# Patient Record
Sex: Female | Born: 1955
Health system: Southern US, Community
[De-identification: ages and names within clinical notes are randomized; demographics above are authoritative.]

## PROBLEM LIST (undated history)

## (undated) DIAGNOSIS — D693 Immune thrombocytopenic purpura: Secondary | ICD-10-CM

## (undated) DIAGNOSIS — E079 Disorder of thyroid, unspecified: Secondary | ICD-10-CM

## (undated) DIAGNOSIS — I2510 Atherosclerotic heart disease of native coronary artery without angina pectoris: Secondary | ICD-10-CM

## (undated) DIAGNOSIS — G4730 Sleep apnea, unspecified: Secondary | ICD-10-CM

## (undated) DIAGNOSIS — D124 Benign neoplasm of descending colon: Secondary | ICD-10-CM

## (undated) DIAGNOSIS — D123 Benign neoplasm of transverse colon: Secondary | ICD-10-CM

## (undated) DIAGNOSIS — D125 Benign neoplasm of sigmoid colon: Secondary | ICD-10-CM

## (undated) DIAGNOSIS — K219 Gastro-esophageal reflux disease without esophagitis: Secondary | ICD-10-CM

## (undated) DIAGNOSIS — R194 Change in bowel habit: Secondary | ICD-10-CM

## (undated) DIAGNOSIS — I10 Essential (primary) hypertension: Secondary | ICD-10-CM

## (undated) DIAGNOSIS — Z9289 Personal history of other medical treatment: Secondary | ICD-10-CM

## (undated) DIAGNOSIS — T884XXA Failed or difficult intubation, initial encounter: Secondary | ICD-10-CM

## (undated) DIAGNOSIS — M797 Fibromyalgia: Secondary | ICD-10-CM

## (undated) DIAGNOSIS — J189 Pneumonia, unspecified organism: Secondary | ICD-10-CM

## (undated) DIAGNOSIS — Z860100 Personal history of colon polyps, unspecified: Secondary | ICD-10-CM

## (undated) DIAGNOSIS — D120 Benign neoplasm of cecum: Secondary | ICD-10-CM

## (undated) DIAGNOSIS — I351 Nonrheumatic aortic (valve) insufficiency: Secondary | ICD-10-CM

## (undated) DIAGNOSIS — R519 Headache, unspecified: Secondary | ICD-10-CM

## (undated) DIAGNOSIS — Z8719 Personal history of other diseases of the digestive system: Secondary | ICD-10-CM

## (undated) DIAGNOSIS — E119 Type 2 diabetes mellitus without complications: Secondary | ICD-10-CM

## (undated) DIAGNOSIS — R011 Cardiac murmur, unspecified: Secondary | ICD-10-CM

## (undated) DIAGNOSIS — T8859XA Other complications of anesthesia, initial encounter: Secondary | ICD-10-CM

## (undated) DIAGNOSIS — M1990 Unspecified osteoarthritis, unspecified site: Secondary | ICD-10-CM

## (undated) DIAGNOSIS — S0990XA Unspecified injury of head, initial encounter: Secondary | ICD-10-CM

## (undated) DIAGNOSIS — E039 Hypothyroidism, unspecified: Secondary | ICD-10-CM

## (undated) DIAGNOSIS — D649 Anemia, unspecified: Secondary | ICD-10-CM

## (undated) DIAGNOSIS — Z87442 Personal history of urinary calculi: Secondary | ICD-10-CM

## (undated) DIAGNOSIS — D122 Benign neoplasm of ascending colon: Secondary | ICD-10-CM

## (undated) DIAGNOSIS — H269 Unspecified cataract: Secondary | ICD-10-CM

## (undated) DIAGNOSIS — I509 Heart failure, unspecified: Secondary | ICD-10-CM

## (undated) HISTORY — DX: Essential (primary) hypertension: I10

## (undated) HISTORY — DX: Gastro-esophageal reflux disease without esophagitis: K21.9

## (undated) HISTORY — DX: Benign neoplasm of sigmoid colon: D12.5

## (undated) HISTORY — PX: EYE SURGERY: SHX253

## (undated) HISTORY — PX: TONSILLECTOMY: SUR1361

## (undated) HISTORY — DX: Personal history of colon polyps, unspecified: Z86.0100

## (undated) HISTORY — DX: Benign neoplasm of descending colon: D12.4

## (undated) HISTORY — PX: FOOT SURGERY: SHX648

## (undated) HISTORY — DX: Disorder of thyroid, unspecified: E07.9

## (undated) HISTORY — DX: Benign neoplasm of ascending colon: D12.2

## (undated) HISTORY — DX: Unspecified cataract: H26.9

## (undated) HISTORY — DX: Benign neoplasm of transverse colon: D12.3

## (undated) HISTORY — DX: Unspecified osteoarthritis, unspecified site: M19.90

## (undated) HISTORY — DX: Change in bowel habit: R19.4

## (undated) HISTORY — PX: OTHER SURGICAL HISTORY: SHX169

## (undated) HISTORY — PX: ABDOMINAL HYSTERECTOMY: SHX81

## (undated) HISTORY — DX: Type 2 diabetes mellitus without complications: E11.9

## (undated) HISTORY — DX: Benign neoplasm of cecum: D12.0

## (undated) HISTORY — PX: PLANTAR FASCIA SURGERY: SHX746

## (undated) HISTORY — DX: Personal history of colonic polyps: Z86.010

## (undated) HISTORY — DX: Anemia, unspecified: D64.9

## (undated) HISTORY — PX: CHOLECYSTECTOMY: SHX55

## (undated) HISTORY — PX: APPENDECTOMY: SHX54

## (undated) HISTORY — PX: FRACTURE SURGERY: SHX138

---

## 1998-07-08 ENCOUNTER — Other Ambulatory Visit: Admission: RE | Admit: 1998-07-08 | Discharge: 1998-07-08 | Payer: Self-pay | Admitting: Obstetrics & Gynecology

## 1998-08-24 ENCOUNTER — Ambulatory Visit (HOSPITAL_BASED_OUTPATIENT_CLINIC_OR_DEPARTMENT_OTHER): Admission: RE | Admit: 1998-08-24 | Discharge: 1998-08-24 | Payer: Self-pay | Admitting: Orthopedic Surgery

## 2001-04-08 ENCOUNTER — Other Ambulatory Visit: Admission: RE | Admit: 2001-04-08 | Discharge: 2001-04-08 | Payer: Self-pay | Admitting: Obstetrics & Gynecology

## 2001-04-12 ENCOUNTER — Encounter: Payer: Self-pay | Admitting: Obstetrics & Gynecology

## 2001-04-12 ENCOUNTER — Encounter: Admission: RE | Admit: 2001-04-12 | Discharge: 2001-04-12 | Payer: Self-pay | Admitting: Obstetrics & Gynecology

## 2001-12-30 ENCOUNTER — Encounter: Payer: Self-pay | Admitting: Internal Medicine

## 2002-08-19 ENCOUNTER — Other Ambulatory Visit: Admission: RE | Admit: 2002-08-19 | Discharge: 2002-08-19 | Payer: Self-pay | Admitting: Obstetrics & Gynecology

## 2003-12-16 ENCOUNTER — Encounter: Payer: Self-pay | Admitting: Internal Medicine

## 2005-01-27 ENCOUNTER — Ambulatory Visit: Payer: Self-pay | Admitting: Internal Medicine

## 2005-02-03 ENCOUNTER — Ambulatory Visit: Payer: Self-pay | Admitting: Internal Medicine

## 2005-02-10 ENCOUNTER — Ambulatory Visit: Payer: Self-pay | Admitting: Internal Medicine

## 2005-02-10 ENCOUNTER — Encounter (INDEPENDENT_AMBULATORY_CARE_PROVIDER_SITE_OTHER): Payer: Self-pay | Admitting: Specialist

## 2005-07-21 ENCOUNTER — Other Ambulatory Visit: Admission: RE | Admit: 2005-07-21 | Discharge: 2005-07-21 | Payer: Self-pay | Admitting: Obstetrics and Gynecology

## 2010-01-10 ENCOUNTER — Encounter (INDEPENDENT_AMBULATORY_CARE_PROVIDER_SITE_OTHER): Payer: Self-pay | Admitting: *Deleted

## 2010-03-01 ENCOUNTER — Ambulatory Visit: Payer: Self-pay | Admitting: Internal Medicine

## 2010-03-16 ENCOUNTER — Telehealth: Payer: Self-pay | Admitting: Internal Medicine

## 2010-04-25 ENCOUNTER — Telehealth: Payer: Self-pay | Admitting: Internal Medicine

## 2010-04-26 ENCOUNTER — Encounter: Payer: Self-pay | Admitting: Internal Medicine

## 2010-05-06 ENCOUNTER — Encounter (INDEPENDENT_AMBULATORY_CARE_PROVIDER_SITE_OTHER): Payer: Self-pay | Admitting: *Deleted

## 2010-05-09 ENCOUNTER — Ambulatory Visit: Payer: Self-pay | Admitting: Internal Medicine

## 2010-05-13 ENCOUNTER — Ambulatory Visit: Payer: Self-pay | Admitting: Internal Medicine

## 2010-05-13 ENCOUNTER — Ambulatory Visit (HOSPITAL_COMMUNITY)
Admission: RE | Admit: 2010-05-13 | Discharge: 2010-05-13 | Payer: Self-pay | Source: Home / Self Care | Attending: Internal Medicine | Admitting: Internal Medicine

## 2010-05-16 ENCOUNTER — Encounter: Payer: Self-pay | Admitting: Internal Medicine

## 2010-07-05 NOTE — Procedures (Signed)
Summary: EGD   EGD  Procedure date:  12/16/2003  Findings:      Location: [facility]  Findings: Esophagitis  Findings: Stricture:  GERD   EGD  Procedure date:  12/16/2003  Findings:      Location: [facility]  Findings: Esophagitis  Findings: Stricture:  GERD  Patient Name: Joanna Alvarado, Joanna Alvarado MRN:  Procedure Procedures: Panendoscopy (EGD) CPT: 43235.    with esophageal dilation. CPT: G9296129.  Personnel: Endoscopist: Wilhemina Bonito. Marina Goodell, MD.  Exam Location: Exam performed in Outpatient Clinic. Outpatient  Patient Consent: Procedure, Alternatives, Risks and Benefits discussed, consent obtained, from patient. Consent was obtained by the RN.  Indications Symptoms: Dysphagia. Reflux symptoms  History  Current Medications: Patient is not currently taking Coumadin.  Pre-Exam Physical: Performed Dec 16, 2003  Entire physical exam was normal.  Exam Exam Info: Maximum depth of insertion Duodenum, intended Duodenum. Patient position: on left side. Vocal cords visualized. Gastric retroflexion performed. Images taken. ASA Classification: I. Tolerance: excellent.  Sedation Meds: Patient assessed and found to be appropriate for moderate (conscious) sedation. Fentanyl 100 mcg. given IV. Versed 10 mg. given IV. Cetacaine Spray given aerosolized.  Monitoring: BP and pulse monitoring done. Oximetry used. Supplemental O2 given  Fluoroscopy: Fluoroscopy was not used.  Findings STRICTURE / STENOSIS: Stricture in Distal Esophagus.  Constriction: partial. Etiology: benign due to reflux. 34 cm from mouth. Lumen diameter is 15 mm. ICD9: Esophageal Stricture: 530.3. Comment: Active esophagitis (erosive) .  - Dilation: Distal Esophagus. Maloney dilator used, Diameter: 54 F, No Resistance, No Heme present on extraction. 1  total dilators used. Patient tolerance excellent.  HIATAL HERNIA: Regular, 5 cms. in length.   Assessment Abnormal examination, see  findings above.  Diagnoses: 530.3: Esophageal Stricture.  530.11: Esophagitis, Reflux.  530.81: GERD.   Events  Unplanned Intervention: No unplanned interventions were required.  Unplanned Events: There were no complications. Plans Medication(s): PPI: Esomeprazole/Nexium 40 mg QD, for indefinitely.   Disposition: After procedure patient sent to recovery. After recovery patient sent home.  Scheduling: Office Visit, to [employer]. Marina Goodell, MD, in about 8 weeks    This report was created from the original endoscopy report, which was reviewed and signed by the above listed endoscopist.   cc:  The Patient

## 2010-07-05 NOTE — Miscellaneous (Signed)
Summary: recall colon--ch.  Clinical Lists Changes    Patient will have to be done at the hospital due to her weight.  Patty to schedule when Dr. Lamar Sprinkles new sschedule comes out.  Clide Cliff, RN

## 2010-07-05 NOTE — Progress Notes (Signed)
Summary: Needs colonoscopy at hospital  Phone Note Call from Patient Call back at [phone]   Caller: Patient Call For: Dr. Marina Goodell Reason for Call: Talk to Nurse Summary of Call: needs to sch'd hosp. COL Initial call taken by: Karna Christmas,  March 16, 2010 2:18 PM  Follow-up for Phone Call        Dr Marina Goodell this pt had a previsit but the colon will need to be done at [facility] due to her weight.   Can I put her on 03/31/10? Follow-up by: Chales Abrahams CMA Duncan Dull),  March 16, 2010 2:34 PM  Additional Follow-up for Phone Call Additional follow up Details #1::        no, my schedule this too full that week. Look toward by next hospital week, I think in December. Thanks Additional Follow-up by: Hilarie Fredrickson MD,  March 16, 2010 3:10 PM    Additional Follow-up for Phone Call Additional follow up Details #2::    pt aware I will call her when the Dec schedule is out. Follow-up by: Chales Abrahams CMA Duncan Dull),  March 16, 2010 3:16 PM

## 2010-07-05 NOTE — Progress Notes (Signed)
  Phone Note Outgoing Call   Call placed by: Milford Cage [place],  April 25, 2010 4:24 PM Call placed to: Patient Summary of Call: Called patient to see if December 9th is ok to schedule her Colon.  Left message for her to call me back. Milford Cage [facility]  April 25, 2010 4:24 PM   Follow-up for Phone Call        Pt. called and stated Dec. 9th is fine.  Scheduled at [facility] and pre-visit for Dec. 5th.  reminder letter sent.  Follow-up by: Milford Cage NCMA,  April 26, 2010 12:19 PM

## 2010-07-05 NOTE — Letter (Signed)
Summary: [facility] Instructions  [facility]  [address] [place], [place] [address]   Phone: [phone]  Fax: [phone]       Joanna Alvarado    Feb 05, 1956    MRN: [id number]        Procedure Day Dorna Bloom:  Farrell Ours  05/13/10     Arrival Time:  7:30AM     Procedure Time:  8:30AM     Location of Procedure:                    Juliann Pares  [facility] ( Outpatient Registration)                      PREPARATION FOR COLONOSCOPY WITH MOVIPREP   Starting 5 days prior to your procedure 05/08/10 do not eat nuts, seeds, popcorn, corn, beans, peas,  salads, or any raw vegetables.  Do not take any fiber supplements (e.g. Metamucil, Citrucel, and Benefiber).  THE DAY BEFORE YOUR PROCEDURE         DATE: 05/12/10   DAY: THURSDAY  1.  Drink clear liquids the entire day-NO SOLID FOOD  2.  Do not drink anything colored red or purple.  Avoid juices with pulp.  No orange juice.  3.  Drink at least 64 oz. (8 glasses) of fluid/clear liquids during the day to prevent dehydration and help the prep work efficiently.  CLEAR LIQUIDS INCLUDE: Water Jello Ice Popsicles Tea (sugar ok, no milk/cream) Powdered fruit flavored drinks Coffee (sugar ok, no milk/cream) Gatorade Juice: apple, white grape, white cranberry  Lemonade Clear bullion, consomm, broth Carbonated beverages (any kind) Strained chicken noodle soup Hard Candy                             4.  In the morning, mix first dose of MoviPrep solution:    Empty 1 Pouch A and 1 Pouch B into the disposable container    Add lukewarm drinking water to the top line of the container. Mix to dissolve    Refrigerate (mixed solution should be used within 24 hrs)  5.  Begin drinking the prep at 5:00 p.m. The MoviPrep container is divided by 4 marks.   Every 15 minutes drink the solution down to the next mark (approximately 8 oz) until the full liter is complete.   6.  Follow completed prep with 16 oz of clear liquid of your  choice (Nothing red or purple).  Continue to drink clear liquids until bedtime.  7.  Before going to bed, mix second dose of MoviPrep solution:    Empty 1 Pouch A and 1 Pouch B into the disposable container    Add lukewarm drinking water to the top line of the container. Mix to dissolve    Refrigerate  THE DAY OF YOUR PROCEDURE      DATE: 05/13/10   DAY: FRIDAY  Beginning at 3:30AM (5 hours before procedure):         1. Every 15 minutes, drink the solution down to the next mark (approx 8 oz) until the full liter is complete.  2. Follow completed prep with 16 oz. of clear liquid of your choice.    3. You may drink clear liquids until 4:30AM (4 HOURS BEFORE PROCEDURE).   MEDICATION INSTRUCTIONS  Unless otherwise instructed, you should take regular prescription medications with a small sip of water   as early as possible the  morning of your procedure.         OTHER INSTRUCTIONS  You will need a responsible adult at least 56 years of age to accompany you and drive you home.   This person must remain in the waiting room during your procedure.  Wear loose fitting clothing that is easily removed.  Leave jewelry and other valuables at home.  However, you may wish to bring a book to read or  an iPod/MP3 player to listen to music as you wait for your procedure to start.  Remove all body piercing jewelry and leave at home.  Total time from sign-in until discharge is approximately 2-3 hours.  You should go home directly after your procedure and rest.  You can resume normal activities the  day after your procedure.  The day of your procedure you should not:   Drive   Make legal decisions   Operate machinery   Drink alcohol   Return to work  You will receive specific instructions about eating, activities and medications before you leave.    The above instructions have been reviewed and explained to me by   Clide Cliff, RN______________________    I fully  understand and can verbalize these instructions _____________________________ Date _________

## 2010-07-05 NOTE — Procedures (Signed)
Summary: Colonoscopy   Colonoscopy  Procedure date:  12/30/2001  Findings:      Location:  [facility].  Results: Polyp.  Results: Hemorrhoids.     Results: Diverticulosis.         Procedures Next Due Date:    Colonoscopy: 01/2005 Patient Name: Joanna Alvarado, Joanna Alvarado MRN:  Procedure Procedures: Colonoscopy CPT: [address].    with polypectomy. CPT: A3573898.  Personnel: Endoscopist: Wilhemina Bonito. Marina Goodell, MD.  Referred By: Hall Busing, MD.  Exam Location: Exam performed in Outpatient Clinic. Outpatient  Patient Consent: Procedure, Alternatives, Risks and Benefits discussed, consent obtained, from patient. Consent was obtained by the RN.  Indications Symptoms: Hematochezia.  Increased Risk Screening: Family History of Polyps.  History  Pre-Exam Physical: Performed Dec 30, 2001. Entire physical exam was normal.  Exam Exam: Extent of exam reached: Ileum, extent intended: Ileum.  The cecum was identified by appendiceal orifice and IC valve. Patient position: on left side. Colon retroflexion performed. Images taken. ASA Classification: I. Tolerance: excellent.  Monitoring: Pulse and BP monitoring, Oximetry used. Supplemental O2 given.  Colon Prep Used Visicol for colon prep. Prep results: good.  Sedation Meds: Patient assessed and found to be appropriate for moderate (conscious) sedation. Fentanyl 125 mcg. given IV. Versed 12 given IV.  Findings POLYP: Transverse Colon, Maximum size: 3 mm. sessile polyp. Procedure:  snare without cautery, removed, not retrieved,  POLYP: Ascending Colon, Maximum size: 7 mm. sessile polyp. Procedure:  snare with cautery, The polyp was removed piece meal. removed, retrieved, Polyp sent to pathology.  - DIVERTICULOSIS: Sigmoid Colon. ICD9: Diverticulosis, Colon: 562.10.  POLYP: Sigmoid Colon, Maximum size: 4 mm. sessile polyp. Procedure:  snare with cautery, removed, not retrieved, Comments: polyp destroyed upon removal.    HEMORRHOIDS: Internal. Size: Grade II. ICD9: Hemorrhoids, Internal: 455.0.   Assessment Abnormal examination, see findings above.  Diagnoses: 562.10: Diverticulosis, Colon.  455.0: Hemorrhoids, Internal.  211.3: Colon Polyps.   Events  Unplanned Interventions: No intervention was required.  Unplanned Events: There were no complications. Plans Medication Plan: Fiber supplements: Psyllium 2 Tbsp QD, starting Dec 30, 2001  Hemorrhoidal Medications: Anusol Suppositories qhs prn   Disposition: After procedure patient sent to recovery. After recovery patient sent home.  Scheduling/Referral: Colonoscopy, to Wilhemina Bonito. Marina Goodell, MD, in 3 years,    This report was created from the original endoscopy report, which was reviewed and signed by the above listed endoscopist.   cc:  Hall Busing, MD      The Patient

## 2010-07-05 NOTE — Miscellaneous (Signed)
Summary: colonoscopy/[facility] 05/13/10/bs  Clinical Lists Changes  Medications: Added new medication of MOVIPREP 100 GM  SOLR (PEG-KCL-NACL-NASULF-NA ASC-C) As per prep instructions. - Signed Rx of MOVIPREP 100 GM  SOLR (PEG-KCL-NACL-NASULF-NA ASC-C) As per prep instructions.;  #1 x 0;  Signed;  Entered by: Clide Cliff RN;  Authorized by: Hilarie Fredrickson MD;  Method used: Electronically to [facility]  [id number], [place], [place]  [address], Ph: [phone] or [phone], Fax: [phone] Allergies: Added new allergy or adverse reaction of CODEINE Observations: Added new observation of NKA: F (05/09/2010 13:33)    Prescriptions: MOVIPREP 100 GM  SOLR (PEG-KCL-NACL-NASULF-NA ASC-C) As per prep instructions.  #1 x 0   Entered by:   Clide Cliff RN   Authorized by:   Hilarie Fredrickson MD   Signed by:   Clide Cliff RN on 05/09/2010   Method used:   [facility]ically to        [facility]  [id number] (retail)       [address]       [place], [place]  [address]       Ph: [phone] or [phone]       Fax: [phone]   RxID:   [id number]

## 2010-07-05 NOTE — Letter (Signed)
Summary: Previsit letter  [facility]  [address] [place], [place] [address]   Phone: [phone]  Fax: [phone]       01/10/2010 MRN: [id number]  [facility] [address] [place], [place]  [address]  Dear [name],  Welcome to the Gastroenterology Division at [facility].    You are scheduled to see a nurse for your pre-procedure visit on 03-01-10 at 10:00a.m. on the 3rd floor at [facility], [address].  We ask that you try to arrive at our office 15 minutes prior to your appointment time to allow for check-in.  Your nurse visit will consist of discussing your medical and surgical history, your immediate family medical history, and your medications.    Please bring a complete list of all your medications or, if you prefer, bring the medication bottles and we will list them.  We will need to be aware of both prescribed and over the counter drugs.  We will need to know exact dosage information as well.  If you are on blood thinners (Coumadin, Plavix, Aggrenox, Ticlid, etc.) please call our office today/prior to your appointment, as we need to consult with your physician about holding your medication.   Please be prepared to read and sign documents such as consent forms, a financial agreement, and acknowledgement forms.  If necessary, and with your consent, a friend or relative is welcome to sit-in on the nurse visit with you.  Please bring your insurance card so that we may make a copy of it.  If your insurance requires a referral to see a specialist, please bring your referral form from your primary care physician.  No co-pay is required for this nurse visit.     If you cannot keep your appointment, please call [phone] to cancel or reschedule prior to your appointment date.  This allows [place] the opportunity to schedule an appointment for another patient in need of care.    Thank you for choosing [facility] for your medical  needs.  We appreciate the opportunity to care for you.  Please visit [place] at our website  to learn more about our practice.                     Sincerely.                                                                                                                   The Gastroenterology Division

## 2010-07-05 NOTE — Procedures (Signed)
Summary: Colonoscopy   Colonoscopy  Procedure date:  02/10/2005  Findings:      Location:  [facility].  Results: Diverticulosis.       Results: Hemorrhoids.     Results: Polyp. Adenomatous and Hyperplastic  Patient Name: Joanna Alvarado, Joanna Alvarado MRN:  Procedure Procedures: Colonoscopy CPT: 5072027333.    with polypectomy. CPT: A3573898.  Personnel: Endoscopist: Wilhemina Bonito. Marina Goodell, MD.  Exam Location: Exam performed in Outpatient Clinic. Outpatient  Patient Consent: Procedure, Alternatives, Risks and Benefits discussed, consent obtained, from patient. Consent was obtained by the RN.  Indications Symptoms: Abdominal pain / bloating.  Surveillance of: Adenomatous Polyp(s). This is an initial surveillance exam. Initial polypectomy was performed in 2003. in Jul. 3 or more Polyps were found at Index Exam. Largest polyp removed was 6 to 9 mm. Prior polyp located in both proximal and distal colon. Pathology of worst  polyp: tubular adenoma.  History  Current Medications: Patient is not currently taking Coumadin.  Pre-Exam Physical: Performed Feb 10, 2005. Entire physical exam was normal.  Exam Exam: Extent of exam reached: Cecum, extent intended: Cecum.  The cecum was identified by appendiceal orifice and IC valve. Patient position: on left side. Colon retroflexion performed. Images taken. ASA Classification: I. Tolerance: excellent.  Monitoring: Pulse and BP monitoring, Oximetry used. Supplemental O2 given.  Colon Prep Used Miralax for colon prep. Prep results: excellent.  Sedation Meds: Patient assessed and found to be appropriate for moderate (conscious) sedation. Fentanyl 100 mcg. given IV. Versed 10 mg. given IV.  Findings - MULTIPLE POLYPS: Cecum to Ascending Colon. minimum size 3 mm, maximum size 15 mm. Procedure:  snare with cautery, removed, Polyp retrieved, 6 polyps Polyps sent to pathology. ICD9: Colon Polyps: 211.3. Comments: polyps 3,5,5,13mm cold snare.  10,59mm  w/cautery.  NORMAL EXAM: Cecum to Rectum.  - DIVERTICULOSIS: Sigmoid Colon. ICD9: Diverticulosis, Colon: 562.10.   Assessment Abnormal examination, see findings above.  Diagnoses: 562.10: Diverticulosis, Colon.  211.3: Colon Polyps.  455.0: Hemorrhoids, Internal. small.   Events  Unplanned Interventions: No intervention was required.  Unplanned Events: There were no complications. Plans  Post Exam Instructions: No aspirin or non-steroidal containing medications: 2 weeks.  Disposition: After procedure patient sent to recovery. After recovery patient sent home.  Scheduling/Referral: Colonoscopy, to Wilhemina Bonito. Marina Goodell, MD, in 2 years,    This report was created from the original endoscopy report, which was reviewed and signed by the above listed endoscopist.   cc:  Hall Busing, MD      The Patient

## 2010-07-05 NOTE — Procedures (Signed)
Summary: EGD   EGD  Procedure date:  02/10/2005  Findings:      Location: [facility]  Findings: Esophagitis  Findings: Stricture:  GERD  EGD  Procedure date:  02/10/2005  Findings:      Location: [facility]  Findings: Esophagitis  Findings: Stricture:  GERD Patient Name: Joanna Alvarado, Joanna Alvarado MRN:  Procedure Procedures: Panendoscopy (EGD) CPT: 43235.  Personnel: Endoscopist: Wilhemina Bonito. Marina Goodell, MD.  Exam Location: Exam performed in Outpatient Clinic. Outpatient  Patient Consent: Procedure, Alternatives, Risks and Benefits discussed, consent obtained, from patient. Consent was obtained by the RN.  Indications Symptoms: Abdominal pain, location: RUQ.  History  Current Medications: Patient is not currently taking Coumadin.  Pre-Exam Physical: Performed Feb 10, 2005  Entire physical exam was normal.  Exam Exam Info: Maximum depth of insertion Duodenum, intended Duodenum. Patient position: on left side. Vocal cords visualized. Gastric retroflexion performed. Images taken. ASA Classification: I. Tolerance: excellent.  Sedation Meds: Patient assessed and found to be appropriate for moderate (conscious) sedation. Residual sedation present from prior procedure today. Fentanyl 50 mcg. given IV. Versed 5 mg. given IV.  Monitoring: BP and pulse monitoring done. Oximetry used. Supplemental O2 given  Findings ESOPHAGEAL INFLAMMATION: as a result of reflux. Severity is moderate, erosions present.  Length of inflammation: 2 cm. [place] Classification: Grade A. ICD9: Esophagitis, Reflux: 530.11. Comments:  16mm stricture @ 37cm .  HIATAL HERNIA: 4 cms. in length.  - Normal: Fundus to Duodenal Apex.   Assessment Abnormal examination, see findings above.  Diagnoses: 530.11: Esophagitis, Reflux.  530.3: Esophageal Stricture.  530.81: GERD.   Events  Unplanned Intervention: No unplanned interventions were required.  Unplanned Events: There  were no complications. Plans Medication(s): PPI: Esomeprazole/Nexium 40 mg BID,   Disposition: After procedure patient sent to recovery. After recovery patient sent home.  Comments: Refer to General surgery "Symptomatic cholelithiasis, for lap chole"  This report was created from the original endoscopy report, which was reviewed and signed by the above listed endoscopist.   cc:  Hall Busing, MD      The Patient      CCS

## 2010-07-05 NOTE — Letter (Signed)
Summary: Pre Visit Letter Revised  [facility]  [address] [place], [place] [address]   Phone: [phone]  Fax: [phone]        04/26/2010 MRN: [id number] [facility] [address] [place], [place]  [address]             Procedure Date:  05/13/10 8:30 am                         Welcome to the Gastroenterology Division at [facility].    You are scheduled to see a nurse for your pre-procedure visit on 05/09/10 at 1:30 on the 3rd floor at [facility], [address].  We ask that you try to arrive at our office 15 minutes prior to your appointment time to allow for check-in.  Please take a minute to review the attached form.  If you answer "Yes" to one or more of the questions on the first page, we ask that you call the person listed at your earliest opportunity.  If you answer "No" to all of the questions, please complete the rest of the form and bring it to your appointment.    Your nurse visit will consist of discussing your medical and surgical history, your immediate family medical history, and your medications.   If you are unable to list all of your medications on the form, please bring the medication bottles to your appointment and we will list them.  We will need to be aware of both prescribed and over the counter drugs.  We will need to know exact dosage information as well.    Please be prepared to read and sign documents such as consent forms, a financial agreement, and acknowledgement forms.  If necessary, and with your consent, a friend or relative is welcome to sit-in on the nurse visit with you.  Please bring your insurance card so that we may make a copy of it.  If your insurance requires a referral to see a specialist, please bring your referral form from your primary care physician.  No co-pay is required for this nurse visit.     If you cannot keep your appointment, please call [phone] to cancel or reschedule prior to your  appointment date.  This allows [place] the opportunity to schedule an appointment for another patient in need of care.    Thank you for choosing [facility] for your medical needs.  We appreciate the opportunity to care for you.  Please visit [place] at our website  to learn more about our practice.  Sincerely, The Gastroenterology Division

## 2010-07-07 NOTE — Letter (Signed)
Summary: Patient Notice- Polyp Results  [facility]  [address] [place], [place] [address]   Phone: [phone]  Fax: [phone]        May 16, 2010 MRN: [id number]    [facility] [address] [place], [place]  [address]    Dear Ms. Bachmeier,  I am pleased to inform you that the colon polyp(s) removed during your recent colonoscopy was (were) found to be benign (no cancer detected) upon pathologic examination.  I recommend you have a repeat colonoscopy examination in 5 years to look for recurrent polyps, as having colon polyps increases your risk for having recurrent polyps or even colon cancer in the future.  Should you develop new or worsening symptoms of abdominal pain, bowel habit changes or bleeding from the rectum or bowels, please schedule an evaluation with either your primary care physician or with me.  Additional information/recommendations:  __ No further action with gastroenterology is needed at this time. Please      follow-up with your primary care physician for your other healthcare      needs.   Please call us if you are having persistent problems or have questions about your condition that have not been fully answered at this time.  Sincerely,  Hilarie Fredrickson MD  This letter has been electronically signed by your physician.  Appended Document: Patient Notice- Polyp Results letter mailed to patient's home

## 2010-07-07 NOTE — Procedures (Signed)
Summary: Colonoscopy  Patient: Joanna Alvarado Note: All result statuses are Final unless otherwise noted.  Tests: (1) Colonoscopy (COL)   COL Colonoscopy           DONE     [facility]     [address] [place], [place]  [address]           COLONOSCOPY PROCEDURE REPORT           PATIENT:  Joanna Alvarado, Joanna Alvarado  MR#:  [id number]     BIRTHDATE:  03-21-1956, 54 yrs. old  GENDER:  female     ENDOSCOPIST:  Wilhemina Bonito. Eda Keys, MD     REF. BY:  Surveillance Program Recall,     PROCEDURE DATE:  05/13/2010     PROCEDURE:  Colonoscopy with snare polypectomy x 3     ASA CLASS:  Class III     INDICATIONS:  history of pre-cancerous (adenomatous) colon polyps,     surveillance and high-risk screening ; index exam 2003 w/ mult.     TA's, f/u 2006 w/ mult TA's     MEDICATIONS:   Fentanyl 100 mcg IV, Versed 10 mg IV           DESCRIPTION OF PROCEDURE:   After the risks benefits and     alternatives of the procedure were thoroughly explained, informed     consent was obtained.  Digital rectal exam was performed and     revealed no abnormalities.   The Pentax Ped Colon [id number]     endoscope was introduced through the anus and advanced to the     cecum, which was identified by both the appendix and ileocecal     valve, without limitations.Time to cecum = 3 min. The quality of     the prep was excellent, using MoviPrep.  The instrument was then     slowly withdrawn (time = 15 min) as the colon was fully examined.     <<PROCEDUREIMAGES>>           FINDINGS:  Three polyps, < 6mm,  were found in the ascending     colon. Polyps were snared without cautery. Retrieval was     successful.   Mild diverticulosis was found in the left colon.     Retroflexed views in the rectum revealed no abnormalities.    The     scope was then withdrawn from the patient and the procedure     completed.           COMPLICATIONS:  None     ENDOSCOPIC IMPRESSION:     1) Three polyps in the ascending colon - removed     2) Mild diverticulosis in the left colon           RECOMMENDATIONS:     1) Follow up colonoscopy in 3 years if all adenomas; otherwise 5     years     ______________________________     Wilhemina Bonito. Eda Keys, MD           CC:  Ronney Asters, MD;   The Patient           n.     eSIGNED:   Wilhemina Bonito. Eda Keys at 05/13/2010 10:03 AM           Saunders Glance, [id number]  Note: An exclamation mark (!) indicates a result that was not dispersed into the flowsheet. Document Creation Date: 05/13/2010 10:03 AM _______________________________________________________________________  (1) Order  result status: Final Collection or observation date-time: 05/13/2010 09:50 Requested date-time:  Receipt date-time:  Reported date-time:  Referring Physician:   Ordering Physician: Fransico Setters [phone]) Specimen Source:  Source: Launa Grill Order Number: [id number] Lab site:

## 2012-11-21 ENCOUNTER — Encounter: Payer: Self-pay | Admitting: Physical Medicine & Rehabilitation

## 2012-12-02 ENCOUNTER — Ambulatory Visit (HOSPITAL_BASED_OUTPATIENT_CLINIC_OR_DEPARTMENT_OTHER): Payer: Managed Care, Other (non HMO) | Admitting: Physical Medicine & Rehabilitation

## 2012-12-02 ENCOUNTER — Encounter: Payer: Managed Care, Other (non HMO) | Attending: Physical Medicine & Rehabilitation

## 2012-12-02 ENCOUNTER — Encounter: Payer: Self-pay | Admitting: Physical Medicine & Rehabilitation

## 2012-12-02 VITALS — BP 141/76 | HR 67 | Resp 16 | Ht 68.0 in | Wt 376.0 lb

## 2012-12-02 DIAGNOSIS — G8928 Other chronic postprocedural pain: Secondary | ICD-10-CM

## 2012-12-02 DIAGNOSIS — IMO0001 Reserved for inherently not codable concepts without codable children: Secondary | ICD-10-CM

## 2012-12-02 DIAGNOSIS — M79609 Pain in unspecified limb: Secondary | ICD-10-CM | POA: Insufficient documentation

## 2012-12-02 DIAGNOSIS — M546 Pain in thoracic spine: Secondary | ICD-10-CM | POA: Insufficient documentation

## 2012-12-02 DIAGNOSIS — M545 Low back pain, unspecified: Secondary | ICD-10-CM | POA: Insufficient documentation

## 2012-12-02 DIAGNOSIS — R209 Unspecified disturbances of skin sensation: Secondary | ICD-10-CM | POA: Insufficient documentation

## 2012-12-02 DIAGNOSIS — I10 Essential (primary) hypertension: Secondary | ICD-10-CM | POA: Insufficient documentation

## 2012-12-02 DIAGNOSIS — M25579 Pain in unspecified ankle and joints of unspecified foot: Secondary | ICD-10-CM | POA: Insufficient documentation

## 2012-12-02 DIAGNOSIS — Z885 Allergy status to narcotic agent status: Secondary | ICD-10-CM | POA: Insufficient documentation

## 2012-12-02 DIAGNOSIS — G894 Chronic pain syndrome: Secondary | ICD-10-CM | POA: Insufficient documentation

## 2012-12-02 DIAGNOSIS — M25559 Pain in unspecified hip: Secondary | ICD-10-CM | POA: Insufficient documentation

## 2012-12-02 HISTORY — DX: Unspecified disturbances of skin sensation: R20.9

## 2012-12-02 HISTORY — DX: Reserved for inherently not codable concepts without codable children: IMO0001

## 2012-12-02 HISTORY — DX: Other chronic postprocedural pain: G89.28

## 2012-12-02 NOTE — Patient Instructions (Addendum)
Electromyography (EMG) Test This is a test in which very small electrodes are placed into your muscle tissue. It looks at the electrical impulses of your muscle tissue. This test is used to determine whether or not there are involuntary or spontaneous muscle movements. Involuntary or spontaneous means muscle movements that happen by themselves. This may indicate injury or disease of the nerves which supply that muscle. PREPARATION FOR TEST No preparation or fasting is necessary. Some stimulants such as caffeine and tobacco may need to be avoided for 2-3 hours before test or as instructed by your caregiver. NORMAL FINDINGS No evidence of neuromuscular abnormalities. Ranges for normal findings may vary among different laboratories and hospitals. You should always check with your doctor after having lab work or other tests done to discuss the meaning of your test results and whether your values are considered within normal limits. MEANING OF TEST  Your caregiver will go over the test results with you and discuss the importance and meaning of your results, as well as treatment options and the need for additional tests if necessary. OBTAINING THE TEST RESULTS It is your responsibility to obtain your test results. Ask the lab or department performing the test when and how you will get your results. Document Released: 09/22/2004 Document Revised: 08/14/2011 Document Reviewed: 05/01/2008 [employer] Patient Information 2014 [employer].  

## 2012-12-02 NOTE — Progress Notes (Signed)
Subjective:    Patient ID: Joanna Alvarado, female    DOB: 01/08/1956, 57 y.o.   MRN: [id number]  HPI Left foot tendon rupture in 2007, worker comp injury Had subtalar breakdown and hindfoot fusion Has Left ant ankle and distal leg pain Also complains of right hip pain area this also worsens with walking/standing more than 10 minutes   Tried lyrica in 2007-2008, also not walking and gained 100lb  Has been evaluated by multiple orthopedic surgeons. [facility]  10 minute walking tolerance then needs to sit.    Pain Inventory Average Pain 6 Pain Right Now 8 My pain is constant, sharp, burning, stabbing and tingling  In the last 24 hours, has pain interfered with the following? General activity 10 Relation with others 10 Enjoyment of life 10 What TIME of day is your pain at its worst? night Sleep (in general) Poor  Pain is worse with: walking and standing Pain improves with: medication Relief from Meds: 5  Mobility walk without assistance how many minutes can you walk? 10 ability to climb steps?  yes do you drive?  yes Do you have any goals in this area?  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  no  Neuro/Psych bladder control problems numbness trouble walking dizziness depression anxiety  Prior Studies bone scan x-rays CT/MRI  Physicians involved in your care Orthopedist Dr Doristine Section   Family History  Problem Relation Age of Onset  . Heart disease Mother   . Diabetes Mother   . Hypertension Mother   . Hypertension Father    History   Social History  . Marital Status: Married    Spouse Name: N/A    Number of Children: N/A  . Years of Education: N/A   Social History Main Topics  . Smoking status: Never Smoker   . Smokeless tobacco: Never Used  . Alcohol Use: No  .  Drug Use: No  . Sexually Active: None   Other Topics Concern  . None   Social History Narrative  . None   Past Surgical History  Procedure Laterality Date  . Tonsillectomy    . Appendectomy    . Abdominal hysterectomy    . Cholecystectomy    . Other surgical history      Triple orthosesis with tendon release   Past Medical History  Diagnosis Date  . Thyroid disease   . Hypertension    BP 141/76  Pulse 67  Resp 16  Ht 5\' 8"  (1.727 m)  Wt 376 lb (170.552 kg)  BMI 57.18 kg/m2  SpO2 96%     Review of Systems  Constitutional: Positive for diaphoresis and unexpected weight change.  Respiratory: Positive for apnea.   Genitourinary: Positive for difficulty urinating.  Musculoskeletal: Positive for gait problem.  Neurological: Positive for dizziness and numbness.  Psychiatric/Behavioral: Positive for dysphoric mood. The patient is nervous/anxious.   All other systems reviewed and are negative.       Objective:   Physical Exam  Musculoskeletal:  Left ankle dorsiflexion at 15 Right ankle dorsiflexion 10 Left ankle plantar flexion 15 Right ankle plantar flexion 30  Knee extension 180 bilateral  Normal quad strength bilateral 5/5 ankle dorsiflexion bilateral 2- left ankle Evertor strength 5/5 right ankle evertor 3+/5 left ankle plantarflexed and 5/5 right ankle plantarflexion 4/5 right hip  flexor 5/5 left hip flexor  Tenderness palpation thoracic paraspinals multiple level Tenderness lumbosacral paraspinals very light palpation Tenderness right hip very light palpation  Tingling parasthesia pinprick 5th toe left foot  Left foot MTP 77.9 Right foot MTP 79.7    No hypersensitivity in the feet or legs  Lumbar range of motion is normal Hip range of motion is normal Knee range of motion is normal Ambulation is wide based and waddling related to body habitus       Assessment & Plan:  1.  Fibromyalgia causing diffuse tenderness thoracolumbar and  hip. Does well with Cymbalta and Lyrica  2.  Chronic post op pain after L peroneal tendon rupture, and hindfoot fusion, may have neurogenic component.  Sural nerve may be involved, recommend check EMG/NCV, continue Lidoderm patch  3. Chronic pain syndrome she has allergic reactions to hydrocodone and  Codeine, will avoid narcotic analgesics. Doing reasonably well with nonsteroidal anti-inflammatory. No contraindications. No evidence of reflex sympathetic dystrophy.  Over half of the 45 min visit was spent counseling and coordinating care.

## 2012-12-03 NOTE — Addendum Note (Signed)
Addended by: Doreene Eland on: 12/03/2012 08:16 AM   Modules accepted: Orders

## 2012-12-30 ENCOUNTER — Encounter: Payer: Self-pay | Admitting: Physical Medicine & Rehabilitation

## 2013-01-09 ENCOUNTER — Other Ambulatory Visit: Payer: Self-pay | Admitting: Orthopedic Surgery

## 2013-01-09 DIAGNOSIS — M545 Low back pain: Secondary | ICD-10-CM

## 2013-01-10 ENCOUNTER — Ambulatory Visit: Payer: Managed Care, Other (non HMO) | Admitting: Physical Medicine & Rehabilitation

## 2013-01-14 ENCOUNTER — Ambulatory Visit
Admission: RE | Admit: 2013-01-14 | Discharge: 2013-01-14 | Disposition: A | Discharge status: Home / Self Care | Payer: Managed Care, Other (non HMO) | Source: Ambulatory Visit | Attending: Orthopedic Surgery | Admitting: Orthopedic Surgery

## 2013-01-14 DIAGNOSIS — M545 Low back pain: Secondary | ICD-10-CM

## 2013-04-10 ENCOUNTER — Other Ambulatory Visit: Payer: Self-pay

## 2013-06-03 ENCOUNTER — Encounter (HOSPITAL_COMMUNITY): Payer: Self-pay | Admitting: Emergency Medicine

## 2013-06-03 ENCOUNTER — Emergency Department (HOSPITAL_COMMUNITY)
Admission: EM | Admit: 2013-06-03 | Discharge: 2013-06-03 | Disposition: A | Discharge status: Home / Self Care | Payer: Managed Care, Other (non HMO) | Attending: Emergency Medicine | Admitting: Emergency Medicine

## 2013-06-03 DIAGNOSIS — E079 Disorder of thyroid, unspecified: Secondary | ICD-10-CM | POA: Insufficient documentation

## 2013-06-03 DIAGNOSIS — Z885 Allergy status to narcotic agent status: Secondary | ICD-10-CM | POA: Insufficient documentation

## 2013-06-03 DIAGNOSIS — M25552 Pain in left hip: Secondary | ICD-10-CM

## 2013-06-03 DIAGNOSIS — I10 Essential (primary) hypertension: Secondary | ICD-10-CM | POA: Insufficient documentation

## 2013-06-03 DIAGNOSIS — M79609 Pain in unspecified limb: Secondary | ICD-10-CM

## 2013-06-03 DIAGNOSIS — M545 Low back pain, unspecified: Secondary | ICD-10-CM | POA: Insufficient documentation

## 2013-06-03 DIAGNOSIS — G8929 Other chronic pain: Secondary | ICD-10-CM | POA: Insufficient documentation

## 2013-06-03 DIAGNOSIS — Z79899 Other long term (current) drug therapy: Secondary | ICD-10-CM | POA: Insufficient documentation

## 2013-06-03 DIAGNOSIS — M25559 Pain in unspecified hip: Secondary | ICD-10-CM | POA: Insufficient documentation

## 2013-06-03 MED ORDER — HYDROCODONE-ACETAMINOPHEN 5-325 MG PO TABS: 1.0000 | ORAL_TABLET | ORAL | Status: DC | PRN

## 2013-06-03 NOTE — ED Notes (Signed)
Per vascular patient negative for blood clot.  Pt has hip pain

## 2013-06-03 NOTE — ED Provider Notes (Addendum)
CSN: [id number]     Arrival date & time 06/03/13  1446 History   First MD Initiated Contact with Patient 06/03/13 1818     Chief Complaint  Patient presents with  . Hip Pain   (Consider location/radiation/quality/duration/timing/severity/associated sxs/prior Treatment) HPI Comments: Joanna Alvarado is a 57 y.o. Female who presents for evaluation of left hip pain. She saw her orthopedist several days ago for the same problem and was put on meloxicam. It has not helped yet. Her orthopedist also told her to see a vascular surgeon because of discoloration in her left lower leg. She is going to see her PCP, to get that referral, but has not done that yet. She has chronic low back pain and has had injections in the past. Her left hip pain is persistent, but waxes and wanes depending on her body movement, and body position. She denies fever, chills, nausea or vomiting, weakness, dizziness, paresthesias, or new problems with her gait. She has chronic disability or gait, secondary to left foot pain. There are no other known modifying factors.   Patient is a 57 y.o. female presenting with hip pain. The history is provided by the patient.  Hip Pain    Past Medical History  Diagnosis Date  . Thyroid disease   . Hypertension    Past Surgical History  Procedure Laterality Date  . Tonsillectomy    . Appendectomy    . Abdominal hysterectomy    . Cholecystectomy    . Other surgical history      Triple orthosesis with tendon release   Family History  Problem Relation Age of Onset  . Heart disease Mother   . Diabetes Mother   . Hypertension Mother   . Hypertension Father    History  Substance Use Topics  . Smoking status: Never Smoker   . Smokeless tobacco: Never Used  . Alcohol Use: No   OB History   Grav Para Term Preterm Abortions TAB SAB Ect Mult Living                 Review of Systems  All other systems reviewed and are negative.    Allergies  Codeine and  Hydrocodone-acetaminophen  Home Medications   Current Outpatient Rx  Name  Route  Sig  Dispense  Refill  . DULoxetine (CYMBALTA) 60 MG capsule   Oral   Take 60 mg by mouth daily.          Marland Kitchen levothyroxine (SYNTHROID, LEVOTHROID) 88 MCG tablet   Oral   Take 88 mcg by mouth daily before breakfast.          . lidocaine (LIDODERM) 5 %   Transdermal   Place 1 patch onto the skin every 12 (twelve) hours. Remove & Discard patch within 12 hours or as directed by MD         . losartan-hydrochlorothiazide (HYZAAR) 50-12.5 MG per tablet   Oral   Take 1 tablet by mouth daily.          . meloxicam (MOBIC) 15 MG tablet   Oral   Take 15 mg by mouth daily.         Marland Kitchen omeprazole (PRILOSEC) 20 MG capsule   Oral   Take 20 mg by mouth daily.         Marland Kitchen HYDROcodone-acetaminophen (NORCO) 5-325 MG per tablet   Oral   Take 1 tablet by mouth every 4 (four) hours as needed.   20 tablet   0  BP 133/41  Pulse 89  Temp(Src) 98.9 F (37.2 C) (Oral)  Resp 20  Wt 370 lb (167.831 kg)  SpO2 98% Physical Exam  Nursing note and vitals reviewed. Constitutional: She is oriented to person, place, and time. She appears well-developed.  Morbidly obese  HENT:  Head: Normocephalic and atraumatic.  Eyes: Conjunctivae and EOM are normal. Pupils are equal, round, and reactive to light.  Neck: Normal range of motion and phonation normal. Neck supple.  Cardiovascular: Normal rate, regular rhythm and intact distal pulses.   Strong left dorsalis pedis pulse  Pulmonary/Chest: Effort normal and breath sounds normal. She exhibits no tenderness.  Abdominal: Soft. She exhibits no distension. There is no tenderness. There is no guarding.  Musculoskeletal: Normal range of motion.  Mild left buttock, tenderness, and left hip pain with passive range of motion. Mild left lumbar tenderness to palpation. She is able to sit up in stretcher and actually reports decreased pain in this position.  Neurological:  She is alert and oriented to person, place, and time. She exhibits normal muscle tone.  Skin: Skin is warm and dry.  Indistinct petechial rash, left medial ankle. No other evident rash.   Psychiatric: She has a normal mood and affect. Her behavior is normal. Judgment and thought content normal.    ED Course  Procedures (including critical care time)  Duplex imaging, left leg is negative for DVT  Findings discussed with patient and husband, and all questions were answered   Labs Review Labs Reviewed - No data to display Imaging Review No results found.  EKG Interpretation   None       MDM   1. Left hip pain      Nonspecific left hip pain. With reassuring evaluation in the emergency department. Indistinct left ankle rash does not have any associated symptoms of concern, such as fever, chills, nausea, vomiting, weakness, hypertension, or generalized rash. There is no sign of arterial insufficiency or deep venous thrombosis.  The patient appears reasonably screened and/or stabilized for discharge and I doubt any other medical condition or other [facility] requiring further screening, evaluation, or treatment in the ED at this time prior to discharge.  Nursing Notes Reviewed/ Care Coordinated, and agree without changes. Applicable Imaging Reviewed.  Interpretation of Laboratory Data incorporated into ED treatment   Plan: Home Medications- hydrocodone, Benadryl prn itching; Home Treatments and Observation- rest, heat; return here if the recommended treatment, does not improve the symptoms; Recommended follow up- PCP, one week      Flint Melter, MD 06/03/13 1911  Flint Melter, MD 06/03/13 [id number]

## 2013-06-03 NOTE — Progress Notes (Signed)
VASCULAR LAB PRELIMINARY  PRELIMINARY  PRELIMINARY  PRELIMINARY  Left lower extremity venous duplex completed.    Preliminary report:  Left:  No evidence of DVT, superficial thrombosis, or Baker's cyst.  Antwione Picotte, RVT 06/03/2013, 4:15 PM

## 2013-06-03 NOTE — ED Notes (Signed)
Pt in c/o left hip pain, states the pain has been going on since christmas eve, went to her PMD and they referred her to a vascular specialist due to redness in her lower leg, states they told her to come here for a doppler, states over christmas she was also sick with vomiting and a fever, fever has since resolved, redness noted to left lower leg

## 2014-03-20 ENCOUNTER — Other Ambulatory Visit: Payer: Self-pay

## 2014-04-24 DIAGNOSIS — H2510 Age-related nuclear cataract, unspecified eye: Secondary | ICD-10-CM

## 2014-04-24 HISTORY — DX: Age-related nuclear cataract, unspecified eye: H25.10

## 2015-06-10 ENCOUNTER — Encounter: Payer: Self-pay | Admitting: Internal Medicine

## 2015-07-02 ENCOUNTER — Telehealth: Payer: Self-pay | Admitting: Internal Medicine

## 2015-07-07 ENCOUNTER — Other Ambulatory Visit: Payer: Self-pay

## 2015-07-07 DIAGNOSIS — Z1211 Encounter for screening for malignant neoplasm of colon: Secondary | ICD-10-CM

## 2015-07-07 NOTE — Telephone Encounter (Signed)
Recall colon scheduled at [facility] 07/27/15@8 :30am. Pt aware of appt. Previsit scheduled 07/20/15 @10 :30am. Pt aware of appts.

## 2015-07-07 NOTE — Telephone Encounter (Signed)
Patient calling back regarding this. [phone].

## 2015-07-12 ENCOUNTER — Telehealth: Payer: Self-pay

## 2015-07-12 NOTE — Telephone Encounter (Signed)
Scheduled @ [facility]

## 2015-07-14 ENCOUNTER — Encounter (HOSPITAL_COMMUNITY): Payer: Self-pay | Admitting: *Deleted

## 2015-07-20 ENCOUNTER — Ambulatory Visit (AMBULATORY_SURGERY_CENTER): Payer: Self-pay

## 2015-07-20 VITALS — Ht 67.5 in | Wt 363.8 lb

## 2015-07-20 DIAGNOSIS — K6389 Other specified diseases of intestine: Secondary | ICD-10-CM

## 2015-07-20 DIAGNOSIS — Z86010 Personal history of colonic polyps: Secondary | ICD-10-CM

## 2015-07-20 MED ORDER — NA SULFATE-K SULFATE-MG SULF 17.5-3.13-1.6 GM/177ML PO SOLN: ORAL | Status: DC

## 2015-07-20 NOTE — Progress Notes (Signed)
Per pt, no allergies to soy or egg products.Pt not taking any weight loss meds or using  O2 at home. 

## 2015-07-27 ENCOUNTER — Ambulatory Visit (HOSPITAL_COMMUNITY): Payer: PPO | Admitting: Anesthesiology

## 2015-07-27 ENCOUNTER — Encounter (HOSPITAL_COMMUNITY)
Admission: RE | Disposition: A | Discharge status: Home / Self Care | Payer: Self-pay | Source: Ambulatory Visit | Attending: Internal Medicine

## 2015-07-27 ENCOUNTER — Ambulatory Visit (HOSPITAL_COMMUNITY)
Admission: RE | Admit: 2015-07-27 | Discharge: 2015-07-27 | Disposition: A | Discharge status: Home / Self Care | Payer: PPO | Source: Ambulatory Visit | Attending: Internal Medicine | Admitting: Internal Medicine

## 2015-07-27 ENCOUNTER — Encounter (HOSPITAL_COMMUNITY): Payer: Self-pay

## 2015-07-27 DIAGNOSIS — I10 Essential (primary) hypertension: Secondary | ICD-10-CM | POA: Diagnosis not present

## 2015-07-27 DIAGNOSIS — G4730 Sleep apnea, unspecified: Secondary | ICD-10-CM | POA: Insufficient documentation

## 2015-07-27 DIAGNOSIS — Z1211 Encounter for screening for malignant neoplasm of colon: Secondary | ICD-10-CM | POA: Insufficient documentation

## 2015-07-27 DIAGNOSIS — Z8371 Family history of colonic polyps: Secondary | ICD-10-CM | POA: Insufficient documentation

## 2015-07-27 DIAGNOSIS — D122 Benign neoplasm of ascending colon: Secondary | ICD-10-CM | POA: Diagnosis not present

## 2015-07-27 DIAGNOSIS — Z860100 Personal history of colon polyps, unspecified: Secondary | ICD-10-CM | POA: Insufficient documentation

## 2015-07-27 DIAGNOSIS — D124 Benign neoplasm of descending colon: Secondary | ICD-10-CM

## 2015-07-27 DIAGNOSIS — E039 Hypothyroidism, unspecified: Secondary | ICD-10-CM | POA: Insufficient documentation

## 2015-07-27 DIAGNOSIS — D123 Benign neoplasm of transverse colon: Secondary | ICD-10-CM | POA: Diagnosis not present

## 2015-07-27 DIAGNOSIS — K648 Other hemorrhoids: Secondary | ICD-10-CM | POA: Insufficient documentation

## 2015-07-27 DIAGNOSIS — E079 Disorder of thyroid, unspecified: Secondary | ICD-10-CM | POA: Diagnosis not present

## 2015-07-27 DIAGNOSIS — Z85038 Personal history of other malignant neoplasm of large intestine: Secondary | ICD-10-CM | POA: Diagnosis not present

## 2015-07-27 DIAGNOSIS — Z9989 Dependence on other enabling machines and devices: Secondary | ICD-10-CM | POA: Diagnosis not present

## 2015-07-27 DIAGNOSIS — G709 Myoneural disorder, unspecified: Secondary | ICD-10-CM | POA: Insufficient documentation

## 2015-07-27 DIAGNOSIS — K635 Polyp of colon: Secondary | ICD-10-CM | POA: Insufficient documentation

## 2015-07-27 DIAGNOSIS — M797 Fibromyalgia: Secondary | ICD-10-CM | POA: Insufficient documentation

## 2015-07-27 HISTORY — PX: COLONOSCOPY: SHX5424

## 2015-07-27 HISTORY — DX: Hypothyroidism, unspecified: E03.9

## 2015-07-27 HISTORY — DX: Sleep apnea, unspecified: G47.30

## 2015-07-27 HISTORY — DX: Fibromyalgia: M79.7

## 2015-07-27 SURGERY — COLONOSCOPY
Anesthesia: Monitor Anesthesia Care

## 2015-07-27 MED ORDER — LACTATED RINGERS IV SOLN
INTRAVENOUS | Status: DC | PRN
  Administered 2015-07-27: 08:00:00 via INTRAVENOUS

## 2015-07-27 MED ORDER — FENTANYL CITRATE (PF) 100 MCG/2ML IJ SOLN: 25.0000 ug | INTRAMUSCULAR | Status: DC | PRN

## 2015-07-27 MED ORDER — PROPOFOL 500 MG/50ML IV EMUL
INTRAVENOUS | Status: DC | PRN
  Administered 2015-07-27: 50 mg via INTRAVENOUS

## 2015-07-27 MED ORDER — ONDANSETRON HCL 4 MG/2ML IJ SOLN: 4.0000 mg | Freq: Four times a day (QID) | INTRAMUSCULAR | Status: DC | PRN

## 2015-07-27 MED ORDER — PROPOFOL 500 MG/50ML IV EMUL
INTRAVENOUS | Status: DC | PRN
  Administered 2015-07-27: 125 ug/kg/min via INTRAVENOUS

## 2015-07-27 MED ORDER — PROPOFOL 10 MG/ML IV BOLUS
INTRAVENOUS | Status: AC
  Filled 2015-07-27: qty 40

## 2015-07-27 MED ORDER — LACTATED RINGERS IV SOLN: INTRAVENOUS | Status: DC

## 2015-07-27 MED ORDER — SODIUM CHLORIDE 0.9 % IV SOLN: INTRAVENOUS | Status: DC

## 2015-07-27 NOTE — Transfer of Care (Signed)
Immediate Anesthesia Transfer of Care Note  Patient: Joanna Alvarado  Procedure(s) Performed: Procedure(s): COLONOSCOPY (N/A)  Patient Location: PACU  Anesthesia Type:MAC  Level of Consciousness:  sedated, patient cooperative and responds to stimulation  Airway & Oxygen Therapy:Patient Spontanous Breathing and Patient connected to face mask oxgen  Post-op Assessment:  Report given to PACU RN and Post -op Vital signs reviewed and stable  Post vital signs:  Reviewed and stable  Last Vitals:  Filed Vitals:   07/27/15 0758  BP: 140/52  Pulse: 73  Temp: 37.1 C  Resp: 21    Complications: No apparent anesthesia complications

## 2015-07-27 NOTE — Op Note (Signed)
[facility] [place], [address]   COLONOSCOPY PROCEDURE REPORT  PATIENT: Joanna Alvarado, Joanna Alvarado  MR#: [id number] BIRTHDATE: August 20, 1955 , 59  yrs. old GENDER: female ENDOSCOPIST: Eustace Quail, MD REFERRED [id number] Program Recall PROCEDURE DATE:  07/27/2015 PROCEDURE:   Colonoscopy, surveillance and Colonoscopy with snare polypectomy x 6  ASA CLASS:   Class II INDICATIONS:Surveillance due to prior colonic neoplasia and PH Colon Adenoma.. Previous examinations 2003, 2006 ,2011 with multiple tubular adenomas MEDICATIONS: Monitored anesthesia care and Per Anesthesia DESCRIPTION OF PROCEDURE:   After the risks benefits and alternatives of the procedure were thoroughly explained, informed consent was obtained.  The digital rectal exam revealed no abnormalities of the rectum.   The Pentax Adult Colonscope [id number] endoscope was introduced through the anus and advanced to the cecum, which was identified by both the appendix and ileocecal valve. No adverse events experienced.   The quality of the prep was excellent.  (Suprep was used)  The instrument was then slowly withdrawn as the colon was fully examined. Estimated blood loss is zero unless otherwise noted in this procedure report.  COLON FINDINGS: Six polyps were found in the ascending (2 mm,9 mm sessile), transverse (3 mm), and descending colon (3 mm, 3 mm, 5 mm).  A polypectomy was performed with a cold snare.  The resection was complete, the polyp tissue was completely retrieved and sent to histology.   The examination was otherwise normal.  Retroflexed views revealed internal hemorrhoids. The time to cecum = 2.8 Withdrawal time = 21.9   The scope was withdrawn and the procedure completed. COMPLICATIONS: There were no immediate complications.  ENDOSCOPIC IMPRESSION: 1.   Six polyps were found in the colon; polypectomy was performed with a cold snare 2.   The examination was otherwise  normal  RECOMMENDATIONS: 1. Repeat Colonoscopy in 3 years.  eSigned:  Eustace Quail, MD 07/27/2015 9:24 AMrevisecc: The Patient   PATIENT NAME:  Joanna Alvarado, Joanna Alvarado MR#: [id number]

## 2015-07-27 NOTE — H&P (Signed)
HISTORY OF PRESENT ILLNESS:  Joanna Alvarado is a 60 y.o. female for surveillance colonoscopy.  REVIEW OF SYSTEMS:  All non-GI ROS negative except for  Past Medical History  Diagnosis Date  . Thyroid disease   . Hypertension   . Hypothyroidism   . Fibromyalgia   . Bowel habit changes   . Sleep apnea     uses c-pap    Past Surgical History  Procedure Laterality Date  . Tonsillectomy    . Appendectomy    . Abdominal hysterectomy    . Cholecystectomy    . Other surgical history      Triple orthosesis with tendon release  . Left foot surgery      removed tendon and placed pins in foot  . Eye surgery      bilateral cataracts with lens implants  . Floater bone surgery      bone removed from right hand  . Plantar fascia surgery      both feet    Social History Joanna Alvarado  reports that she has never smoked. She has never used smokeless tobacco. She reports that she does not drink alcohol or use illicit drugs.  family history includes Colon polyps in her brother; Diabetes in her mother; Heart disease in her mother; Hypertension in her father and mother.  Allergies  Allergen Reactions  . Codeine     REACTION: rash and itching  . Hydrocodone-Acetaminophen     Lips blister/swole  . Latex     Band-aids causes whelts and rash  . Other Palpitations    "makes me feel like I'm having a heart attack. -- Steroids        PHYSICAL EXAMINATION: Vital signs: BP 140/52 mmHg  Pulse 73  Temp(Src) 98.8 F (37.1 C) (Oral)  Resp 21  SpO2 97%  Constitutional: generally well-appearing, no acute distress Psychiatric: alert and oriented x3, cooperative Eyes: extraocular movements intact, anicteric, conjunctiva pink Mouth: oral pharynx moist, no lesions Neck: supple no lymphadenopathy Cardiovascular: heart regular rate and rhythm, no murmur Lungs: clear to auscultation bilaterally Abdomen: soft,obese nontender, nondistended, no obvious ascites, no peritoneal signs, normal  bowel sounds, no organomegaly Rectal:see colon report Extremities: no lower extremity edema bilaterally Skin: no lesions on visible extremities Neuro: No focal deficits.   ASSESSMENT:  1. Hx adenomatous polyps   PLAN:  1. Surveillance colonoscopy.The nature of the procedure, as well as the risks, benefits, and alternatives were carefully and thoroughly reviewed with the patient. Ample time for discussion and questions allowed. The patient understood, was satisfied, and agreed to proceed.

## 2015-07-27 NOTE — Anesthesia Preprocedure Evaluation (Signed)
Anesthesia Evaluation  Patient identified by MRN, date of birth, ID band Patient awake    Reviewed: Allergy & Precautions, NPO status , Patient's Chart, lab work & pertinent test results  Airway Mallampati: II   Neck ROM: full    Dental   Pulmonary neg pulmonary ROS,    breath sounds clear to auscultation       Cardiovascular hypertension,  Rhythm:regular Rate:Normal     Neuro/Psych  Neuromuscular disease    GI/Hepatic   Endo/Other  Hypothyroidism Morbid obesity  Renal/GU      Musculoskeletal  (+) Fibromyalgia -  Abdominal   Peds  Hematology   Anesthesia Other Findings   Reproductive/Obstetrics                             Anesthesia Physical Anesthesia Plan  ASA: II  Anesthesia Plan: MAC   Post-op Pain Management:    Induction: Intravenous  Airway Management Planned: Simple Face Mask  Additional Equipment:   Intra-op Plan:   Post-operative Plan:   Informed Consent: I have reviewed the patients History and Physical, chart, labs and discussed the procedure including the risks, benefits and alternatives for the proposed anesthesia with the patient or authorized representative who has indicated his/her understanding and acceptance.     Plan Discussed with: CRNA, Anesthesiologist and Surgeon  Anesthesia Plan Comments:         Anesthesia Quick Evaluation

## 2015-07-27 NOTE — Anesthesia Postprocedure Evaluation (Signed)
Anesthesia Post Note  Patient: PARIZODA DURIVAGE  Procedure(s) Performed: Procedure(s) (LRB): COLONOSCOPY (N/A)  Patient location during evaluation: PACU Anesthesia Type: MAC Level of consciousness: awake and alert Pain management: pain level controlled Vital Signs Assessment: post-procedure vital signs reviewed and stable Respiratory status: spontaneous breathing, nonlabored ventilation, respiratory function stable and patient connected to nasal cannula oxygen Cardiovascular status: stable and blood pressure returned to baseline Anesthetic complications: no    Last Vitals:  Filed Vitals:   07/27/15 0758 07/27/15 0921  BP: 140/52 128/105  Pulse: 73 85  Temp: 37.1 C 37.1 C  Resp: 21 21    Last Pain: There were no vitals filed for this visit.               [place]

## 2015-07-27 NOTE — Discharge Instructions (Signed)
Colonoscopy, Care After °Refer to this sheet in the next few weeks. These instructions provide you with information on caring for yourself after your procedure. Your health care provider may also give you more specific instructions. Your treatment has been planned according to current medical practices, but problems sometimes occur. Call your health care provider if you have any problems or questions after your procedure. °WHAT TO EXPECT AFTER THE PROCEDURE  °After your procedure, it is typical to have the following: °· A small amount of blood in your stool. °· Moderate amounts of gas and mild abdominal cramping or bloating. °HOME CARE INSTRUCTIONS °· Do not drive, operate machinery, or sign important documents for 24 hours. °· You may shower and resume your regular physical activities, but move at a slower pace for the first 24 hours. °· Take frequent rest periods for the first 24 hours. °· Walk around or put a warm pack on your abdomen to help reduce abdominal cramping and bloating. °· Drink enough fluids to keep your urine clear or pale yellow. °· You may resume your normal diet as instructed by your health care provider. Avoid heavy or fried foods that are hard to digest. °· Avoid drinking alcohol for 24 hours or as instructed by your health care provider. °· Only take over-the-counter or prescription medicines as directed by your health care provider. °· If a tissue sample (biopsy) was taken during your procedure: °¨ Do not take aspirin or blood thinners for 7 days, or as instructed by your health care provider. °¨ Do not drink alcohol for 7 days, or as instructed by your health care provider. °¨ Eat soft foods for the first 24 hours. °SEEK MEDICAL CARE IF: °You have persistent spotting of blood in your stool 2-3 days after the procedure. °SEEK IMMEDIATE MEDICAL CARE IF: °· You have more than a small spotting of blood in your stool. °· You pass large blood clots in your stool. °· Your abdomen is swollen  (distended). °· You have nausea or vomiting. °· You have a fever. °· You have increasing abdominal pain that is not relieved with medicine. °  °This information is not intended to replace advice given to you by your health care provider. Make sure you discuss any questions you have with your health care provider. °  °Document Released: 01/04/2004 Document Revised: 03/12/2013 Document Reviewed: 01/27/2013 °Elsevier Interactive Patient Education ©2016 [employer] ° °

## 2015-07-28 ENCOUNTER — Encounter (HOSPITAL_COMMUNITY): Payer: Self-pay | Admitting: Internal Medicine

## 2015-07-28 ENCOUNTER — Encounter: Payer: Self-pay | Admitting: Internal Medicine

## 2015-08-09 DIAGNOSIS — E039 Hypothyroidism, unspecified: Secondary | ICD-10-CM | POA: Diagnosis not present

## 2015-08-09 DIAGNOSIS — R7309 Other abnormal glucose: Secondary | ICD-10-CM | POA: Diagnosis not present

## 2015-08-09 DIAGNOSIS — Z1389 Encounter for screening for other disorder: Secondary | ICD-10-CM | POA: Diagnosis not present

## 2015-08-09 DIAGNOSIS — M797 Fibromyalgia: Secondary | ICD-10-CM | POA: Diagnosis not present

## 2015-08-09 DIAGNOSIS — Z79899 Other long term (current) drug therapy: Secondary | ICD-10-CM | POA: Diagnosis not present

## 2015-08-09 DIAGNOSIS — I10 Essential (primary) hypertension: Secondary | ICD-10-CM | POA: Diagnosis not present

## 2015-08-27 DIAGNOSIS — J019 Acute sinusitis, unspecified: Secondary | ICD-10-CM | POA: Diagnosis not present

## 2015-08-27 DIAGNOSIS — B9689 Other specified bacterial agents as the cause of diseases classified elsewhere: Secondary | ICD-10-CM | POA: Diagnosis not present

## 2015-08-27 DIAGNOSIS — R091 Pleurisy: Secondary | ICD-10-CM | POA: Diagnosis not present

## 2015-10-23 DIAGNOSIS — N3091 Cystitis, unspecified with hematuria: Secondary | ICD-10-CM | POA: Diagnosis not present

## 2015-10-24 DIAGNOSIS — R300 Dysuria: Secondary | ICD-10-CM | POA: Diagnosis not present

## 2015-10-24 DIAGNOSIS — R319 Hematuria, unspecified: Secondary | ICD-10-CM | POA: Diagnosis not present

## 2015-11-30 DIAGNOSIS — E039 Hypothyroidism, unspecified: Secondary | ICD-10-CM

## 2015-11-30 DIAGNOSIS — E559 Vitamin D deficiency, unspecified: Secondary | ICD-10-CM | POA: Diagnosis not present

## 2015-11-30 DIAGNOSIS — E041 Nontoxic single thyroid nodule: Secondary | ICD-10-CM | POA: Insufficient documentation

## 2015-11-30 HISTORY — DX: Vitamin D deficiency, unspecified: E55.9

## 2015-11-30 HISTORY — DX: Nontoxic single thyroid nodule: E04.1

## 2015-11-30 HISTORY — DX: Hypothyroidism, unspecified: E03.9

## 2016-02-10 DIAGNOSIS — Z1231 Encounter for screening mammogram for malignant neoplasm of breast: Secondary | ICD-10-CM | POA: Diagnosis not present

## 2016-06-02 DIAGNOSIS — E039 Hypothyroidism, unspecified: Secondary | ICD-10-CM | POA: Diagnosis not present

## 2016-06-02 DIAGNOSIS — E559 Vitamin D deficiency, unspecified: Secondary | ICD-10-CM | POA: Diagnosis not present

## 2016-06-02 DIAGNOSIS — E041 Nontoxic single thyroid nodule: Secondary | ICD-10-CM | POA: Diagnosis not present

## 2016-06-16 DIAGNOSIS — J329 Chronic sinusitis, unspecified: Secondary | ICD-10-CM | POA: Diagnosis not present

## 2016-08-26 DIAGNOSIS — M545 Low back pain: Secondary | ICD-10-CM | POA: Diagnosis not present

## 2016-09-14 ENCOUNTER — Other Ambulatory Visit: Payer: Self-pay | Admitting: Orthopedic Surgery

## 2016-09-14 DIAGNOSIS — M5416 Radiculopathy, lumbar region: Secondary | ICD-10-CM

## 2016-09-27 ENCOUNTER — Ambulatory Visit
Admission: RE | Admit: 2016-09-27 | Discharge: 2016-09-27 | Disposition: A | Discharge status: Home / Self Care | Payer: PPO | Source: Ambulatory Visit | Attending: Orthopedic Surgery | Admitting: Orthopedic Surgery

## 2016-09-27 DIAGNOSIS — M47816 Spondylosis without myelopathy or radiculopathy, lumbar region: Secondary | ICD-10-CM | POA: Diagnosis not present

## 2016-09-27 DIAGNOSIS — M5416 Radiculopathy, lumbar region: Secondary | ICD-10-CM

## 2016-09-29 DIAGNOSIS — M545 Low back pain: Secondary | ICD-10-CM | POA: Diagnosis not present

## 2016-09-29 DIAGNOSIS — M4696 Unspecified inflammatory spondylopathy, lumbar region: Secondary | ICD-10-CM | POA: Diagnosis not present

## 2016-10-05 DIAGNOSIS — I10 Essential (primary) hypertension: Secondary | ICD-10-CM | POA: Diagnosis not present

## 2016-10-05 DIAGNOSIS — K219 Gastro-esophageal reflux disease without esophagitis: Secondary | ICD-10-CM | POA: Diagnosis not present

## 2016-10-05 DIAGNOSIS — M797 Fibromyalgia: Secondary | ICD-10-CM | POA: Diagnosis not present

## 2016-10-05 DIAGNOSIS — R7309 Other abnormal glucose: Secondary | ICD-10-CM | POA: Diagnosis not present

## 2016-10-05 DIAGNOSIS — J019 Acute sinusitis, unspecified: Secondary | ICD-10-CM | POA: Diagnosis not present

## 2016-10-05 DIAGNOSIS — Z0000 Encounter for general adult medical examination without abnormal findings: Secondary | ICD-10-CM | POA: Diagnosis not present

## 2016-12-01 DIAGNOSIS — E041 Nontoxic single thyroid nodule: Secondary | ICD-10-CM | POA: Diagnosis not present

## 2016-12-01 DIAGNOSIS — E559 Vitamin D deficiency, unspecified: Secondary | ICD-10-CM | POA: Diagnosis not present

## 2016-12-01 DIAGNOSIS — E039 Hypothyroidism, unspecified: Secondary | ICD-10-CM | POA: Diagnosis not present

## 2016-12-12 DIAGNOSIS — B029 Zoster without complications: Secondary | ICD-10-CM | POA: Diagnosis not present

## 2016-12-12 DIAGNOSIS — Z6841 Body Mass Index (BMI) 40.0 and over, adult: Secondary | ICD-10-CM | POA: Diagnosis not present

## 2016-12-20 DIAGNOSIS — E039 Hypothyroidism, unspecified: Secondary | ICD-10-CM | POA: Diagnosis not present

## 2016-12-20 DIAGNOSIS — E559 Vitamin D deficiency, unspecified: Secondary | ICD-10-CM | POA: Diagnosis not present

## 2017-01-01 DIAGNOSIS — M1711 Unilateral primary osteoarthritis, right knee: Secondary | ICD-10-CM | POA: Diagnosis not present

## 2017-01-01 DIAGNOSIS — M79661 Pain in right lower leg: Secondary | ICD-10-CM | POA: Diagnosis not present

## 2017-01-10 DIAGNOSIS — Z1231 Encounter for screening mammogram for malignant neoplasm of breast: Secondary | ICD-10-CM | POA: Diagnosis not present

## 2017-01-10 DIAGNOSIS — N760 Acute vaginitis: Secondary | ICD-10-CM | POA: Diagnosis not present

## 2017-01-10 DIAGNOSIS — Z01419 Encounter for gynecological examination (general) (routine) without abnormal findings: Secondary | ICD-10-CM | POA: Diagnosis not present

## 2017-01-10 DIAGNOSIS — Z124 Encounter for screening for malignant neoplasm of cervix: Secondary | ICD-10-CM | POA: Diagnosis not present

## 2017-01-10 DIAGNOSIS — Z6841 Body Mass Index (BMI) 40.0 and over, adult: Secondary | ICD-10-CM | POA: Diagnosis not present

## 2017-05-23 DIAGNOSIS — E041 Nontoxic single thyroid nodule: Secondary | ICD-10-CM | POA: Diagnosis not present

## 2017-05-23 DIAGNOSIS — E039 Hypothyroidism, unspecified: Secondary | ICD-10-CM | POA: Diagnosis not present

## 2017-05-23 DIAGNOSIS — E559 Vitamin D deficiency, unspecified: Secondary | ICD-10-CM | POA: Diagnosis not present

## 2017-07-03 DIAGNOSIS — E039 Hypothyroidism, unspecified: Secondary | ICD-10-CM | POA: Diagnosis not present

## 2017-07-10 DIAGNOSIS — Z6841 Body Mass Index (BMI) 40.0 and over, adult: Secondary | ICD-10-CM | POA: Diagnosis not present

## 2017-07-10 DIAGNOSIS — J40 Bronchitis, not specified as acute or chronic: Secondary | ICD-10-CM | POA: Diagnosis not present

## 2017-07-10 DIAGNOSIS — J329 Chronic sinusitis, unspecified: Secondary | ICD-10-CM | POA: Diagnosis not present

## 2017-07-23 DIAGNOSIS — L578 Other skin changes due to chronic exposure to nonionizing radiation: Secondary | ICD-10-CM | POA: Diagnosis not present

## 2017-07-23 DIAGNOSIS — L720 Epidermal cyst: Secondary | ICD-10-CM | POA: Diagnosis not present

## 2017-07-23 DIAGNOSIS — D485 Neoplasm of uncertain behavior of skin: Secondary | ICD-10-CM | POA: Diagnosis not present

## 2017-08-06 DIAGNOSIS — D225 Melanocytic nevi of trunk: Secondary | ICD-10-CM | POA: Diagnosis not present

## 2017-08-06 DIAGNOSIS — L578 Other skin changes due to chronic exposure to nonionizing radiation: Secondary | ICD-10-CM | POA: Diagnosis not present

## 2017-08-28 DIAGNOSIS — R300 Dysuria: Secondary | ICD-10-CM | POA: Diagnosis not present

## 2017-08-28 DIAGNOSIS — Z6841 Body Mass Index (BMI) 40.0 and over, adult: Secondary | ICD-10-CM | POA: Diagnosis not present

## 2017-08-28 DIAGNOSIS — R509 Fever, unspecified: Secondary | ICD-10-CM | POA: Diagnosis not present

## 2017-11-05 DIAGNOSIS — J40 Bronchitis, not specified as acute or chronic: Secondary | ICD-10-CM | POA: Diagnosis not present

## 2017-11-05 DIAGNOSIS — J329 Chronic sinusitis, unspecified: Secondary | ICD-10-CM | POA: Diagnosis not present

## 2017-11-05 DIAGNOSIS — Z6841 Body Mass Index (BMI) 40.0 and over, adult: Secondary | ICD-10-CM | POA: Diagnosis not present

## 2017-11-21 DIAGNOSIS — E039 Hypothyroidism, unspecified: Secondary | ICD-10-CM | POA: Diagnosis not present

## 2017-11-21 DIAGNOSIS — E041 Nontoxic single thyroid nodule: Secondary | ICD-10-CM | POA: Diagnosis not present

## 2017-11-21 DIAGNOSIS — E559 Vitamin D deficiency, unspecified: Secondary | ICD-10-CM | POA: Diagnosis not present

## 2017-11-22 DIAGNOSIS — M5136 Other intervertebral disc degeneration, lumbar region: Secondary | ICD-10-CM | POA: Diagnosis not present

## 2017-11-22 DIAGNOSIS — M5137 Other intervertebral disc degeneration, lumbosacral region: Secondary | ICD-10-CM | POA: Diagnosis not present

## 2017-11-22 DIAGNOSIS — M9904 Segmental and somatic dysfunction of sacral region: Secondary | ICD-10-CM | POA: Diagnosis not present

## 2017-11-22 DIAGNOSIS — M9903 Segmental and somatic dysfunction of lumbar region: Secondary | ICD-10-CM | POA: Diagnosis not present

## 2017-11-22 DIAGNOSIS — M9902 Segmental and somatic dysfunction of thoracic region: Secondary | ICD-10-CM | POA: Diagnosis not present

## 2017-11-22 DIAGNOSIS — M546 Pain in thoracic spine: Secondary | ICD-10-CM | POA: Diagnosis not present

## 2017-11-22 DIAGNOSIS — M5441 Lumbago with sciatica, right side: Secondary | ICD-10-CM | POA: Diagnosis not present

## 2017-11-23 DIAGNOSIS — M5136 Other intervertebral disc degeneration, lumbar region: Secondary | ICD-10-CM | POA: Diagnosis not present

## 2017-11-23 DIAGNOSIS — M9904 Segmental and somatic dysfunction of sacral region: Secondary | ICD-10-CM | POA: Diagnosis not present

## 2017-11-23 DIAGNOSIS — M546 Pain in thoracic spine: Secondary | ICD-10-CM | POA: Diagnosis not present

## 2017-11-23 DIAGNOSIS — M9903 Segmental and somatic dysfunction of lumbar region: Secondary | ICD-10-CM | POA: Diagnosis not present

## 2017-11-23 DIAGNOSIS — M9902 Segmental and somatic dysfunction of thoracic region: Secondary | ICD-10-CM | POA: Diagnosis not present

## 2017-11-23 DIAGNOSIS — M5137 Other intervertebral disc degeneration, lumbosacral region: Secondary | ICD-10-CM | POA: Diagnosis not present

## 2017-11-23 DIAGNOSIS — M5441 Lumbago with sciatica, right side: Secondary | ICD-10-CM | POA: Diagnosis not present

## 2017-11-26 DIAGNOSIS — M9902 Segmental and somatic dysfunction of thoracic region: Secondary | ICD-10-CM | POA: Diagnosis not present

## 2017-11-26 DIAGNOSIS — M9904 Segmental and somatic dysfunction of sacral region: Secondary | ICD-10-CM | POA: Diagnosis not present

## 2017-11-26 DIAGNOSIS — M5136 Other intervertebral disc degeneration, lumbar region: Secondary | ICD-10-CM | POA: Diagnosis not present

## 2017-11-26 DIAGNOSIS — M5441 Lumbago with sciatica, right side: Secondary | ICD-10-CM | POA: Diagnosis not present

## 2017-11-26 DIAGNOSIS — M9903 Segmental and somatic dysfunction of lumbar region: Secondary | ICD-10-CM | POA: Diagnosis not present

## 2017-11-26 DIAGNOSIS — M546 Pain in thoracic spine: Secondary | ICD-10-CM | POA: Diagnosis not present

## 2017-11-26 DIAGNOSIS — M5137 Other intervertebral disc degeneration, lumbosacral region: Secondary | ICD-10-CM | POA: Diagnosis not present

## 2017-11-27 DIAGNOSIS — M9903 Segmental and somatic dysfunction of lumbar region: Secondary | ICD-10-CM | POA: Diagnosis not present

## 2017-11-27 DIAGNOSIS — M5137 Other intervertebral disc degeneration, lumbosacral region: Secondary | ICD-10-CM | POA: Diagnosis not present

## 2017-11-27 DIAGNOSIS — M5441 Lumbago with sciatica, right side: Secondary | ICD-10-CM | POA: Diagnosis not present

## 2017-11-27 DIAGNOSIS — M9904 Segmental and somatic dysfunction of sacral region: Secondary | ICD-10-CM | POA: Diagnosis not present

## 2017-11-27 DIAGNOSIS — M5136 Other intervertebral disc degeneration, lumbar region: Secondary | ICD-10-CM | POA: Diagnosis not present

## 2017-11-27 DIAGNOSIS — M9902 Segmental and somatic dysfunction of thoracic region: Secondary | ICD-10-CM | POA: Diagnosis not present

## 2017-11-27 DIAGNOSIS — M546 Pain in thoracic spine: Secondary | ICD-10-CM | POA: Diagnosis not present

## 2017-11-30 DIAGNOSIS — M9902 Segmental and somatic dysfunction of thoracic region: Secondary | ICD-10-CM | POA: Diagnosis not present

## 2017-11-30 DIAGNOSIS — M5136 Other intervertebral disc degeneration, lumbar region: Secondary | ICD-10-CM | POA: Diagnosis not present

## 2017-11-30 DIAGNOSIS — M546 Pain in thoracic spine: Secondary | ICD-10-CM | POA: Diagnosis not present

## 2017-11-30 DIAGNOSIS — M9903 Segmental and somatic dysfunction of lumbar region: Secondary | ICD-10-CM | POA: Diagnosis not present

## 2017-11-30 DIAGNOSIS — M5137 Other intervertebral disc degeneration, lumbosacral region: Secondary | ICD-10-CM | POA: Diagnosis not present

## 2017-11-30 DIAGNOSIS — M9904 Segmental and somatic dysfunction of sacral region: Secondary | ICD-10-CM | POA: Diagnosis not present

## 2017-11-30 DIAGNOSIS — M5441 Lumbago with sciatica, right side: Secondary | ICD-10-CM | POA: Diagnosis not present

## 2017-12-03 DIAGNOSIS — M546 Pain in thoracic spine: Secondary | ICD-10-CM | POA: Diagnosis not present

## 2017-12-03 DIAGNOSIS — M5441 Lumbago with sciatica, right side: Secondary | ICD-10-CM | POA: Diagnosis not present

## 2017-12-03 DIAGNOSIS — M5136 Other intervertebral disc degeneration, lumbar region: Secondary | ICD-10-CM | POA: Diagnosis not present

## 2017-12-03 DIAGNOSIS — M5137 Other intervertebral disc degeneration, lumbosacral region: Secondary | ICD-10-CM | POA: Diagnosis not present

## 2017-12-03 DIAGNOSIS — M9904 Segmental and somatic dysfunction of sacral region: Secondary | ICD-10-CM | POA: Diagnosis not present

## 2017-12-03 DIAGNOSIS — M9902 Segmental and somatic dysfunction of thoracic region: Secondary | ICD-10-CM | POA: Diagnosis not present

## 2017-12-03 DIAGNOSIS — M9903 Segmental and somatic dysfunction of lumbar region: Secondary | ICD-10-CM | POA: Diagnosis not present

## 2017-12-04 DIAGNOSIS — M546 Pain in thoracic spine: Secondary | ICD-10-CM | POA: Diagnosis not present

## 2017-12-04 DIAGNOSIS — M5137 Other intervertebral disc degeneration, lumbosacral region: Secondary | ICD-10-CM | POA: Diagnosis not present

## 2017-12-04 DIAGNOSIS — M9903 Segmental and somatic dysfunction of lumbar region: Secondary | ICD-10-CM | POA: Diagnosis not present

## 2017-12-04 DIAGNOSIS — M5441 Lumbago with sciatica, right side: Secondary | ICD-10-CM | POA: Diagnosis not present

## 2017-12-04 DIAGNOSIS — M9902 Segmental and somatic dysfunction of thoracic region: Secondary | ICD-10-CM | POA: Diagnosis not present

## 2017-12-04 DIAGNOSIS — M5136 Other intervertebral disc degeneration, lumbar region: Secondary | ICD-10-CM | POA: Diagnosis not present

## 2017-12-04 DIAGNOSIS — M9904 Segmental and somatic dysfunction of sacral region: Secondary | ICD-10-CM | POA: Diagnosis not present

## 2017-12-11 DIAGNOSIS — M9904 Segmental and somatic dysfunction of sacral region: Secondary | ICD-10-CM | POA: Diagnosis not present

## 2017-12-11 DIAGNOSIS — M9902 Segmental and somatic dysfunction of thoracic region: Secondary | ICD-10-CM | POA: Diagnosis not present

## 2017-12-11 DIAGNOSIS — M5441 Lumbago with sciatica, right side: Secondary | ICD-10-CM | POA: Diagnosis not present

## 2017-12-11 DIAGNOSIS — M5136 Other intervertebral disc degeneration, lumbar region: Secondary | ICD-10-CM | POA: Diagnosis not present

## 2017-12-11 DIAGNOSIS — M546 Pain in thoracic spine: Secondary | ICD-10-CM | POA: Diagnosis not present

## 2017-12-11 DIAGNOSIS — M9903 Segmental and somatic dysfunction of lumbar region: Secondary | ICD-10-CM | POA: Diagnosis not present

## 2017-12-11 DIAGNOSIS — M5137 Other intervertebral disc degeneration, lumbosacral region: Secondary | ICD-10-CM | POA: Diagnosis not present

## 2017-12-12 DIAGNOSIS — M5441 Lumbago with sciatica, right side: Secondary | ICD-10-CM | POA: Diagnosis not present

## 2017-12-12 DIAGNOSIS — M9904 Segmental and somatic dysfunction of sacral region: Secondary | ICD-10-CM | POA: Diagnosis not present

## 2017-12-12 DIAGNOSIS — M9902 Segmental and somatic dysfunction of thoracic region: Secondary | ICD-10-CM | POA: Diagnosis not present

## 2017-12-12 DIAGNOSIS — M5136 Other intervertebral disc degeneration, lumbar region: Secondary | ICD-10-CM | POA: Diagnosis not present

## 2017-12-12 DIAGNOSIS — M5137 Other intervertebral disc degeneration, lumbosacral region: Secondary | ICD-10-CM | POA: Diagnosis not present

## 2017-12-12 DIAGNOSIS — M9903 Segmental and somatic dysfunction of lumbar region: Secondary | ICD-10-CM | POA: Diagnosis not present

## 2017-12-12 DIAGNOSIS — M546 Pain in thoracic spine: Secondary | ICD-10-CM | POA: Diagnosis not present

## 2017-12-14 DIAGNOSIS — M9902 Segmental and somatic dysfunction of thoracic region: Secondary | ICD-10-CM | POA: Diagnosis not present

## 2017-12-14 DIAGNOSIS — M5136 Other intervertebral disc degeneration, lumbar region: Secondary | ICD-10-CM | POA: Diagnosis not present

## 2017-12-14 DIAGNOSIS — M9904 Segmental and somatic dysfunction of sacral region: Secondary | ICD-10-CM | POA: Diagnosis not present

## 2017-12-14 DIAGNOSIS — M5137 Other intervertebral disc degeneration, lumbosacral region: Secondary | ICD-10-CM | POA: Diagnosis not present

## 2017-12-14 DIAGNOSIS — M546 Pain in thoracic spine: Secondary | ICD-10-CM | POA: Diagnosis not present

## 2017-12-14 DIAGNOSIS — M5441 Lumbago with sciatica, right side: Secondary | ICD-10-CM | POA: Diagnosis not present

## 2017-12-14 DIAGNOSIS — M9903 Segmental and somatic dysfunction of lumbar region: Secondary | ICD-10-CM | POA: Diagnosis not present

## 2017-12-19 DIAGNOSIS — M5441 Lumbago with sciatica, right side: Secondary | ICD-10-CM | POA: Diagnosis not present

## 2017-12-19 DIAGNOSIS — M9904 Segmental and somatic dysfunction of sacral region: Secondary | ICD-10-CM | POA: Diagnosis not present

## 2017-12-19 DIAGNOSIS — M5137 Other intervertebral disc degeneration, lumbosacral region: Secondary | ICD-10-CM | POA: Diagnosis not present

## 2017-12-19 DIAGNOSIS — M5136 Other intervertebral disc degeneration, lumbar region: Secondary | ICD-10-CM | POA: Diagnosis not present

## 2017-12-19 DIAGNOSIS — M546 Pain in thoracic spine: Secondary | ICD-10-CM | POA: Diagnosis not present

## 2017-12-19 DIAGNOSIS — M9902 Segmental and somatic dysfunction of thoracic region: Secondary | ICD-10-CM | POA: Diagnosis not present

## 2017-12-19 DIAGNOSIS — M9903 Segmental and somatic dysfunction of lumbar region: Secondary | ICD-10-CM | POA: Diagnosis not present

## 2017-12-21 DIAGNOSIS — M9903 Segmental and somatic dysfunction of lumbar region: Secondary | ICD-10-CM | POA: Diagnosis not present

## 2017-12-21 DIAGNOSIS — M5136 Other intervertebral disc degeneration, lumbar region: Secondary | ICD-10-CM | POA: Diagnosis not present

## 2017-12-21 DIAGNOSIS — M9902 Segmental and somatic dysfunction of thoracic region: Secondary | ICD-10-CM | POA: Diagnosis not present

## 2017-12-21 DIAGNOSIS — M5441 Lumbago with sciatica, right side: Secondary | ICD-10-CM | POA: Diagnosis not present

## 2017-12-21 DIAGNOSIS — M5137 Other intervertebral disc degeneration, lumbosacral region: Secondary | ICD-10-CM | POA: Diagnosis not present

## 2017-12-21 DIAGNOSIS — M9904 Segmental and somatic dysfunction of sacral region: Secondary | ICD-10-CM | POA: Diagnosis not present

## 2017-12-21 DIAGNOSIS — M546 Pain in thoracic spine: Secondary | ICD-10-CM | POA: Diagnosis not present

## 2017-12-24 DIAGNOSIS — M546 Pain in thoracic spine: Secondary | ICD-10-CM | POA: Diagnosis not present

## 2017-12-24 DIAGNOSIS — M5136 Other intervertebral disc degeneration, lumbar region: Secondary | ICD-10-CM | POA: Diagnosis not present

## 2017-12-24 DIAGNOSIS — M5137 Other intervertebral disc degeneration, lumbosacral region: Secondary | ICD-10-CM | POA: Diagnosis not present

## 2017-12-24 DIAGNOSIS — M5441 Lumbago with sciatica, right side: Secondary | ICD-10-CM | POA: Diagnosis not present

## 2017-12-24 DIAGNOSIS — M9903 Segmental and somatic dysfunction of lumbar region: Secondary | ICD-10-CM | POA: Diagnosis not present

## 2017-12-24 DIAGNOSIS — M9904 Segmental and somatic dysfunction of sacral region: Secondary | ICD-10-CM | POA: Diagnosis not present

## 2017-12-24 DIAGNOSIS — M9902 Segmental and somatic dysfunction of thoracic region: Secondary | ICD-10-CM | POA: Diagnosis not present

## 2017-12-25 DIAGNOSIS — M5137 Other intervertebral disc degeneration, lumbosacral region: Secondary | ICD-10-CM | POA: Diagnosis not present

## 2017-12-25 DIAGNOSIS — M5136 Other intervertebral disc degeneration, lumbar region: Secondary | ICD-10-CM | POA: Diagnosis not present

## 2017-12-25 DIAGNOSIS — M9902 Segmental and somatic dysfunction of thoracic region: Secondary | ICD-10-CM | POA: Diagnosis not present

## 2017-12-25 DIAGNOSIS — M5441 Lumbago with sciatica, right side: Secondary | ICD-10-CM | POA: Diagnosis not present

## 2017-12-25 DIAGNOSIS — M546 Pain in thoracic spine: Secondary | ICD-10-CM | POA: Diagnosis not present

## 2017-12-25 DIAGNOSIS — M9903 Segmental and somatic dysfunction of lumbar region: Secondary | ICD-10-CM | POA: Diagnosis not present

## 2017-12-25 DIAGNOSIS — M9904 Segmental and somatic dysfunction of sacral region: Secondary | ICD-10-CM | POA: Diagnosis not present

## 2017-12-28 DIAGNOSIS — M5137 Other intervertebral disc degeneration, lumbosacral region: Secondary | ICD-10-CM | POA: Diagnosis not present

## 2017-12-28 DIAGNOSIS — M5441 Lumbago with sciatica, right side: Secondary | ICD-10-CM | POA: Diagnosis not present

## 2017-12-28 DIAGNOSIS — M9903 Segmental and somatic dysfunction of lumbar region: Secondary | ICD-10-CM | POA: Diagnosis not present

## 2017-12-28 DIAGNOSIS — M5136 Other intervertebral disc degeneration, lumbar region: Secondary | ICD-10-CM | POA: Diagnosis not present

## 2017-12-28 DIAGNOSIS — M9904 Segmental and somatic dysfunction of sacral region: Secondary | ICD-10-CM | POA: Diagnosis not present

## 2017-12-28 DIAGNOSIS — M9902 Segmental and somatic dysfunction of thoracic region: Secondary | ICD-10-CM | POA: Diagnosis not present

## 2017-12-28 DIAGNOSIS — M546 Pain in thoracic spine: Secondary | ICD-10-CM | POA: Diagnosis not present

## 2018-03-13 DIAGNOSIS — H8110 Benign paroxysmal vertigo, unspecified ear: Secondary | ICD-10-CM | POA: Diagnosis not present

## 2018-03-13 DIAGNOSIS — E039 Hypothyroidism, unspecified: Secondary | ICD-10-CM | POA: Diagnosis not present

## 2018-03-13 DIAGNOSIS — Z79899 Other long term (current) drug therapy: Secondary | ICD-10-CM | POA: Diagnosis not present

## 2018-03-13 DIAGNOSIS — M797 Fibromyalgia: Secondary | ICD-10-CM | POA: Diagnosis not present

## 2018-03-13 DIAGNOSIS — Z6841 Body Mass Index (BMI) 40.0 and over, adult: Secondary | ICD-10-CM | POA: Diagnosis not present

## 2018-03-13 DIAGNOSIS — Z0000 Encounter for general adult medical examination without abnormal findings: Secondary | ICD-10-CM | POA: Diagnosis not present

## 2018-03-13 DIAGNOSIS — I10 Essential (primary) hypertension: Secondary | ICD-10-CM | POA: Diagnosis not present

## 2018-03-13 DIAGNOSIS — B379 Candidiasis, unspecified: Secondary | ICD-10-CM | POA: Diagnosis not present

## 2018-03-13 DIAGNOSIS — Z23 Encounter for immunization: Secondary | ICD-10-CM | POA: Diagnosis not present

## 2018-03-19 DIAGNOSIS — R7309 Other abnormal glucose: Secondary | ICD-10-CM | POA: Diagnosis not present

## 2018-05-27 DIAGNOSIS — E559 Vitamin D deficiency, unspecified: Secondary | ICD-10-CM | POA: Diagnosis not present

## 2018-05-27 DIAGNOSIS — E119 Type 2 diabetes mellitus without complications: Secondary | ICD-10-CM

## 2018-05-27 DIAGNOSIS — E039 Hypothyroidism, unspecified: Secondary | ICD-10-CM | POA: Diagnosis not present

## 2018-05-27 DIAGNOSIS — Z794 Long term (current) use of insulin: Secondary | ICD-10-CM | POA: Diagnosis not present

## 2018-05-27 DIAGNOSIS — E041 Nontoxic single thyroid nodule: Secondary | ICD-10-CM | POA: Diagnosis not present

## 2018-05-27 HISTORY — DX: Type 2 diabetes mellitus without complications: E11.9

## 2018-06-04 DIAGNOSIS — I10 Essential (primary) hypertension: Secondary | ICD-10-CM | POA: Diagnosis not present

## 2018-06-04 DIAGNOSIS — E039 Hypothyroidism, unspecified: Secondary | ICD-10-CM | POA: Diagnosis not present

## 2018-06-13 DIAGNOSIS — M797 Fibromyalgia: Secondary | ICD-10-CM | POA: Diagnosis not present

## 2018-06-13 DIAGNOSIS — Z6841 Body Mass Index (BMI) 40.0 and over, adult: Secondary | ICD-10-CM | POA: Diagnosis not present

## 2018-06-13 DIAGNOSIS — R7309 Other abnormal glucose: Secondary | ICD-10-CM | POA: Diagnosis not present

## 2018-06-13 DIAGNOSIS — Z1331 Encounter for screening for depression: Secondary | ICD-10-CM | POA: Diagnosis not present

## 2018-06-13 DIAGNOSIS — I10 Essential (primary) hypertension: Secondary | ICD-10-CM | POA: Diagnosis not present

## 2018-06-28 DIAGNOSIS — J40 Bronchitis, not specified as acute or chronic: Secondary | ICD-10-CM | POA: Diagnosis not present

## 2018-06-28 DIAGNOSIS — J329 Chronic sinusitis, unspecified: Secondary | ICD-10-CM | POA: Diagnosis not present

## 2018-07-04 DIAGNOSIS — Z6841 Body Mass Index (BMI) 40.0 and over, adult: Secondary | ICD-10-CM | POA: Diagnosis not present

## 2018-07-04 DIAGNOSIS — Z01419 Encounter for gynecological examination (general) (routine) without abnormal findings: Secondary | ICD-10-CM | POA: Diagnosis not present

## 2018-07-04 DIAGNOSIS — R102 Pelvic and perineal pain: Secondary | ICD-10-CM | POA: Diagnosis not present

## 2018-07-04 DIAGNOSIS — Z1231 Encounter for screening mammogram for malignant neoplasm of breast: Secondary | ICD-10-CM | POA: Diagnosis not present

## 2018-07-23 ENCOUNTER — Encounter: Payer: Self-pay | Admitting: Internal Medicine

## 2018-07-23 ENCOUNTER — Other Ambulatory Visit (INDEPENDENT_AMBULATORY_CARE_PROVIDER_SITE_OTHER): Payer: PPO

## 2018-07-23 ENCOUNTER — Ambulatory Visit: Payer: PPO | Admitting: Internal Medicine

## 2018-07-23 VITALS — BP 106/52 | HR 68 | Ht 67.5 in | Wt 326.6 lb

## 2018-07-23 DIAGNOSIS — K5900 Constipation, unspecified: Secondary | ICD-10-CM

## 2018-07-23 DIAGNOSIS — R1032 Left lower quadrant pain: Secondary | ICD-10-CM | POA: Diagnosis not present

## 2018-07-23 DIAGNOSIS — Z86010 Personal history of colonic polyps: Secondary | ICD-10-CM

## 2018-07-23 DIAGNOSIS — R102 Pelvic and perineal pain: Secondary | ICD-10-CM

## 2018-07-23 LAB — CREATININE, SERUM: Creatinine, Ser: 1.13 mg/dL (ref 0.40–1.20)

## 2018-07-23 LAB — BUN: BUN: 16 mg/dL (ref 6–23)

## 2018-07-23 NOTE — Progress Notes (Signed)
HISTORY OF PRESENT ILLNESS:  Joanna Alvarado is a 63 y.o. female, daughter of Joanna Alvarado, with a history of morbid obesity, fibromyalgia, hypertension, diabetes mellitus, and sleep apnea who presents herself today for evaluation of progressive left lower quadrant abdominal pain and significant constipation.  The patient has not been seen in this office since 2011.  She does have a history of multiple adenomatous colon polyps with previous colonoscopies 2003, 2016, 2011, and most recently February 2017.  Her most recent exam revealed multiple adenomas as well as a sessile serrated polyp.  Follow-up in 3 years recommended.  Patient tells me that in January she was placed on a keto diet with resultant 60 pound weight loss.  In early February she reports abdominal discomfort after not having had a bowel movement for 13 days.  Took multiple laxatives and was subsequently able to move her bowels.  She is gone over a week without a bowel movement.  Typically she would have approximately 3 bowel movements per week.  She denies new medications.  She does take 3 stool softeners daily.  Next, she reports problems with left lower quadrant and left pelvic pain of at least 3 months duration.  This seems to be persistent and somewhat progressive.  Not coincident with constipation but prior.  She did see her gynecologist Dr. Milta Deiters who, after his evaluation, suggested GI origin for her symptoms and recommended this visit.  Patient denies rectal bleeding.  She does not notice any particular exacerbating or relieving factors such as meals or bowel movements.  No change with body position.  No relevant laboratories or x-rays upon review of her files  REVIEW OF SYSTEMS:  All non-GI ROS negative unless otherwise stated in the HPI except for urinary frequency, ankle swelling, exertional shortness of breath  Past Medical History:  Diagnosis Date  . Bowel habit changes   . Fibromyalgia   . Hypertension   . Hypothyroidism   . Sleep  apnea    uses c-pap  . Thyroid disease     Past Surgical History:  Procedure Laterality Date  . ABDOMINAL HYSTERECTOMY    . APPENDECTOMY    . CHOLECYSTECTOMY    . COLONOSCOPY N/A 07/27/2015   Procedure: COLONOSCOPY;  Surgeon: Irene Shipper, MD;  Location: [facility];  Service: Endoscopy;  Laterality: N/A;  . EYE SURGERY     bilateral cataracts with lens implants  . floater bone surgery     bone removed from right hand  . left foot surgery     removed tendon and placed pins in foot  . OTHER SURGICAL HISTORY     Triple orthosesis with tendon release  . PLANTAR FASCIA SURGERY     both feet  . TONSILLECTOMY      Social History Joanna Alvarado  reports that she has never smoked. She has never used smokeless tobacco. She reports that she does not drink alcohol or use drugs.  family history includes Colon polyps in her brother and brother; Diabetes in her mother; Heart disease in her mother; Hypertension in her father and mother.  Allergies  Allergen Reactions  . Codeine     REACTION: rash and itching  . Hydrocodone-Acetaminophen     Lips blister/swole  . Latex     Band-aids causes whelts and rash  . Other Palpitations    "makes me feel like I'm having a heart attack. -- Steroids        PHYSICAL EXAMINATION: Vital signs: BP (!) 106/52   Pulse  68   Ht 5' 7.5" (1.715 m)   Wt (!) 326 lb 9.6 oz (148.1 kg)   BMI 50.40 kg/m   Constitutional: Pleasant, significantly obese but otherwise generally well-appearing, no acute distress Psychiatric: alert and oriented x3, cooperative Eyes: extraocular movements intact, anicteric, conjunctiva pink Mouth: oral pharynx moist, no lesions Neck: supple no lymphadenopathy Cardiovascular: heart regular rate and rhythm, no murmur Lungs: clear to auscultation bilaterally Abdomen: soft, obese, slightly nontender in the left lower quadrant, nondistended, no obvious ascites, no peritoneal signs, normal bowel sounds, no organomegaly Rectal:  Omitted Extremities: no clubbing or cyanosis.  Trace lower extremity edema bilaterally Skin: no lesions on visible extremities Neuro: No focal deficits.  Cranial nerves intact  ASSESSMENT:  1.  Progressive left lower quadrant and pelvic pain with negative GYN evaluation.  Rule out mass or other abnormality. 2.  Worsening constipation.  May be secondary to abrupt dietary change. 3.  Morbid obesity 4.  History of multiple adenomatous colon polyps due for surveillance.  The patient is HIGH RISK given her body habitus   PLAN:  1.  Schedule contrast-enhanced CT scan of the abdomen and pelvis ASAP to evaluate progressive explained left lower quadrant and left pelvic pain 2.  For constipation recommended taking MiraLAX daily.  She may require several doses.  Adjust as needed. 3.  Contact the office in 2 weeks if no response to escalating doses of MiraLAX 4.  Routine office follow-up in 4 to 6 weeks.  At that time we can schedule her surveillance colonoscopy if appropriate.  The patient is HIGH RISK given her body habitus.

## 2018-07-23 NOTE — Patient Instructions (Signed)
Your provider has requested that you go to the basement level for lab work before leaving today. Press "B" on the elevator. The lab is located at the first door on the left as you exit the elevator. _____________________________________________________  Joanna Alvarado have been scheduled for a CT scan of the abdomen and pelvis at [facility] ([address]---this is in the same building as Press [facility]).    You are scheduled on 08/06/2018 at 10:45am. You should arrive 15 minutes prior to your appointment time for registration. Please follow the written instructions below on the day of your exam:  WARNING: IF YOU ARE ALLERGIC TO IODINE/X-RAY DYE, PLEASE NOTIFY RADIOLOGY IMMEDIATELY AT [phone]! YOU WILL BE GIVEN A 13 HOUR PREMEDICATION PREP.  1) Do not eat anything after 6:45am (4 hours prior to your test) 2) You have been given 2 bottles of oral contrast to drink. The solution may taste better if refrigerated, but do NOT add ice or any other liquid to this solution. Shake well before drinking.    Drink 1 bottle of contrast @ 8:45am (2 hours prior to your exam)  Drink 1 bottle of contrast @ 9:45am (1 hour prior to your exam)  You may take any medications as prescribed with a small amount of water, if necessary. If you take any of the following medications: METFORMIN, GLUCOPHAGE, GLUCOVANCE, AVANDAMET, RIOMET, FORTAMET, [place] MET, JANUMET, GLUMETZA or METAGLIP, you MAY be asked to HOLD this medication 48 hours AFTER the exam.  The purpose of you drinking the oral contrast is to aid in the visualization of your intestinal tract. The contrast solution may cause some diarrhea. Depending on your individual set of symptoms, you may also receive an intravenous injection of x-ray contrast/dye. Plan on being at [facility] for 30 minutes or longer, depending on the type of exam you are having performed.  This test typically takes 30-45 minutes to complete.  If you have any questions  regarding your exam or if you need to reschedule, you may call the CT department at [phone] between the hours of 8:00 am and 5:00 pm, Monday-Friday.  __________________________________________________________  Please purchase the following medications over the counter and take as directed: Miralax-17 grams twice daily dissolved in at least 8 ounces water/juice. May titrate this up as needed. ________________________________________________________  Please follow up with Dr Henrene Pastor 08/21/2018 @ 10:00 am.

## 2018-08-03 ENCOUNTER — Encounter: Payer: Self-pay | Admitting: Internal Medicine

## 2018-08-05 DIAGNOSIS — J111 Influenza due to unidentified influenza virus with other respiratory manifestations: Secondary | ICD-10-CM | POA: Diagnosis not present

## 2018-08-05 DIAGNOSIS — Z6841 Body Mass Index (BMI) 40.0 and over, adult: Secondary | ICD-10-CM | POA: Diagnosis not present

## 2018-08-05 DIAGNOSIS — R1084 Generalized abdominal pain: Secondary | ICD-10-CM | POA: Diagnosis not present

## 2018-08-06 ENCOUNTER — Inpatient Hospital Stay: Admission: RE | Admit: 2018-08-06 | Payer: PPO | Source: Ambulatory Visit

## 2018-08-08 DIAGNOSIS — H26492 Other secondary cataract, left eye: Secondary | ICD-10-CM | POA: Diagnosis not present

## 2018-08-08 DIAGNOSIS — Z961 Presence of intraocular lens: Secondary | ICD-10-CM | POA: Diagnosis not present

## 2018-08-08 DIAGNOSIS — H02831 Dermatochalasis of right upper eyelid: Secondary | ICD-10-CM | POA: Diagnosis not present

## 2018-08-08 DIAGNOSIS — H26493 Other secondary cataract, bilateral: Secondary | ICD-10-CM | POA: Diagnosis not present

## 2018-08-08 DIAGNOSIS — H18413 Arcus senilis, bilateral: Secondary | ICD-10-CM | POA: Diagnosis not present

## 2018-08-11 DIAGNOSIS — J111 Influenza due to unidentified influenza virus with other respiratory manifestations: Secondary | ICD-10-CM | POA: Diagnosis not present

## 2018-08-13 DIAGNOSIS — E119 Type 2 diabetes mellitus without complications: Secondary | ICD-10-CM | POA: Diagnosis not present

## 2018-08-13 DIAGNOSIS — R0602 Shortness of breath: Secondary | ICD-10-CM | POA: Diagnosis not present

## 2018-08-13 DIAGNOSIS — J988 Other specified respiratory disorders: Secondary | ICD-10-CM | POA: Diagnosis not present

## 2018-08-13 DIAGNOSIS — J181 Lobar pneumonia, unspecified organism: Secondary | ICD-10-CM | POA: Diagnosis not present

## 2018-08-13 DIAGNOSIS — R05 Cough: Secondary | ICD-10-CM | POA: Diagnosis not present

## 2018-08-13 DIAGNOSIS — R109 Unspecified abdominal pain: Secondary | ICD-10-CM | POA: Diagnosis not present

## 2018-08-13 DIAGNOSIS — J189 Pneumonia, unspecified organism: Secondary | ICD-10-CM | POA: Diagnosis not present

## 2018-08-14 ENCOUNTER — Telehealth: Payer: Self-pay | Admitting: Internal Medicine

## 2018-08-14 DIAGNOSIS — R05 Cough: Secondary | ICD-10-CM | POA: Diagnosis not present

## 2018-08-14 NOTE — Telephone Encounter (Signed)
Pt called in and advised that her appt for ct of abdomen was can due to the fact she was running a fever because she had the flu and was dx with a Kidney infection. She is wanting to speak with the nurse for some advice of what to do and if the scan can be set up at [facility]

## 2018-08-14 NOTE — Telephone Encounter (Signed)
Pt had temp of 102 and is on antibiotics for a kidney infection. Her ct was cancelled and she was told to call back when fever free for 48 hours to reschedule the scan. Pt wanted to know if she could get scan done at the hospital. Discussed with pt that she needs to finish the antibiotics and be fever free and call back to reschedule the scan as she was told. Pt verbalized understanding.

## 2018-08-15 ENCOUNTER — Other Ambulatory Visit: Payer: Self-pay

## 2018-08-15 ENCOUNTER — Emergency Department (HOSPITAL_COMMUNITY)
Admission: EM | Admit: 2018-08-15 | Discharge: 2018-08-15 | Disposition: A | Discharge status: Home / Self Care | Payer: PPO | Attending: Emergency Medicine | Admitting: Emergency Medicine

## 2018-08-15 ENCOUNTER — Encounter (HOSPITAL_COMMUNITY): Payer: Self-pay | Admitting: Emergency Medicine

## 2018-08-15 ENCOUNTER — Emergency Department (HOSPITAL_COMMUNITY): Payer: PPO

## 2018-08-15 ENCOUNTER — Inpatient Hospital Stay: Admission: RE | Admit: 2018-08-15 | Payer: PPO | Source: Ambulatory Visit

## 2018-08-15 DIAGNOSIS — R1032 Left lower quadrant pain: Secondary | ICD-10-CM | POA: Diagnosis not present

## 2018-08-15 DIAGNOSIS — N281 Cyst of kidney, acquired: Secondary | ICD-10-CM | POA: Diagnosis not present

## 2018-08-15 DIAGNOSIS — E876 Hypokalemia: Secondary | ICD-10-CM | POA: Insufficient documentation

## 2018-08-15 DIAGNOSIS — I10 Essential (primary) hypertension: Secondary | ICD-10-CM | POA: Insufficient documentation

## 2018-08-15 DIAGNOSIS — R069 Unspecified abnormalities of breathing: Secondary | ICD-10-CM | POA: Diagnosis not present

## 2018-08-15 DIAGNOSIS — E039 Hypothyroidism, unspecified: Secondary | ICD-10-CM | POA: Diagnosis not present

## 2018-08-15 DIAGNOSIS — R509 Fever, unspecified: Secondary | ICD-10-CM | POA: Diagnosis not present

## 2018-08-15 DIAGNOSIS — R52 Pain, unspecified: Secondary | ICD-10-CM | POA: Diagnosis not present

## 2018-08-15 DIAGNOSIS — R1084 Generalized abdominal pain: Secondary | ICD-10-CM | POA: Diagnosis not present

## 2018-08-15 DIAGNOSIS — R05 Cough: Secondary | ICD-10-CM | POA: Insufficient documentation

## 2018-08-15 DIAGNOSIS — Z79899 Other long term (current) drug therapy: Secondary | ICD-10-CM | POA: Insufficient documentation

## 2018-08-15 DIAGNOSIS — J189 Pneumonia, unspecified organism: Secondary | ICD-10-CM | POA: Diagnosis not present

## 2018-08-15 DIAGNOSIS — I959 Hypotension, unspecified: Secondary | ICD-10-CM | POA: Diagnosis not present

## 2018-08-15 DIAGNOSIS — Z91040 Latex allergy status: Secondary | ICD-10-CM | POA: Insufficient documentation

## 2018-08-15 LAB — CBC WITH DIFFERENTIAL/PLATELET
ABS IMMATURE GRANULOCYTES: 0.14 10*3/uL — AB (ref 0.00–0.07)
Basophils Absolute: 0.1 10*3/uL (ref 0.0–0.1)
Basophils Relative: 1 %
Eosinophils Absolute: 0.2 10*3/uL (ref 0.0–0.5)
Eosinophils Relative: 3 %
HCT: 41.2 % (ref 36.0–46.0)
Hemoglobin: 13.5 g/dL (ref 12.0–15.0)
Immature Granulocytes: 2 %
Lymphocytes Relative: 24 %
Lymphs Abs: 2.2 10*3/uL (ref 0.7–4.0)
MCH: 30.3 pg (ref 26.0–34.0)
MCHC: 32.8 g/dL (ref 30.0–36.0)
MCV: 92.6 fL (ref 80.0–100.0)
Monocytes Absolute: 0.8 10*3/uL (ref 0.1–1.0)
Monocytes Relative: 9 %
NEUTROS PCT: 61 %
Neutro Abs: 5.8 10*3/uL (ref 1.7–7.7)
Platelets: 216 10*3/uL (ref 150–400)
RBC: 4.45 MIL/uL (ref 3.87–5.11)
RDW: 12.3 % (ref 11.5–15.5)
WBC: 9.3 10*3/uL (ref 4.0–10.5)
nRBC: 0 % (ref 0.0–0.2)

## 2018-08-15 LAB — URINALYSIS, ROUTINE W REFLEX MICROSCOPIC
Bilirubin Urine: NEGATIVE
Glucose, UA: NEGATIVE mg/dL
Hgb urine dipstick: NEGATIVE
Ketones, ur: NEGATIVE mg/dL
LEUKOCYTE UA: NEGATIVE
NITRITE: NEGATIVE
PH: 6 (ref 5.0–8.0)
Protein, ur: NEGATIVE mg/dL
Specific Gravity, Urine: 1.021 (ref 1.005–1.030)

## 2018-08-15 LAB — POCT I-STAT EG7
ACID-BASE EXCESS: 3 mmol/L — AB (ref 0.0–2.0)
Bicarbonate: 27.2 mmol/L (ref 20.0–28.0)
CALCIUM ION: 1.06 mmol/L — AB (ref 1.15–1.40)
HEMATOCRIT: 40 % (ref 36.0–46.0)
Hemoglobin: 13.6 g/dL (ref 12.0–15.0)
O2 Saturation: 53 %
Potassium: 3 mmol/L — ABNORMAL LOW (ref 3.5–5.1)
Sodium: 143 mmol/L (ref 135–145)
TCO2: 28 mmol/L (ref 22–32)
pCO2, Ven: 40.9 mmHg — ABNORMAL LOW (ref 44.0–60.0)
pH, Ven: 7.431 — ABNORMAL HIGH (ref 7.250–7.430)
pO2, Ven: 27 mmHg — CL (ref 32.0–45.0)

## 2018-08-15 LAB — COMPREHENSIVE METABOLIC PANEL
ALT: 47 U/L — ABNORMAL HIGH (ref 0–44)
ANION GAP: 9 (ref 5–15)
AST: 65 U/L — ABNORMAL HIGH (ref 15–41)
Albumin: 2.7 g/dL — ABNORMAL LOW (ref 3.5–5.0)
Alkaline Phosphatase: 109 U/L (ref 38–126)
BUN: 15 mg/dL (ref 8–23)
CO2: 26 mmol/L (ref 22–32)
Calcium: 8.5 mg/dL — ABNORMAL LOW (ref 8.9–10.3)
Chloride: 106 mmol/L (ref 98–111)
Creatinine, Ser: 1.32 mg/dL — ABNORMAL HIGH (ref 0.44–1.00)
GFR calc Af Amer: 50 mL/min — ABNORMAL LOW (ref >60)
GFR calc non Af Amer: 43 mL/min — ABNORMAL LOW (ref >60)
Glucose, Bld: 125 mg/dL — ABNORMAL HIGH (ref 70–99)
Potassium: 3 mmol/L — ABNORMAL LOW (ref 3.5–5.1)
Sodium: 141 mmol/L (ref 135–145)
TOTAL PROTEIN: 6.4 g/dL — AB (ref 6.5–8.1)
Total Bilirubin: 1 mg/dL (ref 0.3–1.2)

## 2018-08-15 LAB — LIPASE, BLOOD: Lipase: 39 U/L (ref 11–51)

## 2018-08-15 LAB — LACTIC ACID, PLASMA: Lactic Acid, Venous: 1.8 mmol/L (ref 0.5–1.9)

## 2018-08-15 MED ORDER — IOHEXOL 300 MG/ML  SOLN
100.0000 mL | Freq: Once | INTRAMUSCULAR | Status: AC | PRN
  Administered 2018-08-15: 100 mL via INTRAVENOUS

## 2018-08-15 MED ORDER — SODIUM CHLORIDE 0.9 % IV BOLUS
1000.0000 mL | Freq: Once | INTRAVENOUS | Status: AC
  Administered 2018-08-15: 1000 mL via INTRAVENOUS

## 2018-08-15 NOTE — ED Triage Notes (Signed)
Pt has not had a normal BM since January. Pt diagnosed with flu last week. re diagnosed Sunday with flu. Pt having pain with urination and odor, urine checked Sunday neg for UTI. Pt abdominal pain is "sharp pain that shoots into uterus". 140/77, 91 HR, CBG 86.

## 2018-08-15 NOTE — ED Notes (Signed)
Patient verbalizes understanding of discharge instructions. Opportunity for questioning and answers were provided. Armband removed by staff, pt discharged from ED.  

## 2018-08-15 NOTE — Discharge Instructions (Addendum)
Try to drink extra fluid, to keep yourself well-hydrated.  Your potassium level was low and it will help to take potassium, 20 mEq, every day for 1 week.  Also try to eat some foods which have plenty of potassium in them.  Return here, if needed, for problems.

## 2018-08-15 NOTE — ED Notes (Signed)
Patient transported to CT 

## 2018-08-15 NOTE — ED Provider Notes (Signed)
[facility] Provider Note   CSN: [id number] Arrival date & time: 08/15/18  1632    History   Chief Complaint Chief Complaint  Patient presents with  . Abdominal Pain    Joanna Alvarado is a 63 y.o. female.     Joanna   She has been sick for 2 weeks with fever, cough, abdominal pain, and been seen several times.  Initially she was treated with Tamiflu for suspected influenza.  Last week, and told that she probably had the flu and was recommended to use high-dose Motrin every 8 hours as needed pain and fever.  Her PCP started her on Biaxin which she started a day and a half ago.  She continues to do that and sometimes takes Tylenol with Motrin.  She states she has not had a normal bowel movement since January 2020.  She states she is using MiraLAX 3 times a day, with only a small amount of stool produced and not much liquid when she has a bowel movement.  The stool is brown in color.  She denies nausea, vomiting, shortness of breath, dizziness, headache, neck pain or back pain.  There are no other known modifying factors.  Past Medical History:  Diagnosis Date  . Bowel habit changes   . Fibromyalgia   . Hypertension   . Hypothyroidism   . Sleep apnea    uses c-pap  . Thyroid disease     Patient Active Problem List   Diagnosis Date Noted  . Personal history of colonic polyps   . Benign neoplasm of ascending colon   . Benign neoplasm of transverse colon   . Benign neoplasm of descending colon   . Chronic postoperative pain 12/02/2012  . Disturbance of skin sensation 12/02/2012  . Myalgia and myositis, unspecified 12/02/2012    Past Surgical History:  Procedure Laterality Date  . ABDOMINAL HYSTERECTOMY    . APPENDECTOMY    . CHOLECYSTECTOMY    . COLONOSCOPY N/A 07/27/2015   Procedure: COLONOSCOPY;  Surgeon: Irene Shipper, MD;  Location: [facility];  Service: Endoscopy;  Laterality: N/A;  . EYE SURGERY     bilateral cataracts  with lens implants  . floater bone surgery     bone removed from right hand  . left foot surgery     removed tendon and placed pins in foot  . OTHER SURGICAL HISTORY     Triple orthosesis with tendon release  . PLANTAR FASCIA SURGERY     both feet  . TONSILLECTOMY       OB History   No obstetric history on file.      Home Medications    Prior to Admission medications   Medication Sig Start Date End Date Taking? Authorizing Provider  acetaminophen (TYLENOL) 500 MG tablet Take 1,000 mg by mouth every 6 (six) hours as needed for moderate pain.   Yes [provider]  albuterol (PROVENTIL HFA;VENTOLIN HFA) 108 (90 Base) MCG/ACT inhaler Inhale 2 puffs into the lungs every 6 (six) hours as needed for shortness of breath. 08/14/18  Yes [provider]  cholecalciferol (VITAMIN D3) 25 MCG (1000 UT) tablet Take 1,000 Units by mouth daily.   Yes [provider]  clarithromycin (BIAXIN) 500 MG tablet Take 500 mg by mouth 2 (two) times daily. 08/13/18  Yes [provider]  diclofenac (VOLTAREN) 75 MG EC tablet Take 75 mg by mouth daily.    Yes [provider]  levothyroxine (SYNTHROID, [place])  175 MCG tablet Take 175 mcg by mouth. Take 1.5 tablets on Monday, 1 tablet on Tuesday, Wednesday, Thursday, Friday, Saturday and Sunday   Yes [provider]  losartan-hydrochlorothiazide (HYZAAR) 100-12.5 MG tablet Take 1 tablet by mouth daily.  10/21/12  Yes [provider]  metFORMIN (GLUCOPHAGE) 500 MG tablet Take 500 mg by mouth 2 (two) times daily with a meal.   Yes [provider]  omeprazole (PRILOSEC) 40 MG capsule Take 40 mg by mouth daily.   Yes [provider]  promethazine-dextromethorphan (PROMETHAZINE-DM) 6.25-15 MG/5ML syrup Take 5 mLs by mouth every 6 (six) hours as needed for cough.   Yes [provider]    Family History Family History  Problem Relation Age of Onset  . Heart disease Mother   .  Diabetes Mother   . Hypertension Mother   . Hypertension Father   . Colon polyps Brother   . Colon polyps Brother     Social History Social History   Tobacco Use  . Smoking status: Never Smoker  . Smokeless tobacco: Never Used  Substance Use Topics  . Alcohol use: No  . Drug use: No     Allergies   Codeine; Hydrocodone-acetaminophen; Latex; and Other   Review of Systems Review of Systems  All other systems reviewed and are negative.    Physical Exam Updated Vital Signs BP (!) 115/51   Pulse 85   Temp 99.4 F (37.4 C) (Oral)   Resp 15   SpO2 97%   Physical Exam Vitals signs and nursing note reviewed.  Constitutional:      General: She is not in acute distress.    Appearance: She is well-developed. She is not ill-appearing or diaphoretic.  HENT:     Head: Normocephalic and atraumatic.     Right Ear: External ear normal.     Left Ear: External ear normal.     Mouth/Throat:     Mouth: Mucous membranes are moist.     Pharynx: No pharyngeal swelling.  Eyes:     Conjunctiva/sclera: Conjunctivae normal.     Pupils: Pupils are equal, round, and reactive to light.  Neck:     Musculoskeletal: Normal range of motion and neck supple.     Trachea: Phonation normal.  Cardiovascular:     Rate and Rhythm: Normal rate and regular rhythm.     Heart sounds: Normal heart sounds.  Pulmonary:     Effort: Pulmonary effort is normal.     Breath sounds: Normal breath sounds.  Abdominal:     Palpations: Abdomen is soft.     Tenderness: There is no abdominal tenderness.  Genitourinary:    Rectum: Normal.  Musculoskeletal: Normal range of motion.  Skin:    General: Skin is warm and dry.  Neurological:     Mental Status: She is alert and oriented to person, place, and time.     Cranial Nerves: No cranial nerve deficit.     Sensory: No sensory deficit.     Motor: No abnormal muscle tone.     Coordination: Coordination normal.  Psychiatric:        Behavior: Behavior  normal.        Thought Content: Thought content normal.        Judgment: Judgment normal.      ED Treatments / Results  Labs (all labs ordered are listed, but only abnormal results are displayed) Labs Reviewed  COMPREHENSIVE METABOLIC PANEL - Abnormal; Notable for the following components:  Result Value   Potassium 3.0 (*)    Glucose, Bld 125 (*)    Creatinine, Ser 1.32 (*)    Calcium 8.5 (*)    Total Protein 6.4 (*)    Albumin 2.7 (*)    AST 65 (*)    ALT 47 (*)    GFR calc non Af Amer 43 (*)    GFR calc Af Amer 50 (*)    All other components within normal limits  CBC WITH DIFFERENTIAL/PLATELET - Abnormal; Notable for the following components:   Abs Immature Granulocytes 0.14 (*)    All other components within normal limits  POCT I-STAT EG7 - Abnormal; Notable for the following components:   pH, Ven 7.431 (*)    pCO2, Ven 40.9 (*)    pO2, Ven 27.0 (*)    Acid-Base Excess 3.0 (*)    Potassium 3.0 (*)    Calcium, Ion 1.06 (*)    All other components within normal limits  LACTIC ACID, PLASMA  LIPASE, BLOOD  URINALYSIS, ROUTINE W REFLEX MICROSCOPIC  LACTIC ACID, PLASMA  BLOOD GAS, VENOUS    EKG None  Radiology Ct Abdomen Pelvis W Contrast  Result Date: 08/15/2018 CLINICAL DATA:  Pt has not had a normal BM since January. Pt diagnosed with flu last week. re diagnosed Sunday with flu. Pt having pain with urination and odor, urine checked Sunday neg for UTI. Pt abdominal pain is "sharp pain that shoots into uterus". EXAM: CT ABDOMEN AND PELVIS WITH CONTRAST TECHNIQUE: Multidetector CT imaging of the abdomen and pelvis was performed using the standard protocol following bolus administration of intravenous contrast. CONTRAST:  154mL OMNIPAQUE IOHEXOL 300 MG/ML  SOLN COMPARISON:  03/13/2013 FINDINGS: Lower chest: There is bronchial wall thickening with associated peribronchovascular opacity in the lower lobes, new from the prior CT. Findings are consistent with pneumonia in  the proper clinical setting. Hepatobiliary: Liver is normal in size. There are 2 small calcified lesions along the peripheral aspect of the right liver lobe, the larger measuring 17 mm. These lesions were subtly present on the prior CT. No other liver masses or lesions. Status post cholecystectomy. No bile duct dilation. Pancreas: Unremarkable. No pancreatic ductal dilatation or surrounding inflammatory changes. Spleen: Normal in size without focal abnormality. Adrenals/Urinary Tract: No adrenal masses. Kidneys normal in overall size, orientation and position with symmetric enhancement and excretion. 4.7 cm midpole left renal sinus cyst. No other masses, no stones and no hydronephrosis. Normal ureters. Bladder is decompressed but otherwise unremarkable. Stomach/Bowel: Small hiatal hernia. Stomach otherwise unremarkable. Small bowel and colon are normal in caliber. No wall thickening. No inflammatory changes. No increase in colonic stool. Vascular/Lymphatic: Aortic atherosclerosis. No enlarged abdominal or pelvic lymph nodes. Reproductive: Status post hysterectomy. No adnexal masses. Other: No abdominal wall hernia or abnormality. No abdominopelvic ascites. Musculoskeletal: No fracture or acute finding. No osteoblastic or osteolytic lesions. IMPRESSION: 1. Bilateral lower lobe bronchial wall thickening with peribronchovascular airspace opacities. Findings consistent with pneumonia in the proper clinical setting. 2. No acute findings within the abdomen or pelvis. No increased colonic stool. No bowel obstruction or inflammation. 3. Chronic changes include 2 small peripherally calcified, benign-appearing, liver lesions, aortic atherosclerosis and a left renal sinus cyst. Status post hysterectomy and cholecystectomy. Electronically Signed   By: Lajean Manes M.D.   On: 08/15/2018 19:27    Procedures .Critical Care Performed by: Daleen Bo, MD Authorized by: Daleen Bo, MD   Critical care provider  statement:    Critical care time (minutes):  35   Critical care start time:  08/15/2018 4:45 PM   Critical care end time:  08/15/2018 9:00 PM   Critical care time was exclusive of:  Separately billable procedures and treating other patients   Critical care was necessary to treat or prevent imminent or life-threatening deterioration of the following conditions:  Respiratory failure   Critical care was time spent personally by me on the following activities:  Blood draw for specimens, development of treatment plan with patient or surrogate, discussions with consultants, evaluation of patient's response to treatment, examination of patient, obtaining history from patient or surrogate, ordering and performing treatments and interventions, ordering and review of laboratory studies, pulse oximetry, re-evaluation of patient's condition, review of old charts and ordering and review of radiographic studies   (including critical care time)  Medications Ordered in ED Medications  sodium chloride 0.9 % bolus 1,000 mL (1,000 mLs Intravenous New Bag/Given 08/15/18 2044)  iohexol (OMNIPAQUE) 300 MG/ML solution 100 mL (100 mLs Intravenous Contrast Given 08/15/18 1857)     Initial Impression / Assessment and Plan / ED Course  I have reviewed the triage vital signs and the nursing notes.  Pertinent labs & imaging results that were available during my care of the patient were reviewed by me and considered in my medical decision making (see chart for details).  Clinical Course as of Aug 15 2115  Thu Aug 15, 2018  2048 Essentially normal venous gas, low potassium, low calcium  POCT I-Stat EG7(!!) [EW]  2048 Normal  Lactic acid, plasma [EW]  2048 Normal  Lipase, blood [EW]  2048 Normal  Urinalysis, Routine w reflex microscopic [EW]  2049 Lipase, blood [EW]  2049 Normal  CBC with Differential(!) [EW]  2049 Normal  Comprehensive metabolic panel(!) [EW]  2353 No acute intra-abdominal process, bilateral lower  lobe findings consistent with pneumonia.  Images reviewed by me  CT Abdomen Pelvis W Contrast [EW]  2050 Normal except potassium low, glucose high, creatinine high, calcium low, total protein low, albumin low, AST high, ALT high, GFR low  Comprehensive metabolic panel(!) [EW]    Clinical Course User Index [EW] Daleen Bo, MD        Patient Vitals for the past 24 hrs:  BP Temp Temp src Pulse Resp SpO2  08/15/18 1715 (!) 115/51 - - 85 15 97 %  08/15/18 1645 (!) 116/48 99.4 F (37.4 C) Oral 87 (!) 21 100 %    8:53 PM Reevaluation with update and discussion. After initial assessment and treatment, an updated evaluation reveals she is comfortable now, drinking fluid and has no further complaints.Daleen Bo   Medical Decision Making: Patient presenting with cough and malaise, with mild chest pain.  Screening for cardiac ischemia negative.  Chest pain very atypical for coronary syndrome.  CT angiogram done to rule out PE.  Patient currently on antibiotics, for respiratory symptoms with findings of pneumonia versus unspecified infiltrate bilateral lower lobes.  This likely represents residual pneumonia.  Doubt sepsis, metabolic instability or impending vascular collapse.  Plans made with patient for outpatient follow-up in 1 month to repeat CT imaging and assess for improvement in infiltrates.  CRITICAL CARE-yes Performed by: Daleen Bo  Nursing Notes Reviewed/ Care Coordinated Applicable Imaging Reviewed Interpretation of Laboratory Data incorporated into ED treatment  The patient appears reasonably screened and/or stabilized for discharge and I doubt any other medical condition or other [facility] requiring further screening, evaluation, or treatment in the ED at this time prior to discharge.  Plan:  Home Medications-continue current prescribed medications; Home Treatments-rest, fluids; return here if the recommended treatment, does not improve the symptoms; Recommended follow up-PCP  follow-up as needed and in 1 month for repeat CT imaging.   Final Clinical Impressions(s) / ED Diagnoses   Final diagnoses:  Left lower quadrant abdominal pain  Community acquired pneumonia, unspecified laterality  Hypokalemia    ED Discharge Orders    None       Daleen Bo, MD 08/18/18 1140

## 2018-08-15 NOTE — ED Notes (Signed)
Ice chips given

## 2018-08-21 DIAGNOSIS — E876 Hypokalemia: Secondary | ICD-10-CM | POA: Diagnosis not present

## 2018-08-21 DIAGNOSIS — Z6841 Body Mass Index (BMI) 40.0 and over, adult: Secondary | ICD-10-CM | POA: Diagnosis not present

## 2018-08-30 DIAGNOSIS — E039 Hypothyroidism, unspecified: Secondary | ICD-10-CM | POA: Diagnosis not present

## 2018-09-03 ENCOUNTER — Ambulatory Visit: Payer: PPO | Admitting: Internal Medicine

## 2018-11-27 DIAGNOSIS — E559 Vitamin D deficiency, unspecified: Secondary | ICD-10-CM | POA: Diagnosis not present

## 2018-11-27 DIAGNOSIS — E041 Nontoxic single thyroid nodule: Secondary | ICD-10-CM | POA: Diagnosis not present

## 2018-11-27 DIAGNOSIS — E039 Hypothyroidism, unspecified: Secondary | ICD-10-CM | POA: Diagnosis not present

## 2018-11-27 DIAGNOSIS — E119 Type 2 diabetes mellitus without complications: Secondary | ICD-10-CM | POA: Diagnosis not present

## 2019-02-24 DIAGNOSIS — J0110 Acute frontal sinusitis, unspecified: Secondary | ICD-10-CM | POA: Diagnosis not present

## 2019-02-24 DIAGNOSIS — Z6841 Body Mass Index (BMI) 40.0 and over, adult: Secondary | ICD-10-CM | POA: Diagnosis not present

## 2019-02-24 DIAGNOSIS — N3091 Cystitis, unspecified with hematuria: Secondary | ICD-10-CM | POA: Diagnosis not present

## 2019-02-25 DIAGNOSIS — N3000 Acute cystitis without hematuria: Secondary | ICD-10-CM | POA: Diagnosis not present

## 2019-03-03 ENCOUNTER — Encounter: Payer: Self-pay | Admitting: Internal Medicine

## 2019-03-10 ENCOUNTER — Other Ambulatory Visit: Payer: Self-pay

## 2019-03-10 ENCOUNTER — Telehealth: Payer: Self-pay

## 2019-03-10 DIAGNOSIS — Z1211 Encounter for screening for malignant neoplasm of colon: Secondary | ICD-10-CM

## 2019-03-10 NOTE — Telephone Encounter (Signed)
Joanna Alvarado,    I was preparing Joanna Alvarado's chart for [facility] and noticed that her BMI was 50.37 in February when she saw Dr. Henrene Pastor in the office. I called the patient to confirm her current weight and she said that it is 348. Lbs. In February she was 326. I was hoping that she would continue to lose weight and therefore she could keep her scheduled colonoscopy at the [facility] on 04/03/19. Obviously her BMI went up past the allowed BMI for [facility]. I spoke to the patient and told her that she would need to be scheduled for [facility] and she was fine with having it done there. They patient states that she is in pain and would like to have it done as soon as she was able to get in. I told Joanna Alvarado that you would be calling to reschedule her procedure at [facility]. Thanks!   Riki Sheer, LPN ( [facility] )

## 2019-03-14 ENCOUNTER — Other Ambulatory Visit (HOSPITAL_COMMUNITY): Payer: PPO

## 2019-03-14 ENCOUNTER — Other Ambulatory Visit: Payer: Self-pay

## 2019-03-14 DIAGNOSIS — Z1211 Encounter for screening for malignant neoplasm of colon: Secondary | ICD-10-CM

## 2019-03-14 NOTE — Telephone Encounter (Signed)
Pt scheduled for covid testing 04/24/19@10am . Colon scheduled at [facility] 04/28/19@8 :30am. Case [id number].

## 2019-03-20 ENCOUNTER — Telehealth: Payer: Self-pay | Admitting: *Deleted

## 2019-03-20 NOTE — Telephone Encounter (Signed)
Pt. Stated "they called me and cancelled my appointment for the nurse and for the colonoscopy she said she was waiting for someone to call her from the hospital to reschedule and no one ever did". Pt. Informed that she was scheduled today for nurse visit to go over instructions for colonoscopy to be done at the hospital on 04/28/19, "stated "I would have been there if I had known that". Patient rescheduled for pre-visit on 04/23/19 @ 11 am,also gave her date for covid test on 04/24/19 and procedure date for 04/28/19 to write down,before hanging up she verbalize understanding .

## 2019-03-20 NOTE — Telephone Encounter (Signed)
Dr. Henrene Pastor please review  pt. Is scheduled  for hospital colonoscopy d/t bmi over 50 is this ok or do you want ov

## 2019-03-24 NOTE — Telephone Encounter (Signed)
This is fine. Does not need OV. Thanks

## 2019-04-02 DIAGNOSIS — E039 Hypothyroidism, unspecified: Secondary | ICD-10-CM | POA: Diagnosis not present

## 2019-04-02 DIAGNOSIS — E119 Type 2 diabetes mellitus without complications: Secondary | ICD-10-CM | POA: Diagnosis not present

## 2019-04-02 DIAGNOSIS — E559 Vitamin D deficiency, unspecified: Secondary | ICD-10-CM | POA: Diagnosis not present

## 2019-04-02 DIAGNOSIS — E041 Nontoxic single thyroid nodule: Secondary | ICD-10-CM | POA: Diagnosis not present

## 2019-04-03 ENCOUNTER — Encounter: Payer: PPO | Admitting: Internal Medicine

## 2019-04-04 DIAGNOSIS — Z6841 Body Mass Index (BMI) 40.0 and over, adult: Secondary | ICD-10-CM | POA: Diagnosis not present

## 2019-04-04 DIAGNOSIS — R109 Unspecified abdominal pain: Secondary | ICD-10-CM | POA: Diagnosis not present

## 2019-04-04 DIAGNOSIS — K5900 Constipation, unspecified: Secondary | ICD-10-CM | POA: Diagnosis not present

## 2019-04-04 DIAGNOSIS — Z23 Encounter for immunization: Secondary | ICD-10-CM | POA: Diagnosis not present

## 2019-04-08 DIAGNOSIS — R05 Cough: Secondary | ICD-10-CM | POA: Diagnosis not present

## 2019-04-23 ENCOUNTER — Other Ambulatory Visit: Payer: Self-pay

## 2019-04-23 ENCOUNTER — Ambulatory Visit (AMBULATORY_SURGERY_CENTER): Payer: Self-pay | Admitting: *Deleted

## 2019-04-23 VITALS — Temp 96.8°F | Ht 67.5 in | Wt 360.6 lb

## 2019-04-23 DIAGNOSIS — Z86010 Personal history of colonic polyps: Secondary | ICD-10-CM

## 2019-04-23 NOTE — Progress Notes (Signed)
No egg or soy allergy known to patient  No issues with past sedation with any surgeries  or procedures, no intubation problems  No diet pills per patient No home 02 use per patient  No blood thinners per patient  Pt denies issues with constipation  No A fib or A flutter  EMMI video sent to pt's e mail   Due to the COVID-19 pandemic we are asking patients to follow these guidelines. Please only bring one care partner. Please be aware that your care partner may wait in the car in the parking lot or if they feel like they will be too hot to wait in the car, they may wait in the lobby on the 4th floor. All care partners are required to wear a mask the entire time (we do not have any that we can provide them), they need to practice social distancing, and we will do a Covid check for all patient's and care partners when you arrive. Also we will check their temperature and your temperature. If the care partner waits in their car they need to stay in the parking lot the entire time and we will call them on their cell phone when the patient is ready for discharge so they can bring the car to the front of the building. Also all patient's will need to wear a mask into building.  PLENVU SAMPLE PROVIDED FOR PT.  COVID TEST 04/24/19

## 2019-04-24 ENCOUNTER — Other Ambulatory Visit: Payer: Self-pay

## 2019-04-24 ENCOUNTER — Other Ambulatory Visit (HOSPITAL_COMMUNITY)
Admission: RE | Admit: 2019-04-24 | Discharge: 2019-04-24 | Disposition: A | Discharge status: Home / Self Care | Payer: PPO | Source: Ambulatory Visit | Attending: Internal Medicine | Admitting: Internal Medicine

## 2019-04-24 ENCOUNTER — Encounter (HOSPITAL_COMMUNITY): Payer: Self-pay | Admitting: Emergency Medicine

## 2019-04-24 DIAGNOSIS — Z20828 Contact with and (suspected) exposure to other viral communicable diseases: Secondary | ICD-10-CM | POA: Insufficient documentation

## 2019-04-24 DIAGNOSIS — Z01812 Encounter for preprocedural laboratory examination: Secondary | ICD-10-CM | POA: Insufficient documentation

## 2019-04-24 NOTE — Progress Notes (Signed)
Pre-op endo call completed  

## 2019-04-25 LAB — NOVEL CORONAVIRUS, NAA (HOSP ORDER, SEND-OUT TO REF LAB; TAT 18-24 HRS): SARS-CoV-2, NAA: NOT DETECTED

## 2019-04-25 NOTE — Progress Notes (Signed)
Pre-op call for Monday 11/23 complete. Patient states she will remain in quarantine thru the weekend. All questions addressed.

## 2019-04-27 NOTE — Anesthesia Preprocedure Evaluation (Addendum)
Anesthesia Evaluation  Patient identified by MRN, date of birth, ID band Patient awake    Reviewed: Allergy & Precautions, NPO status , Patient's Chart, lab work & pertinent test results  Airway Mallampati: III   Neck ROM: full    Dental  (+) Teeth Intact, Caps, Dental Advisory Given   Pulmonary sleep apnea and Continuous Positive Airway Pressure Ventilation ,    breath sounds clear to auscultation       Cardiovascular hypertension, Pt. on medications  Rhythm:regular Rate:Normal     Neuro/Psych  Neuromuscular disease    GI/Hepatic Neg liver ROS, GERD  Medicated,  Endo/Other  diabetesHypothyroidism Morbid obesity  Renal/GU negative Renal ROS     Musculoskeletal  (+) Arthritis , Osteoarthritis,  Fibromyalgia -  Abdominal (+) + obese,   Peds  Hematology  (+) Blood dyscrasia, anemia ,   Anesthesia Other Findings   Reproductive/Obstetrics                            Anesthesia Physical  Anesthesia Plan  ASA: III  Anesthesia Plan: MAC   Post-op Pain Management:    Induction: Intravenous  PONV Risk Score and Plan:   Airway Management Planned: Simple Face Mask  Additional Equipment:   Intra-op Plan:   Post-operative Plan:   Informed Consent: I have reviewed the patients History and Physical, chart, labs and discussed the procedure including the risks, benefits and alternatives for the proposed anesthesia with the patient or authorized representative who has indicated his/her understanding and acceptance.       Plan Discussed with: CRNA, Anesthesiologist and Surgeon  Anesthesia Plan Comments:         Anesthesia Quick Evaluation

## 2019-04-28 ENCOUNTER — Ambulatory Visit (HOSPITAL_COMMUNITY): Payer: PPO | Admitting: Anesthesiology

## 2019-04-28 ENCOUNTER — Encounter (HOSPITAL_COMMUNITY)
Admission: RE | Disposition: A | Discharge status: Home / Self Care | Payer: Self-pay | Source: Home / Self Care | Attending: Internal Medicine

## 2019-04-28 ENCOUNTER — Ambulatory Visit (HOSPITAL_COMMUNITY)
Admission: RE | Admit: 2019-04-28 | Discharge: 2019-04-28 | Disposition: A | Discharge status: Home / Self Care | Payer: PPO | Attending: Internal Medicine | Admitting: Internal Medicine

## 2019-04-28 ENCOUNTER — Encounter (HOSPITAL_COMMUNITY): Payer: Self-pay | Admitting: *Deleted

## 2019-04-28 ENCOUNTER — Other Ambulatory Visit: Payer: Self-pay

## 2019-04-28 DIAGNOSIS — D123 Benign neoplasm of transverse colon: Secondary | ICD-10-CM | POA: Insufficient documentation

## 2019-04-28 DIAGNOSIS — Z91040 Latex allergy status: Secondary | ICD-10-CM | POA: Insufficient documentation

## 2019-04-28 DIAGNOSIS — M1990 Unspecified osteoarthritis, unspecified site: Secondary | ICD-10-CM | POA: Diagnosis not present

## 2019-04-28 DIAGNOSIS — E119 Type 2 diabetes mellitus without complications: Secondary | ICD-10-CM | POA: Insufficient documentation

## 2019-04-28 DIAGNOSIS — Z885 Allergy status to narcotic agent status: Secondary | ICD-10-CM | POA: Diagnosis not present

## 2019-04-28 DIAGNOSIS — K635 Polyp of colon: Secondary | ICD-10-CM | POA: Diagnosis not present

## 2019-04-28 DIAGNOSIS — Z1211 Encounter for screening for malignant neoplasm of colon: Secondary | ICD-10-CM | POA: Diagnosis not present

## 2019-04-28 DIAGNOSIS — K5900 Constipation, unspecified: Secondary | ICD-10-CM | POA: Diagnosis not present

## 2019-04-28 DIAGNOSIS — R102 Pelvic and perineal pain: Secondary | ICD-10-CM | POA: Diagnosis not present

## 2019-04-28 DIAGNOSIS — K5730 Diverticulosis of large intestine without perforation or abscess without bleeding: Secondary | ICD-10-CM | POA: Insufficient documentation

## 2019-04-28 DIAGNOSIS — Z860100 Personal history of colon polyps, unspecified: Secondary | ICD-10-CM

## 2019-04-28 DIAGNOSIS — D125 Benign neoplasm of sigmoid colon: Secondary | ICD-10-CM | POA: Diagnosis not present

## 2019-04-28 DIAGNOSIS — M797 Fibromyalgia: Secondary | ICD-10-CM | POA: Diagnosis not present

## 2019-04-28 DIAGNOSIS — D126 Benign neoplasm of colon, unspecified: Secondary | ICD-10-CM | POA: Diagnosis not present

## 2019-04-28 DIAGNOSIS — Z6841 Body Mass Index (BMI) 40.0 and over, adult: Secondary | ICD-10-CM | POA: Insufficient documentation

## 2019-04-28 DIAGNOSIS — G709 Myoneural disorder, unspecified: Secondary | ICD-10-CM | POA: Insufficient documentation

## 2019-04-28 DIAGNOSIS — G4730 Sleep apnea, unspecified: Secondary | ICD-10-CM | POA: Insufficient documentation

## 2019-04-28 DIAGNOSIS — Z8249 Family history of ischemic heart disease and other diseases of the circulatory system: Secondary | ICD-10-CM | POA: Diagnosis not present

## 2019-04-28 DIAGNOSIS — I10 Essential (primary) hypertension: Secondary | ICD-10-CM | POA: Insufficient documentation

## 2019-04-28 DIAGNOSIS — R1032 Left lower quadrant pain: Secondary | ICD-10-CM | POA: Diagnosis not present

## 2019-04-28 DIAGNOSIS — D120 Benign neoplasm of cecum: Secondary | ICD-10-CM

## 2019-04-28 DIAGNOSIS — D122 Benign neoplasm of ascending colon: Secondary | ICD-10-CM | POA: Insufficient documentation

## 2019-04-28 HISTORY — PX: POLYPECTOMY: SHX5525

## 2019-04-28 HISTORY — PX: COLONOSCOPY WITH PROPOFOL: SHX5780

## 2019-04-28 LAB — GLUCOSE, CAPILLARY: Glucose-Capillary: 190 mg/dL — ABNORMAL HIGH (ref 70–99)

## 2019-04-28 SURGERY — COLONOSCOPY WITH PROPOFOL
Anesthesia: Monitor Anesthesia Care

## 2019-04-28 MED ORDER — LACTATED RINGERS IV SOLN
INTRAVENOUS | Status: DC
  Administered 2019-04-28: 1000 mL via INTRAVENOUS

## 2019-04-28 MED ORDER — PROPOFOL 10 MG/ML IV BOLUS
INTRAVENOUS | Status: DC | PRN
  Administered 2019-04-28: 20 mg via INTRAVENOUS

## 2019-04-28 MED ORDER — SODIUM CHLORIDE 0.9 % IV SOLN: INTRAVENOUS | Status: DC

## 2019-04-28 MED ORDER — PROPOFOL 10 MG/ML IV BOLUS
INTRAVENOUS | Status: AC
  Filled 2019-04-28: qty 20

## 2019-04-28 MED ORDER — PROPOFOL 500 MG/50ML IV EMUL
INTRAVENOUS | Status: AC
  Filled 2019-04-28: qty 50

## 2019-04-28 MED ORDER — PROPOFOL 500 MG/50ML IV EMUL
INTRAVENOUS | Status: DC | PRN
  Administered 2019-04-28: 125 ug/kg/min via INTRAVENOUS

## 2019-04-28 SURGICAL SUPPLY — 22 items

## 2019-04-28 NOTE — Transfer of Care (Signed)
Immediate Anesthesia Transfer of Care Note  Patient: Joanna Alvarado  Procedure(s) Performed: COLONOSCOPY WITH PROPOFOL (N/A ) BIOPSY POLYPECTOMY  Patient Location: PACU  Anesthesia Type:MAC  Level of Consciousness: awake, alert  and oriented  Airway & Oxygen Therapy: Patient Spontanous Breathing and Patient connected to face mask oxygen  Post-op Assessment: Report given to RN, Post -op Vital signs reviewed and stable and Patient moving all extremities X 4  Post vital signs: Reviewed and stable  Last Vitals:  Vitals Value Taken Time  BP    Temp    Pulse    Resp    SpO2      Last Pain:  Vitals:   04/28/19 0805  TempSrc: Oral  PainSc: 0-No pain         Complications: No apparent anesthesia complications

## 2019-04-28 NOTE — Discharge Instructions (Signed)

## 2019-04-28 NOTE — H&P (Signed)
HISTORY OF PRESENT ILLNESS:  Joanna Alvarado is a 63 y.o. female, daughter of Martin Majestic, with a history of morbid obesity, fibromyalgia, hypertension, diabetes mellitus, and sleep apnea who presents herself today for evaluation of progressive left lower quadrant abdominal pain and significant constipation.  The patient has not been seen in this office since 2011.  She does have a history of multiple adenomatous colon polyps with previous colonoscopies 2003, 2016, 2011, and most recently February 2017.  Her most recent exam revealed multiple adenomas as well as a sessile serrated polyp.  Follow-up in 3 years recommended.  Patient tells me that in January she was placed on a keto diet with resultant 60 pound weight loss.  In early February she reports abdominal discomfort after not having had a bowel movement for 13 days.  Took multiple laxatives and was subsequently able to move her bowels.  She is gone over a week without a bowel movement.  Typically she would have approximately 3 bowel movements per week.  She denies new medications.  She does take 3 stool softeners daily.  Next, she reports problems with left lower quadrant and left pelvic pain of at least 3 months duration.  This seems to be persistent and somewhat progressive.  Not coincident with constipation but prior.  She did see her gynecologist Dr. Milta Deiters who, after his evaluation, suggested GI origin for her symptoms and recommended this visit.  Patient denies rectal bleeding.  She does not notice any particular exacerbating or relieving factors such as meals or bowel movements.  No change with body position.  No relevant laboratories or x-rays upon review of her files  REVIEW OF SYSTEMS:  All non-GI ROS negative unless otherwise stated in the HPI except for urinary frequency, ankle swelling, exertional shortness of breath      Past Medical History:  Diagnosis Date  . Bowel habit changes   . Fibromyalgia   . Hypertension   . Hypothyroidism    . Sleep apnea    uses c-pap  . Thyroid disease          Past Surgical History:  Procedure Laterality Date  . ABDOMINAL HYSTERECTOMY    . APPENDECTOMY    . CHOLECYSTECTOMY    . COLONOSCOPY N/A 07/27/2015   Procedure: COLONOSCOPY;  Surgeon: Irene Shipper, MD;  Location: [facility];  Service: Endoscopy;  Laterality: N/A;  . EYE SURGERY     bilateral cataracts with lens implants  . floater bone surgery     bone removed from right hand  . left foot surgery     removed tendon and placed pins in foot  . OTHER SURGICAL HISTORY     Triple orthosesis with tendon release  . PLANTAR FASCIA SURGERY     both feet  . TONSILLECTOMY      Social History Joanna Alvarado  reports that she has never smoked. She has never used smokeless tobacco. She reports that she does not drink alcohol or use drugs.  family history includes Colon polyps in her brother and brother; Diabetes in her mother; Heart disease in her mother; Hypertension in her father and mother.       Allergies  Allergen Reactions  . Codeine     REACTION: rash and itching  . Hydrocodone-Acetaminophen     Lips blister/swole  . Latex     Band-aids causes whelts and rash  . Other Palpitations    "makes me feel like I'm having a heart attack. -- Steroids  PHYSICAL EXAMINATION: Vital signs: BP (!) 106/52   Pulse 68   Ht 5' 7.5" (1.715 m)   Wt (!) 326 lb 9.6 oz (148.1 kg)   BMI 50.40 kg/m   Constitutional: Pleasant, significantly obese but otherwise generally well-appearing, no acute distress Psychiatric: alert and oriented x3, cooperative Eyes: extraocular movements intact, anicteric, conjunctiva pink Mouth: oral pharynx moist, no lesions Neck: supple no lymphadenopathy Cardiovascular: heart regular rate and rhythm, no murmur Lungs: clear to auscultation bilaterally Abdomen: soft, obese, slightly nontender in the left lower quadrant, nondistended, no obvious  ascites, no peritoneal signs, normal bowel sounds, no organomegaly Rectal: Omitted Extremities: no clubbing or cyanosis.  Trace lower extremity edema bilaterally Skin: no lesions on visible extremities Neuro: No focal deficits.  Cranial nerves intact  ASSESSMENT:  1.  Progressive left lower quadrant and pelvic pain with negative GYN evaluation.  Rule out mass or other abnormality. 2.  Worsening constipation.  May be secondary to abrupt dietary change. 3.  Morbid obesity 4.  History of multiple adenomatous colon polyps due for surveillance.  The patient is HIGH RISK given her body habitus   PLAN:  1.  Schedule contrast-enhanced CT scan of the abdomen and pelvis ASAP to evaluate progressive explained left lower quadrant and left pelvic pain 2.  For constipation recommended taking MiraLAX daily.  She may require several doses.  Adjust as needed. 3.  Contact the office in 2 weeks if no response to escalating doses of MiraLAX 4.  Routine office follow-up in 4 to 6 weeks.  At that time we can schedule her surveillance colonoscopy if appropriate.  The patient is HIGH RISK given her body habitus.   No interval change. Completed prep. On Linzess 290 mg daily for constipation. Now for colonoscopy.The nature of the procedure, as well as the risks, benefits, and alternatives were carefully and thoroughly reviewed with the patient. Ample time for discussion and questions allowed. The patient understood, was satisfied, and agreed to proceed.  Docia Chuck. Geri Seminole., M.D. [facility] Division of Gastroenterology

## 2019-04-28 NOTE — Anesthesia Procedure Notes (Signed)
Procedure Name: MAC Date/Time: 04/28/2019 8:35 AM Performed by: Niel Hummer, CRNA Pre-anesthesia Checklist: Patient identified, Emergency Drugs available, Suction available and Patient being monitored Patient Re-evaluated:Patient Re-evaluated prior to induction Oxygen Delivery Method: Simple face mask

## 2019-04-28 NOTE — Op Note (Signed)
[facility] Patient Name: Joanna Alvarado Procedure Date: 04/28/2019 MRN: [id number] Attending MD: Docia Chuck. Henrene Pastor , MD Date of Birth: 1956-03-26 CSN: [id number] Age: 63 Admit Type: Outpatient Procedure:                Colonoscopy with cold snare polypectomy x 7 Indications:              High risk colon cancer surveillance: Personal                            history of multiple (3 or more) adenomas. Previous                            examinations 2003, 2006, 2011, 2017 Providers:                Docia Chuck. Henrene Pastor, MD, Grace Isaac, RN, Benay Pillow,                            RN, William Dalton, Technician Referring MD:             Aris Everts, MD Medicines:                Monitored Anesthesia Care Complications:            No immediate complications. Estimated blood loss:                            None. Estimated Blood Loss:     Estimated blood loss: none. Procedure:                Pre-Anesthesia Assessment:                           - Prior to the procedure, a History and Physical                            was performed, and patient medications and                            allergies were reviewed. The patient's tolerance of                            previous anesthesia was also reviewed. The risks                            and benefits of the procedure and the sedation                            options and risks were discussed with the patient.                            All questions were answered, and informed consent                            was obtained. Prior Anticoagulants: The patient has  taken no previous anticoagulant or antiplatelet                            agents. ASA Grade Assessment: III - A patient with                            severe systemic disease. After reviewing the risks                            and benefits, the patient was deemed in                            satisfactory condition to undergo the procedure.                         After obtaining informed consent, the colonoscope                            was passed under direct vision. Throughout the                            procedure, the patient's blood pressure, pulse, and                            oxygen saturations were monitored continuously. The                            [id number]) Olympus colonoscope was                            introduced through the anus and advanced to the the                            cecum, identified by appendiceal orifice and                            ileocecal valve. The ileocecal valve, appendiceal                            orifice, and rectum were photographed. The quality                            of the bowel preparation was excellent. The                            colonoscopy was performed without difficulty. The                            patient tolerated the procedure well. The bowel                            preparation used was SUPREP via split dose  instruction. Scope In: 8:42:38 AM Scope Out: 9:01:30 AM Scope Withdrawal Time: 0 hours 14 minutes 56 seconds  Total Procedure Duration: 0 hours 18 minutes 52 seconds  Findings:      Seven polyps were found in the sigmoid colon, transverse colon,       ascending colon and cecum. The polyps were 3 to 5 mm in size. These       polyps were removed with a cold snare. Resection and retrieval were       complete.      A few diverticula were found in the left colon.      The exam was otherwise without abnormality on direct and retroflexion       views. Impression:               - Seven 3 to 5 mm polyps in the sigmoid colon, in                            the transverse colon, in the ascending colon and in                            the cecum, removed with a cold snare. Resected and                            retrieved.                           - Diverticulosis in the left colon.                           - The  examination was otherwise normal on direct                            and retroflexion views. Moderate Sedation:      none Recommendation:           - Repeat colonoscopy in 3 years for surveillance.                           - Patient has a contact number available for                            emergencies. The signs and symptoms of potential                            delayed complications were discussed with the                            patient. Return to normal activities tomorrow.                            Written discharge instructions were provided to the                            patient.                           - Resume  previous diet.                           - Continue present medications.                           - Await pathology results. Procedure Code(s):        --- Professional ---                           [phone], Colonoscopy, flexible; with removal of                            tumor(s), polyp(s), or other lesion(s) by snare                            technique Diagnosis Code(s):        --- Professional ---                           Z86.010, Personal history of colonic polyps                           K63.5, Polyp of colon                           K57.30, Diverticulosis of large intestine without                            perforation or abscess without bleeding CPT copyright 2019 [employer]. All rights reserved. The codes documented in this report are preliminary and upon coder review may  be revised to meet current compliance requirements. Docia Chuck. Henrene Pastor, MD 04/28/2019 9:22:31 AM This report has been signed electronically. Number of Addenda: 0

## 2019-04-28 NOTE — Anesthesia Postprocedure Evaluation (Signed)
Anesthesia Post Note  Patient: Joanna Alvarado  Procedure(s) Performed: COLONOSCOPY WITH PROPOFOL (N/A ) POLYPECTOMY     Patient location during evaluation: PACU Anesthesia Type: MAC Level of consciousness: awake and alert Pain management: pain level controlled Vital Signs Assessment: post-procedure vital signs reviewed and stable Respiratory status: spontaneous breathing, nonlabored ventilation, respiratory function stable and patient connected to nasal cannula oxygen Cardiovascular status: stable and blood pressure returned to baseline Postop Assessment: no apparent nausea or vomiting Anesthetic complications: no    Last Vitals:  Vitals:   04/28/19 0930 04/28/19 0936  BP: (!) 154/61 (!) 159/64  Pulse: 68 72  Resp: (!) 22 18  Temp:    SpO2: 100% 100%    Last Pain:  Vitals:   04/28/19 0930  TempSrc:   PainSc: 4                  Velmer Broadfoot

## 2019-04-29 LAB — SURGICAL PATHOLOGY

## 2019-04-30 ENCOUNTER — Encounter (HOSPITAL_COMMUNITY): Payer: Self-pay | Admitting: Internal Medicine

## 2019-07-04 ENCOUNTER — Other Ambulatory Visit: Payer: Self-pay

## 2019-07-04 ENCOUNTER — Encounter (HOSPITAL_COMMUNITY): Payer: Self-pay | Admitting: Emergency Medicine

## 2019-07-04 ENCOUNTER — Emergency Department (HOSPITAL_COMMUNITY): Payer: PPO

## 2019-07-04 ENCOUNTER — Emergency Department (HOSPITAL_COMMUNITY)
Admission: EM | Admit: 2019-07-04 | Discharge: 2019-07-04 | Disposition: A | Discharge status: Home / Self Care | Payer: PPO | Attending: Emergency Medicine | Admitting: Emergency Medicine

## 2019-07-04 DIAGNOSIS — R0789 Other chest pain: Secondary | ICD-10-CM | POA: Diagnosis not present

## 2019-07-04 DIAGNOSIS — E119 Type 2 diabetes mellitus without complications: Secondary | ICD-10-CM | POA: Insufficient documentation

## 2019-07-04 DIAGNOSIS — Z79899 Other long term (current) drug therapy: Secondary | ICD-10-CM | POA: Insufficient documentation

## 2019-07-04 DIAGNOSIS — D696 Thrombocytopenia, unspecified: Secondary | ICD-10-CM

## 2019-07-04 DIAGNOSIS — Z791 Long term (current) use of non-steroidal anti-inflammatories (NSAID): Secondary | ICD-10-CM | POA: Diagnosis not present

## 2019-07-04 DIAGNOSIS — Z7984 Long term (current) use of oral hypoglycemic drugs: Secondary | ICD-10-CM | POA: Diagnosis not present

## 2019-07-04 DIAGNOSIS — E039 Hypothyroidism, unspecified: Secondary | ICD-10-CM | POA: Diagnosis not present

## 2019-07-04 DIAGNOSIS — R9431 Abnormal electrocardiogram [ECG] [EKG]: Secondary | ICD-10-CM | POA: Diagnosis not present

## 2019-07-04 DIAGNOSIS — R079 Chest pain, unspecified: Secondary | ICD-10-CM | POA: Diagnosis not present

## 2019-07-04 DIAGNOSIS — Z91040 Latex allergy status: Secondary | ICD-10-CM | POA: Diagnosis not present

## 2019-07-04 DIAGNOSIS — I10 Essential (primary) hypertension: Secondary | ICD-10-CM | POA: Insufficient documentation

## 2019-07-04 DIAGNOSIS — Z6841 Body Mass Index (BMI) 40.0 and over, adult: Secondary | ICD-10-CM | POA: Diagnosis not present

## 2019-07-04 LAB — CBC
HCT: 42.3 % (ref 36.0–46.0)
Hemoglobin: 14.2 g/dL (ref 12.0–15.0)
MCH: 31.4 pg (ref 26.0–34.0)
MCHC: 33.6 g/dL (ref 30.0–36.0)
MCV: 93.6 fL (ref 80.0–100.0)
Platelets: 117 10*3/uL — ABNORMAL LOW (ref 150–400)
RBC: 4.52 MIL/uL (ref 3.87–5.11)
RDW: 12.5 % (ref 11.5–15.5)
WBC: 5.7 10*3/uL (ref 4.0–10.5)
nRBC: 0 % (ref 0.0–0.2)

## 2019-07-04 LAB — BASIC METABOLIC PANEL
Anion gap: 9 (ref 5–15)
BUN: 15 mg/dL (ref 8–23)
CO2: 23 mmol/L (ref 22–32)
Calcium: 10.1 mg/dL (ref 8.9–10.3)
Chloride: 103 mmol/L (ref 98–111)
Creatinine, Ser: 1.23 mg/dL — ABNORMAL HIGH (ref 0.44–1.00)
GFR calc Af Amer: 54 mL/min — ABNORMAL LOW (ref >60)
GFR calc non Af Amer: 46 mL/min — ABNORMAL LOW (ref >60)
Glucose, Bld: 408 mg/dL — ABNORMAL HIGH (ref 70–99)
Potassium: 4.4 mmol/L (ref 3.5–5.1)
Sodium: 135 mmol/L (ref 135–145)

## 2019-07-04 LAB — TROPONIN I (HIGH SENSITIVITY)
Troponin I (High Sensitivity): 8 ng/L (ref <18)
Troponin I (High Sensitivity): 10 ng/L (ref <18)

## 2019-07-04 MED ORDER — FENTANYL CITRATE (PF) 100 MCG/2ML IJ SOLN
50.0000 ug | Freq: Once | INTRAMUSCULAR | Status: AC
  Administered 2019-07-04: 18:00:00 50 ug via INTRAVENOUS
  Filled 2019-07-04: qty 2

## 2019-07-04 MED ORDER — ONDANSETRON HCL 4 MG/2ML IJ SOLN
4.0000 mg | Freq: Once | INTRAMUSCULAR | Status: AC
  Administered 2019-07-04: 4 mg via INTRAVENOUS
  Filled 2019-07-04: qty 2

## 2019-07-04 MED ORDER — SODIUM CHLORIDE 0.9% FLUSH: 3.0000 mL | Freq: Once | INTRAVENOUS | Status: DC

## 2019-07-04 MED ORDER — METHOCARBAMOL 500 MG PO TABS: 500.0000 mg | ORAL_TABLET | Freq: Two times a day (BID) | ORAL | 0 refills | Status: DC

## 2019-07-04 NOTE — Discharge Instructions (Signed)
As we discussed, your platelet levels were slightly low here today.  This needs to be repeated in about 1 to 2 weeks by your primary care doctor to ensure it is getting better.  Take Robaxin as prescribed. This medication will make you drowsy so do not drive or drink alcohol when taking it.  As we discussed also, your work-up today was reassuring.  You will need to follow-up with a cardiologist determine the exact cause of your pain.  Return to the Emergency Department immediately if you experiencing worsening chest pain, difficulty breathing, nausea/vomiting, get very sweaty, headache or any other worsening or concerning symptoms.

## 2019-07-04 NOTE — ED Provider Notes (Signed)
[facility] EMERGENCY DEPARTMENT Provider Note   CSN: [id number] Arrival date & time: 07/04/19  1517     History Chief Complaint  Patient presents with  . Chest Pain    Joanna Alvarado is a 64 y.o. female has been history of anemia, diabetes, hypertension, fibromyalgia, GERD, hypothyroidism who presents for evaluation of intermittent chest pain.  She states that she first noticed the pain about 5 days ago.  She reports that about 1 AM, she woke up in the middle the night and felt like she "had been hit in the chest by a softball."  She states she had the pain to the left side of her chest.  She not have any associated diaphoresis, nausea/vomiting, shortness of breath.  States it lasted for about 20-30 seconds and then resolved.  She states she was able to go back to sleep.  She reports that over the last few days, she has had the intermittent pain again.  She states mostly when she is sleeping at night.  She states it normally goes by away by herself.  She states that today, she had pain that lasted a little bit longer.  She called her cardiologist who recommended that she be seen by her primary care doctor.  She went to her primary care doctor who stated that they saw some abnormalities in her EKG and prompted her to come to the emergency department for further evaluation.  She states that the pain eventually went away and since about 12, she has not had the pain.  States she had one little twinge in the afternoon but then quickly resolved.  At this time, she denies any pain.  She has not taken any medications for the pain.  She states that it does not radiate anywhere.  He does not smoke and denies any illicit drug use.  Denies any personal cardiac history or family history of MI before the age of 6.  She states she did have a stress test done 6 years ago that she got after her brother-in-law died of a heart attack.  She states that it was normal and has not had follow-up with  cardiology since.  Patient denies any recent fevers, cough, congestion, SOB.  She denies any abdominal pain.  She does have swelling in her legs and states that she always has some worsening swelling in her right leg. This has been an ongoing issue for many years and is not new.  She has been seen by this and wears compression socks to help this.  She is not on any exogenous hormone therapy, denies any history of blood clots in her legs or lungs, denies any recent travel, surgeries or hospitalizations.   The history is provided by the patient.    HPI: A 64 year old patient with a history of treated diabetes, hypertension and obesity presents for evaluation of chest pain. Initial onset of pain was approximately 3-6 hours ago. The patient's chest pain is described as heaviness/pressure/tightness and is worse with exertion. The patient's chest pain is middle- or left-sided, is not well-localized, is not sharp and does not radiate to the arms/jaw/neck. The patient does not complain of nausea and denies diaphoresis. The patient has no history of stroke, has no history of peripheral artery disease, has not smoked in the past 90 days, has no relevant family history of coronary artery disease (first degree relative at less than age 11) and has no history of hypercholesterolemia.   Past Medical History:  Diagnosis  Date  . Anemia   . Arthritis    KNEES  . Bowel habit changes   . Cataract    BILATERAL-REMOVED  . Diabetes mellitus without complication ([place])   . Fibromyalgia   . GERD (gastroesophageal reflux disease)   . Hypertension   . Hypothyroidism   . Sleep apnea    uses c-pap  . Thyroid disease     Patient Active Problem List   Diagnosis Date Noted  . History of colonic polyps   . Benign neoplasm of cecum   . Benign neoplasm of sigmoid colon   . Personal history of colonic polyps   . Benign neoplasm of ascending colon   . Benign neoplasm of transverse colon   . Benign neoplasm of  descending colon   . Chronic postoperative pain 12/02/2012  . Disturbance of skin sensation 12/02/2012  . Myalgia and myositis, unspecified 12/02/2012    Past Surgical History:  Procedure Laterality Date  . ABDOMINAL HYSTERECTOMY    . APPENDECTOMY    . CHOLECYSTECTOMY    . COLONOSCOPY N/A 07/27/2015   Procedure: COLONOSCOPY;  Surgeon: Irene Shipper, MD;  Location: [facility];  Service: Endoscopy;  Laterality: N/A;  . COLONOSCOPY WITH PROPOFOL N/A 04/28/2019   Procedure: COLONOSCOPY WITH PROPOFOL;  Surgeon: Irene Shipper, MD;  Location: [facility];  Service: Endoscopy;  Laterality: N/A;  . EYE SURGERY     bilateral cataracts with lens implants  . floater bone surgery     bone removed from right hand  . FOOT SURGERY     RIGHT  . left foot surgery     removed tendon and placed pins in foot  . OTHER SURGICAL HISTORY     Triple orthosesis with tendon release  . PLANTAR FASCIA SURGERY     both feet  . POLYPECTOMY  04/28/2019   Procedure: POLYPECTOMY;  Surgeon: Irene Shipper, MD;  Location: [facility];  Service: Endoscopy;;  . TONSILLECTOMY       OB History   No obstetric history on file.     Family History  Problem Relation Age of Onset  . Heart disease Mother   . Diabetes Mother   . Hypertension Mother   . Hypertension Father   . Colon polyps Brother   . Colon polyps Brother   . Colon cancer Neg Hx   . Esophageal cancer Neg Hx   . Rectal cancer Neg Hx   . Stomach cancer Neg Hx     Social History   Tobacco Use  . Smoking status: Never Smoker  . Smokeless tobacco: Never Used  Substance Use Topics  . Alcohol use: No  . Drug use: No    Home Medications Prior to Admission medications   Medication Sig Start Date End Date Taking? Authorizing Provider  acetaminophen (TYLENOL) 500 MG tablet Take 1,000 mg by mouth every 6 (six) hours as needed for moderate pain.    [provider]  albuterol (PROVENTIL HFA;VENTOLIN HFA) 108 (90 Base) MCG/ACT inhaler  Inhale 2 puffs into the lungs every 6 (six) hours as needed for shortness of breath. 08/14/18   [provider]  cholecalciferol (VITAMIN D3) 25 MCG (1000 UT) tablet Take 1,000 Units by mouth daily.    [provider]  diclofenac (VOLTAREN) 75 MG EC tablet Take 75 mg by mouth daily.     [provider]  ergocalciferol (VITAMIN D2) 1.25 MG (50000 UT) capsule Take 50,000 Units by mouth once a week.    [provider]  levothyroxine (SYNTHROID, LEVOTHROID) 175 MCG tablet Take 175-262.5 mcg by mouth See admin instructions. Take 262.5 mg  on Monday and Friday Tale 175 mg t on Tuesday, Wednesday, Thursday, Friday, Saturday and Sunday    [provider]  LINZESS 290 MCG CAPS capsule Take 290 mcg by mouth daily. 04/04/19   [provider]  losartan-hydrochlorothiazide (HYZAAR) 100-12.5 MG tablet Take 1 tablet by mouth daily.  10/21/12   [provider]  metFORMIN (GLUCOPHAGE-XR) 500 MG 24 hr tablet Take 1,000 mg by mouth 2 (two) times daily. 03/28/19   [provider]  methocarbamol (ROBAXIN) 500 MG tablet Take 1 tablet (500 mg total) by mouth 2 (two) times daily. 07/04/19   Volanda Napoleon, PA-C  omeprazole (PRILOSEC) 40 MG capsule Take 40 mg by mouth daily.    [provider]    Allergies    Codeine, Hydrocodone-acetaminophen, Latex, Oxycontin [oxycodone hcl], Trulicity [dulaglutide], and Other  Review of Systems   Review of Systems  Constitutional: Negative for fever.  Respiratory: Negative for cough and shortness of breath.   Cardiovascular: Positive for chest pain and leg swelling (Chronic).  Gastrointestinal: Negative for abdominal pain, nausea and vomiting.  Genitourinary: Negative for dysuria and hematuria.  Neurological: Negative for headaches.  All other systems reviewed and are negative.   Physical Exam Updated Vital Signs BP (!) 139/55 (BP Location: Right Arm)   Pulse 69   Temp 98.9 F (37.2 C) (Oral)    Resp 15   SpO2 97%   Physical Exam Vitals and nursing note reviewed.  Constitutional:      Appearance: Normal appearance. She is well-developed.     Comments: Sitting comfortably on examination table  HENT:     Head: Normocephalic and atraumatic.  Eyes:     General: Lids are normal.     Conjunctiva/sclera: Conjunctivae normal.     Pupils: Pupils are equal, round, and reactive to light.  Cardiovascular:     Rate and Rhythm: Normal rate and regular rhythm.     Pulses: Normal pulses.          Radial pulses are 2+ on the right side and 2+ on the left side.       Dorsalis pedis pulses are 2+ on the right side and 2+ on the left side.     Heart sounds: Normal heart sounds. No murmur. No friction rub. No gallop.   Pulmonary:     Effort: Pulmonary effort is normal.     Breath sounds: Normal breath sounds.     Comments: Lungs clear to auscultation bilaterally.  Symmetric chest rise.  No wheezing, rales, rhonchi. Abdominal:     Palpations: Abdomen is soft. Abdomen is not rigid.     Tenderness: There is no abdominal tenderness. There is no guarding.     Comments: Abdomen is soft, non-distended, non-tender. No rigidity, No guarding. No peritoneal signs.  Musculoskeletal:        General: Normal range of motion.     Cervical back: Full passive range of motion without pain.     Comments: Compression stocking noted on right lower extremity.  No overlying warmth, erythema.  Right lower extremity looks similar to left lower extremity.  No symmetry noted.  Skin:    General: Skin is warm and dry.     Capillary Refill: Capillary refill takes less than 2 seconds.  Neurological:     Mental Status: She is alert and oriented to person, place, and time.  Psychiatric:  Speech: Speech normal.     ED Results / Procedures / Treatments   Labs (all labs ordered are listed, but only abnormal results are displayed) Labs Reviewed  BASIC METABOLIC PANEL - Abnormal; Notable for the following  components:      Result Value   Glucose, Bld 408 (*)    Creatinine, Ser 1.23 (*)    GFR calc non Af Amer 46 (*)    GFR calc Af Amer 54 (*)    All other components within normal limits  CBC - Abnormal; Notable for the following components:   Platelets 117 (*)    All other components within normal limits  TROPONIN I (HIGH SENSITIVITY)  TROPONIN I (HIGH SENSITIVITY)    EKG EKG Interpretation  Date/Time:  Friday July 04 2019 15:26:05 EST Ventricular Rate:  76 PR Interval:  130 QRS Duration: 108 QT Interval:  402 QTC Calculation: 452 R Axis:   -24 Text Interpretation: Normal sinus rhythm Incomplete right bundle branch block Moderate voltage criteria for LVH, may be normal variant ( R in aVL , Cornell product ) Septal infarct , age undetermined Abnormal ECG Confirmed by Davonna Belling [phone]) on 07/04/2019 4:27:33 PM   Radiology DG Chest 2 View  Result Date: 07/04/2019 CLINICAL DATA:  Chest pain EXAM: CHEST - 2 VIEW COMPARISON:  None. FINDINGS: The heart size and mediastinal contours are within normal limits. Faint opacity within the peripheral aspect of the left lower lobe not well seen on lateral view. Right lung appears clear. No pleural effusion or pneumothorax. The visualized skeletal structures are unremarkable. IMPRESSION: Faint opacity within the peripheral aspect of the left lower lobe could reflect a small developing infiltrate. Lungs are otherwise clear. Electronically Signed   By: Davina Poke D.O.   On: 07/04/2019 16:12    Procedures Procedures (including critical care time)  Medications Ordered in ED Medications  sodium chloride flush (NS) 0.9 % injection 3 mL (has no administration in time range)  ondansetron (ZOFRAN) injection 4 mg (4 mg Intravenous Given 07/04/19 1736)  fentaNYL (SUBLIMAZE) injection 50 mcg (50 mcg Intravenous Given 07/04/19 1736)    ED Course  I have reviewed the triage vital signs and the nursing notes.  Pertinent labs & imaging  results that were available during my care of the patient were reviewed by me and considered in my medical decision making (see chart for details).    MDM Rules/Calculators/A&P HEAR Score: 60                    64 year old female who presents for evaluation of intermittent chest pain that is been occurring for the last 5 days.  Seen by PCP who advised her to come to the emergency department for further evaluation.  Currently denies any pain.  On initial ED arrival, she is afebrile, nontoxic-appearing.  Vital signs are stable.  On exam, pain is worse with palpation of her chest as well as movement of her left upper extremity.  Lungs clear to auscultation.  Low suspicion for ACS etiology as it sounds atypical but she does have some risk factors.  Also consider musculoskeletal versus infectious disease though seems less likely.  History/physical exam is not concerning for PE.  She does report that she has had leg swelling but states that this is been an ongoing issue that she has been seen for.  She wears a compression stocking to help with it.  No other PE risk factors.  She has not hypoxic or tachycardic.  Check labs, EKG, chest x-ray.  Troponin is negative.  CBC shows no leukocytosis or anemia.  BMP shows BUN of 15, creatinine of 1.23.  Chest x-ray shows faint opacity in the left lower lung that could represent atelectasis versus developing infiltrate.  She has not had any cough, fevers, chills.  She has no leukocytosis.   Given patient's history/risk factors, she has a heart score 4.  We will plan to delta troponin.  Delta troponin negative.  At this time, her symptoms are worsened by palpation and movement of her extremity.  Additionally, this has been more intermittent.  This definitely has some atypical features.  At this time, given 2 - delta troponins and the fact that her pain is been occurring for the last 5 days, feels that this is an acute ACS etiology that needs acute emergent intervention.   Additionally, she has no shortness of breath, tachycardia, hypoxia, she does report she has leg swelling but this is been a longstanding ongoing issue for which she has been evaluated before.  She wears compression socks to help with that.  Do not believe that this is result of PE.  At this time, believe patient can be followed with outpatient cardiology for further evaluation. Discussed patient with Dr. Alvino Chapel who is agreeable to plan. At this time, patient exhibits no emergent life-threatening condition that require further evaluation in ED or admission. Patient had ample opportunity for questions and discussion. All patient's questions were answered with full understanding. Strict return precautions discussed. Patient expresses understanding and agreement to plan.   Portions of this note were generated with [employer]. Dictation errors may occur despite best attempts at proofreading.   Final Clinical Impression(s) / ED Diagnoses Final diagnoses:  Atypical chest pain  Thrombocytopenia ([place])    Rx / DC Orders ED Discharge Orders         Ordered    methocarbamol (ROBAXIN) 500 MG tablet  2 times daily     07/04/19 1929           Desma Mcgregor 07/04/19 2057    Davonna Belling, MD 07/04/19 [id number]

## 2019-07-04 NOTE — ED Triage Notes (Signed)
Pt here from home with c/o chest pain center of her chest worse on palpation , no n/v or sob , pt does have a slightly abnormal ekg from MD office

## 2019-07-04 NOTE — ED Notes (Signed)
Pt reports some itchiness that started and lasted for a brief time and now has mostly resolved. PA made aware. Pt resting comfortably in bed. VSS. No acute distress noted. Skin appears dry and appropriate color.

## 2019-07-07 DIAGNOSIS — J189 Pneumonia, unspecified organism: Secondary | ICD-10-CM | POA: Diagnosis not present

## 2019-07-31 ENCOUNTER — Ambulatory Visit: Payer: PPO | Admitting: Cardiology

## 2019-08-19 DIAGNOSIS — Z6841 Body Mass Index (BMI) 40.0 and over, adult: Secondary | ICD-10-CM | POA: Diagnosis not present

## 2019-08-19 DIAGNOSIS — N3001 Acute cystitis with hematuria: Secondary | ICD-10-CM | POA: Diagnosis not present

## 2019-08-19 DIAGNOSIS — B9789 Other viral agents as the cause of diseases classified elsewhere: Secondary | ICD-10-CM | POA: Diagnosis not present

## 2019-08-19 DIAGNOSIS — B373 Candidiasis of vulva and vagina: Secondary | ICD-10-CM | POA: Diagnosis not present

## 2019-08-19 DIAGNOSIS — J019 Acute sinusitis, unspecified: Secondary | ICD-10-CM | POA: Diagnosis not present

## 2019-08-19 DIAGNOSIS — R7309 Other abnormal glucose: Secondary | ICD-10-CM | POA: Diagnosis not present

## 2019-08-26 NOTE — Progress Notes (Signed)
Cardiology Office Note:    Date:  08/27/2019   ID:  Joanna Alvarado, DOB 1956/04/24, MRN [id number]  PCP:  Joanna Hipps, MD  Cardiologist:  Joanna More, MD   Referring MD: [facility] [place]:    1. Chest pain of uncertain etiology   2. Type 2 diabetes mellitus without complication, without long-term current use of insulin ([place])   3. Essential hypertension   4. Mixed hyperlipidemia   5. Obstructive sleep apnea syndrome   6. Stage 3a chronic kidney disease   7. Chest pain, unspecified type    PLAN:    In order of problems listed above:  1. Her symptoms are atypical angina but she is a high risk category with diabetes renal insufficiency hypertension hyperlipidemia we discussed options for ischemia evaluation with her CKD we decided to perform a myocardial perfusion study if abnormal and strongly encourage coronary angiography.  Should her symptoms appear to be palpitation a 7-day ZIO monitor be performed looking for arrhythmia especially atrial fibrillation. 2. Poorly controlled 3. BP at target continue current treatment thiazide diuretic 4. Not present on lipid-lowering therapy, if perfusion study is abnormal she would require high intensity statin 5. Continue CPAP 6. Associated with diabetes  Next appointment 6 weeks   Medication Adjustments/Labs and Tests Ordered: Current medicines are reviewed at length with the patient today.  Concerns regarding medicines are outlined above.  Orders Placed This Encounter  Procedures  . LONG TERM MONITOR (3-14 DAYS)  . MYOCARDIAL PERFUSION IMAGING   No orders of the defined types were placed in this encounter.    Chief Complaint  Patient presents with  . Chest Pain    Recent ED visit 07/04/2019    History of Present Illness:    Joanna Alvarado is a 64 y.o. female with a history of hypertension, hyperlipidemia and type 2 diabetes who is being seen today for the evaluation of chest pain at the request of  [facility], [place]*.  She was seen at [facility] ED 07/04/2019 with chest pain.  Her EKG showed sinus rhythm right bundle branch block high-sensitivity troponins were low and the patient  felt to be low risk for acute coronary syndrome and discharge from the ED   1 mo ago  (07/04/19) 1 mo ago  (07/04/19)   Troponin I (High Sensitivity) <18 ng/L 10  8 CM    Records from her primary care physician showed  07/04/2019 she was seen with chest pain that was felt to be concerning for acute coronary syndrome her EKG was read as anteroseptal myocardial infarction left axis deviation left anterior hemiblock and she was directed to the emergency room for evaluation..   She is seen in the office at her request today regarding chest pain.  Her symptoms are unusual when you speak.  Sounds Alvarado like palpitation happens at nighttime she is wearing shoulder she said performed.  When she talks about having pressure in the chest and aching and at times with physical activity relieved with rest.  The episodes are not frequent not severe not typical predictable exertional but she is at high risk with her diabetes CKD hyperlipidemia hypertension and we discussed further evaluation.  With her CKD I think she is best served with a myocardial perfusion study be done in the office in a 7-day ZIO monitor to assess for arrhythmia.  She has no edema orthopnea or syncope no history of congenital rheumatic heart disease.  The chest pain does  not radiate she describes it as severe when it occurs typically lasts from 1 to 20 seconds and on 1 or 2 instances has been exertional.  There is no ration to the neck or extremities is not pleuritic and no associated nausea or vomiting Past Medical History:  Diagnosis Date  . Anemia   . Arthritis    KNEES  . Benign neoplasm of ascending colon   . Benign neoplasm of cecum   . Benign neoplasm of descending colon   . Benign neoplasm of sigmoid colon   . Benign neoplasm of  transverse colon   . Bowel habit changes   . Cataract    BILATERAL-REMOVED  . Chronic postoperative pain 12/02/2012  . Diabetes mellitus without complication ([place])   . Disturbance of skin sensation 12/02/2012  . Fibromyalgia   . GERD (gastroesophageal reflux disease)   . History of colonic polyps   . Hypertension   . Hypothyroidism   . Myalgia and myositis, unspecified 12/02/2012  . Personal history of colonic polyps   . Primary hypothyroidism 11/30/2015  . Senile nuclear sclerosis 04/24/2014  . Sleep apnea    uses c-pap  . Thyroid disease   . Thyroid nodule 11/30/2015  . Type 2 diabetes mellitus without complications ([place]) 123456  . Vitamin D deficiency 11/30/2015    Past Surgical History:  Procedure Laterality Date  . ABDOMINAL HYSTERECTOMY    . APPENDECTOMY    . CHOLECYSTECTOMY    . COLONOSCOPY N/A 07/27/2015   Procedure: COLONOSCOPY;  Surgeon: Irene Shipper, MD;  Location: [facility];  Service: Endoscopy;  Laterality: N/A;  . COLONOSCOPY WITH PROPOFOL N/A 04/28/2019   Procedure: COLONOSCOPY WITH PROPOFOL;  Surgeon: Irene Shipper, MD;  Location: [facility];  Service: Endoscopy;  Laterality: N/A;  . EYE SURGERY     bilateral cataracts with lens implants  . floater bone surgery     bone removed from right hand  . FOOT SURGERY     RIGHT  . left foot surgery     removed tendon and placed pins in foot  . OTHER SURGICAL HISTORY     Triple orthosesis with tendon release  . PLANTAR FASCIA SURGERY     both feet  . POLYPECTOMY  04/28/2019   Procedure: POLYPECTOMY;  Surgeon: Irene Shipper, MD;  Location: [facility];  Service: Endoscopy;;  . TONSILLECTOMY      Current Medications: Current Meds  Medication Sig  . acetaminophen (TYLENOL) 500 MG tablet Take 1,000 mg by mouth every 6 (six) hours as needed for moderate pain.  Marland Kitchen albuterol (PROVENTIL HFA;VENTOLIN HFA) 108 (90 Base) MCG/ACT inhaler Inhale 2 puffs into the lungs every 6 (six) hours as needed for shortness of  breath.  Marland Kitchen aspirin EC 81 MG tablet Take 81 mg by mouth daily.  . ciprofloxacin (CIPRO) 250 MG tablet Take 1 tablet by mouth 2 (two) times daily.  . diclofenac (CATAFLAM) 50 MG tablet Take 1 tablet by mouth daily.  . diclofenac (VOLTAREN) 75 MG EC tablet Take 75 mg by mouth daily.   . fluconazole (DIFLUCAN) 150 MG tablet Take 1 tablet by mouth every 3 (three) days.  Marland Kitchen levothyroxine (SYNTHROID, LEVOTHROID) 175 MCG tablet Take 175-262.5 mcg by mouth See admin instructions. Take 262.5 mg  on Monday and Friday Tale 175 mg t on Tuesday, Wednesday, Thursday, Friday, Saturday and Sunday  . LINZESS 290 MCG CAPS capsule Take 290 mcg by mouth daily.  Marland Kitchen losartan-hydrochlorothiazide (HYZAAR) 100-12.5 MG tablet Take 1 tablet by mouth  daily.   . metFORMIN (GLUCOPHAGE-XR) 500 MG 24 hr tablet Take 1,000 mg by mouth 2 (two) times daily.  . methocarbamol (ROBAXIN) 500 MG tablet Take 1 tablet (500 mg total) by mouth 2 (two) times daily.  Marland Kitchen nystatin cream (MYCOSTATIN) Apply 1 application topically 2 (two) times daily.  Marland Kitchen omeprazole (PRILOSEC) 40 MG capsule Take 40 mg by mouth daily.     Allergies:   Codeine, Hydrocodone-acetaminophen, Latex, Oxycontin [oxycodone hcl], Trulicity [dulaglutide], and Other   Social History   Socioeconomic History  . Marital status: Married    Spouse name: Not on file  . Number of children: Not on file  . Years of education: Not on file  . Highest education level: Not on file  Occupational History  . Not on file  Tobacco Use  . Smoking status: Never Smoker  . Smokeless tobacco: Never Used  Substance and Sexual Activity  . Alcohol use: No  . Drug use: No  . Sexual activity: Not on file  Other Topics Concern  . Not on file  Social History Narrative  . Not on file   Social Determinants of Health   Financial Resource Strain:   . Difficulty of Paying Living Expenses:   Food Insecurity:   . Worried About Charity fundraiser in the Last Year:   . Arboriculturist in the  Last Year:   Transportation Needs:   . Film/video editor (Medical):   Marland Kitchen Lack of Transportation (Non-Medical):   Physical Activity:   . Days of Exercise per Week:   . Minutes of Exercise per Session:   Stress:   . Feeling of Stress :   Social Connections:   . Frequency of Communication with Friends and Family:   . Frequency of Social Gatherings with Friends and Family:   . Attends Religious Services:   . Active Member of Clubs or Organizations:   . Attends Archivist Meetings:   Marland Kitchen Marital Status:      Family History: The patient's family history includes Colon polyps in her brother and brother; Diabetes in her mother; Heart disease in her mother; Hypertension in her father and mother. There is no history of Colon cancer, Esophageal cancer, Rectal cancer, or Stomach cancer.  ROS:   Review of Systems  Constitution: Positive for malaise/fatigue and weight loss.  HENT: Negative.   Eyes: Negative.   Cardiovascular: Positive for chest pain and palpitations.  Respiratory: Positive for shortness of breath.   Endocrine: Negative.   Hematologic/Lymphatic: Negative.   Skin: Negative.   Musculoskeletal: Positive for joint pain.  Gastrointestinal: Positive for constipation.  Genitourinary: Negative.   Neurological: Negative.   Psychiatric/Behavioral: Negative.   Allergic/Immunologic: Negative.    Please see the history of present illness.     All other systems reviewed and are negative.  EKGs/Labs/Other Studies Reviewed:    The following studies were reviewed today:   EKG:  EKG is  ordered today.  The ekg ordered today is personally reviewed and demonstrates sinus rhythm unchanged left axis deviation incomplete right bundle branch block  Recent Labs: 03/13/2018: Cholesterol 202 HDL 34 LDL 140  04/02/2019:  A1c 8.0  07/04/2019: Creatinine 1.23.  Physical Exam:    VS:  BP (!) 132/58   Pulse 98   Temp (!) 97.1 F (36.2 C)   Ht 5' 7.5" (1.715 m)   Wt (!) 348  lb 9.6 oz (158.1 kg)   SpO2 95%   BMI 53.79 kg/m  Wt Readings from Last 3 Encounters:  08/27/19 (!) 348 lb 9.6 oz (158.1 kg)  04/28/19 (!) 360 lb 10.8 oz (163.6 kg)  04/23/19 (!) 360 lb 9.6 oz (163.6 kg)     GEN:  Well nourished, well developed in no acute distress HEENT: Normal NECK: No JVD; No carotid bruits LYMPHATICS: No lymphadenopathy CARDIAC: RRR, no murmurs, rubs, gallops RESPIRATORY:  Clear to auscultation without rales, wheezing or rhonchi  ABDOMEN: Soft, non-tender, non-distended MUSCULOSKELETAL:  No edema; No deformity  SKIN: Warm and dry NEUROLOGIC:  Alert and oriented x 3 PSYCHIATRIC:  Normal affect     Signed, Joanna More, MD  08/27/2019 2:45 PM    [facility]

## 2019-08-27 ENCOUNTER — Ambulatory Visit: Payer: PPO | Admitting: Cardiology

## 2019-08-27 ENCOUNTER — Ambulatory Visit (INDEPENDENT_AMBULATORY_CARE_PROVIDER_SITE_OTHER): Payer: PPO

## 2019-08-27 ENCOUNTER — Other Ambulatory Visit: Payer: Self-pay

## 2019-08-27 VITALS — BP 132/58 | HR 98 | Temp 97.1°F | Ht 67.5 in | Wt 348.6 lb

## 2019-08-27 DIAGNOSIS — G4733 Obstructive sleep apnea (adult) (pediatric): Secondary | ICD-10-CM

## 2019-08-27 DIAGNOSIS — R079 Chest pain, unspecified: Secondary | ICD-10-CM

## 2019-08-27 DIAGNOSIS — E782 Mixed hyperlipidemia: Secondary | ICD-10-CM

## 2019-08-27 DIAGNOSIS — N183 Chronic kidney disease, stage 3 unspecified: Secondary | ICD-10-CM | POA: Insufficient documentation

## 2019-08-27 DIAGNOSIS — I10 Essential (primary) hypertension: Secondary | ICD-10-CM | POA: Diagnosis not present

## 2019-08-27 DIAGNOSIS — N1831 Chronic kidney disease, stage 3a: Secondary | ICD-10-CM | POA: Diagnosis not present

## 2019-08-27 DIAGNOSIS — E119 Type 2 diabetes mellitus without complications: Secondary | ICD-10-CM

## 2019-08-27 NOTE — Patient Instructions (Signed)
Medication Instructions:  Start: Aspirin 81 mg daily   *If you need a refill on your cardiac medications before your next appointment, please call your pharmacy*   Lab Work: None ordered   If you have labs (blood work) drawn today and your tests are completely normal, you will receive your results only by: Marland Kitchen MyChart Message (if you have MyChart) OR . A paper copy in the mail If you have any lab test that is abnormal or we need to change your treatment, we will call you to review the results.   Testing/Procedures: Your physician has requested that you have a lexiscan myoview. For further information please visit [email or web address]. Please follow instruction sheet, as given.  A zio monitor was ordered today. It will remain on for 7 days. You will then return monitor and event diary in provided box. It takes 1-2 weeks for report to be downloaded and returned to [place]. We will call you with the results. If monitor falls off or has orange flashing light, please call Zio for further instructions.    Follow-Up: At [facility], you and your health needs are our priority.  As part of our continuing mission to provide you with exceptional heart care, we have created designated Provider Care Teams.  These Care Teams include your primary Cardiologist (physician) and Advanced Practice Providers (APPs -  Physician Assistants and Nurse Practitioners) who all work together to provide you with the care you need, when you need it.  We recommend signing up for the patient portal called "MyChart".  Sign up information is provided on this After Visit Summary.  MyChart is used to connect with patients for Virtual Visits (Telemedicine).  Patients are able to view lab/test results, encounter notes, upcoming appointments, etc.  Non-urgent messages can be sent to your provider as well.   To learn more about what you can do with MyChart, go to [email or web address].    Your next appointment:   6 week(s)  The  format for your next appointment:   In Person  Provider:   Shirlee More, MD   Other Instructions None

## 2019-09-10 ENCOUNTER — Telehealth (HOSPITAL_COMMUNITY): Payer: Self-pay | Admitting: *Deleted

## 2019-09-10 ENCOUNTER — Encounter (HOSPITAL_COMMUNITY): Payer: Self-pay | Admitting: *Deleted

## 2019-09-10 NOTE — Telephone Encounter (Signed)
Left message on voicemail in reference to upcoming appointment scheduled for 09/17/19. Phone number given for a call back so details instructions can be given. Joanna Alvarado

## 2019-09-11 ENCOUNTER — Telehealth (HOSPITAL_COMMUNITY): Payer: Self-pay | Admitting: *Deleted

## 2019-09-11 NOTE — Telephone Encounter (Signed)
Patient given detailed instructions per Myocardial Perfusion Study Information Sheet for the test on 09/17/19 Patient notified to arrive 15 minutes early and that it is imperative to arrive on time for appointment to keep from having the test rescheduled.  If you need to cancel or reschedule your appointment, please call the office within 24 hours of your appointment. . Patient verbalized understanding. Kirstie Peri

## 2019-09-12 DIAGNOSIS — E039 Hypothyroidism, unspecified: Secondary | ICD-10-CM | POA: Diagnosis not present

## 2019-09-12 DIAGNOSIS — E559 Vitamin D deficiency, unspecified: Secondary | ICD-10-CM | POA: Diagnosis not present

## 2019-09-12 DIAGNOSIS — R079 Chest pain, unspecified: Secondary | ICD-10-CM | POA: Diagnosis not present

## 2019-09-12 DIAGNOSIS — E119 Type 2 diabetes mellitus without complications: Secondary | ICD-10-CM | POA: Diagnosis not present

## 2019-09-12 DIAGNOSIS — E041 Nontoxic single thyroid nodule: Secondary | ICD-10-CM | POA: Diagnosis not present

## 2019-09-17 ENCOUNTER — Other Ambulatory Visit: Payer: Self-pay

## 2019-09-17 ENCOUNTER — Ambulatory Visit (INDEPENDENT_AMBULATORY_CARE_PROVIDER_SITE_OTHER): Payer: PPO

## 2019-09-17 DIAGNOSIS — R079 Chest pain, unspecified: Secondary | ICD-10-CM | POA: Diagnosis not present

## 2019-09-18 ENCOUNTER — Ambulatory Visit: Payer: PPO

## 2019-09-18 LAB — MYOCARDIAL PERFUSION IMAGING
LV dias vol: 147 mL (ref 46–106)
LV sys vol: 58 mL
Peak HR: 104 {beats}/min
Rest HR: 85 {beats}/min
SDS: 0
SRS: 13
SSS: 13
TID: 0.78

## 2019-09-18 MED ORDER — TECHNETIUM TC 99M TETROFOSMIN IV KIT
30.3000 | PACK | Freq: Once | INTRAVENOUS | Status: AC | PRN
  Administered 2019-09-17: 30.3 via INTRAVENOUS

## 2019-09-18 MED ORDER — TECHNETIUM TC 99M TETROFOSMIN IV KIT
30.2000 | PACK | Freq: Once | INTRAVENOUS | Status: AC | PRN
  Administered 2019-09-18: 30.2 via INTRAVENOUS

## 2019-09-18 MED ORDER — REGADENOSON 0.4 MG/5ML IV SOLN
0.4000 mg | Freq: Once | INTRAVENOUS | Status: AC
  Administered 2019-09-17: 0.4 mg via INTRAVENOUS

## 2019-09-19 ENCOUNTER — Telehealth: Payer: Self-pay

## 2019-09-19 ENCOUNTER — Other Ambulatory Visit: Payer: Self-pay

## 2019-09-19 NOTE — Telephone Encounter (Signed)
The patient has been notified of the result and verbalized understanding.  All questions (if any) were answered.  Appointment made for 4/19 at 1:55pm Wilma Flavin, RN 09/19/2019 8:59 AM

## 2019-09-19 NOTE — Telephone Encounter (Signed)
Left message on patients voicemail to please return our call.   

## 2019-09-19 NOTE — Telephone Encounter (Signed)
-----   Message from Richardo Priest, MD sent at 09/19/2019  8:18 AM EDT ----- Her stress test is abnormal I think she needs to undergo a heart catheterization and I would like to see her in the office next week double book force into my schedule

## 2019-09-21 NOTE — Progress Notes (Signed)
Cardiology Office Note:    Date:  09/22/2019   ID:  Joanna Alvarado, DOB 08-05-1955, MRN [id number]  PCP:  Joanna Hipps, MD  Cardiologist:  Joanna More, MD    Referring MD: Joanna Hipps, MD    ASSESSMENT:    1. Abnormal myocardial perfusion study   2. Chest pain of uncertain etiology   3. Type 2 diabetes mellitus without complication, without long-term current use of insulin ([place])   4. Essential hypertension   5. Mixed hyperlipidemia    PLAN:    In order of problems listed above:  1. Clinically she has high risk recent ED visit with a heart score for abnormal myocardial perfusion study and ongoing chest pain.  Refer to coronary angiography initiate beta-blocker nitroglycerin as needed and statin and she understands that if she has flow-limiting stenosis will need revascularization.  She has had both doses of vaccine. 2. Stable diabetes on oral medications 3. BP at target continue current treatment 4. Initiate lipid-lowering therapy with statin.   Next appointment: 6 weeks for follow-up   Medication Adjustments/Labs and Tests Ordered: Current medicines are reviewed at length with the patient today.  Concerns regarding medicines are outlined above.  No orders of the defined types were placed in this encounter.  No orders of the defined types were placed in this encounter.   No chief complaint on file.   History of Present Illness:    Joanna Alvarado is a 64 y.o. female with a hx of hypertension hyperlipidemia type 2 diabetes mellitus last seen 08/27/2019 with chest pain after [facility] ED visits 07/04/2019.Marland Kitchen  She had a myocardial perfusion study performed 09/17/2019 basal and mid inferior moderate severity perfusion defect with hypokinesia representing either severe ischemia or prior infarction with evidence of peri-infarct ischemia.  Event monitor which showed no significant arrhythmia.  The heart score at the ED visit was 4 intermediate risk.  Last BMP was  mildly elevated 2 months ago 1.23 with a GFR of 46 cc/min stage III CKD this was stable compared to 1 year previously.  Compliance with diet, lifestyle and medications: Yes  She has good healthcare literacy she looked at her record and reviewed with patient and her husband I think she has impressive right coronary artery ischemia and advised to undergo coronary angiography.  The option is intense medical treatment options benefits and risks detailed and she agrees to proceed she has CKD with diabetes but her renal function is stable she is not allergic to contrast dye no contraindication to dual antiplatelet therapy and she is compliant with medications and follow-up.  She has had several episodes of chest pressure without activity relieved at rest will start on a beta-blocker as well as a statin and a prescription for nitroglycerin. Her husband was present and participated in the discussion and decision making.  Study Highlights Personally reviewed the images on the day of the visit and agree with the interpretation  Nuclear stress EF: 61%.  The left ventricular ejection fraction is normal (55-65%).  No T wave inversion was noted during stress.  There was no ST segment deviation noted during stress.  Defect 1: There is a medium defect of moderate severity present in the basal inferior and mid inferior location. There is mild hypokinesis of the basal-mid inferior wall.  Findings consistent with prior myocardial infarction with no evidence of peri-infarct ischemia.  This is a low risk study.   Stress Findings ECG Baseline ECG exhibits normal sinus rhythm.Marland Kitchen  At baseline, ECG shows pathologic Q wave in 2 or Alvarado leads.  Stress Findings A pharmacological stress test was performed using IV Lexiscan 0.4mg  over 10 seconds performed without concurrent submaximal exercise.    Test was stopped per protocol.    Recovery time:  5 minutes.  Response to Stress There was no ST segment deviation  noted during stress.  No T wave inversion was noted during stress.  Arrhythmias during stress:  sinus tachycardia.   Arrhythmias during recovery:  none.     There were no significant arrhythmias noted during the test.   ECG was interpretable and there was no significant change from baseline.    Past Medical History:  Diagnosis Date  . Anemia   . Arthritis    KNEES  . Benign neoplasm of ascending colon   . Benign neoplasm of cecum   . Benign neoplasm of descending colon   . Benign neoplasm of sigmoid colon   . Benign neoplasm of transverse colon   . Bowel habit changes   . Cataract    BILATERAL-REMOVED  . Chronic postoperative pain 12/02/2012  . Diabetes mellitus without complication ([place])   . Disturbance of skin sensation 12/02/2012  . Fibromyalgia   . GERD (gastroesophageal reflux disease)   . History of colonic polyps   . Hypertension   . Hypothyroidism   . Myalgia and myositis, unspecified 12/02/2012  . Personal history of colonic polyps   . Primary hypothyroidism 11/30/2015  . Senile nuclear sclerosis 04/24/2014  . Sleep apnea    uses c-pap  . Thyroid disease   . Thyroid nodule 11/30/2015  . Type 2 diabetes mellitus without complications ([place]) 123456  . Vitamin D deficiency 11/30/2015    Past Surgical History:  Procedure Laterality Date  . ABDOMINAL HYSTERECTOMY    . APPENDECTOMY    . CHOLECYSTECTOMY    . COLONOSCOPY N/A 07/27/2015   Procedure: COLONOSCOPY;  Surgeon: Irene Shipper, MD;  Location: [facility];  Service: Endoscopy;  Laterality: N/A;  . COLONOSCOPY WITH PROPOFOL N/A 04/28/2019   Procedure: COLONOSCOPY WITH PROPOFOL;  Surgeon: Irene Shipper, MD;  Location: [facility];  Service: Endoscopy;  Laterality: N/A;  . EYE SURGERY     bilateral cataracts with lens implants  . floater bone surgery     bone removed from right hand  . FOOT SURGERY     RIGHT  . left foot surgery     removed tendon and placed pins in foot  . OTHER SURGICAL HISTORY     Triple  orthosesis with tendon release  . PLANTAR FASCIA SURGERY     both feet  . POLYPECTOMY  04/28/2019   Procedure: POLYPECTOMY;  Surgeon: Irene Shipper, MD;  Location: [facility];  Service: Endoscopy;;  . TONSILLECTOMY      Current Medications: Current Meds  Medication Sig  . acetaminophen (TYLENOL) 500 MG tablet Take 1,000 mg by mouth every 6 (six) hours as needed for moderate pain.  Marland Kitchen aspirin EC 81 MG tablet Take 81 mg by mouth daily.  . budesonide-formoterol (SYMBICORT) 80-4.5 MCG/ACT inhaler Inhale 2 puffs into the lungs 2 (two) times daily as needed.  . diclofenac (CATAFLAM) 50 MG tablet Take 1 tablet by mouth daily.  . ergocalciferol (VITAMIN D2) 1.25 MG (50000 UT) capsule Take 1 capsule by mouth. For a total of 2 weeks  . levothyroxine (SYNTHROID, LEVOTHROID) 175 MCG tablet Take 175-262.5 mcg by mouth See admin instructions. Take 262.5 mg  on Monday and Friday Tale 175  mg t on Tuesday, Wednesday, Thursday, Friday, Saturday and Sunday  . linaclotide (LINZESS) 290 MCG CAPS capsule Take 290 mcg by mouth daily as needed.  Marland Kitchen losartan-hydrochlorothiazide (HYZAAR) 100-12.5 MG tablet Take 1 tablet by mouth daily.   . metFORMIN (GLUCOPHAGE-XR) 500 MG 24 hr tablet Take 1,000 mg by mouth 2 (two) times daily.  Marland Kitchen nystatin cream (MYCOSTATIN) Apply 1 application topically 2 (two) times daily.  Marland Kitchen omeprazole (PRILOSEC) 40 MG capsule Take 40 mg by mouth daily.  . Semaglutide (OZEMPIC, 0.25 OR 0.5 MG/DOSE, [place]) Inject 0.25 mg into the skin once a week.     Allergies:   Codeine, Hydrocodone-acetaminophen, Latex, Oxycontin [oxycodone hcl], Trulicity [dulaglutide], and Other   Social History   Socioeconomic History  . Marital status: Married    Spouse name: Not on file  . Number of children: Not on file  . Years of education: Not on file  . Highest education level: Not on file  Occupational History  . Not on file  Tobacco Use  . Smoking status: Never Smoker  . Smokeless tobacco: Never Used    Substance and Sexual Activity  . Alcohol use: No  . Drug use: No  . Sexual activity: Not on file  Other Topics Concern  . Not on file  Social History Narrative  . Not on file   Social Determinants of Health   Financial Resource Strain:   . Difficulty of Paying Living Expenses:   Food Insecurity:   . Worried About Charity fundraiser in the Last Year:   . Arboriculturist in the Last Year:   Transportation Needs:   . Film/video editor (Medical):   Marland Kitchen Lack of Transportation (Non-Medical):   Physical Activity:   . Days of Exercise per Week:   . Minutes of Exercise per Session:   Stress:   . Feeling of Stress :   Social Connections:   . Frequency of Communication with Friends and Family:   . Frequency of Social Gatherings with Friends and Family:   . Attends Religious Services:   . Active Member of Clubs or Organizations:   . Attends Archivist Meetings:   Marland Kitchen Marital Status:      Family History: The patient's family history includes Colon polyps in her brother and brother; Diabetes in her mother; Heart disease in her mother; Hypertension in her father and mother. There is no history of Colon cancer, Esophageal cancer, Rectal cancer, or Stomach cancer. ROS:   Please see the history of present illness.    All other systems reviewed and are negative.  EKGs/Labs/Other Studies Reviewed:    The following studies were reviewed today:  EKG today sinus rhythm left axis deviation left anterior hemiblock.  QT interval is minimally prolonged. Recent Labs: 07/04/2019: BUN 15; Creatinine, Ser 1.23; Hemoglobin 14.2; Platelets 117; Potassium 4.4; Sodium 135  Recent Lipid Panel No results found for: CHOL, TRIG, HDL, CHOLHDL, VLDL, LDLCALC, LDLDIRECT  Physical Exam:    VS:  BP 140/62 (BP Location: Right Arm, Patient Position: Sitting, Cuff Size: Normal)   Pulse 96   Temp 98.5 F (36.9 C)   Ht 5\' 7"  (1.702 m)   Wt (!) 344 lb 3.2 oz (156.1 kg)   SpO2 96%   BMI 53.91  kg/m     Wt Readings from Last 3 Encounters:  09/22/19 (!) 344 lb 3.2 oz (156.1 kg)  09/17/19 (!) 348 lb (157.9 kg)  08/27/19 (!) 348 lb 9.6 oz (158.1 kg)  GEN:  Well nourished, well developed in no acute distress HEENT: Normal NECK: No JVD; No carotid bruits LYMPHATICS: No lymphadenopathy CARDIAC: RRR, no murmurs, rubs, gallops RESPIRATORY:  Clear to auscultation without rales, wheezing or rhonchi  ABDOMEN: Soft, non-tender, non-distended MUSCULOSKELETAL:  No edema; No deformity  SKIN: Warm and dry NEUROLOGIC:  Alert and oriented x 3 PSYCHIATRIC:  Normal affect    Signed, Joanna More, MD  09/22/2019 2:14 PM    [facility]

## 2019-09-21 NOTE — H&P (View-Only) (Signed)
Cardiology Office Note:    Date:  09/22/2019   ID:  Joanna Alvarado, DOB December 15, 1955, MRN [id number]  PCP:  Ronita Hipps, MD  Cardiologist:  Shirlee More, MD    Referring MD: Ronita Hipps, MD    ASSESSMENT:    1. Abnormal myocardial perfusion study   2. Chest pain of uncertain etiology   3. Type 2 diabetes mellitus without complication, without long-term current use of insulin ([place])   4. Essential hypertension   5. Mixed hyperlipidemia    PLAN:    In order of problems listed above:  1. Clinically she has high risk recent ED visit with a heart score for abnormal myocardial perfusion study and ongoing chest pain.  Refer to coronary angiography initiate beta-blocker nitroglycerin as needed and statin and she understands that if she has flow-limiting stenosis will need revascularization.  She has had both doses of vaccine. 2. Stable diabetes on oral medications 3. BP at target continue current treatment 4. Initiate lipid-lowering therapy with statin.   Next appointment: 6 weeks for follow-up   Medication Adjustments/Labs and Tests Ordered: Current medicines are reviewed at length with the patient today.  Concerns regarding medicines are outlined above.  No orders of the defined types were placed in this encounter.  No orders of the defined types were placed in this encounter.   No chief complaint on file.   History of Present Illness:    Joanna Alvarado is a 64 y.o. female with a hx of hypertension hyperlipidemia type 2 diabetes mellitus last seen 08/27/2019 with chest pain after [facility] ED visits 07/04/2019.Marland Kitchen  She had a myocardial perfusion study performed 09/17/2019 basal and mid inferior moderate severity perfusion defect with hypokinesia representing either severe ischemia or prior infarction with evidence of peri-infarct ischemia.  Event monitor which showed no significant arrhythmia.  The heart score at the ED visit was 4 intermediate risk.  Last BMP was  mildly elevated 2 months ago 1.23 with a GFR of 46 cc/min stage III CKD this was stable compared to 1 year previously.  Compliance with diet, lifestyle and medications: Yes  She has good healthcare literacy she looked at her record and reviewed with patient and her husband I think she has impressive right coronary artery ischemia and advised to undergo coronary angiography.  The option is intense medical treatment options benefits and risks detailed and she agrees to proceed she has CKD with diabetes but her renal function is stable she is not allergic to contrast dye no contraindication to dual antiplatelet therapy and she is compliant with medications and follow-up.  She has had several episodes of chest pressure without activity relieved at rest will start on a beta-blocker as well as a statin and a prescription for nitroglycerin. Her husband was present and participated in the discussion and decision making.  Study Highlights Personally reviewed the images on the day of the visit and agree with the interpretation  Nuclear stress EF: 61%.  The left ventricular ejection fraction is normal (55-65%).  No T wave inversion was noted during stress.  There was no ST segment deviation noted during stress.  Defect 1: There is a medium defect of moderate severity present in the basal inferior and mid inferior location. There is mild hypokinesis of the basal-mid inferior wall.  Findings consistent with prior myocardial infarction with no evidence of peri-infarct ischemia.  This is a low risk study.   Stress Findings ECG Baseline ECG exhibits normal sinus rhythm.Marland Kitchen  At baseline, ECG shows pathologic Q wave in 2 or more leads.  Stress Findings A pharmacological stress test was performed using IV Lexiscan 0.4mg  over 10 seconds performed without concurrent submaximal exercise.    Test was stopped per protocol.    Recovery time:  5 minutes.  Response to Stress There was no ST segment deviation  noted during stress.  No T wave inversion was noted during stress.  Arrhythmias during stress:  sinus tachycardia.   Arrhythmias during recovery:  none.     There were no significant arrhythmias noted during the test.   ECG was interpretable and there was no significant change from baseline.    Past Medical History:  Diagnosis Date  . Anemia   . Arthritis    KNEES  . Benign neoplasm of ascending colon   . Benign neoplasm of cecum   . Benign neoplasm of descending colon   . Benign neoplasm of sigmoid colon   . Benign neoplasm of transverse colon   . Bowel habit changes   . Cataract    BILATERAL-REMOVED  . Chronic postoperative pain 12/02/2012  . Diabetes mellitus without complication ([place])   . Disturbance of skin sensation 12/02/2012  . Fibromyalgia   . GERD (gastroesophageal reflux disease)   . History of colonic polyps   . Hypertension   . Hypothyroidism   . Myalgia and myositis, unspecified 12/02/2012  . Personal history of colonic polyps   . Primary hypothyroidism 11/30/2015  . Senile nuclear sclerosis 04/24/2014  . Sleep apnea    uses c-pap  . Thyroid disease   . Thyroid nodule 11/30/2015  . Type 2 diabetes mellitus without complications ([place]) 123456  . Vitamin D deficiency 11/30/2015    Past Surgical History:  Procedure Laterality Date  . ABDOMINAL HYSTERECTOMY    . APPENDECTOMY    . CHOLECYSTECTOMY    . COLONOSCOPY N/A 07/27/2015   Procedure: COLONOSCOPY;  Surgeon: Irene Shipper, MD;  Location: [facility];  Service: Endoscopy;  Laterality: N/A;  . COLONOSCOPY WITH PROPOFOL N/A 04/28/2019   Procedure: COLONOSCOPY WITH PROPOFOL;  Surgeon: Irene Shipper, MD;  Location: [facility];  Service: Endoscopy;  Laterality: N/A;  . EYE SURGERY     bilateral cataracts with lens implants  . floater bone surgery     bone removed from right hand  . FOOT SURGERY     RIGHT  . left foot surgery     removed tendon and placed pins in foot  . OTHER SURGICAL HISTORY     Triple  orthosesis with tendon release  . PLANTAR FASCIA SURGERY     both feet  . POLYPECTOMY  04/28/2019   Procedure: POLYPECTOMY;  Surgeon: Irene Shipper, MD;  Location: [facility];  Service: Endoscopy;;  . TONSILLECTOMY      Current Medications: Current Meds  Medication Sig  . acetaminophen (TYLENOL) 500 MG tablet Take 1,000 mg by mouth every 6 (six) hours as needed for moderate pain.  Marland Kitchen aspirin EC 81 MG tablet Take 81 mg by mouth daily.  . budesonide-formoterol (SYMBICORT) 80-4.5 MCG/ACT inhaler Inhale 2 puffs into the lungs 2 (two) times daily as needed.  . diclofenac (CATAFLAM) 50 MG tablet Take 1 tablet by mouth daily.  . ergocalciferol (VITAMIN D2) 1.25 MG (50000 UT) capsule Take 1 capsule by mouth. For a total of 2 weeks  . levothyroxine (SYNTHROID, LEVOTHROID) 175 MCG tablet Take 175-262.5 mcg by mouth See admin instructions. Take 262.5 mg  on Monday and Friday Tale 175  mg t on Tuesday, Wednesday, Thursday, Friday, Saturday and Sunday  . linaclotide (LINZESS) 290 MCG CAPS capsule Take 290 mcg by mouth daily as needed.  Marland Kitchen losartan-hydrochlorothiazide (HYZAAR) 100-12.5 MG tablet Take 1 tablet by mouth daily.   . metFORMIN (GLUCOPHAGE-XR) 500 MG 24 hr tablet Take 1,000 mg by mouth 2 (two) times daily.  Marland Kitchen nystatin cream (MYCOSTATIN) Apply 1 application topically 2 (two) times daily.  Marland Kitchen omeprazole (PRILOSEC) 40 MG capsule Take 40 mg by mouth daily.  . Semaglutide (OZEMPIC, 0.25 OR 0.5 MG/DOSE, [place]) Inject 0.25 mg into the skin once a week.     Allergies:   Codeine, Hydrocodone-acetaminophen, Latex, Oxycontin [oxycodone hcl], Trulicity [dulaglutide], and Other   Social History   Socioeconomic History  . Marital status: Married    Spouse name: Not on file  . Number of children: Not on file  . Years of education: Not on file  . Highest education level: Not on file  Occupational History  . Not on file  Tobacco Use  . Smoking status: Never Smoker  . Smokeless tobacco: Never Used    Substance and Sexual Activity  . Alcohol use: No  . Drug use: No  . Sexual activity: Not on file  Other Topics Concern  . Not on file  Social History Narrative  . Not on file   Social Determinants of Health   Financial Resource Strain:   . Difficulty of Paying Living Expenses:   Food Insecurity:   . Worried About Charity fundraiser in the Last Year:   . Arboriculturist in the Last Year:   Transportation Needs:   . Film/video editor (Medical):   Marland Kitchen Lack of Transportation (Non-Medical):   Physical Activity:   . Days of Exercise per Week:   . Minutes of Exercise per Session:   Stress:   . Feeling of Stress :   Social Connections:   . Frequency of Communication with Friends and Family:   . Frequency of Social Gatherings with Friends and Family:   . Attends Religious Services:   . Active Member of Clubs or Organizations:   . Attends Archivist Meetings:   Marland Kitchen Marital Status:      Family History: The patient's family history includes Colon polyps in her brother and brother; Diabetes in her mother; Heart disease in her mother; Hypertension in her father and mother. There is no history of Colon cancer, Esophageal cancer, Rectal cancer, or Stomach cancer. ROS:   Please see the history of present illness.    All other systems reviewed and are negative.  EKGs/Labs/Other Studies Reviewed:    The following studies were reviewed today:  EKG today sinus rhythm left axis deviation left anterior hemiblock.  QT interval is minimally prolonged. Recent Labs: 07/04/2019: BUN 15; Creatinine, Ser 1.23; Hemoglobin 14.2; Platelets 117; Potassium 4.4; Sodium 135  Recent Lipid Panel No results found for: CHOL, TRIG, HDL, CHOLHDL, VLDL, LDLCALC, LDLDIRECT  Physical Exam:    VS:  BP 140/62 (BP Location: Right Arm, Patient Position: Sitting, Cuff Size: Normal)   Pulse 96   Temp 98.5 F (36.9 C)   Ht 5\' 7"  (1.702 m)   Wt (!) 344 lb 3.2 oz (156.1 kg)   SpO2 96%   BMI 53.91  kg/m     Wt Readings from Last 3 Encounters:  09/22/19 (!) 344 lb 3.2 oz (156.1 kg)  09/17/19 (!) 348 lb (157.9 kg)  08/27/19 (!) 348 lb 9.6 oz (158.1 kg)  GEN:  Well nourished, well developed in no acute distress HEENT: Normal NECK: No JVD; No carotid bruits LYMPHATICS: No lymphadenopathy CARDIAC: RRR, no murmurs, rubs, gallops RESPIRATORY:  Clear to auscultation without rales, wheezing or rhonchi  ABDOMEN: Soft, non-tender, non-distended MUSCULOSKELETAL:  No edema; No deformity  SKIN: Warm and dry NEUROLOGIC:  Alert and oriented x 3 PSYCHIATRIC:  Normal affect    Signed, Shirlee More, MD  09/22/2019 2:14 PM    [facility]

## 2019-09-22 ENCOUNTER — Ambulatory Visit: Payer: PPO | Admitting: Cardiology

## 2019-09-22 ENCOUNTER — Encounter: Payer: Self-pay | Admitting: Cardiology

## 2019-09-22 ENCOUNTER — Other Ambulatory Visit: Payer: Self-pay

## 2019-09-22 VITALS — BP 140/62 | HR 96 | Temp 98.5°F | Ht 67.0 in | Wt 344.2 lb

## 2019-09-22 DIAGNOSIS — E782 Mixed hyperlipidemia: Secondary | ICD-10-CM | POA: Diagnosis not present

## 2019-09-22 DIAGNOSIS — R9439 Abnormal result of other cardiovascular function study: Secondary | ICD-10-CM | POA: Diagnosis not present

## 2019-09-22 DIAGNOSIS — R079 Chest pain, unspecified: Secondary | ICD-10-CM | POA: Diagnosis not present

## 2019-09-22 DIAGNOSIS — E119 Type 2 diabetes mellitus without complications: Secondary | ICD-10-CM

## 2019-09-22 DIAGNOSIS — I10 Essential (primary) hypertension: Secondary | ICD-10-CM

## 2019-09-22 MED ORDER — ROSUVASTATIN CALCIUM 5 MG PO TABS: 5.0000 mg | ORAL_TABLET | Freq: Every day | ORAL | 3 refills | Status: DC

## 2019-09-22 MED ORDER — METOPROLOL SUCCINATE ER 25 MG PO TB24: 25.0000 mg | ORAL_TABLET | Freq: Two times a day (BID) | ORAL | 3 refills | Status: DC

## 2019-09-22 MED ORDER — NITROGLYCERIN 0.4 MG SL SUBL: 0.4000 mg | SUBLINGUAL_TABLET | SUBLINGUAL | 3 refills | Status: DC | PRN

## 2019-09-22 NOTE — Telephone Encounter (Signed)
Patient arrived at appointment today with Dr. Bettina Gavia to discuss these results and recommendations.

## 2019-09-22 NOTE — Patient Instructions (Signed)
Medication Instructions:  Your physician has recommended you make the following change in your medication:  START: Toprol XL 25 mg take one tablet by mouth twice daily.  START: Rosuvastatin 5 mg take one tablet by mouth daily. START:  Nitroglycerin 0.4 mg take one tablet by mouth every five minutes as needed for chest pain. *If you need a refill on your cardiac medications before your next appointment, please call your pharmacy*   Lab Work: Your physician recommends that you return for lab work in: TODAY CBC, BMP If you have labs (blood work) drawn today and your tests are completely normal, you will receive your results only by: Marland Kitchen MyChart Message (if you have MyChart) OR . A paper copy in the mail If you have any lab test that is abnormal or we need to change your treatment, we will call you to review the results.   Testing/Procedures:    [facility] [place] [id number] Dept: [phone] Loc: [place]  09/22/2019  You are scheduled for a Cardiac Catheterization on Friday, April 23 with Dr. Glenetta Hew.  1. Please arrive at the [facility] (Main Entrance A) at [facility]: [address] [place], [place] [address] at 10:00 AM (This time is two hours before your procedure to ensure your preparation). Free valet parking service is available.   Special note: Every effort is made to have your procedure done on time. Please understand that emergencies sometimes delay scheduled procedures.  2. Diet: Do not eat solid foods after midnight.  The patient may have clear liquids until 5am upon the day of the procedure.  3. Labs: You will need to have blood drawn on TODAY  4. Medication instructions in preparation for your procedure:   Contrast Allergy: No   Do not take Diabetes Med Glucophage (Metformin) on the day of the procedure and HOLD 48 HOURS AFTER THE  PROCEDURE.  On the morning of your procedure, take your Aspirin and any morning medicines NOT listed above.  You may use sips of water.  5. Plan for one night stay--bring personal belongings. 6. Bring a current list of your medications and current insurance cards. 7. You MUST have a responsible person to drive you home. 8. Someone MUST be with you the first 24 hours after you arrive home or your discharge will be delayed. 9. Please wear clothes that are easy to get on and off and wear slip-on shoes.  Thank you for allowing [place] to care for you!   -- [address] [facility] services    Follow-Up: At [facility], you and your health needs are our priority.  As part of our continuing mission to provide you with exceptional heart care, we have created designated Provider Care Teams.  These Care Teams include your primary Cardiologist (physician) and Advanced Practice Providers (APPs -  Physician Assistants and Nurse Practitioners) who all work together to provide you with the care you need, when you need it.  We recommend signing up for the patient portal called "MyChart".  Sign up information is provided on this After Visit Summary.  MyChart is used to connect with patients for Virtual Visits (Telemedicine).  Patients are able to view lab/test results, encounter notes, upcoming appointments, etc.  Non-urgent messages can be sent to your provider as well.   To learn more about what you can do with MyChart, go to [email or web address].  Your next appointment:   6 week(s)  The format for your next appointment:   In Person  Provider:   Shirlee More, MD   Other Instructions

## 2019-09-22 NOTE — Telephone Encounter (Signed)
Left message on patients voicemail to please return our call.   

## 2019-09-23 ENCOUNTER — Telehealth: Payer: Self-pay

## 2019-09-23 LAB — CBC
Hematocrit: 43.1 % (ref 34.0–46.6)
Hemoglobin: 14.5 g/dL (ref 11.1–15.9)
MCH: 31.3 pg (ref 26.6–33.0)
MCHC: 33.6 g/dL (ref 31.5–35.7)
MCV: 93 fL (ref 79–97)
Platelets: 134 10*3/uL — ABNORMAL LOW (ref 150–450)
RBC: 4.64 x10E6/uL (ref 3.77–5.28)
RDW: 13.4 % (ref 11.7–15.4)
WBC: 6.3 10*3/uL (ref 3.4–10.8)

## 2019-09-23 LAB — BASIC METABOLIC PANEL
BUN/Creatinine Ratio: 14 (ref 12–28)
BUN: 14 mg/dL (ref 8–27)
CO2: 19 mmol/L — ABNORMAL LOW (ref 20–29)
Calcium: 10.3 mg/dL (ref 8.7–10.3)
Chloride: 105 mmol/L (ref 96–106)
Creatinine, Ser: 1.03 mg/dL — ABNORMAL HIGH (ref 0.57–1.00)
GFR calc Af Amer: 66 mL/min/{1.73_m2} (ref >59)
GFR calc non Af Amer: 58 mL/min/{1.73_m2} — ABNORMAL LOW (ref >59)
Glucose: 184 mg/dL — ABNORMAL HIGH (ref 65–99)
Potassium: 3.8 mmol/L (ref 3.5–5.2)
Sodium: 141 mmol/L (ref 134–144)

## 2019-09-23 NOTE — Telephone Encounter (Signed)
Spoke with patient regarding results.  Patient verbalizes understanding and is agreeable to plan of care. Advised patient to call back with any issues or concerns.  

## 2019-09-23 NOTE — Telephone Encounter (Signed)
-----   Message from Richardo Priest, MD sent at 09/23/2019 12:09 PM EDT ----- Normal or stable result  For cardiac cath

## 2019-09-24 ENCOUNTER — Other Ambulatory Visit (HOSPITAL_COMMUNITY)
Admission: RE | Admit: 2019-09-24 | Discharge: 2019-09-24 | Disposition: A | Discharge status: Home / Self Care | Payer: PPO | Source: Ambulatory Visit | Attending: Cardiology | Admitting: Cardiology

## 2019-09-24 DIAGNOSIS — Z20822 Contact with and (suspected) exposure to covid-19: Secondary | ICD-10-CM | POA: Diagnosis not present

## 2019-09-24 DIAGNOSIS — Z01812 Encounter for preprocedural laboratory examination: Secondary | ICD-10-CM | POA: Diagnosis not present

## 2019-09-24 LAB — SARS CORONAVIRUS 2 (TAT 6-24 HRS): SARS Coronavirus 2: NEGATIVE

## 2019-09-26 ENCOUNTER — Encounter: Payer: Self-pay | Admitting: *Deleted

## 2019-09-26 ENCOUNTER — Ambulatory Visit (HOSPITAL_COMMUNITY)
Admission: RE | Admit: 2019-09-26 | Discharge: 2019-09-26 | Disposition: A | Discharge status: Home / Self Care | Payer: PPO | Attending: Cardiology | Admitting: Cardiology

## 2019-09-26 ENCOUNTER — Encounter (HOSPITAL_COMMUNITY)
Admission: RE | Disposition: A | Discharge status: Home / Self Care | Payer: Self-pay | Source: Home / Self Care | Attending: Cardiology

## 2019-09-26 DIAGNOSIS — R9439 Abnormal result of other cardiovascular function study: Secondary | ICD-10-CM

## 2019-09-26 DIAGNOSIS — N183 Chronic kidney disease, stage 3 unspecified: Secondary | ICD-10-CM | POA: Insufficient documentation

## 2019-09-26 DIAGNOSIS — Z79890 Hormone replacement therapy (postmenopausal): Secondary | ICD-10-CM | POA: Diagnosis not present

## 2019-09-26 DIAGNOSIS — E079 Disorder of thyroid, unspecified: Secondary | ICD-10-CM | POA: Insufficient documentation

## 2019-09-26 DIAGNOSIS — Z885 Allergy status to narcotic agent status: Secondary | ICD-10-CM | POA: Insufficient documentation

## 2019-09-26 DIAGNOSIS — Z006 Encounter for examination for normal comparison and control in clinical research program: Secondary | ICD-10-CM

## 2019-09-26 DIAGNOSIS — G4730 Sleep apnea, unspecified: Secondary | ICD-10-CM | POA: Insufficient documentation

## 2019-09-26 DIAGNOSIS — Z79899 Other long term (current) drug therapy: Secondary | ICD-10-CM | POA: Diagnosis not present

## 2019-09-26 DIAGNOSIS — K219 Gastro-esophageal reflux disease without esophagitis: Secondary | ICD-10-CM | POA: Diagnosis not present

## 2019-09-26 DIAGNOSIS — I25119 Atherosclerotic heart disease of native coronary artery with unspecified angina pectoris: Secondary | ICD-10-CM | POA: Diagnosis not present

## 2019-09-26 DIAGNOSIS — E039 Hypothyroidism, unspecified: Secondary | ICD-10-CM | POA: Diagnosis not present

## 2019-09-26 DIAGNOSIS — Z7984 Long term (current) use of oral hypoglycemic drugs: Secondary | ICD-10-CM | POA: Diagnosis not present

## 2019-09-26 DIAGNOSIS — Z7951 Long term (current) use of inhaled steroids: Secondary | ICD-10-CM | POA: Insufficient documentation

## 2019-09-26 DIAGNOSIS — E1122 Type 2 diabetes mellitus with diabetic chronic kidney disease: Secondary | ICD-10-CM | POA: Diagnosis not present

## 2019-09-26 DIAGNOSIS — E782 Mixed hyperlipidemia: Secondary | ICD-10-CM | POA: Insufficient documentation

## 2019-09-26 DIAGNOSIS — E559 Vitamin D deficiency, unspecified: Secondary | ICD-10-CM | POA: Diagnosis not present

## 2019-09-26 DIAGNOSIS — E1136 Type 2 diabetes mellitus with diabetic cataract: Secondary | ICD-10-CM | POA: Insufficient documentation

## 2019-09-26 DIAGNOSIS — I209 Angina pectoris, unspecified: Secondary | ICD-10-CM | POA: Clinically undetermined

## 2019-09-26 DIAGNOSIS — I129 Hypertensive chronic kidney disease with stage 1 through stage 4 chronic kidney disease, or unspecified chronic kidney disease: Secondary | ICD-10-CM | POA: Insufficient documentation

## 2019-09-26 DIAGNOSIS — Z7982 Long term (current) use of aspirin: Secondary | ICD-10-CM | POA: Insufficient documentation

## 2019-09-26 DIAGNOSIS — M797 Fibromyalgia: Secondary | ICD-10-CM | POA: Diagnosis not present

## 2019-09-26 HISTORY — DX: Angina pectoris, unspecified: I20.9

## 2019-09-26 HISTORY — DX: Abnormal result of other cardiovascular function study: R94.39

## 2019-09-26 HISTORY — PX: LEFT HEART CATH AND CORONARY ANGIOGRAPHY: CATH118249

## 2019-09-26 LAB — GLUCOSE, CAPILLARY: Glucose-Capillary: 133 mg/dL — ABNORMAL HIGH (ref 70–99)

## 2019-09-26 SURGERY — LEFT HEART CATH AND CORONARY ANGIOGRAPHY
Anesthesia: LOCAL

## 2019-09-26 MED ORDER — FENTANYL CITRATE (PF) 100 MCG/2ML IJ SOLN
INTRAMUSCULAR | Status: DC | PRN
  Administered 2019-09-26: 25 ug via INTRAVENOUS

## 2019-09-26 MED ORDER — SODIUM CHLORIDE 0.9% FLUSH: 3.0000 mL | Freq: Two times a day (BID) | INTRAVENOUS | Status: DC

## 2019-09-26 MED ORDER — ONDANSETRON HCL 4 MG/2ML IJ SOLN: 4.0000 mg | Freq: Four times a day (QID) | INTRAMUSCULAR | Status: DC | PRN

## 2019-09-26 MED ORDER — HEPARIN (PORCINE) IN NACL 1000-0.9 UT/500ML-% IV SOLN
INTRAVENOUS | Status: DC | PRN
  Administered 2019-09-26 (×2): 500 mL

## 2019-09-26 MED ORDER — HYDRALAZINE HCL 20 MG/ML IJ SOLN: 10.0000 mg | INTRAMUSCULAR | Status: DC | PRN

## 2019-09-26 MED ORDER — SODIUM CHLORIDE 0.9% FLUSH: 3.0000 mL | INTRAVENOUS | Status: DC | PRN

## 2019-09-26 MED ORDER — MIDAZOLAM HCL 2 MG/2ML IJ SOLN
INTRAMUSCULAR | Status: DC | PRN
  Administered 2019-09-26: 1 mg via INTRAVENOUS

## 2019-09-26 MED ORDER — LIDOCAINE HCL (PF) 1 % IJ SOLN
INTRAMUSCULAR | Status: AC
  Filled 2019-09-26: qty 30

## 2019-09-26 MED ORDER — IOHEXOL 350 MG/ML SOLN
INTRAVENOUS | Status: DC | PRN
  Administered 2019-09-26: 50 mL

## 2019-09-26 MED ORDER — FENTANYL CITRATE (PF) 100 MCG/2ML IJ SOLN
INTRAMUSCULAR | Status: AC
  Filled 2019-09-26: qty 2

## 2019-09-26 MED ORDER — HEPARIN SODIUM (PORCINE) 1000 UNIT/ML IJ SOLN
INTRAMUSCULAR | Status: AC
  Filled 2019-09-26: qty 1

## 2019-09-26 MED ORDER — VERAPAMIL HCL 2.5 MG/ML IV SOLN
INTRAVENOUS | Status: AC
  Filled 2019-09-26: qty 2

## 2019-09-26 MED ORDER — SODIUM CHLORIDE 0.9 % IV SOLN: 250.0000 mL | INTRAVENOUS | Status: DC | PRN

## 2019-09-26 MED ORDER — SODIUM CHLORIDE 0.9 % WEIGHT BASED INFUSION
3.0000 mL/kg/h | INTRAVENOUS | Status: AC
  Administered 2019-09-26: 11:00:00 3 mL/kg/h via INTRAVENOUS

## 2019-09-26 MED ORDER — LIDOCAINE HCL (PF) 1 % IJ SOLN
INTRAMUSCULAR | Status: DC | PRN
  Administered 2019-09-26: 2 mL

## 2019-09-26 MED ORDER — SODIUM CHLORIDE 0.9 % IV SOLN: INTRAVENOUS | Status: DC

## 2019-09-26 MED ORDER — VERAPAMIL HCL 2.5 MG/ML IV SOLN
INTRAVENOUS | Status: DC | PRN
  Administered 2019-09-26: 10 mL via INTRA_ARTERIAL

## 2019-09-26 MED ORDER — HEPARIN SODIUM (PORCINE) 1000 UNIT/ML IJ SOLN
INTRAMUSCULAR | Status: DC | PRN
  Administered 2019-09-26: 6000 [IU] via INTRAVENOUS

## 2019-09-26 MED ORDER — HEPARIN (PORCINE) IN NACL 1000-0.9 UT/500ML-% IV SOLN
INTRAVENOUS | Status: AC
  Filled 2019-09-26: qty 1000

## 2019-09-26 MED ORDER — LABETALOL HCL 5 MG/ML IV SOLN: 10.0000 mg | INTRAVENOUS | Status: DC | PRN

## 2019-09-26 MED ORDER — SODIUM CHLORIDE 0.9 % WEIGHT BASED INFUSION: 1.0000 mL/kg/h | INTRAVENOUS | Status: DC

## 2019-09-26 MED ORDER — MIDAZOLAM HCL 2 MG/2ML IJ SOLN
INTRAMUSCULAR | Status: AC
  Filled 2019-09-26: qty 2

## 2019-09-26 MED ORDER — ASPIRIN 81 MG PO CHEW: 81.0000 mg | CHEWABLE_TABLET | ORAL | Status: DC

## 2019-09-26 SURGICAL SUPPLY — 10 items
CATH 5FR JL3.5 JR4 ANG PIG MP (CATHETERS) ×1 IMPLANT
DEVICE RAD COMP TR BAND LRG (VASCULAR PRODUCTS) ×1 IMPLANT
GLIDESHEATH SLEND SS 6F .021 (SHEATH) ×1 IMPLANT
GUIDEWIRE INQWIRE 1.5J.035X260 (WIRE) IMPLANT
INQWIRE 1.5J .035X260CM (WIRE) ×2
KIT HEART LEFT (KITS) ×2 IMPLANT
PACK CARDIAC CATHETERIZATION (CUSTOM PROCEDURE TRAY) ×2 IMPLANT
SHEATH PROBE COVER 6X72 (BAG) ×1 IMPLANT
TRANSDUCER W/STOPCOCK (MISCELLANEOUS) ×2 IMPLANT
TUBING CIL FLEX 10 FLL-RA (TUBING) ×2 IMPLANT

## 2019-09-26 NOTE — Progress Notes (Signed)
Patient and her husband was given discharge instructions. Both verbalized understanding.

## 2019-09-26 NOTE — Discharge Instructions (Signed)
Drink plenty of fluid for 48 hours and keep wrist elevated at heart level for 24 hours  Radial Site Care   This sheet gives you information about how to care for yourself after your procedure. Your health care provider may also give you more specific instructions. If you have problems or questions, contact your health care provider. What can I expect after the procedure? After the procedure, it is common to have:  Bruising and tenderness at the catheter insertion area. Follow these instructions at home: Medicines  Take over-the-counter and prescription medicines only as told by your health care provider. Insertion site care 1. Follow instructions from your health care provider about how to take care of your insertion site. Make sure you: ? Wash your hands with soap and water before you change your bandage (dressing). If soap and water are not available, use hand sanitizer. ? remove your dressing as told by your health care provider. In 24 hours 2. Check your insertion site every day for signs of infection. Check for: ? Redness, swelling, or pain. ? Fluid or blood. ? Pus or a bad smell. ? Warmth. 3. Do not take baths, swim, or use a hot tub until your health care provider approves. 4. You may shower 24-48 hours after the procedure, or as directed by your health care provider. ? Remove the dressing and gently wash the site with plain soap and water. ? Pat the area dry with a clean towel. ? Do not rub the site. That could cause bleeding. 5. Do not apply powder or lotion to the site. Activity   1. For 24 hours after the procedure, or as directed by your health care provider: ? Do not flex or bend the affected arm. ? Do not push or pull heavy objects with the affected arm. ? Do not drive yourself home from the hospital or clinic. You may drive 24 hours after the procedure unless your health care provider tells you not to. ? Do not operate machinery or power tools. 2. Do not lift  anything that is heavier than 10 lb (4.5 kg), or the limit that you are told, until your health care provider says that it is safe. For 4 days 3. Ask your health care provider when it is okay to: ? Return to work or school. ? Resume usual physical activities or sports. ? Resume sexual activity. General instructions  If the catheter site starts to bleed, raise your arm and put firm pressure on the site. If the bleeding does not stop, get help right away. This is a medical emergency.  If you went home on the same day as your procedure, a responsible adult should be with you for the first 24 hours after you arrive home.  Keep all follow-up visits as told by your health care provider. This is important. Contact a health care provider if:  You have a fever.  You have redness, swelling, or yellow drainage around your insertion site. Get help right away if:  You have unusual pain at the radial site.  The catheter insertion area swells very fast.  The insertion area is bleeding, and the bleeding does not stop when you hold steady pressure on the area.  Your arm or hand becomes pale, cool, tingly, or numb. These symptoms may represent a serious problem that is an emergency. Do not wait to see if the symptoms will go away. Get medical help right away. Call your local emergency services (911 in the [place] Do not   drive yourself to the hospital. Summary  After the procedure, it is common to have bruising and tenderness at the site.  Follow instructions from your health care provider about how to take care of your radial site wound. Check the wound every day for signs of infection.  Do not lift anything that is heavier than 10 lb (4.5 kg), or the limit that you are told, until your health care provider says that it is safe. This information is not intended to replace advice given to you by your health care provider. Make sure you discuss any questions you have with your health care  provider. Document Revised: 06/27/2017 Document Reviewed: 06/27/2017 Elsevier Patient Education  2020 Elsevier Inc.  

## 2019-09-26 NOTE — Interval H&P Note (Signed)
History and Physical Interval Note:  09/26/2019 3:53 PM  Joanna Alvarado  has presented today for surgery, with the diagnosis of abnormal mioview - chest pain.  The various methods of treatment have been discussed with the patient and family. After consideration of risks, benefits and other options for treatment, the patient has consented to  Procedure(s): LEFT HEART CATH AND CORONARY ANGIOGRAPHY (N/A)  PERCUTANEOUS CORONARY INTERVENTION  as a surgical intervention.  The patient's history has been reviewed, patient examined, no change in status, stable for surgery.  I have reviewed the patient's chart and labs.  Questions were answered to the patient's satisfaction.    Cath Lab Visit (complete for each Cath Lab visit)  Clinical Evaluation Leading to the Procedure:   ACS: No.  Non-ACS:    Anginal Classification: CCS III  Anti-ischemic medical therapy: Minimal Therapy (1 class of medications)  Non-Invasive Test Results: Low-risk stress test findings: cardiac mortality <1%/year; equivocal results.  Abnormal Myoview with continued symptoms.  Prior CABG: No previous CABG        Glenetta Hew

## 2019-09-29 NOTE — Progress Notes (Signed)
Cardiology Office Note:    Date:  09/30/2019   ID:  Joanna Alvarado, DOB 07-Sep-1955, MRN [id number]  PCP:  Ronita Hipps, MD  Cardiologist:  Shirlee More, MD    Referring MD: Ronita Hipps, MD    ASSESSMENT:    1. Mild CAD   2. Type 2 diabetes mellitus without complication, without long-term current use of insulin ([place])   3. Essential hypertension   4. Mixed hyperlipidemia    PLAN:    In order of problems listed above:  1. Stable medical therapy including antihypertensives lipid-lowering with high intensity statin and aspirin she has nitroglycerin to use as needed infrequent episodes we could add oral mononitrates or ranolazine.  She seems to be reassured and will follow up in 6 months recheck renal function with CKD after angiography 2. Stable CAD diabetes managed by her PCP 3. BP at target continue current treatment including ARB with diabetes 4. Recent start on high intensity statin check liver function lipid profile goal LDL less than 100   Next appointment: 6 months   Medication Adjustments/Labs and Tests Ordered: Current medicines are reviewed at length with the patient today.  Concerns regarding medicines are outlined above.  No orders of the defined types were placed in this encounter.  No orders of the defined types were placed in this encounter.   Chief Complaint  Patient presents with  . Follow-up    Cath    History of Present Illness:    Joanna Alvarado is a 64 y.o. female with a hx of hypertension hyperlipidemia type 2 diabetes mellitus seen 08/27/2019 with chest pain after [facility] ED visit 07/04/2019.Marland Kitchen  She had a myocardial perfusion study performed 09/17/2019 basal and mid inferior moderate severity perfusion defect with hypokinesia representing either severe ischemia or prior infarction with evidence of peri-infarct ischemia  She was last seen 09/22/2019 advise coronary angiography.. Compliance with diet, lifestyle and medications: Yes  I  reviewed the results of her coronary angiogram patient and her husband.  She was a little confused she thought that it was normal and she needed additional testing.  I reviewed with her that her presentation is very characteristic in women that ischemia with nonobstructive coronary arteries.  Her prognosis is good she needs for further testing and should do well with aspirin beta-blocker and cholesterol lowering with a high intensity statin.  She has not had recurrent angina has not needed nitroglycerin and she had frequent episodes of placed on oral nitrates.  She is reassured.  We will recheck her look kidney function with CKD coronary angiography liver function lipid profile now that she takes high intensity statin.  I will plan to see in 6 months or sooner her husband was present participated in discussion and decision making She underwent left heart catheterization 09/26/2018 showing mild nonobstructive CAD:  Coronary Diagrams  Diagnostic Dominance: Right   Past Medical History:  Diagnosis Date  . Abnormal myocardial perfusion study 09/26/2019  . Anemia   . Angina pectoris ([place]) - Class II-III 09/26/2019  . Arthritis    KNEES  . Benign neoplasm of ascending colon   . Benign neoplasm of cecum   . Benign neoplasm of descending colon   . Benign neoplasm of sigmoid colon   . Benign neoplasm of transverse colon   . Bowel habit changes   . Cataract    BILATERAL-REMOVED  . Chronic postoperative pain 12/02/2012  . Diabetes mellitus without complication ([place])   . Disturbance of skin  sensation 12/02/2012  . Fibromyalgia   . GERD (gastroesophageal reflux disease)   . History of colonic polyps   . Hypertension   . Hypothyroidism   . Myalgia and myositis, unspecified 12/02/2012  . Personal history of colonic polyps   . Primary hypothyroidism 11/30/2015  . Senile nuclear sclerosis 04/24/2014  . Sleep apnea    uses c-pap  . Thyroid disease   . Thyroid nodule 11/30/2015  . Type 2 diabetes  mellitus without complications ([place]) 123456  . Vitamin D deficiency 11/30/2015    Past Surgical History:  Procedure Laterality Date  . ABDOMINAL HYSTERECTOMY    . APPENDECTOMY    . CHOLECYSTECTOMY    . COLONOSCOPY N/A 07/27/2015   Procedure: COLONOSCOPY;  Surgeon: Irene Shipper, MD;  Location: [facility];  Service: Endoscopy;  Laterality: N/A;  . COLONOSCOPY WITH PROPOFOL N/A 04/28/2019   Procedure: COLONOSCOPY WITH PROPOFOL;  Surgeon: Irene Shipper, MD;  Location: [facility]COPY;  Service: Endoscopy;  Laterality: N/A;  . EYE SURGERY     bilateral cataracts with lens implants  . floater bone surgery     bone removed from right hand  . FOOT SURGERY     RIGHT  . left foot surgery     removed tendon and placed pins in foot  . LEFT HEART CATH AND CORONARY ANGIOGRAPHY N/A 09/26/2019   Procedure: LEFT HEART CATH AND CORONARY ANGIOGRAPHY;  Surgeon: Leonie Man, MD;  Location: [facility];  Service: Cardiovascular;  Laterality: N/A;  . OTHER SURGICAL HISTORY     Triple orthosesis with tendon release  . PLANTAR FASCIA SURGERY     both feet  . POLYPECTOMY  04/28/2019   Procedure: POLYPECTOMY;  Surgeon: Irene Shipper, MD;  Location: [facility];  Service: Endoscopy;;  . TONSILLECTOMY      Current Medications: Current Meds  Medication Sig  . acetaminophen (TYLENOL) 500 MG tablet Take 1,000 mg by mouth every 6 (six) hours as needed for moderate pain.  Marland Kitchen aspirin EC 81 MG tablet Take 81 mg by mouth daily.  . budesonide-formoterol (SYMBICORT) 80-4.5 MCG/ACT inhaler Inhale 2 puffs into the lungs 2 (two) times daily as needed (shortness of breath).   . diclofenac (CATAFLAM) 50 MG tablet Take 50 mg by mouth daily.   . ergocalciferol (VITAMIN D2) 1.25 MG (50000 UT) capsule Take 50,000 Units by mouth every Saturday.   . hydrochlorothiazide (MICROZIDE) 12.5 MG capsule Take 12.5 mg by mouth daily.  Marland Kitchen levothyroxine (SYNTHROID, LEVOTHROID) 175 MCG tablet Take 175-262.5 mcg by mouth See  admin instructions. Take 262.5 mcg  on Monday and Friday Tale 175 mcg on Tuesday, Wednesday, Thursday, Saturday, and Sunday  . linaclotide (LINZESS) 290 MCG CAPS capsule Take 290 mcg by mouth daily as needed (constipation).   Marland Kitchen losartan (COZAAR) 50 MG tablet Take 50 mg by mouth daily.  . metFORMIN (GLUCOPHAGE-XR) 500 MG 24 hr tablet Take 1,000 mg by mouth 2 (two) times daily.  . metoprolol succinate (TOPROL XL) 25 MG 24 hr tablet Take 1 tablet (25 mg total) by mouth 2 (two) times daily.  . nitroGLYCERIN (NITROSTAT) 0.4 MG SL tablet Place 1 tablet (0.4 mg total) under the tongue every 5 (five) minutes as needed for chest pain.  Marland Kitchen nystatin cream (MYCOSTATIN) Apply 1 application topically See admin instructions. Apply once daily may apply a second time as needed for rash  . omeprazole (PRILOSEC) 40 MG capsule Take 40 mg by mouth daily.  . polyethylene glycol (MIRALAX / GLYCOLAX)  17 g packet Take 51 g by mouth daily as needed for severe constipation.  . rosuvastatin (CRESTOR) 5 MG tablet Take 1 tablet (5 mg total) by mouth daily.  . Semaglutide (OZEMPIC, 0.25 OR 0.5 MG/DOSE, [place]) Inject 0.25 mg into the skin every Sunday.      Allergies:   Adhesive [tape], Hydrocodone-acetaminophen, Oxycontin [oxycodone hcl], Trulicity [dulaglutide], Ciprofloxacin, Codeine, and Other   Social History   Socioeconomic History  . Marital status: Married    Spouse name: Not on file  . Number of children: Not on file  . Years of education: Not on file  . Highest education level: Not on file  Occupational History  . Not on file  Tobacco Use  . Smoking status: Never Smoker  . Smokeless tobacco: Never Used  Substance and Sexual Activity  . Alcohol use: No  . Drug use: No  . Sexual activity: Not on file  Other Topics Concern  . Not on file  Social History Narrative  . Not on file   Social Determinants of Health   Financial Resource Strain:   . Difficulty of Paying Living Expenses:   Food Insecurity:   .  Worried About Charity fundraiser in the Last Year:   . Arboriculturist in the Last Year:   Transportation Needs:   . Film/video editor (Medical):   Marland Kitchen Lack of Transportation (Non-Medical):   Physical Activity:   . Days of Exercise per Week:   . Minutes of Exercise per Session:   Stress:   . Feeling of Stress :   Social Connections:   . Frequency of Communication with Friends and Family:   . Frequency of Social Gatherings with Friends and Family:   . Attends Religious Services:   . Active Member of Clubs or Organizations:   . Attends Archivist Meetings:   Marland Kitchen Marital Status:      Family History: The patient's family history includes Colon polyps in her brother and brother; Diabetes in her mother; Heart disease in her mother; Hypertension in her father and mother. There is no history of Colon cancer, Esophageal cancer, Rectal cancer, or Stomach cancer. ROS:   Please see the history of present illness.    All other systems reviewed and are negative.  EKGs/Labs/Other Studies Reviewed:    The following studies were reviewed today:    Recent Labs: 09/22/2019: BUN 14; Creatinine, Ser 1.03; Hemoglobin 14.5; Platelets 134; Potassium 3.8; Sodium 141  Recent Lipid Panel No results found for: CHOL, TRIG, HDL, CHOLHDL, VLDL, LDLCALC, LDLDIRECT  Physical Exam:    VS:  BP 130/60 (BP Location: Left Arm, Patient Position: Sitting, Cuff Size: Large)   Pulse 74   Ht 5\' 7"  (1.702 m)   Wt (!) 342 lb (155.1 kg)   SpO2 95%   BMI 53.56 kg/m     Wt Readings from Last 3 Encounters:  09/30/19 (!) 342 lb (155.1 kg)  09/26/19 (!) 333 lb (151 kg)  09/22/19 (!) 344 lb 3.2 oz (156.1 kg)     GEN:  Well nourished, well developed in no acute distress HEENT: Normal NECK: No JVD; No carotid bruits LYMPHATICS: No lymphadenopathy CARDIAC: RRR, no murmurs, rubs, gallops RESPIRATORY:  Clear to auscultation without rales, wheezing or rhonchi  ABDOMEN: Soft, non-tender,  non-distended MUSCULOSKELETAL:  No edema; No deformity  SKIN: Warm and dry NEUROLOGIC:  Alert and oriented x 3 PSYCHIATRIC:  Normal affect    Signed, Shirlee More, MD  09/30/2019 10:33 AM  [facility]

## 2019-09-29 NOTE — Research (Signed)
CADFEM Informed Consent                  Subject Name:   Joanna Alvarado   Subject met inclusion and exclusion criteria.  The informed consent form, study requirements and expectations were reviewed with the subject and questions and concerns were addressed prior to the signing of the consent form.  The subject verbalized understanding of the trial requirements.  The subject agreed to participate in the CADFEM trial and signed the informed consent.  The informed consent was obtained prior to performance of any protocol-specific procedures for the subject.  A copy of the signed informed consent was given to the subject and a copy was placed in the subject's medical record. This patient was consented by Kay McChesney    [place] Jantz Main, Research Assistant  09/26/2019  11:20 a.m.

## 2019-09-30 ENCOUNTER — Other Ambulatory Visit: Payer: Self-pay

## 2019-09-30 ENCOUNTER — Ambulatory Visit: Payer: PPO | Admitting: Cardiology

## 2019-09-30 VITALS — BP 130/60 | HR 74 | Ht 67.0 in | Wt 342.0 lb

## 2019-09-30 DIAGNOSIS — I10 Essential (primary) hypertension: Secondary | ICD-10-CM | POA: Diagnosis not present

## 2019-09-30 DIAGNOSIS — E119 Type 2 diabetes mellitus without complications: Secondary | ICD-10-CM | POA: Diagnosis not present

## 2019-09-30 DIAGNOSIS — E782 Mixed hyperlipidemia: Secondary | ICD-10-CM

## 2019-09-30 DIAGNOSIS — I2510 Atherosclerotic heart disease of native coronary artery without angina pectoris: Secondary | ICD-10-CM

## 2019-09-30 LAB — COMPREHENSIVE METABOLIC PANEL
ALT: 28 IU/L (ref 0–32)
AST: 42 IU/L — ABNORMAL HIGH (ref 0–40)
Albumin/Globulin Ratio: 1.4 (ref 1.2–2.2)
Albumin: 3.8 g/dL (ref 3.8–4.8)
Alkaline Phosphatase: 84 IU/L (ref 39–117)
BUN/Creatinine Ratio: 12 (ref 12–28)
BUN: 14 mg/dL (ref 8–27)
Bilirubin Total: 0.7 mg/dL (ref 0.0–1.2)
CO2: 20 mmol/L (ref 20–29)
Calcium: 10.7 mg/dL — ABNORMAL HIGH (ref 8.7–10.3)
Chloride: 108 mmol/L — ABNORMAL HIGH (ref 96–106)
Creatinine, Ser: 1.18 mg/dL — ABNORMAL HIGH (ref 0.57–1.00)
GFR calc Af Amer: 56 mL/min/{1.73_m2} — ABNORMAL LOW (ref >59)
GFR calc non Af Amer: 49 mL/min/{1.73_m2} — ABNORMAL LOW (ref >59)
Globulin, Total: 2.8 g/dL (ref 1.5–4.5)
Glucose: 157 mg/dL — ABNORMAL HIGH (ref 65–99)
Potassium: 4.4 mmol/L (ref 3.5–5.2)
Sodium: 142 mmol/L (ref 134–144)
Total Protein: 6.6 g/dL (ref 6.0–8.5)

## 2019-09-30 LAB — LIPID PANEL
Chol/HDL Ratio: 3.2 ratio (ref 0.0–4.4)
Cholesterol, Total: 119 mg/dL (ref 100–199)
HDL: 37 mg/dL — ABNORMAL LOW (ref >39)
LDL Chol Calc (NIH): 63 mg/dL (ref 0–99)
Triglycerides: 103 mg/dL (ref 0–149)
VLDL Cholesterol Cal: 19 mg/dL (ref 5–40)

## 2019-09-30 NOTE — Patient Instructions (Signed)

## 2019-10-01 ENCOUNTER — Telehealth: Payer: Self-pay

## 2019-10-01 NOTE — Telephone Encounter (Signed)
-----   Message from Richardo Priest, MD sent at 10/01/2019  7:56 AM EDT ----- Normal or stable result  Lipids are ideal no changes

## 2019-10-01 NOTE — Telephone Encounter (Signed)
Spoke with patient regarding results and recommendation.  Patient verbalizes understanding and is agreeable to plan of care. Advised patient to call back with any issues or concerns.  

## 2019-10-03 DIAGNOSIS — F418 Other specified anxiety disorders: Secondary | ICD-10-CM | POA: Diagnosis not present

## 2019-10-03 DIAGNOSIS — I10 Essential (primary) hypertension: Secondary | ICD-10-CM | POA: Diagnosis not present

## 2019-10-09 ENCOUNTER — Ambulatory Visit: Payer: PPO | Admitting: Cardiology

## 2019-10-31 DIAGNOSIS — Z6841 Body Mass Index (BMI) 40.0 and over, adult: Secondary | ICD-10-CM | POA: Diagnosis not present

## 2019-10-31 DIAGNOSIS — K529 Noninfective gastroenteritis and colitis, unspecified: Secondary | ICD-10-CM | POA: Diagnosis not present

## 2019-11-14 DIAGNOSIS — E039 Hypothyroidism, unspecified: Secondary | ICD-10-CM | POA: Diagnosis not present

## 2019-11-14 DIAGNOSIS — E041 Nontoxic single thyroid nodule: Secondary | ICD-10-CM | POA: Diagnosis not present

## 2019-11-14 DIAGNOSIS — E119 Type 2 diabetes mellitus without complications: Secondary | ICD-10-CM | POA: Diagnosis not present

## 2019-11-14 DIAGNOSIS — E559 Vitamin D deficiency, unspecified: Secondary | ICD-10-CM | POA: Diagnosis not present

## 2019-11-17 NOTE — Progress Notes (Signed)
Cardiology Office Note:    Date:  11/18/2019   ID:  Joanna Alvarado, DOB March 01, 1956, MRN [id number]  PCP:  Ronita Hipps, MD  Cardiologist:  Shirlee More, MD    Referring MD: Ronita Hipps, MD    ASSESSMENT:    1. Mild CAD   2. Essential hypertension   3. Type 2 diabetes mellitus without complication, without long-term current use of insulin ([place])   4. Mixed hyperlipidemia    PLAN:    In order of problems listed above:  1. Stable she has mild CAD and has stress-induced chest discomfort rarely needing nitroglycerin.  We will continue her current medical regimen if symptoms are more pronounced typical we could add additional antianginal medications either oral nitrates or ranolazine.  I will plan to see her in 6 months unless she has change in her pattern  Continue medical therapy aspirin beta-blocker statin 2. BP at target continue treatment including ARB 3. Lipids are ideal continue high intensity statin 4. Stable diabetes managed by her PCP   Next appointment: 6 months   Medication Adjustments/Labs and Tests Ordered: Current medicines are reviewed at length with the patient today.  Concerns regarding medicines are outlined above.  No orders of the defined types were placed in this encounter.  No orders of the defined types were placed in this encounter.   Chief Complaint  Patient presents with  . Follow-up  . Coronary Artery Disease    History of Present Illness:    Joanna Alvarado is a 64 y.o. female with a hx of hypertension hyperlipidemia type 2 diabetes mellitus seen 08/27/2019 with chest pain after [facility] ED visit 07/04/2019.Marland Kitchen  She had a myocardial perfusion study performed 09/17/2019 basal and mid inferior moderate severity perfusion defect with hypokinesia representing either severe ischemia or prior infarction with evidence of peri-infarct ischemia  She was seen 09/22/2019 advise coronary angiography.She underwent left heart catheterization  09/26/2018 showing mild nonobstructive CAD.  Coronary Diagrams  Diagnostic Dominance: Right   She was last seen 09/30/2019. Compliance with diet, lifestyle and medications: Yes  I had intended to see her at 20-month interval I think she is here for 6-week visit but is fortunate.  She still does not have a grasp that she has mild CAD I reinforced the findings and I think it helped.  With emotional upset she gets chest pain but only on one occasion as needed nitroglycerin.  She has no exertional chest pain shortness of breath palpitation or syncope.  We reviewed the option of additional antianginal medications but neither of [place] feel she needs it at this time.  I asked her to contact me if she is having more frequent angina edema nitroglycerin or exertional symptoms we will continue her medical treatment including aspirin high intensity statin beta-blocker and as needed nitroglycerin.  She is having no muscle pain or weakness from her statin and her lipids are at target LDL 63 Past Medical History:  Diagnosis Date  . Abnormal myocardial perfusion study 09/26/2019  . Anemia   . Angina pectoris ([place]) - Class II-III 09/26/2019  . Arthritis    KNEES  . Benign neoplasm of ascending colon   . Benign neoplasm of cecum   . Benign neoplasm of descending colon   . Benign neoplasm of sigmoid colon   . Benign neoplasm of transverse colon   . Bowel habit changes   . Cataract    BILATERAL-REMOVED  . Chronic postoperative pain 12/02/2012  . Diabetes mellitus  without complication ([place])   . Disturbance of skin sensation 12/02/2012  . Fibromyalgia   . GERD (gastroesophageal reflux disease)   . History of colonic polyps   . Hypertension   . Hypothyroidism   . Myalgia and myositis, unspecified 12/02/2012  . Personal history of colonic polyps   . Primary hypothyroidism 11/30/2015  . Senile nuclear sclerosis 04/24/2014  . Sleep apnea    uses c-pap  . Thyroid disease   . Thyroid nodule 11/30/2015  . Type 2  diabetes mellitus without complications ([place]) 46/96/2952  . Vitamin D deficiency 11/30/2015    Past Surgical History:  Procedure Laterality Date  . ABDOMINAL HYSTERECTOMY    . APPENDECTOMY    . CHOLECYSTECTOMY    . COLONOSCOPY N/A 07/27/2015   Procedure: COLONOSCOPY;  Surgeon: Irene Shipper, MD;  Location: [facility];  Service: Endoscopy;  Laterality: N/A;  . COLONOSCOPY WITH PROPOFOL N/A 04/28/2019   Procedure: COLONOSCOPY WITH PROPOFOL;  Surgeon: Irene Shipper, MD;  Location: [facility];  Service: Endoscopy;  Laterality: N/A;  . EYE SURGERY     bilateral cataracts with lens implants  . floater bone surgery     bone removed from right hand  . FOOT SURGERY     RIGHT  . left foot surgery     removed tendon and placed pins in foot  . LEFT HEART CATH AND CORONARY ANGIOGRAPHY N/A 09/26/2019   Procedure: LEFT HEART CATH AND CORONARY ANGIOGRAPHY;  Surgeon: Leonie Man, MD;  Location: [facility];  Service: Cardiovascular;  Laterality: N/A;  . OTHER SURGICAL HISTORY     Triple orthosesis with tendon release  . PLANTAR FASCIA SURGERY     both feet  . POLYPECTOMY  04/28/2019   Procedure: POLYPECTOMY;  Surgeon: Irene Shipper, MD;  Location: [facility];  Service: Endoscopy;;  . TONSILLECTOMY      Current Medications: Current Meds  Medication Sig  . acetaminophen (TYLENOL) 500 MG tablet Take 1,000 mg by mouth every 6 (six) hours as needed for moderate pain.  Marland Kitchen aspirin EC 81 MG tablet Take 81 mg by mouth daily.  . budesonide-formoterol (SYMBICORT) 80-4.5 MCG/ACT inhaler Inhale 2 puffs into the lungs 2 (two) times daily as needed (shortness of breath).   . diclofenac (CATAFLAM) 50 MG tablet Take 50 mg by mouth daily.   . ergocalciferol (VITAMIN D2) 1.25 MG (50000 UT) capsule Take 50,000 Units by mouth every Saturday.   . hydrochlorothiazide (MICROZIDE) 12.5 MG capsule Take 12.5 mg by mouth daily.  Marland Kitchen levothyroxine (SYNTHROID, LEVOTHROID) 175 MCG tablet Take 175-262.5 mcg by  mouth See admin instructions. Take 262.5 mcg  on Monday and Friday Tale 175 mcg on Tuesday, Wednesday, Thursday, Saturday, and Sunday  . linaclotide (LINZESS) 290 MCG CAPS capsule Take 290 mcg by mouth daily.   Marland Kitchen losartan (COZAAR) 50 MG tablet Take 50 mg by mouth daily.  . metFORMIN (GLUCOPHAGE-XR) 500 MG 24 hr tablet Take 1,000 mg by mouth 2 (two) times daily.  . metoprolol succinate (TOPROL XL) 25 MG 24 hr tablet Take 1 tablet (25 mg total) by mouth 2 (two) times daily.  . nitroGLYCERIN (NITROSTAT) 0.4 MG SL tablet Place 1 tablet (0.4 mg total) under the tongue every 5 (five) minutes as needed for chest pain.  Marland Kitchen nystatin cream (MYCOSTATIN) Apply 1 application topically See admin instructions. Apply once daily may apply a second time as needed for rash  . omeprazole (PRILOSEC) 40 MG capsule Take 40 mg by mouth daily.  Marland Kitchen  polyethylene glycol (MIRALAX / GLYCOLAX) 17 g packet Take 51 g by mouth daily as needed for severe constipation.  . rosuvastatin (CRESTOR) 5 MG tablet Take 1 tablet (5 mg total) by mouth daily.  . Semaglutide (OZEMPIC, 0.25 OR 0.5 MG/DOSE, ) Inject 0.25 mg into the skin every Sunday.      Allergies:   Adhesive [tape], Hydrocodone-acetaminophen, Oxycontin [oxycodone hcl], Trulicity [dulaglutide], Ciprofloxacin, Codeine, and Other   Social History   Socioeconomic History  . Marital status: Married    Spouse name: Not on file  . Number of children: Not on file  . Years of education: Not on file  . Highest education level: Not on file  Occupational History  . Not on file  Tobacco Use  . Smoking status: Never Smoker  . Smokeless tobacco: Never Used  Vaping Use  . Vaping Use: Never used  Substance and Sexual Activity  . Alcohol use: No  . Drug use: No  . Sexual activity: Not on file  Other Topics Concern  . Not on file  Social History Narrative  . Not on file   Social Determinants of Health   Financial Resource Strain:   . Difficulty of Paying Living Expenses:    Food Insecurity:   . Worried About Charity fundraiser in the Last Year:   . Arboriculturist in the Last Year:   Transportation Needs:   . Film/video editor (Medical):   Marland Kitchen Lack of Transportation (Non-Medical):   Physical Activity:   . Days of Exercise per Week:   . Minutes of Exercise per Session:   Stress:   . Feeling of Stress :   Social Connections:   . Frequency of Communication with Friends and Family:   . Frequency of Social Gatherings with Friends and Family:   . Attends Religious Services:   . Active Member of Clubs or Organizations:   . Attends Archivist Meetings:   Marland Kitchen Marital Status:      Family History: The patient's family history includes Colon polyps in her brother and brother; Diabetes in her mother; Heart disease in her mother; Hypertension in her father and mother. There is no history of Colon cancer, Esophageal cancer, Rectal cancer, or Stomach cancer. ROS:   Please see the history of present illness.    All other systems reviewed and are negative.  EKGs/Labs/Other Studies Reviewed:    The following studies were reviewed today  Recent Labs: 09/22/2019: Hemoglobin 14.5; Platelets 134 09/30/2019: ALT 28; BUN 14; Creatinine, Ser 1.18; Potassium 4.4; Sodium 142  Recent Lipid Panel    Component Value Date/Time   CHOL 119 09/30/2019 1041   TRIG 103 09/30/2019 1041   HDL 37 (L) 09/30/2019 1041   CHOLHDL 3.2 09/30/2019 1041   LDLCALC 63 09/30/2019 1041    Physical Exam:    VS:  BP 114/60 (BP Location: Right Arm, Patient Position: Sitting, Cuff Size: Large)   Pulse 78   Ht 5\' 7"  (1.702 m)   Wt (!) 341 lb (154.7 kg)   SpO2 96%   BMI 53.41 kg/m     Wt Readings from Last 3 Encounters:  11/18/19 (!) 341 lb (154.7 kg)  09/30/19 (!) 342 lb (155.1 kg)  09/26/19 (!) 333 lb (151 kg)     GEN:  Well nourished, well developed in no acute distress HEENT: Normal NECK: No JVD; No carotid bruits LYMPHATICS: No lymphadenopathy CARDIAC: RRR, no  murmurs, rubs, gallops RESPIRATORY:  Clear to auscultation without rales,  wheezing or rhonchi  ABDOMEN: Soft, non-tender, non-distended MUSCULOSKELETAL:  No edema; No deformity  SKIN: Warm and dry NEUROLOGIC:  Alert and oriented x 3 PSYCHIATRIC:  Normal affect    Signed, Shirlee More, MD  11/18/2019 1:16 PM    [facility]

## 2019-11-18 ENCOUNTER — Ambulatory Visit: Payer: PPO | Admitting: Cardiology

## 2019-11-18 ENCOUNTER — Encounter: Payer: Self-pay | Admitting: Cardiology

## 2019-11-18 ENCOUNTER — Other Ambulatory Visit: Payer: Self-pay

## 2019-11-18 VITALS — BP 114/60 | HR 78 | Ht 67.0 in | Wt 341.0 lb

## 2019-11-18 DIAGNOSIS — E119 Type 2 diabetes mellitus without complications: Secondary | ICD-10-CM | POA: Diagnosis not present

## 2019-11-18 DIAGNOSIS — E782 Mixed hyperlipidemia: Secondary | ICD-10-CM

## 2019-11-18 DIAGNOSIS — I2510 Atherosclerotic heart disease of native coronary artery without angina pectoris: Secondary | ICD-10-CM | POA: Diagnosis not present

## 2019-11-18 DIAGNOSIS — I10 Essential (primary) hypertension: Secondary | ICD-10-CM | POA: Diagnosis not present

## 2019-11-18 NOTE — Patient Instructions (Signed)

## 2020-01-01 ENCOUNTER — Other Ambulatory Visit: Payer: Self-pay

## 2020-02-02 ENCOUNTER — Telehealth: Payer: Self-pay | Admitting: Cardiology

## 2020-02-02 ENCOUNTER — Other Ambulatory Visit: Payer: Self-pay | Admitting: Cardiology

## 2020-02-02 MED ORDER — ISOSORBIDE MONONITRATE ER 30 MG PO TB24
30.0000 mg | ORAL_TABLET | Freq: Every day | ORAL | 3 refills | Status: DC
Start: 2020-02-02 — End: 2020-03-05

## 2020-02-02 NOTE — Addendum Note (Signed)
Addended by: Truddie Hidden on: 02/02/2020 10:03 AM   Modules accepted: Orders

## 2020-02-02 NOTE — Telephone Encounter (Signed)
I reviewed her chart she has mild CAD seen recently lets start on oral Imdur 30 mg daily.

## 2020-02-02 NOTE — Telephone Encounter (Signed)
Pt aware of of Imdur prescription.

## 2020-02-02 NOTE — Telephone Encounter (Signed)
Pt c/o of Chest Pain: STAT if CP now or developed within 24 hours  1. Are you having CP right now? no  2. Are you experiencing any other symptoms (ex. SOB, nausea, vomiting, sweating)? no  3. How long have you been experiencing CP? Yesterday 2:00pm and last night 9:00pm  4. Is your CP continuous or coming and going? Coming and going  5. Have you taken Nitroglycerin? Yes, took 2 ?  Patient states yesterday she started having some chest pain and a hard heartbeat. She states the pain shot down her arm as well. She states she took nitroglycerin and has not had any symptoms this morning except for a headache.

## 2020-02-02 NOTE — Telephone Encounter (Signed)
Called and spoke with pt who states that the first sharp pain in her left arm with chest pain she had she took a NTG and it resolved. Pt states that she had another episode later in the evening which she done the same. Pt states that she dose not want to go to the ED unless absolutely necessary. She states today she has soreness in her left arm and a "flutter" in her chest. Pt states that she did have nausea last night but it has resolved as well. How do you advise?

## 2020-02-16 DIAGNOSIS — R252 Cramp and spasm: Secondary | ICD-10-CM | POA: Diagnosis not present

## 2020-03-03 DIAGNOSIS — J329 Chronic sinusitis, unspecified: Secondary | ICD-10-CM | POA: Diagnosis not present

## 2020-03-03 DIAGNOSIS — R0982 Postnasal drip: Secondary | ICD-10-CM | POA: Diagnosis not present

## 2020-03-03 DIAGNOSIS — J029 Acute pharyngitis, unspecified: Secondary | ICD-10-CM | POA: Diagnosis not present

## 2020-03-03 DIAGNOSIS — J309 Allergic rhinitis, unspecified: Secondary | ICD-10-CM | POA: Diagnosis not present

## 2020-03-05 ENCOUNTER — Encounter: Payer: Self-pay | Admitting: Cardiology

## 2020-03-05 ENCOUNTER — Other Ambulatory Visit: Payer: Self-pay

## 2020-03-05 ENCOUNTER — Ambulatory Visit: Payer: PPO | Admitting: Cardiology

## 2020-03-05 VITALS — BP 140/60 | HR 71 | Resp 15 | Ht 67.0 in | Wt 346.0 lb

## 2020-03-05 DIAGNOSIS — Z1231 Encounter for screening mammogram for malignant neoplasm of breast: Secondary | ICD-10-CM | POA: Diagnosis not present

## 2020-03-05 DIAGNOSIS — R0989 Other specified symptoms and signs involving the circulatory and respiratory systems: Secondary | ICD-10-CM

## 2020-03-05 DIAGNOSIS — I351 Nonrheumatic aortic (valve) insufficiency: Secondary | ICD-10-CM

## 2020-03-05 DIAGNOSIS — I209 Angina pectoris, unspecified: Secondary | ICD-10-CM

## 2020-03-05 DIAGNOSIS — E7800 Pure hypercholesterolemia, unspecified: Secondary | ICD-10-CM

## 2020-03-05 DIAGNOSIS — I10 Essential (primary) hypertension: Secondary | ICD-10-CM | POA: Diagnosis not present

## 2020-03-05 MED ORDER — ROSUVASTATIN CALCIUM 5 MG PO TABS: 5.0000 mg | ORAL_TABLET | Freq: Every day | ORAL | 3 refills | Status: DC

## 2020-03-05 MED ORDER — ISOSORBIDE MONONITRATE ER 120 MG PO TB24: 120.0000 mg | ORAL_TABLET | Freq: Every day | ORAL | 3 refills | Status: DC

## 2020-03-05 NOTE — Progress Notes (Signed)
Primary Physician/Referring:  Ronita Hipps, MD  Patient ID: Joanna Alvarado, female    DOB: 07-08-1955, 64 y.o.   MRN: [id number]  Chief Complaint  Patient presents with  . New Patient (Initial Visit)  . Chest Pain   HPI:    Joanna Alvarado  is a 64 y.o. female with a past medical history of hypertension, controlled diabetes mellitus, hyperlipidemia, sleep apnea using CPAP, GERD, hiatal hernia, fibromyalgia, and hypothyroidism. She presents today for new patient evaluation.  The patient states in January 2021 she had two episodes of chest pain in the center of her chest that felt like she was punched in the chest. Her PCP sent her to the ED for further evaluation after noting EKG changes. She was evaluated in the ED and pain was felt to be musculoskeletal, but she was referred to cardiology for follow up. With cardiology in April 2021 she underwent Zio patch monitoring and nuclear stress test. Her stress test was abnormal. She underwent left heart cath with Dr. Ellyn Hack showing mild CAD. She has been following with Dr. Bettina Gavia and is being treated for angina and risk factor modification, but wanted to be seen in our clinic for a second opinion. She continues to have chest pain every couple of weeks. The pain typically lasts a few minutes at a time. She has had one episode that lasted 20-30 minutes and pain radiated down her arm. These episodes occur every 2-3 weeks. The pain is usually improved by taking nitroglycerin. She has mild intermittent swelling in bilateral legs, typically after being on her feet. Denies shortness of breath, palpitations, orthopnea.  Past Medical History:  Diagnosis Date  . Abnormal myocardial perfusion study 09/26/2019  . Anemia   . Angina pectoris ([place]) - Class II-III 09/26/2019  . Arthritis    KNEES  . Benign neoplasm of ascending colon   . Benign neoplasm of cecum   . Benign neoplasm of descending colon   . Benign neoplasm of sigmoid colon   . Benign neoplasm  of transverse colon   . Bowel habit changes   . Cataract    BILATERAL-REMOVED  . Chronic postoperative pain 12/02/2012  . Diabetes mellitus without complication ([place])   . Disturbance of skin sensation 12/02/2012  . Fibromyalgia   . GERD (gastroesophageal reflux disease)   . History of colonic polyps   . Hypertension   . Hypothyroidism   . Myalgia and myositis, unspecified 12/02/2012  . Personal history of colonic polyps   . Primary hypothyroidism 11/30/2015  . Senile nuclear sclerosis 04/24/2014  . Sleep apnea    uses c-pap  . Thyroid disease   . Thyroid nodule 11/30/2015  . Type 2 diabetes mellitus without complications ([place]) 83/38/2505  . Vitamin D deficiency 11/30/2015   Past Surgical History:  Procedure Laterality Date  . ABDOMINAL HYSTERECTOMY    . APPENDECTOMY    . CHOLECYSTECTOMY    . COLONOSCOPY N/A 07/27/2015   Procedure: COLONOSCOPY;  Surgeon: Irene Shipper, MD;  Location: [facility];  Service: Endoscopy;  Laterality: N/A;  . COLONOSCOPY WITH PROPOFOL N/A 04/28/2019   Procedure: COLONOSCOPY WITH PROPOFOL;  Surgeon: Irene Shipper, MD;  Location: [facility];  Service: Endoscopy;  Laterality: N/A;  . EYE SURGERY     bilateral cataracts with lens implants  . floater bone surgery     bone removed from right hand  . FOOT SURGERY     RIGHT  . left foot surgery     removed tendon  and placed pins in foot  . LEFT HEART CATH AND CORONARY ANGIOGRAPHY N/A 09/26/2019   Procedure: LEFT HEART CATH AND CORONARY ANGIOGRAPHY;  Surgeon: Leonie Man, MD;  Location: [facility];  Service: Cardiovascular;  Laterality: N/A;  . OTHER SURGICAL HISTORY     Triple orthosesis with tendon release  . PLANTAR FASCIA SURGERY     both feet  . POLYPECTOMY  04/28/2019   Procedure: POLYPECTOMY;  Surgeon: Irene Shipper, MD;  Location: [facility];  Service: Endoscopy;;  . TONSILLECTOMY     Family History  Problem Relation Age of Onset  . Heart disease Mother   . Diabetes Mother   .  Hypertension Mother   . Hypertension Father   . Colon polyps Brother   . Colon polyps Brother   . Colon cancer Neg Hx   . Esophageal cancer Neg Hx   . Rectal cancer Neg Hx   . Stomach cancer Neg Hx     Social History   Tobacco Use  . Smoking status: Never Smoker  . Smokeless tobacco: Never Used  Substance Use Topics  . Alcohol use: No   Marital Status: Married  ROS  Review of Systems  Cardiovascular: Positive for chest pain and leg swelling (intermittent mild bilateral). Negative for dyspnea on exertion, near-syncope, orthopnea, palpitations and syncope.   Objective  Blood pressure 140/60, pulse 71, resp. rate 15, height 5\' 7"  (1.702 m), weight (!) 346 lb (156.9 kg), SpO2 96 %.  Vitals with BMI 03/05/2020 11/18/2019 09/30/2019  Height 5\' 7"  5\' 7"  5\' 7"   Weight 346 lbs 341 lbs 342 lbs  BMI 54.18 16.1 09.60  Systolic 454 098 119  Diastolic 60 60 60  Pulse 71 78 74     Physical Exam Constitutional:      Comments: Obese. No acute distress.  Cardiovascular:     Rate and Rhythm: Normal rate and regular rhythm.     Pulses:          Carotid pulses are 2+ on the right side with bruit and 2+ on the left side with bruit.      Radial pulses are 2+ on the right side and 2+ on the left side.       Dorsalis pedis pulses are 2+ on the right side and 2+ on the left side.       Posterior tibial pulses are 2+ on the right side and 2+ on the left side.     Heart sounds: Murmur heard. High-pitched blowing decrescendo early diastolic murmur is present with a grade of 2/4 at the upper right sternal border radiating to the apex.      Comments: Popliteal pulses difficult to palpate due to patient body habitus. No leg edema.  No JVD. Pulmonary:     Effort: Pulmonary effort is normal.     Breath sounds: Normal breath sounds.  Abdominal:     General: Bowel sounds are normal.     Palpations: Abdomen is soft.    Laboratory examination:   Recent Labs    07/04/19 1532 09/22/19 1440  09/30/19 1041  NA 135 141 142  K 4.4 3.8 4.4  CL 103 105 108*  CO2 23 19* 20  GLUCOSE 408* 184* 157*  BUN 15 14 14   CREATININE 1.23* 1.03* 1.18*  CALCIUM 10.1 10.3 10.7*  GFRNONAA 46* 58* 49*  GFRAA 54* 66 56*   CrCl cannot be calculated (Patient's most recent lab result is older than the maximum 21 days  allowed.).  CMP Latest Ref Rng & Units 09/30/2019 09/22/2019 07/04/2019  Glucose 65 - 99 mg/dL 157(H) 184(H) 408(H)  BUN 8 - 27 mg/dL 14 14 15   Creatinine 0.57 - 1.00 mg/dL 1.18(H) 1.03(H) 1.23(H)  Sodium 134 - 144 mmol/L 142 141 135  Potassium 3.5 - 5.2 mmol/L 4.4 3.8 4.4  Chloride 96 - 106 mmol/L 108(H) 105 103  CO2 20 - 29 mmol/L 20 19(L) 23  Calcium 8.7 - 10.3 mg/dL 10.7(H) 10.3 10.1  Total Protein 6.0 - 8.5 g/dL 6.6 - -  Total Bilirubin 0.0 - 1.2 mg/dL 0.7 - -  Alkaline Phos 39 - 117 IU/L 84 - -  AST 0 - 40 IU/L 42(H) - -  ALT 0 - 32 IU/L 28 - -   CBC Latest Ref Rng & Units 09/22/2019 07/04/2019 08/15/2018  WBC 3.4 - 10.8 x10E3/uL 6.3 5.7 -  Hemoglobin 11.1 - 15.9 g/dL 14.5 14.2 13.6  Hematocrit 34.0 - 46.6 % 43.1 42.3 40.0  Platelets 150 - 450 x10E3/uL 134(L) 117(L) -    Lipid Panel Recent Labs    09/30/19 1041  CHOL 119  TRIG 103  LDLCALC 63  HDL 37*  CHOLHDL 3.2    HEMOGLOBIN A1C No results found for: HGBA1C, MPG   TSH No results for input(s): TSH in the last 8760 hours.  External labs:   A1C 6.7% 11/2019  Medications and allergies   Allergies  Allergen Reactions  . Adhesive [Tape]     causes whelts and blisters  . Hydrocodone-Acetaminophen Itching    Tolerates small/ infrequent doses  . Oxycontin [Oxycodone Hcl]     "FELT LIKE HEAD WAS GOING TO EXPLODE"  . Trulicity [Dulaglutide]     KIDNEY INFECTION,INABILITY TO HAVE BOWEL MOVEMENTS,BLOOD IN URINE  . Ciprofloxacin Rash    Yeast infection and a severe rash   . Codeine Itching and Rash  . Other Palpitations    "makes me feel like I'm having a heart attack. -- Steroids      Outpatient  Medications Prior to Visit  Medication Sig Dispense Refill  . acetaminophen (TYLENOL) 500 MG tablet Take 1,000 mg by mouth every 6 (six) hours as needed for moderate pain.    Marland Kitchen aspirin EC 81 MG tablet Take 81 mg by mouth daily.    . budesonide-formoterol (SYMBICORT) 80-4.5 MCG/ACT inhaler Inhale 2 puffs into the lungs 2 (two) times daily as needed (shortness of breath).     . diclofenac (CATAFLAM) 50 MG tablet Take 50 mg by mouth daily.     . ergocalciferol (VITAMIN D2) 1.25 MG (50000 UT) capsule Take 50,000 Units by mouth every Saturday.     . hydrochlorothiazide (MICROZIDE) 12.5 MG capsule Take 12.5 mg by mouth daily.    Marland Kitchen levothyroxine (SYNTHROID, LEVOTHROID) 175 MCG tablet Take 175-262.5 mcg by mouth See admin instructions. Take 262.5 mcg  on Monday and Friday Tale 175 mcg on Tuesday, Wednesday, Thursday, Saturday, and Sunday    . linaclotide (LINZESS) 290 MCG CAPS capsule Take 290 mcg by mouth daily.     Marland Kitchen losartan (COZAAR) 50 MG tablet Take 50 mg by mouth daily.    . metFORMIN (GLUCOPHAGE-XR) 500 MG 24 hr tablet Take 1,000 mg by mouth 2 (two) times daily.    . metoprolol succinate (TOPROL XL) 25 MG 24 hr tablet Take 1 tablet (25 mg total) by mouth 2 (two) times daily. 60 tablet 3  . nitroGLYCERIN (NITROSTAT) 0.4 MG SL tablet Place 1 tablet (0.4 mg total) under the  tongue every 5 (five) minutes as needed for chest pain. 90 tablet 3  . nystatin cream (MYCOSTATIN) Apply 1 application topically See admin instructions. Apply once daily may apply a second time as needed for rash    . omeprazole (PRILOSEC) 40 MG capsule Take 40 mg by mouth daily.    . polyethylene glycol (MIRALAX / GLYCOLAX) 17 g packet Take 51 g by mouth daily as needed for severe constipation.    . Semaglutide (OZEMPIC, 0.25 OR 0.5 MG/DOSE, [place]) Inject 0.25 mg into the skin every Sunday.     . isosorbide mononitrate (IMDUR) 30 MG 24 hr tablet Take 1 tablet (30 mg total) by mouth daily. 90 tablet 3  . rosuvastatin (CRESTOR) 5 MG  tablet TAKE 1 TABLET BY MOUTH ONCE DAILY. 30 tablet 3   No facility-administered medications prior to visit.    Radiology:   Chest X-ray 2 View 07/04/2019 Faint opacity within the peripheral aspect of the left lower lobe could reflect a small developing infiltrate. Lungs are otherwise clear.  Cardiac Studies:   ZIO Monitor 7 days 09/17/2019 ZIO monitor was performed for 7 days and 2 hours beginning 08/27/2019 to evaluate palpitation. The rhythm throughout was sinus with minimum average and maximum heart rates of 50, 73 and 157 bpm. There were no pauses of 3 seconds or greater and there were no episodes of second or third-degree AV block or sinus node exit block. There where 5 triggered and 4 diary events predominantly seen with sinus rhythm although at times there were atrial or ventricular premature contractions. Ventricular ectopy was rare with PVCs and couplets.  There was one 7 beat run of PVCs present. Supraventricular ectopy was rare there were no episodes of atrial fibrillation or flutter.  There was one brief atrial tachycardias seen 9 complexes at a rate of 111 bpm  Conclusion, rare ventricular and supraventricular ectopy however there is one brief run of PVCs and one short episode of atrial tachycardia.  The diary and triggered events were predominantly sinus rhythm.  Myocardial Perfusion w/ Lexiscan Stress Test 09/17/2019  Nuclear stress EF: 61%.  The left ventricular ejection fraction is normal (55-65%).  No T wave inversion was noted during stress.  There was no ST segment deviation noted during stress.  Defect 1: There is a medium defect of moderate severity present in the basal inferior and mid inferior location. There is mild hypokinesis of the basal-mid inferior wall.  Findings consistent with prior myocardial infarction with no evidence of peri-infarct ischemia.  This is a low risk study.  Left Heart Catheterization 09/26/2019 The left ventricular systolic  function is normal. LV end diastolic pressure is normal. There is no aortic valve stenosis. Prox RCA to Mid RCA lesion is 10% stenosed. 1st Diag lesion is 55% stenosed. Mid LAD lesion is 10-20% stenosed. Angiographically normal coronary arteries with minimal CAD.  No obvious culprit lesion to explain symptoms or stress test results.  False Positive Nuclear Stress Test -> however cannot exclude possibility of microvascular disease. Normal LV function and EDP.  EKG:   EKG 03/05/2020: Normal sinus rhythm with rate of 69 bpm, left axis deviation, left intrafascicular block.  Incomplete right bundle branch block. Poor R wave progression, cannot exclude anteroanteroseptal infarct old.  Assessment     ICD-10-CM   1. Angina pectoris with normal coronary arteriogram (HCC)  I20.9 EKG 12-Lead    isosorbide mononitrate (IMDUR) 120 MG 24 hr tablet  2. Bilateral carotid bruits  R09.89 PCV CAROTID DUPLEX (BILATERAL)  3. Nonrheumatic aortic valve insufficiency  I35.1 PCV ECHOCARDIOGRAM COMPLETE  4. Primary hypertension  I10   5. Hypercholesteremia  E78.00 rosuvastatin (CRESTOR) 5 MG tablet     Medications Discontinued During This Encounter  Medication Reason  . isosorbide mononitrate (IMDUR) 30 MG 24 hr tablet Reorder  . rosuvastatin (CRESTOR) 5 MG tablet Reorder    Meds ordered this encounter  Medications  . isosorbide mononitrate (IMDUR) 120 MG 24 hr tablet    Sig: Take 1 tablet (120 mg total) by mouth daily.    Dispense:  90 tablet    Refill:  3  . rosuvastatin (CRESTOR) 5 MG tablet    Sig: Take 1 tablet (5 mg total) by mouth daily.    Dispense:  90 tablet    Refill:  3   Recommendations:   Joanna Alvarado is a 64 y.o. female with a past medical history of hypertension, controlled diabetes mellitus, hypercholesteremia,  sleep apnea using CPAP at night, GERD, hiatal hernia, fibromyalgia, hypothyroidism and chronic stable angina. She presents today for new patient evaluation.  The patient  has been having chest discomfort for several months. She had an abnormal nuclear stress in April 2021 which showed a moderate defect in the basal inferior and mid inferior location with mild hypokinesis. She then underwent left heart catheterization by Dr. Ellyn Hack in April 2021 showing minimal disease. She is being followed by Dr. Bettina Gavia who is treating with nitrates for angina as well as risk factor modifications. She presents today as she desires a second opinion. She continues having episodes chest discomfort every 2-3 weeks. The pain improves with sublingual nitrogycerin.  I would like to increase her isosorbide dinitrate from 30 mg to 90 mg daily to see if this improved her symptoms. I advised her to increase to 60 mg initially and if she tolerates this she can then increase to 90 mg daily.  If she does not tolerate increasing the dose, would consider Ranexa. We also discussed use of sublingual nitroglycerin when she does have chest pains.   On physical examination, she has a diastolic murmur consistent with aortic insufficiency. She has never had an echocardiogram before so we will obtain one. Additionally, she has bilateral carotid bruits so I will obtain carotid duplex to evaluate for stenosis.   Her blood pressure is elevated today, but she tells me it is typically well controlled at home. I have reviewed her external labs. Her diabetes is controlled, followed by endocrinology at [facility]. Lipids are well controlled. She is presently on Crestor 5 mg. We had a discussion regarding obesity and she tells me she has lost weight. She is encouraged to continue working on weight loss through healthy diet and portion control.   I will see her back in 6 weeks to follow up for angina as well as to discuss her test results.  Blair Heys, PA Student 03/06/20  5:45 PM   Patient seen and examined in conjunction with Blair Heys, PA second year student at [employer].  Time spent is in direct patient  face to face encounter not including the teaching and training involved.  I personally reviewed her coronary angiograms, she has no significant disease and very minimal luminal irregularity.  She probably has angina with normal coronary arteries in view of endothelial dysfunction.  Weight loss was discussed with the patient she is already losing some weight.  Her blood pressure was elevated today although blood pressure at home has been stable but I am hoping that  with increasing dose of isosorbide mononitrate her blood pressure will be stabilized.  This was a 60-minute encounter to review her external records, personal review of coronary angiogram and making complex medical decision.   Adrian Prows, MD, [facility] 03/06/2020, 5:45 PM Office: [phone]

## 2020-03-09 ENCOUNTER — Other Ambulatory Visit: Payer: PPO

## 2020-03-11 ENCOUNTER — Ambulatory Visit: Payer: PPO

## 2020-03-11 ENCOUNTER — Other Ambulatory Visit: Payer: Self-pay

## 2020-03-11 DIAGNOSIS — I351 Nonrheumatic aortic (valve) insufficiency: Secondary | ICD-10-CM | POA: Diagnosis not present

## 2020-03-11 DIAGNOSIS — R0989 Other specified symptoms and signs involving the circulatory and respiratory systems: Secondary | ICD-10-CM | POA: Diagnosis not present

## 2020-04-01 DIAGNOSIS — E039 Hypothyroidism, unspecified: Secondary | ICD-10-CM | POA: Diagnosis not present

## 2020-04-01 DIAGNOSIS — E559 Vitamin D deficiency, unspecified: Secondary | ICD-10-CM | POA: Diagnosis not present

## 2020-04-01 DIAGNOSIS — E119 Type 2 diabetes mellitus without complications: Secondary | ICD-10-CM | POA: Diagnosis not present

## 2020-04-01 DIAGNOSIS — E041 Nontoxic single thyroid nodule: Secondary | ICD-10-CM | POA: Diagnosis not present

## 2020-04-03 DIAGNOSIS — E039 Hypothyroidism, unspecified: Secondary | ICD-10-CM | POA: Diagnosis not present

## 2020-04-03 DIAGNOSIS — I10 Essential (primary) hypertension: Secondary | ICD-10-CM | POA: Diagnosis not present

## 2020-04-07 ENCOUNTER — Other Ambulatory Visit: Payer: Self-pay | Admitting: Cardiology

## 2020-04-19 ENCOUNTER — Encounter: Payer: Self-pay | Admitting: Cardiology

## 2020-04-19 ENCOUNTER — Ambulatory Visit: Payer: PPO | Admitting: Cardiology

## 2020-04-19 ENCOUNTER — Other Ambulatory Visit: Payer: Self-pay

## 2020-04-19 VITALS — BP 98/48 | HR 83 | Resp 16 | Ht 67.0 in | Wt 328.0 lb

## 2020-04-19 DIAGNOSIS — I209 Angina pectoris, unspecified: Secondary | ICD-10-CM

## 2020-04-19 DIAGNOSIS — G4733 Obstructive sleep apnea (adult) (pediatric): Secondary | ICD-10-CM

## 2020-04-19 DIAGNOSIS — I351 Nonrheumatic aortic (valve) insufficiency: Secondary | ICD-10-CM | POA: Diagnosis not present

## 2020-04-19 DIAGNOSIS — I77810 Thoracic aortic ectasia: Secondary | ICD-10-CM | POA: Diagnosis not present

## 2020-04-19 DIAGNOSIS — I10 Essential (primary) hypertension: Secondary | ICD-10-CM

## 2020-04-19 DIAGNOSIS — Z9989 Dependence on other enabling machines and devices: Secondary | ICD-10-CM | POA: Diagnosis not present

## 2020-04-19 NOTE — Patient Instructions (Signed)
Aortic Valve Regurgitation  Aortic valve regurgitation is a condition that happens when the aortic valve does not close all the way. The aortic valve is a gate-like structure between the lower left chamber of the heart (left ventricle) and the main blood vessel that supplies blood to the rest of the body (aorta). The aortic valve opens when the left ventricle squeezes to pump blood into the aorta, and it closes when the left ventricle relaxes. In aortic valve regurgitation, which may also be called aortic insufficiency, blood in the aorta leaks through the aortic valve after it has closed. This causes the heart to work harder than usual. If aortic valve regurgitation is not treated, it causes enlargement and weakening of the left ventricle. This can result in heart failure, abnormal heart rhythms (arrhythmias), and other dangerous conditions. If this condition develops suddenly, it may need to be treated with emergency surgery. What are the causes? This condition may be caused by anything that weakens the aortic valve, such as:  Severe high blood pressure (hypertension).  Infection of the inner lining of the heart or the heart valves (endocarditis).  A ballooning of a weak spot in the aorta wall (aortic aneurysm).  A tear or separation of the inner walls of the aorta (aortic dissection).  Injury (trauma) that damages the aortic valve.  Certain medicines.  Disease of a protein in the body called collagen (collagen vascular disease).  A heart problem (bicuspid aortic valve) that is present at birth (congenital).  An inflammatory condition that can develop after an untreated strep throat infection (rheumatic fever).  Complications during or after a heart surgery. This is rare. What are the signs or symptoms? Symptoms of this condition include:  Fatigue.  Shortness of breath.  Difficulty breathing while lying flat (orthopnea). You may need to sleep on two or more pillows to breathe  better.  Chest discomfort (angina).  Head bobbing.  A fluttering feeling in the chest (palpitations).  An irregular or faster-than-normal heartbeat. Symptoms usually develop gradually, unless this condition was caused by a major injury or by endocarditis. How is this diagnosed? This condition is diagnosed based on:  A physical exam.  An imaging test that uses sound waves to produce images of the heart (echocardiogram). You may also have other tests to confirm the diagnosis, including:  Chest X-ray.  MRI.  A test that records the electrical impulses of the heart (electrocardiogram, ECG).  CT angiogram (CTA). In this procedure, a large X-ray machine, called a CT scanner, takes detailed pictures of blood vessels after dye has been injected into the vessels.  Aortic angiogram. In this procedure, X-ray images are taken after dye has been injected into blood vessels. This tests the function of the aorta. How is this treated? Treatment depends on your symptoms, how severe the condition is, and what problems the condition is causing. Treatment may include:  Observation. If your condition is mild, you may not need treatment. However, you will need to have your condition checked regularly to make sure it is not getting worse or causing serious problems.  Medicines that help the heart work more efficiently.  Surgery to repair or replace the valve, in severe cases. Surgery is usually recommended if the left ventricle enlarges beyond a certain point. If aortic valve regurgitation occurs suddenly, surgery may be needed immediately. Follow these instructions at home:  Take over-the-counter and prescription medicines only as told by your health care provider.  Do not use any products that contain nicotine or  tobacco, such as cigarettes, e-cigarettes, and chewing tobacco. If you need help quitting, ask your health care provider.  If directed by your health care provider, avoid heavy weight  lifting and contact sports such as football.  Follow instructions from your health care provider about eating or drinking restrictions. Your health care provider may recommend that you: ? Limit alcohol use to:  0-1 drink a day for women.  0-2 drinks a day for men. ? Be aware of how much alcohol is in your drink. In the [place] one drink equals one 12 oz bottle of beer (355 mL), one 5 oz glass of wine (148 mL), or one 1 oz glass of hard liquor (44 mL). ? Eat foods that are high in fiber, such as fresh fruits and vegetables, whole grains, and beans. ? Eat less salt (sodium) and salty foods. Check ingredients and nutrition facts on packaged foods and beverages.  Keep all follow-up visits as told by your health care provider. This is important. You may need regular tests to monitor your condition and check how well your heart is pumping blood. Contact a health care provider if:  Your angina symptoms are more frequent or seem to be getting worse.  Your breathing problems seem to be getting worse.  You feel dizzy or close to fainting.  You have swelling in your feet, ankles, legs, or abdomen.  You urinate more than usual during the night (nocturia).  You have an unexplained fever that lasts 2 days or longer.  You develop new symptoms. Get help right away if you:  Have severe chest pain.  Have severe shortness of breath.  Feel rapid or irregular heartbeats.  Feel light-headed or you faint.  Have sudden, unexplained weight gain. Summary  Aortic valve regurgitation is a condition in which the aortic valve does not close all the way. This causes the heart to work harder than usual.  This condition may be treated with observation, medicines, or surgery.  Take over-the-counter and prescription medicines only as told by your health care provider.  Eat less salt (sodium) and salty foods. Check ingredients and nutrition facts on packaged foods and beverages.  Get help right away if  you have severe chest pain, shortness of breath, irregular heartbeats, sudden weight gain, or if you feel light-headed or you faint. This information is not intended to replace advice given to you by your health care provider. Make sure you discuss any questions you have with your health care provider. Document Revised: 02/26/2019 Document Reviewed: 02/12/2018 Elsevier Patient Education  [place].

## 2020-04-19 NOTE — Progress Notes (Signed)
Primary Physician/Referring:  Ronita Hipps, MD  Patient ID: Joanna Alvarado, female    DOB: April 19, 1956, 64 y.o.   MRN: [id number]  Chief Complaint  Patient presents with  . Angina pectoris with normal coronary arteriogram ([place])  . Follow-up  . Hypertension  . Results   HPI:    Joanna Alvarado  is a 64 y.o. female with a past medical history of hypertension, controlled diabetes mellitus, hypercholesteremia, morbid obesity with sleep apnea using CPAP at night, GERD, hiatal hernia, fibromyalgia, hypothyroidism and chronic stable angina with normal coronary arteries/minimal coronary artery disease by angiography in April 2021.  She has mild intermittent swelling in bilateral legs, typically after being on her feet. Denies shortness of breath, palpitations, orthopnea.  On her last office visit I increased the dose of isosorbide mononitrate from 30 mg to 90 mg.  She now presents for follow-up.  Also underwent echocardiogram and carotid duplex.  States that since last office visit symptoms of angina has slightly improved.  Past Medical History:  Diagnosis Date  . Abnormal myocardial perfusion study 09/26/2019  . Anemia   . Angina pectoris ([place]) - Class II-III 09/26/2019  . Arthritis    KNEES  . Benign neoplasm of ascending colon   . Benign neoplasm of cecum   . Benign neoplasm of descending colon   . Benign neoplasm of sigmoid colon   . Benign neoplasm of transverse colon   . Bowel habit changes   . Cataract    BILATERAL-REMOVED  . Chronic postoperative pain 12/02/2012  . Diabetes mellitus without complication ([place])   . Disturbance of skin sensation 12/02/2012  . Fibromyalgia   . GERD (gastroesophageal reflux disease)   . History of colonic polyps   . Hypertension   . Hypothyroidism   . Myalgia and myositis, unspecified 12/02/2012  . Personal history of colonic polyps   . Primary hypothyroidism 11/30/2015  . Senile nuclear sclerosis 04/24/2014  . Sleep apnea    uses c-pap  .  Thyroid disease   . Thyroid nodule 11/30/2015  . Type 2 diabetes mellitus without complications ([place]) 79/39/0300  . Vitamin D deficiency 11/30/2015   Past Surgical History:  Procedure Laterality Date  . ABDOMINAL HYSTERECTOMY    . APPENDECTOMY    . CHOLECYSTECTOMY    . COLONOSCOPY N/A 07/27/2015   Procedure: COLONOSCOPY;  Surgeon: Irene Shipper, MD;  Location: [facility];  Service: Endoscopy;  Laterality: N/A;  . COLONOSCOPY WITH PROPOFOL N/A 04/28/2019   Procedure: COLONOSCOPY WITH PROPOFOL;  Surgeon: Irene Shipper, MD;  Location: [facility];  Service: Endoscopy;  Laterality: N/A;  . EYE SURGERY     bilateral cataracts with lens implants  . floater bone surgery     bone removed from right hand  . FOOT SURGERY     RIGHT  . left foot surgery     removed tendon and placed pins in foot  . LEFT HEART CATH AND CORONARY ANGIOGRAPHY N/A 09/26/2019   Procedure: LEFT HEART CATH AND CORONARY ANGIOGRAPHY;  Surgeon: Leonie Man, MD;  Location: [facility];  Service: Cardiovascular;  Laterality: N/A;  . OTHER SURGICAL HISTORY     Triple orthosesis with tendon release  . PLANTAR FASCIA SURGERY     both feet  . POLYPECTOMY  04/28/2019   Procedure: POLYPECTOMY;  Surgeon: Irene Shipper, MD;  Location: [facility];  Service: Endoscopy;;  . TONSILLECTOMY     Family History  Problem Relation Age of Onset  .  Heart disease Mother   . Diabetes Mother   . Hypertension Mother   . Hypertension Father   . Colon polyps Brother   . Colon polyps Brother   . Colon cancer Neg Hx   . Esophageal cancer Neg Hx   . Rectal cancer Neg Hx   . Stomach cancer Neg Hx     Social History   Tobacco Use  . Smoking status: Never Smoker  . Smokeless tobacco: Never Used  Substance Use Topics  . Alcohol use: No   Marital Status: Married  ROS  Review of Systems  Constitutional: Positive for malaise/fatigue.  Cardiovascular: Positive for chest pain and leg swelling (intermittent mild bilateral).  Negative for dyspnea on exertion, near-syncope, orthopnea, palpitations and syncope.  Respiratory: Positive for snoring (on cpap).    Objective  Blood pressure (!) 98/48, pulse 83, resp. rate 16, height 5\' 7"  (1.702 m), weight (!) 328 lb (148.8 kg), SpO2 100 %.  Vitals with BMI 04/19/2020 03/05/2020 11/18/2019  Height 5\' 7"  5\' 7"  5\' 7"   Weight 328 lbs 346 lbs 341 lbs  BMI 51.36 34.19 37.9  Systolic 98 024 097  Diastolic 48 60 60  Pulse 83 71 78     Physical Exam Constitutional:      Comments: Morbidly obese. No acute distress.  Cardiovascular:     Rate and Rhythm: Normal rate and regular rhythm.     Pulses:          Carotid pulses are 2+ on the right side with bruit and 2+ on the left side with bruit.      Radial pulses are 2+ on the right side and 2+ on the left side.       Dorsalis pedis pulses are 2+ on the right side and 2+ on the left side.       Posterior tibial pulses are 2+ on the right side and 2+ on the left side.     Heart sounds: Murmur heard. High-pitched blowing decrescendo early diastolic murmur is present with a grade of 2/4 at the upper right sternal border radiating to the apex.      Comments: Popliteal pulses difficult to palpate due to patient body habitus. No leg edema.  No JVD. Pulmonary:     Effort: Pulmonary effort is normal.     Breath sounds: Normal breath sounds.  Abdominal:     General: Bowel sounds are normal.     Palpations: Abdomen is soft.     Comments: Large pannus  Neurological:     General: No focal deficit present.     Mental Status: She is oriented to person, place, and time.    Laboratory examination:   Recent Labs    07/04/19 1532 09/22/19 1440 09/30/19 1041  NA 135 141 142  K 4.4 3.8 4.4  CL 103 105 108*  CO2 23 19* 20  GLUCOSE 408* 184* 157*  BUN 15 14 14   CREATININE 1.23* 1.03* 1.18*  CALCIUM 10.1 10.3 10.7*  GFRNONAA 46* 58* 49*  GFRAA 54* 66 56*   CrCl cannot be calculated (Patient's most recent lab result is older  than the maximum 21 days allowed.).  CMP Latest Ref Rng & Units 09/30/2019 09/22/2019 07/04/2019  Glucose 65 - 99 mg/dL 157(H) 184(H) 408(H)  BUN 8 - 27 mg/dL 14 14 15   Creatinine 0.57 - 1.00 mg/dL 1.18(H) 1.03(H) 1.23(H)  Sodium 134 - 144 mmol/L 142 141 135  Potassium 3.5 - 5.2 mmol/L 4.4 3.8 4.4  Chloride 96 -  106 mmol/L 108(H) 105 103  CO2 20 - 29 mmol/L 20 19(L) 23  Calcium 8.7 - 10.3 mg/dL 10.7(H) 10.3 10.1  Total Protein 6.0 - 8.5 g/dL 6.6 - -  Total Bilirubin 0.0 - 1.2 mg/dL 0.7 - -  Alkaline Phos 39 - 117 IU/L 84 - -  AST 0 - 40 IU/L 42(H) - -  ALT 0 - 32 IU/L 28 - -   CBC Latest Ref Rng & Units 09/22/2019 07/04/2019 08/15/2018  WBC 3.4 - 10.8 x10E3/uL 6.3 5.7 -  Hemoglobin 11.1 - 15.9 g/dL 14.5 14.2 13.6  Hematocrit 34.0 - 46.6 % 43.1 42.3 40.0  Platelets 150 - 450 x10E3/uL 134(L) 117(L) -    Lipid Panel Recent Labs    09/30/19 1041  CHOL 119  TRIG 103  LDLCALC 63  HDL 37*  CHOLHDL 3.2    HEMOGLOBIN A1C No results found for: HGBA1C, MPG   TSH No results for input(s): TSH in the last 8760 hours.  External labs:   A1C 6.7% 11/2019  Medications and allergies   Allergies  Allergen Reactions  . Adhesive [Tape]     causes whelts and blisters  . Hydrocodone-Acetaminophen Itching    Tolerates small/ infrequent doses  . Oxycontin [Oxycodone Hcl]     "FELT LIKE HEAD WAS GOING TO EXPLODE"  . Trulicity [Dulaglutide]     KIDNEY INFECTION,INABILITY TO HAVE BOWEL MOVEMENTS,BLOOD IN URINE  . Ciprofloxacin Rash    Yeast infection and a severe rash   . Codeine Itching and Rash  . Other Palpitations    "makes me feel like I'm having a heart attack. -- Steroids      Outpatient Medications Prior to Visit  Medication Sig Dispense Refill  . acetaminophen (TYLENOL) 500 MG tablet Take 1,000 mg by mouth every 6 (six) hours as needed for moderate pain.    Marland Kitchen aspirin EC 81 MG tablet Take 81 mg by mouth daily.    . budesonide-formoterol (SYMBICORT) 80-4.5 MCG/ACT inhaler  Inhale 2 puffs into the lungs 2 (two) times daily as needed (shortness of breath).     . diclofenac (CATAFLAM) 50 MG tablet Take 50 mg by mouth daily.     . ergocalciferol (VITAMIN D2) 1.25 MG (50000 UT) capsule Take 50,000 Units by mouth every Saturday.     . hydrochlorothiazide (MICROZIDE) 12.5 MG capsule Take 12.5 mg by mouth daily.    . isosorbide mononitrate (IMDUR) 120 MG 24 hr tablet Take 1 tablet (120 mg total) by mouth daily. 90 tablet 3  . levothyroxine (SYNTHROID, LEVOTHROID) 175 MCG tablet Take 175-262.5 mcg by mouth See admin instructions. Take 262.5 mcg  on Monday and Friday Tale 175 mcg on Tuesday, Wednesday, Thursday, Saturday, and Sunday    . linaclotide (LINZESS) 290 MCG CAPS capsule Take 290 mcg by mouth daily.     Marland Kitchen losartan (COZAAR) 50 MG tablet Take 50 mg by mouth daily.    . metFORMIN (GLUCOPHAGE-XR) 500 MG 24 hr tablet Take 1,000 mg by mouth 2 (two) times daily.    . nitroGLYCERIN (NITROSTAT) 0.4 MG SL tablet Place 1 tablet (0.4 mg total) under the tongue every 5 (five) minutes as needed for chest pain. 90 tablet 3  . nystatin cream (MYCOSTATIN) Apply 1 application topically See admin instructions. Apply once daily may apply a second time as needed for rash    . omeprazole (PRILOSEC) 40 MG capsule Take 40 mg by mouth daily.    . rosuvastatin (CRESTOR) 5 MG tablet Take 1 tablet (  5 mg total) by mouth daily. 90 tablet 3  . Semaglutide (OZEMPIC, 0.25 OR 0.5 MG/DOSE, [place]) Inject 0.25 mg into the skin every Sunday.     . metoprolol succinate (TOPROL-XL) 25 MG 24 hr tablet TAKE 1 TABLET BY MOUTH TWICE DAILY. 60 tablet 3  . polyethylene glycol (MIRALAX / GLYCOLAX) 17 g packet Take 51 g by mouth daily as needed for severe constipation.     No facility-administered medications prior to visit.    Radiology:   Chest X-ray 2 View 07/04/2019 Faint opacity within the peripheral aspect of the left lower lobe could reflect a small developing infiltrate. Lungs are  otherwise clear.  Cardiac Studies:   ZIO Monitor 7 days 09/17/2019 ZIO monitor was performed for 7 days and 2 hours beginning 08/27/2019 to evaluate palpitation. The rhythm throughout was sinus with minimum average and maximum heart rates of 50, 73 and 157 bpm. There were no pauses of 3 seconds or greater and there were no episodes of second or third-degree AV block or sinus node exit block. There where 5 triggered and 4 diary events predominantly seen with sinus rhythm although at times there were atrial or ventricular premature contractions. Ventricular ectopy was rare with PVCs and couplets.  There was one 7 beat run of PVCs present. Supraventricular ectopy was rare there were no episodes of atrial fibrillation or flutter.  There was one brief atrial tachycardias seen 9 complexes at a rate of 111 bpm  Conclusion, rare ventricular and supraventricular ectopy however there is one brief run of PVCs and one short episode of atrial tachycardia.  The diary and triggered events were predominantly sinus rhythm.  Myocardial Perfusion w/ Lexiscan Stress Test 09/17/2019  Nuclear stress EF: 61%.  The left ventricular ejection fraction is normal (55-65%).  No T wave inversion was noted during stress.  There was no ST segment deviation noted during stress.  Defect 1: There is a medium defect of moderate severity present in the basal inferior and mid inferior location. There is mild hypokinesis of the basal-mid inferior wall.  Findings consistent with prior myocardial infarction with no evidence of peri-infarct ischemia.  This is a low risk study.  Left Heart Catheterization 09/26/2019 The left ventricular systolic function is normal. LV end diastolic pressure is normal. There is no aortic valve stenosis. Prox RCA to Mid RCA lesion is 10% stenosed. 1st Diag lesion is 55% stenosed. Mid LAD lesion is 10-20% stenosed. Angiographically normal coronary arteries with minimal CAD.  No obvious culprit  lesion to explain symptoms or stress test results.  False Positive Nuclear Stress Test -> however cannot exclude possibility of microvascular disease. Normal LV function and EDP.  Carotid artery duplex 03/10/2020: The bifurcation, internal, external and common carotid arteries reveal no evidence of significant stenosis, bilaterally. Antegrade right vertebral artery flow. Antegrade left vertebral artery flow.  Echocardiogram 03/11/2020: Left ventricle cavity is normal in size. Moderate concentric hypertrophy of the left ventricle. Normal global wall motion. Normal LV systolic function with visual EF 50-55%. Indeterminate diastolic filling pattern. The aortic root is dilated, measuring 4.0 cm at sinotubular junction.  Left atrial cavity is mildly dilated. Trileaflet aortic valve.  Moderate to severe aortic regurgitation. Mild to moderate mitral regurgitation. IVC is dilated with blunted respiratory response. Estimated RA pressure 10-15 mmHg.  EKG:   EKG 03/05/2020: Normal sinus rhythm with rate of 69 bpm, left axis deviation, left intrafascicular block.  Incomplete right bundle branch block. Poor R wave progression, cannot exclude anteroanteroseptal infarct old.  Assessment     ICD-10-CM   1. Angina pectoris with normal coronary arteriogram (HCC)  I20.9   2. Nonrheumatic aortic valve insufficiency  I35.1   3. Moderate to severe aortic valve regurgitation  I35.1   4. Primary hypertension  I10   5. OSA on CPAP  G47.33 Ambulatory referral to Sleep Studies   Z99.89   6. Aortic root dilatation (HCC)  I77.810      Medications Discontinued During This Encounter  Medication Reason  . polyethylene glycol (MIRALAX / GLYCOLAX) 17 g packet Patient Preference  . metoprolol succinate (TOPROL-XL) 25 MG 24 hr tablet Discontinued by provider    No orders of the defined types were placed in this encounter.  Recommendations:   VAEDA WESTALL is a 65 y.o. female with a past medical history of  hypertension, controlled diabetes mellitus, hypercholesteremia, morbid obesity with obstructive sleep apnea using CPAP at night, GERD, hiatal hernia, fibromyalgia, hypothyroidism and chronic stable angina with normal coronary arteries/minimal coronary artery disease by angiography in April 2021.  On her last office visit I increased the dose of isosorbide mononitrate from 30 mg to 90 mg.  She now presents for follow-up.  Also underwent echocardiogram and carotid duplex.  I reviewed the results of the carotid artery duplex, fortunately no significant disease and suspect conducted sounds from aortic regurgitation and increased LVOT flow.  I also suspect that her angina pectoris is related to moderate to severe aortic regurgitation.  I will discontinue metoprolol as her blood pressure is soft although asymptomatic.  Suspect aortic root dilatation is related to moderately severe AI.  With regard to obstructive sleep apnea, states that it was many years ago that she got tested and she has been compliant with using it regularly but states that she has started to feel fatigued again.  I will refer her to be evaluated by Dr. Rexene Alberts.  With regard to obesity, although she is morbidly obese, over the past 6 to 8 months she has lost 50 pounds in weight.  I have given her positive reinforcement regarding the same.  Advised her that she will eventually need aortic valve replacement if symptoms of angina do not improve or if she develops any signs of heart failure.  Advised her that weight loss would certainly help with recovery during the surgery.  She appears to be motivated.  I would like to see her back in 3 months for close monitoring.  I spent 40 minutes in discussions regarding complex valvular heart disease, angina pectoris, coordination of care and discussion regarding sleep apnea and fatigue.  Weight loss was also discussed in detail.   Adrian Prows, MD, [facility] 04/19/2020, 4:17 PM Office: [phone]

## 2020-04-20 ENCOUNTER — Other Ambulatory Visit: Payer: Self-pay

## 2020-04-20 MED ORDER — HYDROCHLOROTHIAZIDE 12.5 MG PO CAPS
12.5000 mg | ORAL_CAPSULE | Freq: Every day | ORAL | 3 refills | Status: DC
Start: 2020-04-20 — End: 2021-11-30

## 2020-04-20 NOTE — Telephone Encounter (Signed)
From patient.

## 2020-04-22 DIAGNOSIS — E0789 Other specified disorders of thyroid: Secondary | ICD-10-CM | POA: Diagnosis not present

## 2020-04-22 DIAGNOSIS — E042 Nontoxic multinodular goiter: Secondary | ICD-10-CM | POA: Diagnosis not present

## 2020-05-07 DIAGNOSIS — J329 Chronic sinusitis, unspecified: Secondary | ICD-10-CM | POA: Diagnosis not present

## 2020-05-07 DIAGNOSIS — J40 Bronchitis, not specified as acute or chronic: Secondary | ICD-10-CM | POA: Diagnosis not present

## 2020-05-14 NOTE — Telephone Encounter (Signed)
From patient.

## 2020-05-19 ENCOUNTER — Ambulatory Visit: Payer: PPO | Admitting: Cardiology

## 2020-05-20 ENCOUNTER — Encounter: Payer: Self-pay | Admitting: Neurology

## 2020-05-20 ENCOUNTER — Ambulatory Visit: Payer: PPO | Admitting: Neurology

## 2020-05-20 VITALS — BP 102/51 | HR 69 | Ht 67.0 in | Wt 322.0 lb

## 2020-05-20 DIAGNOSIS — Z9989 Dependence on other enabling machines and devices: Secondary | ICD-10-CM

## 2020-05-20 DIAGNOSIS — G4733 Obstructive sleep apnea (adult) (pediatric): Secondary | ICD-10-CM | POA: Diagnosis not present

## 2020-05-20 DIAGNOSIS — Z6841 Body Mass Index (BMI) 40.0 and over, adult: Secondary | ICD-10-CM

## 2020-05-20 DIAGNOSIS — R0609 Other forms of dyspnea: Secondary | ICD-10-CM

## 2020-05-20 DIAGNOSIS — I351 Nonrheumatic aortic (valve) insufficiency: Secondary | ICD-10-CM | POA: Diagnosis not present

## 2020-05-20 DIAGNOSIS — R0600 Dyspnea, unspecified: Secondary | ICD-10-CM

## 2020-05-20 DIAGNOSIS — I209 Angina pectoris, unspecified: Secondary | ICD-10-CM

## 2020-05-20 NOTE — Progress Notes (Signed)
other

## 2020-05-20 NOTE — Progress Notes (Addendum)
Subjective:    Patient ID: Joanna Alvarado is a 64 y.o. female.  HPI     Star Age, MD, PhD [facility] [address] [place], [place] [address]  Dear Ulice Dash,   I saw your patient Joanna Alvarado, upon your kind request, in my sleep clinic today for initial consultation of her sleep disorder, in particular, evaluation of her prior diagnosis of obstructive sleep apnea.  The patient is unaccompanied today.  As you know, Joanna Alvarado is a 64 year old right-handed woman with an underlying medical history of vitamin D deficiency, thyroid disease, hypothyroidism, hypertension, reflux disease, intermittent, aortic valve insufficiency, with moderate to severe aortic regurgitation, anemia, arthritis, and morbid obesity with a BMI of over 50, who was previously diagnosed with obstructive sleep apnea and placed on CPAP therapy.  Prior sleep study results are not available for my review today.  Her original sleep studies were probably 15 years ago.  She reports having to studies back to back, likely diagnostic followed by a titration study.  Her current machine was given to her some 5 years ago after she had another set of studies through Dr. Alcide Clever in [place].  Her DME company was [employer].  She has not received supplies in the recent months or even years, she needs new supplies including a new headgear.  She uses nasal pillows.  Sometimes she has nostril irritation on the left side.  Of note, she fractured her nose some 20 years ago and has subsequently had a significant deviated septum and nasal deformity.   I reviewed your office note from 04/19/2020.  Her Epworth sleepiness score is 1 out of 24 today, fatigue severity score is 63 out of 63.  She feels easily short of breath with mild exertion, even just walking from the parking lot to the clinic today.  She has been consistent with her CPAP.  She typically does not skip the night.  She has a variable  sleep schedule depending on various factors, often she helps take care of her grandbabies.  She goes to bed anywhere between 9 PM and 1 AM and rise time is anywhere between 5 AM and 9 AM.  I was able to review her CPAP compliance data from 04/20/2020 through 05/19/2020, which is a total of 30 days, during which time she used her machine every night with percent use days greater than 4 hours at 100%, indicating superb compliance, average usage of 9 hours and 17 minutes, residual AHI at goal at 0.4/h, leak acceptable with a 95th percentile at 13.3 L/min on a pressure of 9 cm.  She reports that she could not tolerate the excess humidity and has reduced the humidity setting from 4 down to 2. She is working on weight loss.  She has lost some weight thus far.  She may need aortic valve replacement eventually.  She has a follow-up appointment with you in mid February.  She is married and lives with her husband.  She has a son and a daughter and 2 stepchildren.  She is retired, she worked as a [employer] representative.  She had a tonsillectomy and appendectomy at age 17.  She has undergone multiple other surgeries.  She also had significant left foot injury and surgery as well as hardware in place.  He is a non-smoker and does not drink alcohol and drinks caffeine in the form of coffee, 1 cup/day on average.   Her Past Medical History Is Significant For:  Past Medical History:  Diagnosis Date  . Abnormal myocardial perfusion study 09/26/2019  . Anemia   . Angina pectoris ([place]) - Class II-III 09/26/2019  . Arthritis    KNEES  . Benign neoplasm of ascending colon   . Benign neoplasm of cecum   . Benign neoplasm of descending colon   . Benign neoplasm of sigmoid colon   . Benign neoplasm of transverse colon   . Bowel habit changes   . Cataract    BILATERAL-REMOVED  . Chronic postoperative pain 12/02/2012  . Diabetes mellitus without complication ([place])   . Disturbance of skin sensation 12/02/2012  .  Fibromyalgia   . GERD (gastroesophageal reflux disease)   . History of colonic polyps   . Hypertension   . Hypothyroidism   . Myalgia and myositis, unspecified 12/02/2012  . Personal history of colonic polyps   . Primary hypothyroidism 11/30/2015  . Senile nuclear sclerosis 04/24/2014  . Sleep apnea    uses c-pap  . Thyroid disease   . Thyroid nodule 11/30/2015  . Type 2 diabetes mellitus without complications ([place]) 42/68/3419  . Vitamin D deficiency 11/30/2015    Her Past Surgical History Is Significant For: Past Surgical History:  Procedure Laterality Date  . ABDOMINAL HYSTERECTOMY    . APPENDECTOMY    . CHOLECYSTECTOMY    . COLONOSCOPY N/A 07/27/2015   Procedure: COLONOSCOPY;  Surgeon: Irene Shipper, MD;  Location: [facility];  Service: Endoscopy;  Laterality: N/A;  . COLONOSCOPY WITH PROPOFOL N/A 04/28/2019   Procedure: COLONOSCOPY WITH PROPOFOL;  Surgeon: Irene Shipper, MD;  Location: [facility];  Service: Endoscopy;  Laterality: N/A;  . EYE SURGERY     bilateral cataracts with lens implants  . floater bone surgery     bone removed from right hand  . FOOT SURGERY     RIGHT  . left foot surgery     removed tendon and placed pins in foot  . LEFT HEART CATH AND CORONARY ANGIOGRAPHY N/A 09/26/2019   Procedure: LEFT HEART CATH AND CORONARY ANGIOGRAPHY;  Surgeon: Leonie Man, MD;  Location: [facility];  Service: Cardiovascular;  Laterality: N/A;  . OTHER SURGICAL HISTORY     Triple orthosesis with tendon release  . PLANTAR FASCIA SURGERY     both feet  . POLYPECTOMY  04/28/2019   Procedure: POLYPECTOMY;  Surgeon: Irene Shipper, MD;  Location: [facility];  Service: Endoscopy;;  . TONSILLECTOMY      Her Family History Is Significant For: Family History  Problem Relation Age of Onset  . Heart disease Mother   . Diabetes Mother   . Hypertension Mother   . Hypertension Father   . Colon polyps Brother   . Colon polyps Brother   . Stroke Maternal Grandmother    . Heart disease Paternal Grandmother   . Throat cancer Paternal Grandfather   . Colon cancer Neg Hx   . Esophageal cancer Neg Hx   . Rectal cancer Neg Hx   . Stomach cancer Neg Hx     Her Social History Is Significant For: Social History   Socioeconomic History  . Marital status: Married    Spouse name: Not on file  . Number of children: 2  . Years of education: Not on file  . Highest education level: Some college, no degree  Occupational History    Comment: retired  Tobacco Use  . Smoking status: Never Smoker  . Smokeless tobacco: Never Used  Vaping Use  .  Vaping Use: Never used  Substance and Sexual Activity  . Alcohol use: Never  . Drug use: Never  . Sexual activity: Not on file  Other Topics Concern  . Not on file  Social History Narrative   Lives with husband   caffeine 1 c daily   Social Determinants of Health   Financial Resource Strain: Not on file  Food Insecurity: Not on file  Transportation Needs: Not on file  Physical Activity: Not on file  Stress: Not on file  Social Connections: Not on file    Her Allergies Are:  Allergies  Allergen Reactions  . Adhesive [Tape]     causes whelts and blisters  . Hydrocodone-Acetaminophen Itching    Tolerates small/ infrequent doses  . Oxycontin [Oxycodone Hcl]     "FELT LIKE HEAD WAS GOING TO EXPLODE"  . Trulicity [Dulaglutide]     KIDNEY INFECTION,INABILITY TO HAVE BOWEL MOVEMENTS,BLOOD IN URINE  . Ciprofloxacin Rash    Yeast infection and a severe rash   . Codeine Itching and Rash  . Other Palpitations    "makes me feel like I'm having a heart attack. -- Steroids   :   Her Current Medications Are:  Outpatient Encounter Medications as of 05/20/2020  Medication Sig  . aspirin EC 81 MG tablet Take 81 mg by mouth daily.  . budesonide-formoterol (SYMBICORT) 80-4.5 MCG/ACT inhaler Inhale 2 puffs into the lungs 2 (two) times daily as needed (shortness of breath).   . diclofenac (CATAFLAM) 50 MG tablet Take  50 mg by mouth daily.   . ergocalciferol (VITAMIN D2) 1.25 MG (50000 UT) capsule Take 50,000 Units by mouth every Saturday.   . hydrochlorothiazide (MICROZIDE) 12.5 MG capsule Take 1 capsule (12.5 mg total) by mouth daily.  . isosorbide mononitrate (IMDUR) 120 MG 24 hr tablet Take 1 tablet (120 mg total) by mouth daily.  Marland Kitchen levothyroxine (SYNTHROID, LEVOTHROID) 175 MCG tablet Take 175-262.5 mcg by mouth See admin instructions. Take 262.5 mcg  on Monday and Friday Tale 175 mcg on Tuesday, Wednesday, Thursday, Saturday, and Sunday  . losartan (COZAAR) 50 MG tablet Take 50 mg by mouth daily.  . metFORMIN (GLUCOPHAGE-XR) 500 MG 24 hr tablet Take 1,000 mg by mouth 2 (two) times daily.  Marland Kitchen omeprazole (PRILOSEC) 40 MG capsule Take 40 mg by mouth daily.  . rosuvastatin (CRESTOR) 5 MG tablet Take 1 tablet (5 mg total) by mouth daily.  . Semaglutide (OZEMPIC, 0.25 OR 0.5 MG/DOSE, [place]) Inject 0.25 mg into the skin every Sunday.   Marland Kitchen acetaminophen (TYLENOL) 500 MG tablet Take 1,000 mg by mouth every 6 (six) hours as needed for moderate pain. (Patient not taking: Reported on 05/20/2020)  . linaclotide (LINZESS) 290 MCG CAPS capsule Take 290 mcg by mouth daily.  (Patient not taking: Reported on 05/20/2020)  . nitroGLYCERIN (NITROSTAT) 0.4 MG SL tablet Place 1 tablet (0.4 mg total) under the tongue every 5 (five) minutes as needed for chest pain.  Marland Kitchen nystatin cream (MYCOSTATIN) Apply 1 application topically See admin instructions. Apply once daily may apply a second time as needed for rash (Patient not taking: Reported on 05/20/2020)   No facility-administered encounter medications on file as of 05/20/2020.  :  Review of Systems:  Out of a complete 14 point review of systems, all are reviewed and negative with the exception of these symptoms as listed below: Review of Systems  Neurological:       Rm 2 New pt for OSA on CPAP- "check my breathing with  CPAP. Its been yrs since checked, wake up with air escaping,  mouth open. Sometimes I make funny noises."  Epworth Sleepiness Scale 0= would never doze 1= slight chance of dozing 2= moderate chance of dozing 3= high chance of dozing  Sitting and reading:0 Watching TV:0 Sitting inactive in a public place (ex. Theater or meeting):0 As a passenger in a car for an hour without a break:0 Lying down to rest in the afternoon:1 Sitting and talking to someone:0 Sitting quietly after lunch (no alcohol):0 In a car, while stopped in traffic:0 Total:1    Objective:  Neurological Exam  Physical Exam Physical Examination:   Vitals:   05/20/20 1055  BP: (!) 102/51  Pulse: 69    General Examination: The patient is a very pleasant 64 y.o. female in no acute distress. She appears well-developed and well-nourished and well groomed.   HEENT: Normocephalic, atraumatic, pupils are equal, round and reactive to light, extraocular tracking is good without limitation to gaze excursion or nystagmus noted. Hearing is grossly intact. Face is symmetric with normal facial animation. Speech is clear with no dysarthria noted. There is no hypophonia. There is no lip, neck/head, jaw or voice tremor. Neck is supple with full range of passive and active motion. There are no carotid bruits on auscultation. Oropharynx exam reveals: mild mouth dryness, adequate dental hygiene and mild airway crowding, due to small airway entry, tonsils are absent, redundant soft palate noted.  Tongue protrudes centrally in palate elevates symmetrically, Mallampati class II.  Neck circumference of 15-3/8 inches.  Chest: Clear to auscultation without wheezing, rhonchi or crackles noted.  Heart: S1+S2+0, regular with a mild diastolic heart murmur noted on auscultation.    Abdomen: Soft, non-tender and non-distended with normal bowel sounds appreciated on auscultation.  Extremities: There is no pitting edema in the distal lower extremities bilaterally.   Skin: Warm and dry without trophic  changes noted.   Musculoskeletal: exam reveals right hip discomfort and left foot discomfort.  She has some decrease in mobility in the left foot.   Neurologically:  Mental status: The patient is awake, alert and oriented in all 4 spheres. Her immediate and remote memory, attention, language skills and fund of knowledge are appropriate. There is no evidence of aphasia, agnosia, apraxia or anomia. Speech is clear with normal prosody and enunciation. Thought process is linear. Mood is normal and affect is normal.  Cranial nerves II - XII are as described above under HEENT exam.  Motor exam: Normal bulk, strength and tone is noted. There is no tremor, Romberg is not tested due to safety concerns.  Fine motor skills and coordination: grossly intact.  Cerebellar testing: No dysmetria or intention tremor. There is no truncal or gait ataxia.  Sensory exam: intact to light touch in the upper and lower extremities.  Gait, station and balance: She stands with difficulty.  She pushes herself up.  She stands naturally a little wider base, she has some discomfort in her right hip and needs to wait a little bit before she can walk.  She has a limp secondary to the left foot.    Assessment and Plan:  In summary, Joanna Alvarado is a very pleasant 64 y.o.-year old female with an underlying medical history of vitamin D deficiency, thyroid disease, hypothyroidism, hypertension, reflux disease, intermittent, aortic valve insufficiency, with moderate to severe aortic regurgitation, anemia, arthritis, and morbid obesity with a BMI of over 50, who presents for evaluation of her obstructive sleep apnea.  She has  undergone several sleep studies over the years.  She has not had a reevaluation in over 5 years.  Her machine may be eligible for replacement as well.  She has not received new supplies and is need for new supplies.  She is advised that we will need some form of diagnostic data to support a supply prescription through  the insurance and perhaps even a new machine covered through the insurance.  She is fully compliant with her current CPAP machine with a pressure of 9 cm, apnea score is low, leak is acceptable.  She is commended for her treatment adherence and advised to be consistent ongoing with her current machine.  We mutually agreed to pursue a home sleep test for reevaluation at this time.  We will call her to schedule this soon.  She may need aortic valve replacement in the near future.  For now, she has a follow-up appointment with your office in February.  She is working on weight loss.  She is advised to continue to strive for weight loss and continue with her current CPAP.  I plan to see her back after her home sleep test.  I answered all her questions today and she was in agreement.   Thank you very much for allowing me to participate in the care of this nice patient. If I can be of any further assistance to you please do not hesitate to call me at [phone].  Sincerely,   Star Age, MD, PhD

## 2020-05-20 NOTE — Patient Instructions (Addendum)
Thank you for choosing [facility] for your sleep related care! It was nice to meet you today! I appreciate that you entrust me with your sleep related healthcare concerns. I hope, I was able to address at least some of your concerns today, and that I can help you feel reassured and also get better.    Here is what we discussed today and what we came up with as our plan for you:    Based on your symptoms and your exam I believe you are still at risk for obstructive sleep apnea and would benefit from re-evaluation as it has been several years and you need new supplies and may even be eligible for a new machine. Therefore, I think we should proceed with a sleep test. As discussed, we will do a home sleep test, as we want to confirm your sleep apnea diagnosis to be able to prescribe new supplies and probably also a new machine.   You are fully compliant with your current CPAP machine.  Please continue with it.  Please remember, the risks and ramifications of moderate to severe obstructive sleep apnea or OSA are: Cardiovascular disease, including congestive heart failure, stroke, difficult to control hypertension, arrhythmias, and even type 2 diabetes has been linked to untreated OSA. Sleep apnea causes disruption of sleep and sleep deprivation in most cases, which, in turn, can cause recurrent headaches, problems with memory, mood, concentration, focus, and vigilance. Most people with untreated sleep apnea report excessive daytime sleepiness, which can affect their ability to drive. Please do not drive if you feel sleepy.   I will likely see you back after your sleep study to go over the test results and where to go from there. We will call you after your sleep study to advise about the results (most likely, you will hear from [place], my nurse) and to set up an appointment at the time, as necessary.    Our sleep lab administrative assistant will call you to schedule your sleep study. If  you don't hear back from her by about 2 weeks from now, please feel free to call her at [phone]. You can leave a message with your phone number and concerns, if you get the voicemail box. She will call back as soon as possible.

## 2020-05-21 ENCOUNTER — Encounter: Payer: Self-pay | Admitting: Neurology

## 2020-05-24 ENCOUNTER — Telehealth: Payer: Self-pay

## 2020-05-24 NOTE — Telephone Encounter (Signed)
LVM for pt to call me back to schedule sleep study  

## 2020-06-15 DIAGNOSIS — Z20828 Contact with and (suspected) exposure to other viral communicable diseases: Secondary | ICD-10-CM | POA: Diagnosis not present

## 2020-06-15 DIAGNOSIS — R509 Fever, unspecified: Secondary | ICD-10-CM | POA: Diagnosis not present

## 2020-06-30 DIAGNOSIS — J329 Chronic sinusitis, unspecified: Secondary | ICD-10-CM | POA: Diagnosis not present

## 2020-06-30 DIAGNOSIS — J40 Bronchitis, not specified as acute or chronic: Secondary | ICD-10-CM | POA: Diagnosis not present

## 2020-07-05 ENCOUNTER — Ambulatory Visit (INDEPENDENT_AMBULATORY_CARE_PROVIDER_SITE_OTHER): Payer: PPO | Admitting: Neurology

## 2020-07-05 DIAGNOSIS — R0600 Dyspnea, unspecified: Secondary | ICD-10-CM

## 2020-07-05 DIAGNOSIS — I209 Angina pectoris, unspecified: Secondary | ICD-10-CM

## 2020-07-05 DIAGNOSIS — G4733 Obstructive sleep apnea (adult) (pediatric): Secondary | ICD-10-CM | POA: Diagnosis not present

## 2020-07-05 DIAGNOSIS — I351 Nonrheumatic aortic (valve) insufficiency: Secondary | ICD-10-CM

## 2020-07-05 DIAGNOSIS — R0609 Other forms of dyspnea: Secondary | ICD-10-CM

## 2020-07-06 NOTE — Progress Notes (Signed)
° °

## 2020-07-09 ENCOUNTER — Encounter: Payer: Self-pay | Admitting: Neurology

## 2020-07-09 NOTE — Addendum Note (Signed)
Addended by: Star Age on: 07/09/2020 10:25 AM   Modules accepted: Orders

## 2020-07-09 NOTE — Progress Notes (Signed)
Patient referred by Dr. Einar Gip for reevaluation of her obstructive sleep apnea.  She has been compliant on CPAP for years.  I saw her on 05/20/2020 and she had a home sleep test on 07/05/2020.  Please confirm with her first of all that she did not use her CPAP the night of her home sleep test?  Please advise her that her home sleep test indicated rather mild obstructive sleep apnea with an AHI of 5.4, O2 nadir of 86%.  Given her medical history, ongoing treatment with a machine is recommended, especially since she has done well with her CPAP.  We will order an AutoPap machine.  There may be a delay in getting a new machine as there is a backlog and delay in getting new inventory for most DME companies.  Therefore, she is reminded to continue to be fully compliant with her current machine until she can establish with a new machine.  We will try to keep the pressure setting close to her current set pressure of 9 cm.  She will need a follow-up within 90 days after establishing treatment with new equipment.  Please remind her of this and arrange for follow-up appointment accordingly.  Again, please confirm that she did not use her CPAP at the time of her home sleep test.    Star Age, MD, PhD [facility] ([place])

## 2020-07-09 NOTE — Procedures (Signed)
   [facility] ([facility])  STUDY DATE: 07/05/20  DOB: 03/01/1956  MRN: [id number]  ORDERING CLINICIAN: Star Age, MD, PhD   REFERRING CLINICIAN: Dr. Christen Butter   CLINICAL INFORMATION/HISTORY: 65 year old right-handed woman with an underlying medical history of vitamin D deficiency, thyroid disease, hypothyroidism, hypertension, reflux disease, intermittent, aortic valve insufficiency, with moderate to severe aortic regurgitation, anemia, arthritis, and morbid obesity with a BMI of over 61, who was previously diagnosed with obstructive sleep apnea and placed on CPAP therapy. She has been compliant with her CPAP, but needs re-evaluation and new supplies.   Epworth sleepiness score: 1/24.  BMI: 50.5 kg/m  Neck Circumference: 15"  FINDINGS:   Total Record Time (hours, min): 9 H 48 min  Total Sleep Time (hours, min):  7 H 57 min   Percent REM (%):    20.32 %   Calculated pAHI (per hour): 5.4       REM pAHI: 18.5    NREM pAHI: 3.0 Supine AHI: N/A   Oxygen Saturation (%) Mean: 93  Minimum oxygen saturation (%):        86   O2 Saturation Range (%): 86-98  O2Saturation (minutes) <=88%: 0.3 min   Pulse Mean (bpm):    68  Pulse Range (58-94)   IMPRESSION: OSA (obstructive sleep apnea), mild   RECOMMENDATION:  This home sleep test demonstrates overall mild obstructive sleep apnea with a total AHI of 5.4/hour and O2 nadir of 86% (we will confirm that patient did not use her CPAP during the night of her study). Given the patient's medical history and sleep related complaints, ongoing treatment with positive airway pressure is recommended. The patient should qualify for new equipment.  She will be asked to start AutoPap therapy with a new machine.  While she is waiting for set up with a new machine, she would be advised to continue with her current CPAP machine.  She is currently on 9 cm of pressure.  We will set her AutoPap parameters close to her current  pressure setting as she has done well on it.  Please note that untreated obstructive sleep apnea may carry additional perioperative morbidity. Patients with significant obstructive sleep apnea should receive perioperative PAP therapy and the surgeons and particularly the anesthesiologist should be informed of the diagnosis and the severity of the sleep disordered breathing. The patient should be cautioned not to drive, work at heights, or operate dangerous or heavy equipment when tired or sleepy. Review and reiteration of good sleep hygiene measures should be pursued with any patient. Other causes of the patient's symptoms, including circadian rhythm disturbances, an underlying mood disorder, medication effect and/or an underlying medical problem cannot be ruled out based on this test. Clinical correlation is recommended.   The patient and his referring provider will be notified of the test results. The patient will be seen in follow up in sleep clinic at [facility].  I certify that I have reviewed the raw data recording prior to the issuance of this report in accordance with the standards of the American Academy of Sleep Medicine (AASM).  INTERPRETING PHYSICIAN:  Star Age, MD, PhD  Board Certified in Neurology and Sleep Medicine [facility] [address], [place], [place] [address] [phone]

## 2020-07-09 NOTE — Telephone Encounter (Signed)
From patient.

## 2020-07-12 ENCOUNTER — Telehealth: Payer: Self-pay

## 2020-07-12 NOTE — Telephone Encounter (Signed)
I called pt. I advised pt that Dr. Rexene Alberts reviewed their sleep study results and found that pt has mild osa. Pt confirmed that she was NOT using her cpap during her HST. Dr. Rexene Alberts recommends that pt start an auto pap at home. I advised pt of the backlog on auto-pap devices. I reviewed PAP compliance expectations with the pt. Pt is agreeable to starting an auto-PAP. I advised pt that an order will be sent to a DME, [facility], and [facility] will call the pt within about one week after they file with the pt's insurance. [facility] will show the pt how to use the machine, fit for masks, and troubleshoot the auto-PAP if needed. A follow up appt was made for insurance purposes with Dr. Rexene Alberts on 11/02/2020 at 10:30am. Pt verbalized understanding to arrive 15 minutes early and bring their auto-PAP. A letter with all of this information in it will be mailed to the pt as a reminder. I verified with the pt that the address we have on file is correct. Pt verbalized understanding of results. Pt had no questions at this time but was encouraged to call back if questions arise. I have sent the order to [facility] and have received confirmation that they have received the order.

## 2020-07-12 NOTE — Telephone Encounter (Signed)
-----   Message from Star Age, MD sent at 07/09/2020 10:25 AM EST ----- Patient referred by Dr. Einar Gip for reevaluation of her obstructive sleep apnea.  She has been compliant on CPAP for years.  I saw her on 05/20/2020 and she had a home sleep test on 07/05/2020.  Please confirm with her first of all that she did not use her CPAP the night of her home sleep test?  Please advise her that her home sleep test indicated rather mild obstructive sleep apnea with an AHI of 5.4, O2 nadir of 86%.  Given her medical history, ongoing treatment with a machine is recommended, especially since she has done well with her CPAP.  We will order an AutoPap machine.  There may be a delay in getting a new machine as there is a backlog and delay in getting new inventory for most DME companies.  Therefore, she is reminded to continue to be fully compliant with her current machine until she can establish with a new machine.  We will try to keep the pressure setting close to her current set pressure of 9 cm.  She will need a follow-up within 90 days after establishing treatment with new equipment.  Please remind her of this and arrange for follow-up appointment accordingly.  Again, please confirm that she did not use her CPAP at the time of her home sleep test.    Star Age, MD, PhD [facility] ([place])

## 2020-07-21 ENCOUNTER — Ambulatory Visit: Payer: PPO | Admitting: Cardiology

## 2020-07-21 ENCOUNTER — Encounter: Payer: Self-pay | Admitting: Cardiology

## 2020-07-21 ENCOUNTER — Other Ambulatory Visit: Payer: Self-pay

## 2020-07-21 VITALS — BP 130/60 | HR 82 | Temp 97.0°F | Resp 16 | Ht 67.0 in | Wt 317.6 lb

## 2020-07-21 DIAGNOSIS — I10 Essential (primary) hypertension: Secondary | ICD-10-CM

## 2020-07-21 DIAGNOSIS — I209 Angina pectoris, unspecified: Secondary | ICD-10-CM | POA: Diagnosis not present

## 2020-07-21 DIAGNOSIS — I351 Nonrheumatic aortic (valve) insufficiency: Secondary | ICD-10-CM

## 2020-07-21 DIAGNOSIS — G4733 Obstructive sleep apnea (adult) (pediatric): Secondary | ICD-10-CM

## 2020-07-21 DIAGNOSIS — Z9989 Dependence on other enabling machines and devices: Secondary | ICD-10-CM | POA: Diagnosis not present

## 2020-07-21 NOTE — Progress Notes (Signed)
Primary Physician/Referring:  Ronita Hipps, MD  Patient ID: Joanna Alvarado, female    DOB: 1955-07-09, 65 y.o.   MRN: [id number]  Chief Complaint  Patient presents with  . Aortic Regurgitation  . Chest Pain  . Follow-up    3 month   HPI:    Joanna Alvarado  is a 65 y.o. female with a past medical history of hypertension, controlled diabetes mellitus, hypercholesteremia, morbid obesity with sleep apnea using CPAP at night, GERD, hiatal hernia, fibromyalgia, hypothyroidism and chronic stable angina with normal coronary arteries/minimal coronary artery disease by angiography in April 2021.  She has mild intermittent swelling in bilateral legs, typically after being on her feet. Denies shortness of breath, palpitations, orthopnea.  I seen her 3 months ago for recurrence of angina pectoris, discontinued metoprolol in view of low blood pressure and low heart rate. She now presents for follow-up. No new symptomatology, she does have occasional episodes of angina for which she takes nitroglycerin. No PND or orthopnea or leg edema.  Past Medical History:  Diagnosis Date  . Abnormal myocardial perfusion study 09/26/2019  . Anemia   . Angina pectoris ([place]) - Class II-III 09/26/2019  . Arthritis    KNEES  . Benign neoplasm of ascending colon   . Benign neoplasm of cecum   . Benign neoplasm of descending colon   . Benign neoplasm of sigmoid colon   . Benign neoplasm of transverse colon   . Bowel habit changes   . Cataract    BILATERAL-REMOVED  . Chronic postoperative pain 12/02/2012  . Diabetes mellitus without complication ([place])   . Disturbance of skin sensation 12/02/2012  . Fibromyalgia   . GERD (gastroesophageal reflux disease)   . History of colonic polyps   . Hypertension   . Hypothyroidism   . Myalgia and myositis, unspecified 12/02/2012  . Personal history of colonic polyps   . Primary hypothyroidism 11/30/2015  . Senile nuclear sclerosis 04/24/2014  . Sleep apnea    uses c-pap   . Thyroid disease   . Thyroid nodule 11/30/2015  . Type 2 diabetes mellitus without complications ([place]) 55/73/2202  . Vitamin D deficiency 11/30/2015   Past Surgical History:  Procedure Laterality Date  . ABDOMINAL HYSTERECTOMY    . APPENDECTOMY    . CHOLECYSTECTOMY    . COLONOSCOPY N/A 07/27/2015   Procedure: COLONOSCOPY;  Surgeon: Irene Shipper, MD;  Location: [facility];  Service: Endoscopy;  Laterality: N/A;  . COLONOSCOPY WITH PROPOFOL N/A 04/28/2019   Procedure: COLONOSCOPY WITH PROPOFOL;  Surgeon: Irene Shipper, MD;  Location: [facility];  Service: Endoscopy;  Laterality: N/A;  . EYE SURGERY     bilateral cataracts with lens implants  . floater bone surgery     bone removed from right hand  . FOOT SURGERY     RIGHT  . left foot surgery     removed tendon and placed pins in foot  . LEFT HEART CATH AND CORONARY ANGIOGRAPHY N/A 09/26/2019   Procedure: LEFT HEART CATH AND CORONARY ANGIOGRAPHY;  Surgeon: Leonie Man, MD;  Location: [facility];  Service: Cardiovascular;  Laterality: N/A;  . OTHER SURGICAL HISTORY     Triple orthosesis with tendon release  . PLANTAR FASCIA SURGERY     both feet  . POLYPECTOMY  04/28/2019   Procedure: POLYPECTOMY;  Surgeon: Irene Shipper, MD;  Location: [facility];  Service: Endoscopy;;  . TONSILLECTOMY     Family History  Problem Relation Age  of Onset  . Heart disease Mother   . Diabetes Mother   . Hypertension Mother   . Hypertension Father   . Colon polyps Brother   . Colon polyps Brother   . Stroke Maternal Grandmother   . Heart disease Paternal Grandmother   . Throat cancer Paternal Grandfather   . Colon cancer Neg Hx   . Esophageal cancer Neg Hx   . Rectal cancer Neg Hx   . Stomach cancer Neg Hx     Social History   Tobacco Use  . Smoking status: Never Smoker  . Smokeless tobacco: Never Used  Substance Use Topics  . Alcohol use: Never   Marital Status: Married  ROS  Review of Systems  Constitutional:  Negative for malaise/fatigue.  Cardiovascular: Positive for chest pain and leg swelling (intermittent mild bilateral). Negative for dyspnea on exertion, near-syncope, orthopnea, palpitations and syncope.  Respiratory: Positive for snoring (on cpap).    Objective  Blood pressure 130/60, pulse 82, temperature (!) 97 F (36.1 C), temperature source Temporal, resp. rate 16, height 5\' 7"  (1.702 m), weight (!) 317 lb 9.6 oz (144.1 kg), SpO2 97 %.  Vitals with BMI 07/21/2020 05/20/2020 04/19/2020  Height 5\' 7"  5\' 7"  5\' 7"   Weight 317 lbs 10 oz 322 lbs 328 lbs  BMI 49.73 78.58 85.02  Systolic 774 128 98  Diastolic 60 51 48  Pulse 82 69 83     Physical Exam Constitutional:      Comments: Morbidly obese. No acute distress.  Cardiovascular:     Rate and Rhythm: Normal rate and regular rhythm.     Pulses:          Carotid pulses are 2+ on the right side with bruit and 2+ on the left side with bruit.      Radial pulses are 2+ on the right side and 2+ on the left side.       Dorsalis pedis pulses are 2+ on the right side and 2+ on the left side.       Posterior tibial pulses are 2+ on the right side and 2+ on the left side.     Heart sounds: Murmur heard.  High-pitched blowing decrescendo early diastolic murmur is present with a grade of 2/4 at the upper right sternal border radiating to the apex.     Comments: Popliteal pulses difficult to palpate due to patient body habitus. No leg edema.  No JVD. Pulmonary:     Effort: Pulmonary effort is normal.     Breath sounds: Normal breath sounds.  Abdominal:     General: Bowel sounds are normal.     Palpations: Abdomen is soft.     Comments: Large pannus  Neurological:     General: No focal deficit present.     Mental Status: She is oriented to person, place, and time.    Laboratory examination:   Recent Labs    09/22/19 1440 09/30/19 1041  NA 141 142  K 3.8 4.4  CL 105 108*  CO2 19* 20  GLUCOSE 184* 157*  BUN 14 14  CREATININE 1.03*  1.18*  CALCIUM 10.3 10.7*  GFRNONAA 58* 49*  GFRAA 66 56*   CrCl cannot be calculated (Patient's most recent lab result is older than the maximum 21 days allowed.).  CMP Latest Ref Rng & Units 09/30/2019 09/22/2019 07/04/2019  Glucose 65 - 99 mg/dL 157(H) 184(H) 408(H)  BUN 8 - 27 mg/dL 14 14 15   Creatinine 0.57 - 1.00 mg/dL  1.18(H) 1.03(H) 1.23(H)  Sodium 134 - 144 mmol/L 142 141 135  Potassium 3.5 - 5.2 mmol/L 4.4 3.8 4.4  Chloride 96 - 106 mmol/L 108(H) 105 103  CO2 20 - 29 mmol/L 20 19(L) 23  Calcium 8.7 - 10.3 mg/dL 10.7(H) 10.3 10.1  Total Protein 6.0 - 8.5 g/dL 6.6 - -  Total Bilirubin 0.0 - 1.2 mg/dL 0.7 - -  Alkaline Phos 39 - 117 IU/L 84 - -  AST 0 - 40 IU/L 42(H) - -  ALT 0 - 32 IU/L 28 - -   CBC Latest Ref Rng & Units 09/22/2019 07/04/2019 08/15/2018  WBC 3.4 - 10.8 x10E3/uL 6.3 5.7 -  Hemoglobin 11.1 - 15.9 g/dL 14.5 14.2 13.6  Hematocrit 34.0 - 46.6 % 43.1 42.3 40.0  Platelets 150 - 450 x10E3/uL 134(L) 117(L) -    Lipid Panel Recent Labs    09/30/19 1041  CHOL 119  TRIG 103  LDLCALC 63  HDL 37*  CHOLHDL 3.2   External labs:    Labs 04/01/2020:  A1c 5.5%.  TSH normal.  Free T4 minimally elevated.  Vitamin D normal at 36.  A1C 6.7% 11/2019  Medications and allergies   Allergies  Allergen Reactions  . Metamucil [Psyllium] Other (See Comments)    Severe constipation  . Adhesive [Tape]     causes whelts and blisters  . Hydrocodone-Acetaminophen Itching    Tolerates small/ infrequent doses  . Oxycontin [Oxycodone Hcl]     "FELT LIKE HEAD WAS GOING TO EXPLODE"  . Trulicity [Dulaglutide]     KIDNEY INFECTION,INABILITY TO HAVE BOWEL MOVEMENTS,BLOOD IN URINE  . Ciprofloxacin Rash    Yeast infection and a severe rash   . Codeine Itching and Rash  . Other Palpitations    "makes me feel like I'm having a heart attack. -- Steroids      Outpatient Medications Prior to Visit  Medication Sig Dispense Refill  . acetaminophen (TYLENOL) 500 MG tablet Take  1,000 mg by mouth every 6 (six) hours as needed for moderate pain.    Marland Kitchen aspirin EC 81 MG tablet Take 81 mg by mouth daily.    . budesonide-formoterol (SYMBICORT) 80-4.5 MCG/ACT inhaler Inhale 2 puffs into the lungs 2 (two) times daily as needed (shortness of breath).     . Cholecalciferol (VITAMIN D3) 50 MCG (2000 UT) CAPS Take 2,000 Units by mouth daily.    . diclofenac (CATAFLAM) 50 MG tablet Take 50 mg by mouth daily.     . hydrochlorothiazide (MICROZIDE) 12.5 MG capsule Take 1 capsule (12.5 mg total) by mouth daily. 90 capsule 3  . isosorbide mononitrate (IMDUR) 120 MG 24 hr tablet Take 1 tablet (120 mg total) by mouth daily. 90 tablet 3  . levothyroxine (SYNTHROID, LEVOTHROID) 175 MCG tablet Take 175-262.5 mcg by mouth See admin instructions. Take 262.5 mcg  on Monday and Friday Tale 175 mcg on Tuesday, Wednesday, Thursday, Saturday, and Sunday    . linaclotide (LINZESS) 290 MCG CAPS capsule Take 290 mcg by mouth daily.    Marland Kitchen losartan (COZAAR) 50 MG tablet Take 50 mg by mouth daily.    . metFORMIN (GLUCOPHAGE-XR) 500 MG 24 hr tablet Take 1,000 mg by mouth 2 (two) times daily.    . nitroGLYCERIN (NITROSTAT) 0.4 MG SL tablet Place 1 tablet (0.4 mg total) under the tongue every 5 (five) minutes as needed for chest pain. 90 tablet 3  . nystatin cream (MYCOSTATIN) Apply 1 application topically See admin instructions. Apply once daily  may apply a second time as needed for rash    . omeprazole (PRILOSEC) 40 MG capsule Take 40 mg by mouth daily.    . rosuvastatin (CRESTOR) 5 MG tablet Take 1 tablet (5 mg total) by mouth daily. 90 tablet 3  . Semaglutide (OZEMPIC, 0.25 OR 0.5 MG/DOSE, [place]) Inject 0.25 mg into the skin every Sunday.     . ergocalciferol (VITAMIN D2) 1.25 MG (50000 UT) capsule Take 50,000 Units by mouth every Saturday.      No facility-administered medications prior to visit.    Radiology:   Chest X-ray 2 View 07/04/2019 Faint opacity within the peripheral aspect of the left lower  lobe could reflect a small developing infiltrate. Lungs are otherwise clear.  Cardiac Studies:   ZIO Monitor 7 days 09/17/2019 ZIO monitor was performed for 7 days and 2 hours beginning 08/27/2019 to evaluate palpitation. The rhythm throughout was sinus with minimum average and maximum heart rates of 50, 73 and 157 bpm. There were no pauses of 3 seconds or greater and there were no episodes of second or third-degree AV block or sinus node exit block. There where 5 triggered and 4 diary events predominantly seen with sinus rhythm although at times there were atrial or ventricular premature contractions. Ventricular ectopy was rare with PVCs and couplets.  There was one 7 beat run of PVCs present. Supraventricular ectopy was rare there were no episodes of atrial fibrillation or flutter.  There was one brief atrial tachycardias seen 9 complexes at a rate of 111 bpm  Conclusion, rare ventricular and supraventricular ectopy however there is one brief run of PVCs and one short episode of atrial tachycardia.  The diary and triggered events were predominantly sinus rhythm.  Myocardial Perfusion w/ Lexiscan Stress Test 09/17/2019  Nuclear stress EF: 61%.  The left ventricular ejection fraction is normal (55-65%).  No T wave inversion was noted during stress.  There was no ST segment deviation noted during stress.  Defect 1: There is a medium defect of moderate severity present in the basal inferior and mid inferior location. There is mild hypokinesis of the basal-mid inferior wall.  Findings consistent with prior myocardial infarction with no evidence of peri-infarct ischemia.  This is a low risk study.  Left Heart Catheterization 09/26/2019 The left ventricular systolic function is normal. LV end diastolic pressure is normal. There is no aortic valve stenosis. Prox RCA to Mid RCA lesion is 10% stenosed. 1st Diag lesion is 55% stenosed. Mid LAD lesion is 10-20% stenosed. Angiographically  normal coronary arteries with minimal CAD.  No obvious culprit lesion to explain symptoms or stress test results.  False Positive Nuclear Stress Test -> however cannot exclude possibility of microvascular disease. Normal LV function and EDP.  Carotid artery duplex 03/10/2020: The bifurcation, internal, external and common carotid arteries reveal no evidence of significant stenosis, bilaterally. Antegrade right vertebral artery flow. Antegrade left vertebral artery flow.  Echocardiogram 03/11/2020: Left ventricle cavity is normal in size. Moderate concentric hypertrophy of the left ventricle. Normal global wall motion. Normal LV systolic function with visual EF 50-55%. Indeterminate diastolic filling pattern. The aortic root is dilated, measuring 4.0 cm at sinotubular junction.  Left atrial cavity is mildly dilated. Trileaflet aortic valve.  Moderate to severe aortic regurgitation. Mild to moderate mitral regurgitation. IVC is dilated with blunted respiratory response. Estimated RA pressure 10-15 mmHg.  EKG:     EKG 03/05/2020: Normal sinus rhythm with rate of 69 bpm, left axis deviation, left intrafascicular block.  Incomplete right bundle branch block. Poor R wave progression, cannot exclude anteroanteroseptal infarct old.  Assessment     ICD-10-CM   1. Angina pectoris with normal coronary arteriogram (HCC)  I20.9 PCV ECHOCARDIOGRAM COMPLETE  2. Moderate to severe aortic valve regurgitation  I35.1 PCV ECHOCARDIOGRAM COMPLETE  3. Primary hypertension  I10   4. OSA on CPAP  G47.33    Z99.89    Medications Discontinued During This Encounter  Medication Reason  . ergocalciferol (VITAMIN D2) 1.25 MG (50000 UT) capsule Change in therapy    No orders of the defined types were placed in this encounter.  Orders Placed This Encounter  Procedures  . PCV ECHOCARDIOGRAM COMPLETE    Standing Status:   Future    Standing Expiration Date:   07/21/2021    Recommendations:   MARK BENECKE  is a 65 y.o. female with a past medical history of hypertension, controlled diabetes mellitus, hypercholesteremia, morbid obesity with obstructive sleep apnea using CPAP at night, GERD, hiatal hernia, fibromyalgia, hypothyroidism and chronic stable angina with normal coronary arteries/minimal coronary artery disease by angiography in April 2021.  I suspect that her angina pectoris is related to moderate to severe aortic regurgitation. Suspect aortic root dilatation is related to moderately severe AI. I will repeat echocardiogram in 6 months. I do not think she needs aortic valve surgery at this moment. Also since discontinuing beta-blocker therapy, heart rate has improved. I am very pleased with her progress. She is also making excellent decisions and has made excellent progress with regard to weight loss as well. I have advised no restriction in physical activity.  With regard to sleep apnea, she has now established with Dr. Rexene Alberts and she is awaiting a new machine being delivered to her. She has been compliant with the CPAP. Blood pressure is well controlled. There is no clinical evidence of heart failure. I will see her back in 6 months. Anginal symptoms are essentially stable.   Adrian Prows, MD, [facility] 07/21/2020, 11:09 AM Office: [phone]

## 2020-07-29 DIAGNOSIS — E039 Hypothyroidism, unspecified: Secondary | ICD-10-CM | POA: Diagnosis not present

## 2020-07-29 DIAGNOSIS — E119 Type 2 diabetes mellitus without complications: Secondary | ICD-10-CM | POA: Diagnosis not present

## 2020-07-29 DIAGNOSIS — E559 Vitamin D deficiency, unspecified: Secondary | ICD-10-CM | POA: Diagnosis not present

## 2020-07-29 DIAGNOSIS — E041 Nontoxic single thyroid nodule: Secondary | ICD-10-CM | POA: Diagnosis not present

## 2020-08-30 ENCOUNTER — Telehealth: Payer: Self-pay | Admitting: Internal Medicine

## 2020-08-30 NOTE — Telephone Encounter (Signed)
Patient called state she has been having a lot of BM issues taking two capfuls of the Miralax, two stool softeners with laxative and Linzess daily. If she lays on her side she releases gas otherwise she can even do that and is wondering if something is stuck down there.

## 2020-08-31 NOTE — Telephone Encounter (Signed)
Patient reports difficulty passing gas or having a BM while sitting.  She can only pass gas if she is on her side.  She will come in and see Ellouise Newer, [place] on 08/09/20

## 2020-09-09 ENCOUNTER — Encounter: Payer: Self-pay | Admitting: Physician Assistant

## 2020-09-09 ENCOUNTER — Ambulatory Visit: Payer: HMO | Admitting: Physician Assistant

## 2020-09-09 VITALS — BP 120/48 | HR 68 | Ht 67.0 in | Wt 323.6 lb

## 2020-09-09 DIAGNOSIS — K5900 Constipation, unspecified: Secondary | ICD-10-CM

## 2020-09-09 MED ORDER — LUBIPROSTONE 24 MCG PO CAPS: 24.0000 ug | ORAL_CAPSULE | Freq: Two times a day (BID) | ORAL | 5 refills | Status: DC

## 2020-09-09 NOTE — Progress Notes (Signed)
Chief Complaint: Constipation  HPI:    Joanna Alvarado is a 65 year old female with a past medical history as listed below including morbid obesity, fibromyalgia, diabetes and sleep apnea, known to Dr. Henrene Pastor for her history of colon polyps, who was referred to me by Ronita Hipps, MD for a complaint of constipation.    07/23/2018 patient seen in clinic by Dr. Henrene Pastor for left lower quadrant pain and significant constipation.  He described a history of multiple adenomatous colon polyps with previous colonoscopy 2003, 2016 and 2011 and most recently February 2017.  At that time thought worsening constipation was due to a change to a keto diet.  She was scheduled for a CT scan of her abdomen pelvis given left lower quadrant pain.  Told to take MiraLAX daily.    08/15/2018 CT abdomen pelvis with contrast showed bilateral lower lobe bronchial wall thickening with peribronchovascular airspace opacities consistent with pneumonia, no acute findings in the abdomen or pelvis.  Chronic changes including 2 small peripherally calcified benign-appearing liver lesions.  Status post hysterectomy and cholecystectomy.    04/27/2021 colonoscopy with 7 3-5 mm polyps and diverticulosis.  Pathology showed tubular adenomas.  Also sessile serrated adenoma.  Repeat recommended in 3 years.    Today, the patient tells me that after seeing Dr. Henrene Pastor as above she had done fairly well with constipation until about 3 to 4 months ago.  Apparently her cardiologist put her on a "strict diet", and this included starting her back on Metamucil.  Tells me that previous to this Dr. Henrene Pastor had stopped this because it caused her constipation.  Tells me that she would go 14 days before a bowel movement so she started taking MiraLAX every day and then would go only 9 days in between a bowel movement but even when she would have one she would strain and it would feel like she was "throwing poop out of my throat".  Now she uses 2 capfuls of MiraLAX and 2  stool softeners with a laxative as well as Linzess 290 mcg every 3 days (does not use daily due to cost of over $400) and tells me that she will have a bowel movement once every 3 days or so, lately tells me she has no feeling for a bowel movement and may just pass stool and typically it is either watery or just some mush, but typically small amounts.  Tells me she cannot pass gas unless laying on her side.      Denies nausea, vomiting, significant abdominal pain, blood in her stool or symptoms that awaken her from sleep.  Past Medical History:  Diagnosis Date  . Abnormal myocardial perfusion study 09/26/2019  . Anemia   . Angina pectoris ([place]) - Class II-III 09/26/2019  . Arthritis    KNEES  . Benign neoplasm of ascending colon   . Benign neoplasm of cecum   . Benign neoplasm of descending colon   . Benign neoplasm of sigmoid colon   . Benign neoplasm of transverse colon   . Bowel habit changes   . Cataract    BILATERAL-REMOVED  . Chronic postoperative pain 12/02/2012  . Diabetes mellitus without complication ([place])   . Disturbance of skin sensation 12/02/2012  . Fibromyalgia   . GERD (gastroesophageal reflux disease)   . History of colonic polyps   . Hypertension   . Hypothyroidism   . Myalgia and myositis, unspecified 12/02/2012  . Personal history of colonic polyps   . Primary hypothyroidism 11/30/2015  .  Senile nuclear sclerosis 04/24/2014  . Sleep apnea    uses c-pap  . Thyroid disease   . Thyroid nodule 11/30/2015  . Type 2 diabetes mellitus without complications ([place]) 16/03/9603  . Vitamin D deficiency 11/30/2015    Past Surgical History:  Procedure Laterality Date  . ABDOMINAL HYSTERECTOMY    . APPENDECTOMY    . CHOLECYSTECTOMY    . COLONOSCOPY N/A 07/27/2015   Procedure: COLONOSCOPY;  Surgeon: Irene Shipper, MD;  Location: [facility];  Service: Endoscopy;  Laterality: N/A;  . COLONOSCOPY WITH PROPOFOL N/A 04/28/2019   Procedure: COLONOSCOPY WITH PROPOFOL;  Surgeon:  Irene Shipper, MD;  Location: [facility];  Service: Endoscopy;  Laterality: N/A;  . EYE SURGERY     bilateral cataracts with lens implants  . floater bone surgery     bone removed from right hand  . FOOT SURGERY     RIGHT  . FOOT SURGERY Left    triple orthodesis  . left foot surgery     removed tendon and placed pins in foot  . LEFT HEART CATH AND CORONARY ANGIOGRAPHY N/A 09/26/2019   Procedure: LEFT HEART CATH AND CORONARY ANGIOGRAPHY;  Surgeon: Leonie Man, MD;  Location: [facility];  Service: Cardiovascular;  Laterality: N/A;  . PLANTAR FASCIA SURGERY     both feet  . POLYPECTOMY  04/28/2019   Procedure: POLYPECTOMY;  Surgeon: Irene Shipper, MD;  Location: [facility];  Service: Endoscopy;;  . TONSILLECTOMY      Current Outpatient Medications  Medication Sig Dispense Refill  . acetaminophen (TYLENOL) 500 MG tablet Take 1,000 mg by mouth every 6 (six) hours as needed for moderate pain.    Marland Kitchen aspirin EC 81 MG tablet Take 81 mg by mouth daily.    . budesonide-formoterol (SYMBICORT) 80-4.5 MCG/ACT inhaler Inhale 2 puffs into the lungs 2 (two) times daily as needed (shortness of breath).     . Cholecalciferol (VITAMIN D3) 50 MCG (2000 UT) CAPS Take 2,000 Units by mouth daily.    . diclofenac (CATAFLAM) 50 MG tablet Take 50 mg by mouth daily.     . hydrochlorothiazide (MICROZIDE) 12.5 MG capsule Take 1 capsule (12.5 mg total) by mouth daily. 90 capsule 3  . isosorbide mononitrate (IMDUR) 120 MG 24 hr tablet Take 1 tablet (120 mg total) by mouth daily. 90 tablet 3  . levothyroxine (SYNTHROID, LEVOTHROID) 175 MCG tablet Take 175-262.5 mcg by mouth See admin instructions. Take 262.5 mcg  on Monday and Friday Tale 175 mcg on Tuesday, Wednesday, Thursday, Saturday, and Sunday    . linaclotide (LINZESS) 290 MCG CAPS capsule Take 290 mcg by mouth as needed (constipation). Taking every 3rd day    . losartan (COZAAR) 50 MG tablet Take 50 mg by mouth daily.    Marland Kitchen lubiprostone  (AMITIZA) 24 MCG capsule Take 1 capsule (24 mcg total) by mouth 2 (two) times daily with a meal. 60 capsule 5  . metFORMIN (GLUCOPHAGE-XR) 500 MG 24 hr tablet Take 1,000 mg by mouth 2 (two) times daily.    Marland Kitchen nystatin cream (MYCOSTATIN) Apply 1 application topically See admin instructions. Apply once daily may apply a second time as needed for rash    . omeprazole (PRILOSEC) 40 MG capsule Take 40 mg by mouth daily.    . rosuvastatin (CRESTOR) 5 MG tablet Take 1 tablet (5 mg total) by mouth daily. 90 tablet 3  . Semaglutide (OZEMPIC, 0.25 OR 0.5 MG/DOSE, [place]) Inject 0.25 mg into the skin  every Sunday.     . nitroGLYCERIN (NITROSTAT) 0.4 MG SL tablet Place 1 tablet (0.4 mg total) under the tongue every 5 (five) minutes as needed for chest pain. 90 tablet 3   No current facility-administered medications for this visit.    Allergies as of 09/09/2020 - Review Complete 09/09/2020  Allergen Reaction Noted  . Metamucil [psyllium] Other (See Comments) 07/21/2020  . Adhesive [tape]  09/23/2019  . Hydrocodone-acetaminophen Itching 12/02/2012  . Oxycontin [oxycodone hcl]  04/23/2019  . Trulicity [dulaglutide]  04/23/2019  . Ciprofloxacin Rash 09/23/2019  . Codeine Itching and Rash   . Other Palpitations 07/13/2015    Family History  Problem Relation Age of Onset  . Heart disease Mother   . Diabetes Mother   . Hypertension Mother   . Hypertension Father   . Colon polyps Brother   . Stroke Maternal Grandmother   . Heart disease Paternal Grandmother   . Throat cancer Paternal Grandfather   . Colon cancer Neg Hx   . Esophageal cancer Neg Hx   . Rectal cancer Neg Hx   . Stomach cancer Neg Hx     Social History   Socioeconomic History  . Marital status: Married    Spouse name: Not on file  . Number of children: 2  . Years of education: Not on file  . Highest education level: Some college, no degree  Occupational History    Comment: retired  Tobacco Use  . Smoking status: Never Smoker   . Smokeless tobacco: Never Used  Vaping Use  . Vaping Use: Never used  Substance and Sexual Activity  . Alcohol use: Never  . Drug use: Never  . Sexual activity: Not on file  Other Topics Concern  . Not on file  Social History Narrative   Lives with husband   caffeine 1 c daily   Social Determinants of Health   Financial Resource Strain: Not on file  Food Insecurity: Not on file  Transportation Needs: Not on file  Physical Activity: Not on file  Stress: Not on file  Social Connections: Not on file  Intimate Partner Violence: Not on file    Review of Systems:    Constitutional: No weight loss, fever or chills Cardiovascular: No chest pain   Respiratory: No SOB  Gastrointestinal: See HPI and otherwise negative   Physical Exam:  Vital signs: BP (!) 120/48   Pulse 68   Ht 5\' 7"  (1.702 m)   Wt (!) 323 lb 9.6 oz (146.8 kg)   BMI 50.68 kg/m   Constitutional:   Pleasant morbidly obese Caucasian female appears to be in NAD, Well developed, Well nourished, alert and cooperative Respiratory: Respirations even and unlabored. Lungs clear to auscultation bilaterally.   No wheezes, crackles, or rhonchi.  Cardiovascular: Normal S1, S2. No MRG. Regular rate and rhythm. No peripheral edema, cyanosis or pallor.  Gastrointestinal:  Soft, nondistended, nontender. No rebound or guarding. Normal bowel sounds. No appreciable masses or hepatomegaly. Rectal:  Not performed.  Psychiatric: Demonstrates good judgement and reason without abnormal affect or behaviors.  No recent labs or imaging.  Assessment: 1.  Constipation: Worsened over the past 3 to 4 months, no relief with 2 capfuls of MiraLAX, 2 stool softeners a laxative and Linzess 290 mcg every 3 days  Plan: 1.  We will have the patient do a bowel purge with MiraLAX.  Provided her with instructions on this. 2.  Started the patient on Amitiza 24 mcg twice daily with meals.  Prescribed #  60 with 5 refills.  Also provided the patient  with samples.  Recommend the patient call me if this is too expensive for her.  When looking in good Rx it looks like this will be at least 1/2 the price of the Linzess.  Also told her we could try to get a prior Auth given that she has tried some any other laxatives including MiraLAX, stool softeners, Senokot, Linzess etc. 3.  Patient will follow with me in 2 to 3 months.  Instructed her to call if the above was not working before then.  Joanna Newer, PA-C [facility] 09/09/2020, 2:52 PM  Cc: Ronita Hipps, MD

## 2020-09-09 NOTE — Patient Instructions (Signed)
If you are age 66 or older, your body mass index should be between 23-30. Your Body mass index is 50.68 kg/m. If this is out of the aforementioned range listed, please consider follow up with your Primary Care Provider.  If you are age 53 or younger, your body mass index should be between 19-25. Your Body mass index is 50.68 kg/m. If this is out of the aformentioned range listed, please consider follow up with your Primary Care Provider.   We have sent the following medications to your pharmacy for you to pick up at your convenience: amitiza 24 mcg  Ellouise Newer  recommends that you complete a bowel purge (to clean out your bowels). Please do the following: Purchase a bottle of Miralax over the counter as well as a box of 5 mg dulcolax tablets. Take 4 dulcolax tablets. Wait 1 hour. You will then drink 6-8 capfuls of Miralax mixed in an adequate amount of water/juice/gatorade (you may choose which of these liquids to drink) over the next 2-3 hours. You should expect results within 1 to 6 hours after completing the bowel purge.  Thank you for choosing me and [facility].  Ellouise Newer, PA-C

## 2020-09-10 NOTE — Progress Notes (Signed)
Noted  

## 2020-09-14 DIAGNOSIS — L03039 Cellulitis of unspecified toe: Secondary | ICD-10-CM | POA: Diagnosis not present

## 2020-09-14 DIAGNOSIS — Z6841 Body Mass Index (BMI) 40.0 and over, adult: Secondary | ICD-10-CM | POA: Diagnosis not present

## 2020-09-30 ENCOUNTER — Other Ambulatory Visit: Payer: Self-pay

## 2020-09-30 ENCOUNTER — Ambulatory Visit: Payer: HMO | Admitting: Podiatry

## 2020-09-30 ENCOUNTER — Other Ambulatory Visit: Payer: Self-pay | Admitting: *Deleted

## 2020-09-30 ENCOUNTER — Encounter: Payer: Self-pay | Admitting: Podiatry

## 2020-09-30 DIAGNOSIS — M79676 Pain in unspecified toe(s): Secondary | ICD-10-CM

## 2020-09-30 DIAGNOSIS — L600 Ingrowing nail: Secondary | ICD-10-CM

## 2020-09-30 NOTE — Progress Notes (Signed)
  Subjective:  Patient ID: Joanna Alvarado, female    DOB: 04-03-1956,  MRN: [id number]  Chief Complaint  Patient presents with  . Nail Problem    I have some darkness on the left big toenail and I have had surgery on the left big toe    65 y.o. female presents with the above complaint. History confirmed with patient.Unsure how this started. Attempted to bore a hole in the nail with release of some blood.  Objective:  Physical Exam: warm, good capillary refill, no trophic changes or ulcerative lesions, normal DP and PT pulses and normal sensory exam.  Painful loose nail proximal aspect of left hallux without warmth, erythema, signs of infection. Subungual hemorrhage over almost entire nail.  Assessment:   1. Ingrown nail   2. Pain around toenail    Plan:  Patient was evaluated and treated and all questions answered.  Ingrown Nail, left  -Discussed proceeding with avulsion today because of proximal lysis.  Procedure: Avulsion of toenail Location: Left 1st toe  Anesthesia: Lidocaine 1% plain; 1.5 mL and Marcaine 0.5% plain; 1.5 mL, digital block. Skin Prep: Betadine. Dressing: Silvadene; telfa; dry, sterile, compression dressing. Technique: Following skin prep, the toe was exsanguinated and a tourniquet was secured at the base of the toe. The nail was freed and avulsed with a hemostat. The area was cleansed. The tourniquet was then removed and sterile dressing applied. Disposition: Patient tolerated procedure well.   Return if symptoms worsen or fail to improve.

## 2020-10-12 DIAGNOSIS — Z1331 Encounter for screening for depression: Secondary | ICD-10-CM | POA: Diagnosis not present

## 2020-10-12 DIAGNOSIS — I10 Essential (primary) hypertension: Secondary | ICD-10-CM | POA: Diagnosis not present

## 2020-10-12 DIAGNOSIS — Z0000 Encounter for general adult medical examination without abnormal findings: Secondary | ICD-10-CM | POA: Diagnosis not present

## 2020-10-12 DIAGNOSIS — Z6841 Body Mass Index (BMI) 40.0 and over, adult: Secondary | ICD-10-CM | POA: Diagnosis not present

## 2020-10-12 DIAGNOSIS — M8580 Other specified disorders of bone density and structure, unspecified site: Secondary | ICD-10-CM | POA: Diagnosis not present

## 2020-10-15 DIAGNOSIS — Z6841 Body Mass Index (BMI) 40.0 and over, adult: Secondary | ICD-10-CM | POA: Diagnosis not present

## 2020-10-15 DIAGNOSIS — M1711 Unilateral primary osteoarthritis, right knee: Secondary | ICD-10-CM | POA: Diagnosis not present

## 2020-10-15 DIAGNOSIS — M25551 Pain in right hip: Secondary | ICD-10-CM | POA: Diagnosis not present

## 2020-10-26 DIAGNOSIS — Z1211 Encounter for screening for malignant neoplasm of colon: Secondary | ICD-10-CM | POA: Diagnosis not present

## 2020-11-02 ENCOUNTER — Ambulatory Visit: Payer: PPO | Admitting: Neurology

## 2020-11-30 DIAGNOSIS — Z7984 Long term (current) use of oral hypoglycemic drugs: Secondary | ICD-10-CM | POA: Diagnosis not present

## 2020-11-30 DIAGNOSIS — E041 Nontoxic single thyroid nodule: Secondary | ICD-10-CM | POA: Diagnosis not present

## 2020-11-30 DIAGNOSIS — E119 Type 2 diabetes mellitus without complications: Secondary | ICD-10-CM | POA: Diagnosis not present

## 2020-11-30 DIAGNOSIS — E039 Hypothyroidism, unspecified: Secondary | ICD-10-CM | POA: Diagnosis not present

## 2020-11-30 DIAGNOSIS — E559 Vitamin D deficiency, unspecified: Secondary | ICD-10-CM | POA: Diagnosis not present

## 2021-01-10 ENCOUNTER — Ambulatory Visit: Payer: HMO

## 2021-01-10 ENCOUNTER — Other Ambulatory Visit: Payer: Self-pay

## 2021-01-10 DIAGNOSIS — I351 Nonrheumatic aortic (valve) insufficiency: Secondary | ICD-10-CM

## 2021-01-10 DIAGNOSIS — I209 Angina pectoris, unspecified: Secondary | ICD-10-CM | POA: Diagnosis not present

## 2021-01-20 ENCOUNTER — Other Ambulatory Visit: Payer: Self-pay

## 2021-01-20 ENCOUNTER — Ambulatory Visit: Payer: PPO | Admitting: Cardiology

## 2021-01-20 ENCOUNTER — Encounter: Payer: Self-pay | Admitting: Cardiology

## 2021-01-20 VITALS — BP 113/63 | HR 76 | Temp 98.0°F | Resp 17 | Ht 67.0 in | Wt 304.8 lb

## 2021-01-20 DIAGNOSIS — I351 Nonrheumatic aortic (valve) insufficiency: Secondary | ICD-10-CM

## 2021-01-20 DIAGNOSIS — I77810 Thoracic aortic ectasia: Secondary | ICD-10-CM | POA: Diagnosis not present

## 2021-01-20 DIAGNOSIS — R739 Hyperglycemia, unspecified: Secondary | ICD-10-CM

## 2021-01-20 DIAGNOSIS — I209 Angina pectoris, unspecified: Secondary | ICD-10-CM

## 2021-01-20 DIAGNOSIS — I10 Essential (primary) hypertension: Secondary | ICD-10-CM | POA: Diagnosis not present

## 2021-01-20 MED ORDER — ISOSORBIDE MONONITRATE ER 120 MG PO TB24: 120.0000 mg | ORAL_TABLET | Freq: Every day | ORAL | 3 refills | Status: DC

## 2021-01-20 NOTE — Progress Notes (Signed)
Primary Physician/Referring:  Ronita Hipps, MD  Patient ID: Joanna Alvarado, female    DOB: 03-22-1956, 65 y.o.   MRN: [id number]  Chief Complaint  Patient presents with   Follow-up   aortic regurgitation   Chest Pain   Hypertension   HPI:    Joanna Alvarado  is a 65 y.o. female with a past medical history of hypertension, controlled diabetes mellitus, now only has hyperglycemia with weight loss hypercholesteremia, morbid obesity with sleep apnea using CPAP at night, GERD, hiatal hernia, fibromyalgia, hypothyroidism and chronic stable angina with normal coronary arteries/minimal coronary artery disease by angiography in April 2021.  She presents for follow-up of angina pectoris.  She has noticed worsening dyspnea on exertion and also more frequent episodes of angina since last office visit 6 months ago.  No PND or orthopnea, no leg edema. She also has degenerative joint disease, she is awaiting surgical evaluation for right hip replacement and right knee replacement as well. Past Medical History:  Diagnosis Date   Abnormal myocardial perfusion study 09/26/2019   Anemia    Angina pectoris ([place]) - Class II-III 09/26/2019   Arthritis    KNEES   Benign neoplasm of ascending colon    Benign neoplasm of cecum    Benign neoplasm of descending colon    Benign neoplasm of sigmoid colon    Benign neoplasm of transverse colon    Bowel habit changes    Cataract    BILATERAL-REMOVED   Chronic postoperative pain 12/02/2012   Diabetes mellitus without complication ([place])    Disturbance of skin sensation 12/02/2012   Fibromyalgia    GERD (gastroesophageal reflux disease)    History of colonic polyps    Hypertension    Hypothyroidism    Myalgia and myositis, unspecified 12/02/2012   Personal history of colonic polyps    Primary hypothyroidism 11/30/2015   Senile nuclear sclerosis 04/24/2014   Sleep apnea    uses c-pap   Thyroid disease    Thyroid nodule 11/30/2015   Type 2 diabetes  mellitus without complications ([place]) [id number]   Vitamin D deficiency 11/30/2015   Past Surgical History:  Procedure Laterality Date   ABDOMINAL HYSTERECTOMY     APPENDECTOMY     CHOLECYSTECTOMY     COLONOSCOPY N/A 07/27/2015   Procedure: COLONOSCOPY;  Surgeon: Irene Shipper, MD;  Location: [facility];  Service: Endoscopy;  Laterality: N/A;   COLONOSCOPY WITH PROPOFOL N/A 04/28/2019   Procedure: COLONOSCOPY WITH PROPOFOL;  Surgeon: Irene Shipper, MD;  Location: [facility];  Service: Endoscopy;  Laterality: N/A;   EYE SURGERY     bilateral cataracts with lens implants   floater bone surgery     bone removed from right hand   FOOT SURGERY     RIGHT   FOOT SURGERY Left    triple orthodesis   left foot surgery     removed tendon and placed pins in foot   LEFT HEART CATH AND CORONARY ANGIOGRAPHY N/A 09/26/2019   Procedure: LEFT HEART CATH AND CORONARY ANGIOGRAPHY;  Surgeon: Leonie Man, MD;  Location: [facility];  Service: Cardiovascular;  Laterality: N/A;   PLANTAR FASCIA SURGERY     both feet   POLYPECTOMY  04/28/2019   Procedure: POLYPECTOMY;  Surgeon: Irene Shipper, MD;  Location: [facility];  Service: Endoscopy;;   TONSILLECTOMY     Family History  Problem Relation Age of Onset   Heart disease Mother 42   Diabetes Mother  Hypertension Mother    Hypertension Father    Colon polyps Brother    Stroke Maternal Grandmother    Heart disease Paternal Grandmother    Throat cancer Paternal Grandfather    Colon cancer Neg Hx    Esophageal cancer Neg Hx    Rectal cancer Neg Hx    Stomach cancer Neg Hx     Social History   Tobacco Use   Smoking status: Never   Smokeless tobacco: Never  Substance Use Topics   Alcohol use: Never   Marital Status: Married  ROS  Review of Systems  Constitutional: Negative for malaise/fatigue.  Cardiovascular:  Positive for chest pain and dyspnea on exertion. Negative for leg swelling, near-syncope, orthopnea, palpitations  and syncope.  Respiratory:  Positive for snoring (on cpap).   Musculoskeletal:  Positive for arthritis and back pain.  Objective  Blood pressure 113/63, pulse 76, temperature 98 F (36.7 C), temperature source Temporal, resp. rate 17, height '5\' 7"'$  (1.702 m), weight (!) 304 lb 12.8 oz (138.3 kg), SpO2 98 %.  Vitals with BMI 01/20/2021 09/09/2020 07/21/2020  Height '5\' 7"'$  '5\' 7"'$  '5\' 7"'$   Weight 304 lbs 13 oz 323 lbs 10 oz 317 lbs 10 oz  BMI 47.73 123456 [id number]  Systolic 123456 [id number]  Diastolic 63 48 60  Pulse 76 68 82     Physical Exam Constitutional:      Comments: Morbidly obese. No acute distress.  Cardiovascular:     Rate and Rhythm: Normal rate and regular rhythm.     Pulses:          Carotid pulses are 2+ on the right side with bruit and 2+ on the left side with bruit.      Radial pulses are 2+ on the right side and 2+ on the left side.       Dorsalis pedis pulses are 2+ on the right side and 2+ on the left side.       Posterior tibial pulses are 2+ on the right side and 2+ on the left side.     Heart sounds: Murmur heard.  High-pitched blowing decrescendo early diastolic murmur is present with a grade of 2/4 at the upper right sternal border radiating to the apex.     Comments: Popliteal pulses difficult to palpate due to patient body habitus. No leg edema.  No JVD. Pulmonary:     Effort: Pulmonary effort is normal.     Breath sounds: Normal breath sounds.  Abdominal:     General: Bowel sounds are normal.     Palpations: Abdomen is soft.     Comments: Large pannus  Neurological:     General: No focal deficit present.     Mental Status: She is oriented to person, place, and time.   Laboratory examination:   No results for input(s): NA, K, CL, CO2, GLUCOSE, BUN, CREATININE, CALCIUM, GFRNONAA, GFRAA in the last 8760 hours.  CrCl cannot be calculated (Patient's most recent lab result is older than the maximum 21 days allowed.).  CMP Latest Ref Rng & Units 09/30/2019 09/22/2019  07/04/2019  Glucose 65 - 99 mg/dL 157(H) 184(H) 408(H)  BUN 8 - 27 mg/dL '14 14 15  '$ Creatinine 0.57 - 1.00 mg/dL 1.18(H) 1.03(H) 1.23(H)  Sodium 134 - 144 mmol/L 142 141 135  Potassium 3.5 - 5.2 mmol/L 4.4 3.8 4.4  Chloride 96 - 106 mmol/L 108(H) 105 103  CO2 20 - 29 mmol/L 20 19(L) 23  Calcium 8.7 - 10.3 mg/dL  10.7(H) 10.3 10.1  Total Protein 6.0 - 8.5 g/dL 6.6 - -  Total Bilirubin 0.0 - 1.2 mg/dL 0.7 - -  Alkaline Phos 39 - 117 IU/L 84 - -  AST 0 - 40 IU/L 42(H) - -  ALT 0 - 32 IU/L 28 - -   CBC Latest Ref Rng & Units 09/22/2019 07/04/2019 08/15/2018  WBC 3.4 - 10.8 x10E3/uL 6.3 5.7 -  Hemoglobin 11.1 - 15.9 g/dL 14.5 14.2 13.6  Hematocrit 34.0 - 46.6 % 43.1 42.3 40.0  Platelets 150 - 450 x10E3/uL 134(L) 117(L) -    Lipid Panel No results for input(s): CHOL, TRIG, LDLCALC, VLDL, HDL, CHOLHDL, LDLDIRECT in the last 8760 hours.  External labs:    Labs 04/01/2020:  A1c 5.5%.  TSH normal.  Free T4 minimally elevated.  Vitamin D normal at 36.  A1C 6.7% 11/2019  Medications and allergies   Allergies  Allergen Reactions   Metamucil [Psyllium] Other (See Comments)    Severe constipation   Adhesive [Tape]     causes whelts and blisters   Hydrocodone-Acetaminophen Itching    Tolerates small/ infrequent doses   Oxycontin [Oxycodone Hcl]     "FELT LIKE HEAD WAS GOING TO EXPLODE"   Trulicity [Dulaglutide]     KIDNEY INFECTION,INABILITY TO HAVE BOWEL MOVEMENTS,BLOOD IN URINE   Ciprofloxacin Rash    Yeast infection and a severe rash    Codeine Itching and Rash   Other Palpitations    "makes me feel like I'm having a heart attack. -- Steroids      Outpatient Medications Prior to Visit  Medication Sig Dispense Refill   acetaminophen (TYLENOL) 500 MG tablet Take 1,000 mg by mouth every 6 (six) hours as needed for moderate pain.     AgaMatrix Ultra-Thin Lancets MISC 1 LANCET A DAILY; DX CODE E11.65.     aspirin EC 81 MG tablet Take 81 mg by mouth daily.     budesonide-formoterol  (SYMBICORT) 80-4.5 MCG/ACT inhaler Inhale 2 puffs into the lungs 2 (two) times daily as needed (shortness of breath).      Cholecalciferol (VITAMIN D3) 50 MCG (2000 UT) CAPS Take 2,000 Units by mouth daily.     diclofenac (CATAFLAM) 50 MG tablet Take 50 mg by mouth daily.      HM LIDOCAINE PATCH EX Apply topically. 1%     hydrochlorothiazide (MICROZIDE) 12.5 MG capsule Take 1 capsule (12.5 mg total) by mouth daily. 90 capsule 3   levothyroxine (SYNTHROID, LEVOTHROID) 175 MCG tablet Take 175-262.5 mcg by mouth See admin instructions. Take 262.5 mcg  on Monday and Friday Tale 175 mcg on Tuesday, Wednesday, Thursday, Saturday, and Sunday     losartan (COZAAR) 50 MG tablet Take 50 mg by mouth daily.     metFORMIN (GLUCOPHAGE-XR) 500 MG 24 hr tablet Take 2 tablets by mouth 2 (two) times daily.     nitroGLYCERIN (NITROSTAT) 0.4 MG SL tablet Place 1 tablet (0.4 mg total) under the tongue every 5 (five) minutes as needed for chest pain. 90 tablet 3   omeprazole (PRILOSEC) 40 MG capsule Take 40 mg by mouth daily.     rosuvastatin (CRESTOR) 5 MG tablet Take 1 tablet (5 mg total) by mouth daily. 90 tablet 3   Semaglutide (OZEMPIC, 0.25 OR 0.5 MG/DOSE, Morning Sun) Inject 0.25 mg into the skin every Sunday.      isosorbide mononitrate (IMDUR) 120 MG 24 hr tablet Take 1 tablet (120 mg total) by mouth daily. 90 tablet 3   metFORMIN (  GLUCOPHAGE-XR) 500 MG 24 hr tablet Take 1,000 mg by mouth 2 (two) times daily.     clindamycin (CLEOCIN) 300 MG capsule Take 300 mg by mouth 3 (three) times daily.     linaclotide (LINZESS) 290 MCG CAPS capsule Take 290 mcg by mouth as needed (constipation). Taking every 3rd day     lubiprostone (AMITIZA) 24 MCG capsule Take 1 capsule (24 mcg total) by mouth 2 (two) times daily with a meal. 60 capsule 5   nystatin cream (MYCOSTATIN) Apply 1 application topically See admin instructions. Apply once daily may apply a second time as needed for rash     No facility-administered medications prior  to visit.    Radiology:   Chest X-ray 2 View 07/04/2019 Faint opacity within the peripheral aspect of the left lower lobe could reflect a small developing infiltrate. Lungs are otherwise clear.  Cardiac Studies:   ZIO Monitor 7 days 09/17/2019 ZIO monitor was performed for 7 days and 2 hours beginning 08/27/2019 to evaluate palpitation. The rhythm throughout was sinus with minimum average and maximum heart rates of 50, 73 and 157 bpm. There were no pauses of 3 seconds or greater and there were no episodes of second or third-degree AV block or sinus node exit block. There where 5 triggered and 4 diary events predominantly seen with sinus rhythm although at times there were atrial or ventricular premature contractions. Ventricular ectopy was rare with PVCs and couplets.  There was one 7 beat run of PVCs present. Supraventricular ectopy was rare there were no episodes of atrial fibrillation or flutter.  There was one brief atrial tachycardias seen 9 complexes at a rate of 111 bpm  Conclusion, rare ventricular and supraventricular ectopy however there is one brief run of PVCs and one short episode of atrial tachycardia.  The diary and triggered events were predominantly sinus rhythm.  Myocardial Perfusion w/ Lexiscan Stress Test 09/17/2019 Nuclear stress EF: 61%. The left ventricular ejection fraction is normal (55-65%). No T wave inversion was noted during stress. There was no ST segment deviation noted during stress. Defect 1: There is a medium defect of moderate severity present in the basal inferior and mid inferior location. There is mild hypokinesis of the basal-mid inferior wall. Findings consistent with prior myocardial infarction with no evidence of peri-infarct ischemia. This is a low risk study.  Left Heart Catheterization 09/26/2019 The left ventricular systolic function is normal. LV end diastolic pressure is normal. There is no aortic valve stenosis. Prox RCA to Mid RCA  lesion is 10% stenosed. 1st Diag lesion is 55% stenosed. Mid LAD lesion is 10-20% stenosed. Angiographically normal coronary arteries with minimal CAD.  No obvious culprit lesion to explain symptoms or stress test results.  False Positive Nuclear Stress Test -> however cannot exclude possibility of microvascular disease. Normal LV function and EDP.  Carotid artery duplex 03/10/2020: The bifurcation, internal, external and common carotid arteries reveal no evidence of significant stenosis, bilaterally. Antegrade right vertebral artery flow. Antegrade left vertebral artery flow.   PCV ECHOCARDIOGRAM COMPLETE 01/10/2021  Narrative Echocardiogram 01/10/2021: Normal LV systolic function with visual EF 55-60%. Left ventricle cavity is normal in size. Moderate left ventricular hypertrophy. Normal global wall motion. Normal diastolic filling pattern, normal LAP. Moderate to severe aortic regurgitation, eccentric jet towards the anterior mitral valve leaflet. Mild tricuspid regurgitation. No evidence of pulmonary hypertension. The aortic root is normal (Sinotubular junction 3.6cm). Mildly dilated proximal ascending aorta  4.4cm. Compared to study 03/11/2020: AR  remains stable, proximal  ascending aorta is 4.4cm (not previously reported).    EKG:   EKG 01/20/2021: Normal sinus rhythm at rate of 69 bpm, left axis deviation, left anterior fascicular block.  Incomplete right bundle branch block.  No evidence of ischemia.  No significant change from 03/05/2020.  Assessment     ICD-10-CM   1. Moderate to severe aortic valve regurgitation  I35.1     2. Aortic root dilatation (HCC)  I77.810     3. Primary hypertension  I10 EKG 12-Lead    CBC    Basic metabolic panel    4. Hyperglycemia  R73.9     5. Angina pectoris with normal coronary arteriogram ([facility])  I20.9 isosorbide mononitrate (IMDUR) 120 MG 24 hr tablet     Medications Discontinued During This Encounter  Medication Reason   clindamycin  (CLEOCIN) 300 MG capsule Error   linaclotide (LINZESS) 290 MCG CAPS capsule Error   lubiprostone (AMITIZA) 24 MCG capsule Error   nystatin cream (MYCOSTATIN) Error   metFORMIN (GLUCOPHAGE-XR) 500 MG 24 hr tablet Duplicate   isosorbide mononitrate (IMDUR) 120 MG 24 hr tablet Reorder    Meds ordered this encounter  Medications   isosorbide mononitrate (IMDUR) 120 MG 24 hr tablet    Sig: Take 1 tablet (120 mg total) by mouth daily.    Dispense:  90 tablet    Refill:  3   Orders Placed This Encounter  Procedures   CBC   Basic metabolic panel   EKG [id number]    Recommendations:   Joanna Alvarado is a 65 y.o. female with a past medical history of hypertension, controlled diabetes mellitus, now only has hyperglycemia with weight loss hypercholesteremia, morbid obesity with sleep apnea using CPAP at night, GERD, hiatal hernia, fibromyalgia, hypothyroidism and chronic stable angina with normal coronary arteries/minimal coronary artery disease by angiography in April 2021.  She presents for follow-up of angina pectoris.  She has noticed worsening dyspnea on exertion and also more frequent episodes of angina since last office visit 6 months ago.  Patient is here on a 40-monthoffice visit, symptoms of angina and also dyspnea has been worsening in spite of weight loss and good blood pressure control.  Given moderately severe aortic regurgitation, suspect she will need valve replacement.  Her husband is present.  I discussed continued medical therapy versus proceeding with aortic valve replacement, patient states that she is doing the best she can and it has really been lifestyle limiting that anytime she tries to exercise she gets significant chest discomfort for which she has to frequently take nitroglycerin.  No clinical evidence of heart failure, blood pressure is well controlled, lipids are being managed by PCP, I also reviewed her labs, she is not a diabetic anymore with weight loss and she has  mild hyperglycemia only.  She would be overall a low risk except weight is prohibitory.  She will continue to lose weight.  She has lost about 25 to 30 pounds in weight over the past 6 to 8 months.  I will see her back after cardiac catheterization, if coronary arteries are normal, I will refer her for surgical consultation.  She will also need right heart catheterization. Schedule for cardiac catheterization, and possible angioplasty. We discussed regarding risks, benefits, alternatives to this including stress testing, CTA and continued medical therapy. Patient wants to proceed. Understands <1-2% risk of death, stroke, MI, urgent CABG, bleeding, infection, renal failure but not limited to these.     JAdrian Prows MD, FSur[facility] Of [facility]8/18/2022,  9:38 AM Office: [phone]

## 2021-01-21 DIAGNOSIS — I10 Essential (primary) hypertension: Secondary | ICD-10-CM | POA: Diagnosis not present

## 2021-01-22 LAB — BASIC METABOLIC PANEL
BUN/Creatinine Ratio: 15 (ref 12–28)
BUN: 22 mg/dL (ref 8–27)
CO2: 20 mmol/L (ref 20–29)
Calcium: 10.4 mg/dL — ABNORMAL HIGH (ref 8.7–10.3)
Chloride: 108 mmol/L — ABNORMAL HIGH (ref 96–106)
Creatinine, Ser: 1.42 mg/dL — ABNORMAL HIGH (ref 0.57–1.00)
Glucose: 80 mg/dL (ref 65–99)
Potassium: 3.8 mmol/L (ref 3.5–5.2)
Sodium: 144 mmol/L (ref 134–144)
eGFR: 41 mL/min/{1.73_m2} — ABNORMAL LOW (ref >59)

## 2021-01-22 LAB — CBC
Hematocrit: 36 % (ref 34.0–46.6)
Hemoglobin: 12.7 g/dL (ref 11.1–15.9)
MCH: 32.5 pg (ref 26.6–33.0)
MCHC: 35.3 g/dL (ref 31.5–35.7)
MCV: 92 fL (ref 79–97)
NRBC: 1 % — ABNORMAL HIGH (ref 0–0)
Platelets: 93 10*3/uL — CL (ref 150–450)
RBC: 3.91 x10E6/uL (ref 3.77–5.28)
RDW: 13 % (ref 11.7–15.4)
WBC: 4.4 10*3/uL (ref 3.4–10.8)

## 2021-02-09 DIAGNOSIS — Z20828 Contact with and (suspected) exposure to other viral communicable diseases: Secondary | ICD-10-CM | POA: Diagnosis not present

## 2021-02-09 DIAGNOSIS — H109 Unspecified conjunctivitis: Secondary | ICD-10-CM | POA: Diagnosis not present

## 2021-02-09 DIAGNOSIS — R0981 Nasal congestion: Secondary | ICD-10-CM | POA: Diagnosis not present

## 2021-02-14 DIAGNOSIS — I351 Nonrheumatic aortic (valve) insufficiency: Secondary | ICD-10-CM

## 2021-02-15 ENCOUNTER — Ambulatory Visit (HOSPITAL_COMMUNITY)
Admission: RE | Admit: 2021-02-15 | Discharge: 2021-02-15 | Disposition: A | Discharge status: Home / Self Care | Payer: HMO | Attending: Cardiology | Admitting: Cardiology

## 2021-02-15 ENCOUNTER — Other Ambulatory Visit: Payer: Self-pay

## 2021-02-15 ENCOUNTER — Encounter (HOSPITAL_COMMUNITY)
Admission: RE | Disposition: A | Discharge status: Home / Self Care | Payer: Self-pay | Source: Home / Self Care | Attending: Cardiology

## 2021-02-15 DIAGNOSIS — I10 Essential (primary) hypertension: Secondary | ICD-10-CM | POA: Insufficient documentation

## 2021-02-15 DIAGNOSIS — Z881 Allergy status to other antibiotic agents status: Secondary | ICD-10-CM | POA: Insufficient documentation

## 2021-02-15 DIAGNOSIS — E039 Hypothyroidism, unspecified: Secondary | ICD-10-CM | POA: Diagnosis not present

## 2021-02-15 DIAGNOSIS — G4733 Obstructive sleep apnea (adult) (pediatric): Secondary | ICD-10-CM | POA: Diagnosis not present

## 2021-02-15 DIAGNOSIS — Z7982 Long term (current) use of aspirin: Secondary | ICD-10-CM | POA: Diagnosis not present

## 2021-02-15 DIAGNOSIS — Z7984 Long term (current) use of oral hypoglycemic drugs: Secondary | ICD-10-CM | POA: Insufficient documentation

## 2021-02-15 DIAGNOSIS — R739 Hyperglycemia, unspecified: Secondary | ICD-10-CM | POA: Diagnosis not present

## 2021-02-15 DIAGNOSIS — I351 Nonrheumatic aortic (valve) insufficiency: Secondary | ICD-10-CM | POA: Diagnosis not present

## 2021-02-15 DIAGNOSIS — Z885 Allergy status to narcotic agent status: Secondary | ICD-10-CM | POA: Insufficient documentation

## 2021-02-15 DIAGNOSIS — Z79899 Other long term (current) drug therapy: Secondary | ICD-10-CM | POA: Insufficient documentation

## 2021-02-15 DIAGNOSIS — Z6841 Body Mass Index (BMI) 40.0 and over, adult: Secondary | ICD-10-CM | POA: Insufficient documentation

## 2021-02-15 DIAGNOSIS — E7800 Pure hypercholesterolemia, unspecified: Secondary | ICD-10-CM | POA: Diagnosis not present

## 2021-02-15 DIAGNOSIS — Z91048 Other nonmedicinal substance allergy status: Secondary | ICD-10-CM | POA: Insufficient documentation

## 2021-02-15 DIAGNOSIS — I209 Angina pectoris, unspecified: Secondary | ICD-10-CM | POA: Diagnosis not present

## 2021-02-15 DIAGNOSIS — I77810 Thoracic aortic ectasia: Secondary | ICD-10-CM | POA: Diagnosis not present

## 2021-02-15 DIAGNOSIS — Z79890 Hormone replacement therapy (postmenopausal): Secondary | ICD-10-CM | POA: Insufficient documentation

## 2021-02-15 HISTORY — PX: RIGHT HEART CATH: CATH118263

## 2021-02-15 LAB — POCT I-STAT EG7
Acid-base deficit: 3 mmol/L — ABNORMAL HIGH (ref 0.0–2.0)
Acid-base deficit: 3 mmol/L — ABNORMAL HIGH (ref 0.0–2.0)
Acid-base deficit: 3 mmol/L — ABNORMAL HIGH (ref 0.0–2.0)
Acid-base deficit: 3 mmol/L — ABNORMAL HIGH (ref 0.0–2.0)
Acid-base deficit: 4 mmol/L — ABNORMAL HIGH (ref 0.0–2.0)
Bicarbonate: 20.8 mmol/L (ref 20.0–28.0)
Bicarbonate: 21.3 mmol/L (ref 20.0–28.0)
Bicarbonate: 21.4 mmol/L (ref 20.0–28.0)
Bicarbonate: 21.6 mmol/L (ref 20.0–28.0)
Bicarbonate: 21.8 mmol/L (ref 20.0–28.0)
Calcium, Ion: 1.5 mmol/L — ABNORMAL HIGH (ref 1.15–1.40)
Calcium, Ion: 1.51 mmol/L (ref 1.15–1.40)
Calcium, Ion: 1.51 mmol/L (ref 1.15–1.40)
Calcium, Ion: 1.51 mmol/L (ref 1.15–1.40)
Calcium, Ion: 1.53 mmol/L (ref 1.15–1.40)
HCT: 29 % — ABNORMAL LOW (ref 36.0–46.0)
HCT: 29 % — ABNORMAL LOW (ref 36.0–46.0)
HCT: 29 % — ABNORMAL LOW (ref 36.0–46.0)
HCT: 30 % — ABNORMAL LOW (ref 36.0–46.0)
HCT: 31 % — ABNORMAL LOW (ref 36.0–46.0)
Hemoglobin: 10.2 g/dL — ABNORMAL LOW (ref 12.0–15.0)
Hemoglobin: 10.5 g/dL — ABNORMAL LOW (ref 12.0–15.0)
Hemoglobin: 9.9 g/dL — ABNORMAL LOW (ref 12.0–15.0)
Hemoglobin: 9.9 g/dL — ABNORMAL LOW (ref 12.0–15.0)
Hemoglobin: 9.9 g/dL — ABNORMAL LOW (ref 12.0–15.0)
O2 Saturation: 79 %
O2 Saturation: 80 %
O2 Saturation: 80 %
O2 Saturation: 80 %
O2 Saturation: 81 %
Potassium: 3.5 mmol/L (ref 3.5–5.1)
Potassium: 3.5 mmol/L (ref 3.5–5.1)
Potassium: 3.5 mmol/L (ref 3.5–5.1)
Potassium: 3.6 mmol/L (ref 3.5–5.1)
Potassium: 3.6 mmol/L (ref 3.5–5.1)
Sodium: 144 mmol/L (ref 135–145)
Sodium: 144 mmol/L (ref 135–145)
Sodium: 144 mmol/L (ref 135–145)
Sodium: 144 mmol/L (ref 135–145)
Sodium: 145 mmol/L (ref 135–145)
TCO2: 22 mmol/L (ref 22–32)
TCO2: 22 mmol/L (ref 22–32)
TCO2: 23 mmol/L (ref 22–32)
TCO2: 23 mmol/L (ref 22–32)
TCO2: 23 mmol/L (ref 22–32)
pCO2, Ven: 35.1 mmHg — ABNORMAL LOW (ref 44.0–60.0)
pCO2, Ven: 35.3 mmHg — ABNORMAL LOW (ref 44.0–60.0)
pCO2, Ven: 36.1 mmHg — ABNORMAL LOW (ref 44.0–60.0)
pCO2, Ven: 36.3 mmHg — ABNORMAL LOW (ref 44.0–60.0)
pCO2, Ven: 36.7 mmHg — ABNORMAL LOW (ref 44.0–60.0)
pH, Ven: 7.377 (ref 7.250–7.430)
pH, Ven: 7.381 (ref 7.250–7.430)
pH, Ven: 7.382 (ref 7.250–7.430)
pH, Ven: 7.387 (ref 7.250–7.430)
pH, Ven: 7.389 (ref 7.250–7.430)
pO2, Ven: 44 mmHg (ref 32.0–45.0)
pO2, Ven: 44 mmHg (ref 32.0–45.0)
pO2, Ven: 45 mmHg (ref 32.0–45.0)
pO2, Ven: 45 mmHg (ref 32.0–45.0)
pO2, Ven: 46 mmHg — ABNORMAL HIGH (ref 32.0–45.0)

## 2021-02-15 LAB — GLUCOSE, CAPILLARY
Glucose-Capillary: 86 mg/dL (ref 70–99)
Glucose-Capillary: 71 mg/dL (ref 70–99)

## 2021-02-15 SURGERY — RIGHT HEART CATH
Anesthesia: LOCAL

## 2021-02-15 MED ORDER — SODIUM CHLORIDE 0.9% FLUSH: 3.0000 mL | Freq: Two times a day (BID) | INTRAVENOUS | Status: DC

## 2021-02-15 MED ORDER — LIDOCAINE HCL (PF) 1 % IJ SOLN
INTRAMUSCULAR | Status: DC | PRN
  Administered 2021-02-15: 2 mL

## 2021-02-15 MED ORDER — SODIUM CHLORIDE 0.9 % IV SOLN: 250.0000 mL | INTRAVENOUS | Status: DC | PRN

## 2021-02-15 MED ORDER — SODIUM CHLORIDE 0.9% FLUSH: 3.0000 mL | INTRAVENOUS | Status: DC | PRN

## 2021-02-15 MED ORDER — HEPARIN (PORCINE) IN NACL 1000-0.9 UT/500ML-% IV SOLN
INTRAVENOUS | Status: DC | PRN
  Administered 2021-02-15: 500 mL

## 2021-02-15 MED ORDER — LABETALOL HCL 5 MG/ML IV SOLN: 10.0000 mg | INTRAVENOUS | Status: DC | PRN

## 2021-02-15 MED ORDER — ASPIRIN 81 MG PO CHEW: 81.0000 mg | CHEWABLE_TABLET | ORAL | Status: DC

## 2021-02-15 MED ORDER — HEPARIN (PORCINE) IN NACL 1000-0.9 UT/500ML-% IV SOLN
INTRAVENOUS | Status: AC
  Filled 2021-02-15: qty 500

## 2021-02-15 MED ORDER — ONDANSETRON HCL 4 MG/2ML IJ SOLN: 4.0000 mg | Freq: Four times a day (QID) | INTRAMUSCULAR | Status: DC | PRN

## 2021-02-15 MED ORDER — LIDOCAINE HCL (PF) 1 % IJ SOLN
INTRAMUSCULAR | Status: AC
  Filled 2021-02-15: qty 30

## 2021-02-15 MED ORDER — SODIUM CHLORIDE 0.9 % IV SOLN: INTRAVENOUS | Status: DC

## 2021-02-15 MED ORDER — ACETAMINOPHEN 325 MG PO TABS: 650.0000 mg | ORAL_TABLET | ORAL | Status: DC | PRN

## 2021-02-15 MED ORDER — HYDRALAZINE HCL 20 MG/ML IJ SOLN: 10.0000 mg | INTRAMUSCULAR | Status: DC | PRN

## 2021-02-15 SURGICAL SUPPLY — 7 items
CATH SWAN GANZ 7F STRAIGHT (CATHETERS) ×1 IMPLANT
GLIDESHEATH SLENDER 7FR .021G (SHEATH) ×1 IMPLANT
PACK CARDIAC CATHETERIZATION (CUSTOM PROCEDURE TRAY) ×1 IMPLANT
TRANSDUCER W/STOPCOCK (MISCELLANEOUS) ×1 IMPLANT
TUBING ART PRESS 72  MALE/FEM (TUBING) ×2
TUBING ART PRESS 72 MALE/FEM (TUBING) IMPLANT
WIRE EMERALD 3MM-J .025X260CM (WIRE) ×1 IMPLANT

## 2021-02-15 NOTE — H&P (Signed)
OV 01/20/2021 copied for documentation    Primary Physician/Referring:  Ronita Hipps, MD  Patient ID: Joanna Alvarado, female    DOB: [birth year], 65 y.o.   MRN: [id number]  Chief Complaint  Patient presents with   Follow-up   aortic regurgitation   Chest Pain   Hypertension   HPI:    Joanna Alvarado  is a 65 y.o. female with a past medical history of hypertension, controlled diabetes mellitus, now only has hyperglycemia with weight loss hypercholesteremia, morbid obesity with sleep apnea using CPAP at night, GERD, hiatal hernia, fibromyalgia, hypothyroidism and chronic stable angina with normal coronary arteries/minimal coronary artery disease by angiography in April 2021.  She presents for follow-up of angina pectoris.  She has noticed worsening dyspnea on exertion and also more frequent episodes of angina since last office visit 6 months ago.  No PND or orthopnea, no leg edema. She also has degenerative joint disease, she is awaiting surgical evaluation for right hip replacement and right knee replacement as well. Past Medical History:  Diagnosis Date   Abnormal myocardial perfusion study 09/26/2019   Anemia    Angina pectoris ([place]) - Class II-III 09/26/2019   Arthritis    KNEES   Benign neoplasm of ascending colon    Benign neoplasm of cecum    Benign neoplasm of descending colon    Benign neoplasm of sigmoid colon    Benign neoplasm of transverse colon    Bowel habit changes    Cataract    BILATERAL-REMOVED   Chronic postoperative pain 12/02/2012   Diabetes mellitus without complication ([address])    Disturbance of skin sensation 12/02/2012   Fibromyalgia    GERD (gastroesophageal reflux disease)    History of colonic polyps    Hypertension    Hypothyroidism    Myalgia and myositis, unspecified 12/02/2012   Personal history of colonic polyps    Primary hypothyroidism 11/30/2015   Senile nuclear sclerosis 04/24/2014   Sleep apnea    uses c-pap   Thyroid disease    Thyroid  nodule 11/30/2015   Type 2 diabetes mellitus without complications ([place]) [id number]   Vitamin D deficiency 11/30/2015   Past Surgical History:  Procedure Laterality Date   ABDOMINAL HYSTERECTOMY     APPENDECTOMY     CHOLECYSTECTOMY     COLONOSCOPY N/A 07/27/2015   Procedure: COLONOSCOPY;  Surgeon: Irene Shipper, MD;  Location: [facility];  Service: Endoscopy;  Laterality: N/A;   COLONOSCOPY WITH PROPOFOL N/A 04/28/2019   Procedure: COLONOSCOPY WITH PROPOFOL;  Surgeon: Irene Shipper, MD;  Location: [facility];  Service: Endoscopy;  Laterality: N/A;   EYE SURGERY     bilateral cataracts with lens implants   floater bone surgery     bone removed from right hand   FOOT SURGERY     RIGHT   FOOT SURGERY Left    triple orthodesis   left foot surgery     removed tendon and placed pins in foot   LEFT HEART CATH AND CORONARY ANGIOGRAPHY N/A 09/26/2019   Procedure: LEFT HEART CATH AND CORONARY ANGIOGRAPHY;  Surgeon: Leonie Man, MD;  Location: [facility];  Service: Cardiovascular;  Laterality: N/A;   PLANTAR FASCIA SURGERY     both feet   POLYPECTOMY  04/28/2019   Procedure: POLYPECTOMY;  Surgeon: Irene Shipper, MD;  Location: [facility];  Service: Endoscopy;;   TONSILLECTOMY     Family History  Problem Relation Age of Onset   Heart disease  Mother 94   Diabetes Mother    Hypertension Mother    Hypertension Father    Colon polyps Brother    Stroke Maternal Grandmother    Heart disease Paternal Grandmother    Throat cancer Paternal Grandfather    Colon cancer Neg Hx    Esophageal cancer Neg Hx    Rectal cancer Neg Hx    Stomach cancer Neg Hx     Social History   Tobacco Use   Smoking status: Never   Smokeless tobacco: Never  Substance Use Topics   Alcohol use: Never   Marital Status: Married  ROS  Review of Systems  Constitutional: Negative for malaise/fatigue.  Cardiovascular:  Positive for chest pain and dyspnea on exertion. Negative for leg swelling,  near-syncope, orthopnea, palpitations and syncope.  Respiratory:  Positive for snoring (on cpap).   Musculoskeletal:  Positive for arthritis and back pain.  Objective  Blood pressure 113/63, pulse 76, temperature 98 F (36.7 C), temperature source Temporal, resp. rate 17, height '5\' 7"'$  (1.702 m), weight (!) 304 lb 12.8 oz (138.3 kg), SpO2 98 %.  Vitals with BMI 01/20/2021 09/09/2020 07/21/2020  Height '5\' 7"'$  '5\' 7"'$  '5\' 7"'$   Weight 304 lbs 13 oz 323 lbs 10 oz 317 lbs 10 oz  BMI 47.73 123456 [id number]  Systolic 123456 [id number]  Diastolic 63 48 60  Pulse 76 68 82     Physical Exam Constitutional:      Comments: Morbidly obese. No acute distress.  Cardiovascular:     Rate and Rhythm: Normal rate and regular rhythm.     Pulses:          Carotid pulses are 2+ on the right side with bruit and 2+ on the left side with bruit.      Radial pulses are 2+ on the right side and 2+ on the left side.       Dorsalis pedis pulses are 2+ on the right side and 2+ on the left side.       Posterior tibial pulses are 2+ on the right side and 2+ on the left side.     Heart sounds: Murmur heard.  High-pitched blowing decrescendo early diastolic murmur is present with a grade of 2/4 at the upper right sternal border radiating to the apex.     Comments: Popliteal pulses difficult to palpate due to patient body habitus. No leg edema.  No JVD. Pulmonary:     Effort: Pulmonary effort is normal.     Breath sounds: Normal breath sounds.  Abdominal:     General: Bowel sounds are normal.     Palpations: Abdomen is soft.     Comments: Large pannus  Neurological:     General: No focal deficit present.     Mental Status: She is oriented to person, place, and time.   Laboratory examination:   No results for input(s): NA, K, CL, CO2, GLUCOSE, BUN, CREATININE, CALCIUM, GFRNONAA, GFRAA in the last 8760 hours.  CrCl cannot be calculated (Patient's most recent lab result is older than the maximum 21 days allowed.).  CMP Latest Ref  Rng & Units 09/30/2019 09/22/2019 07/04/2019  Glucose 65 - 99 mg/dL 157(H) 184(H) 408(H)  BUN 8 - 27 mg/dL '14 14 15  '$ Creatinine 0.57 - 1.00 mg/dL 1.18(H) 1.03(H) 1.23(H)  Sodium 134 - 144 mmol/L 142 141 135  Potassium 3.5 - 5.2 mmol/L 4.4 3.8 4.4  Chloride 96 - 106 mmol/L 108(H) 105 103  CO2 20 - 29 mmol/L  20 19(L) 23  Calcium 8.7 - 10.3 mg/dL 10.7(H) 10.3 10.1  Total Protein 6.0 - 8.5 g/dL 6.6 - -  Total Bilirubin 0.0 - 1.2 mg/dL 0.7 - -  Alkaline Phos 39 - 117 IU/L 84 - -  AST 0 - 40 IU/L 42(H) - -  ALT 0 - 32 IU/L 28 - -   CBC Latest Ref Rng & Units 09/22/2019 07/04/2019 08/15/2018  WBC 3.4 - 10.8 x10E3/uL 6.3 5.7 -  Hemoglobin 11.1 - 15.9 g/dL 14.5 14.2 13.6  Hematocrit 34.0 - 46.6 % 43.1 42.3 40.0  Platelets 150 - 450 x10E3/uL 134(L) 117(L) -    Lipid Panel No results for input(s): CHOL, TRIG, LDLCALC, VLDL, HDL, CHOLHDL, LDLDIRECT in the last 8760 hours.  External labs:    Labs 04/01/2020:  A1c 5.5%.  TSH normal.  Free T4 minimally elevated.  Vitamin D normal at 36.  A1C 6.7% 11/2019  Medications and allergies   Allergies  Allergen Reactions   Metamucil [Psyllium] Other (See Comments)    Severe constipation   Adhesive [Tape]     causes whelts and blisters   Hydrocodone-Acetaminophen Itching    Tolerates small/ infrequent doses   Oxycontin [Oxycodone Hcl]     "FELT LIKE HEAD WAS GOING TO EXPLODE"   Trulicity [Dulaglutide]     KIDNEY INFECTION,INABILITY TO HAVE BOWEL MOVEMENTS,BLOOD IN URINE   Ciprofloxacin Rash    Yeast infection and a severe rash    Codeine Itching and Rash   Other Palpitations    "makes me feel like I'm having a heart attack. -- Steroids      Outpatient Medications Prior to Visit  Medication Sig Dispense Refill   acetaminophen (TYLENOL) 500 MG tablet Take 1,000 mg by mouth every 6 (six) hours as needed for moderate pain.     AgaMatrix Ultra-Thin Lancets MISC 1 LANCET A DAILY; DX CODE E11.65.     aspirin EC 81 MG tablet Take 81 mg by mouth  daily.     budesonide-formoterol (SYMBICORT) 80-4.5 MCG/ACT inhaler Inhale 2 puffs into the lungs 2 (two) times daily as needed (shortness of breath).      Cholecalciferol (VITAMIN D3) 50 MCG (2000 UT) CAPS Take 2,000 Units by mouth daily.     diclofenac (CATAFLAM) 50 MG tablet Take 50 mg by mouth daily.      HM LIDOCAINE PATCH EX Apply topically. 1%     hydrochlorothiazide (MICROZIDE) 12.5 MG capsule Take 1 capsule (12.5 mg total) by mouth daily. 90 capsule 3   levothyroxine (SYNTHROID, LEVOTHROID) 175 MCG tablet Take 175-262.5 mcg by mouth See admin instructions. Take 262.5 mcg  on Monday and Friday Tale 175 mcg on Tuesday, Wednesday, Thursday, Saturday, and Sunday     losartan (COZAAR) 50 MG tablet Take 50 mg by mouth daily.     metFORMIN (GLUCOPHAGE-XR) 500 MG 24 hr tablet Take 2 tablets by mouth 2 (two) times daily.     nitroGLYCERIN (NITROSTAT) 0.4 MG SL tablet Place 1 tablet (0.4 mg total) under the tongue every 5 (five) minutes as needed for chest pain. 90 tablet 3   omeprazole (PRILOSEC) 40 MG capsule Take 40 mg by mouth daily.     rosuvastatin (CRESTOR) 5 MG tablet Take 1 tablet (5 mg total) by mouth daily. 90 tablet 3   Semaglutide (OZEMPIC, 0.25 OR 0.5 MG/DOSE, [place]) Inject 0.25 mg into the skin every Sunday.      isosorbide mononitrate (IMDUR) 120 MG 24 hr tablet Take 1 tablet (120 mg total)  by mouth daily. 90 tablet 3   metFORMIN (GLUCOPHAGE-XR) 500 MG 24 hr tablet Take 1,000 mg by mouth 2 (two) times daily.     clindamycin (CLEOCIN) 300 MG capsule Take 300 mg by mouth 3 (three) times daily.     linaclotide (LINZESS) 290 MCG CAPS capsule Take 290 mcg by mouth as needed (constipation). Taking every 3rd day     lubiprostone (AMITIZA) 24 MCG capsule Take 1 capsule (24 mcg total) by mouth 2 (two) times daily with a meal. 60 capsule 5   nystatin cream (MYCOSTATIN) Apply 1 application topically See admin instructions. Apply once daily may apply a second time as needed for rash     No  facility-administered medications prior to visit.    Radiology:   Chest X-ray 2 View 07/04/2019 Faint opacity within the peripheral aspect of the left lower lobe could reflect a small developing infiltrate. Lungs are otherwise clear.  Cardiac Studies:   ZIO Monitor 7 days 09/17/2019 ZIO monitor was performed for 7 days and 2 hours beginning 08/27/2019 to evaluate palpitation. The rhythm throughout was sinus with minimum average and maximum heart rates of 50, 73 and 157 bpm. There were no pauses of 3 seconds or greater and there were no episodes of second or third-degree AV block or sinus node exit block. There where 5 triggered and 4 diary events predominantly seen with sinus rhythm although at times there were atrial or ventricular premature contractions. Ventricular ectopy was rare with PVCs and couplets.  There was one 7 beat run of PVCs present. Supraventricular ectopy was rare there were no episodes of atrial fibrillation or flutter.  There was one brief atrial tachycardias seen 9 complexes at a rate of 111 bpm  Conclusion, rare ventricular and supraventricular ectopy however there is one brief run of PVCs and one short episode of atrial tachycardia.  The diary and triggered events were predominantly sinus rhythm.  Myocardial Perfusion w/ Lexiscan Stress Test 09/17/2019 Nuclear stress EF: 61%. The left ventricular ejection fraction is normal (55-65%). No T wave inversion was noted during stress. There was no ST segment deviation noted during stress. Defect 1: There is a medium defect of moderate severity present in the basal inferior and mid inferior location. There is mild hypokinesis of the basal-mid inferior wall. Findings consistent with prior myocardial infarction with no evidence of peri-infarct ischemia. This is a low risk study.  Left Heart Catheterization 09/26/2019 The left ventricular systolic function is normal. LV end diastolic pressure is normal. There is no aortic  valve stenosis. Prox RCA to Mid RCA lesion is 10% stenosed. 1st Diag lesion is 55% stenosed. Mid LAD lesion is 10-20% stenosed. Angiographically normal coronary arteries with minimal CAD.  No obvious culprit lesion to explain symptoms or stress test results.  False Positive Nuclear Stress Test -> however cannot exclude possibility of microvascular disease. Normal LV function and EDP.  Carotid artery duplex 03/10/2020: The bifurcation, internal, external and common carotid arteries reveal no evidence of significant stenosis, bilaterally. Antegrade right vertebral artery flow. Antegrade left vertebral artery flow.   PCV ECHOCARDIOGRAM COMPLETE 01/10/2021  Narrative Echocardiogram 01/10/2021: Normal LV systolic function with visual EF 55-60%. Left ventricle cavity is normal in size. Moderate left ventricular hypertrophy. Normal global wall motion. Normal diastolic filling pattern, normal LAP. Moderate to severe aortic regurgitation, eccentric jet towards the anterior mitral valve leaflet. Mild tricuspid regurgitation. No evidence of pulmonary hypertension. The aortic root is normal (Sinotubular junction 3.6cm). Mildly dilated proximal ascending aorta  4.4cm.  Compared to study 03/11/2020: AR  remains stable, proximal ascending aorta is 4.4cm (not previously reported).    EKG:   EKG 01/20/2021: Normal sinus rhythm at rate of 69 bpm, left axis deviation, left anterior fascicular block.  Incomplete right bundle branch block.  No evidence of ischemia.  No significant change from 03/05/2020.  Assessment     ICD-10-CM   1. Moderate to severe aortic valve regurgitation  I35.1     2. Aortic root dilatation (HCC)  I77.810     3. Primary hypertension  I10 EKG 12-Lead    CBC    Basic metabolic panel    4. Hyperglycemia  R73.9     5. Angina pectoris with normal coronary arteriogram (HCC)  I20.9 isosorbide mononitrate (IMDUR) 120 MG 24 hr tablet     Medications Discontinued During This Encounter   Medication Reason   clindamycin (CLEOCIN) 300 MG capsule Error   linaclotide (LINZESS) 290 MCG CAPS capsule Error   lubiprostone (AMITIZA) 24 MCG capsule Error   nystatin cream (MYCOSTATIN) Error   metFORMIN (GLUCOPHAGE-XR) 500 MG 24 hr tablet Duplicate   isosorbide mononitrate (IMDUR) 120 MG 24 hr tablet Reorder    Meds ordered this encounter  Medications   isosorbide mononitrate (IMDUR) 120 MG 24 hr tablet    Sig: Take 1 tablet (120 mg total) by mouth daily.    Dispense:  90 tablet    Refill:  3   Orders Placed This Encounter  Procedures   CBC   Basic metabolic panel   EKG [id number]    Recommendations:   Joanna Alvarado is a 65 y.o. female with a past medical history of hypertension, controlled diabetes mellitus, now only has hyperglycemia with weight loss hypercholesteremia, morbid obesity with sleep apnea using CPAP at night, GERD, hiatal hernia, fibromyalgia, hypothyroidism and chronic stable angina with normal coronary arteries/minimal coronary artery disease by angiography in April 2021.  She presents for follow-up of angina pectoris.  She has noticed worsening dyspnea on exertion and also more frequent episodes of angina since last office visit 6 months ago.  Patient is here on a 55-monthoffice visit, symptoms of angina and also dyspnea has been worsening in spite of weight loss and good blood pressure control.  Given moderately severe aortic regurgitation, suspect she will need valve replacement.  Her husband is present.  I discussed continued medical therapy versus proceeding with aortic valve replacement, patient states that she is doing the best she can and it has really been lifestyle limiting that anytime she tries to exercise she gets significant chest discomfort for which she has to frequently take nitroglycerin.  No clinical evidence of heart failure, blood pressure is well controlled, lipids are being managed by PCP, I also reviewed her labs, she is not a diabetic  anymore with weight loss and she has mild hyperglycemia only.  She would be overall a low risk except weight is prohibitory.  She will continue to lose weight.  She has lost about 25 to 30 pounds in weight over the past 6 to 8 months.  I will see her back after cardiac catheterization, if coronary arteries are normal, I will refer her for surgical consultation.  She will also need right heart catheterization. Schedule for cardiac catheterization, and possible angioplasty. We discussed regarding risks, benefits, alternatives to this including stress testing, CTA and continued medical therapy. Patient wants to proceed. Understands <1-2% risk of death, stroke, MI, urgent CABG, bleeding, infection, renal failure but not limited to these.  Adrian Prows, MD, [facility] 01/20/2021, 9:38 AM Office: [phone]

## 2021-02-15 NOTE — Interval H&P Note (Signed)
History and Physical Interval Note:  02/15/2021 9:46 AM  Joanna Alvarado  has presented today for surgery, with the diagnosis of HTN.  The various methods of treatment have been discussed with the patient and family. After consideration of risks, benefits and other options for treatment, the patient has consented to  Procedure(s): RIGHT HEART CATH (N/A) as a surgical intervention.  The patient's history has been reviewed, patient examined, no change in status, stable for surgery.  I have reviewed the patient's chart and labs.  Questions were answered to the patient's satisfaction.    2012 Appropriate Use Criteria for Diagnostic Catheterization Valvular Disease (Right and Left Heart Catheterization or Right Heart Catheterization Alone With or Without Left Ventriculography and Coronary Angiography) Indication:  Preoperative assessment before valvular surgery A (7) Indication: 70; Score 7 [place]

## 2021-02-15 NOTE — Discharge Instructions (Signed)
Radial Site Care  This sheet gives you information about how to care for yourself after your procedure. Your health care provider may also give you more specific instructions. If you have problems or questions, contact your health care provider. What can I expect after the procedure? After the procedure, it is common to have: Bruising and tenderness at the catheter insertion area. Follow these instructions at home: Medicines Take over-the-counter and prescription medicines only as told by your health care provider. Insertion site care Follow instructions from your health care provider about how to take care of your insertion site. Make sure you: Wash your hands with soap and water before you remove your bandage (dressing). If soap and water are not available, use hand sanitizer. May remove dressing in 24 hours. Check your insertion site every day for signs of infection. Check for: Redness, swelling, or pain. Fluid or blood. Pus or a bad smell. Warmth. Do no take baths, swim, or use a hot tub for 5 days. You may shower 24-48 hours after the procedure. Remove the dressing and gently wash the site with plain soap and water. Pat the area dry with a clean towel. Do not rub the site. That could cause bleeding. Do not apply powder or lotion to the site. Activity  For 24 hours after the procedure, or as directed by your health care provider: Do not flex or bend the affected arm. Do not push or pull heavy objects with the affected arm. Do not drive yourself home from the hospital or clinic. You may drive 24 hours after the procedure. Do not operate machinery or power tools. KEEP ARM ELEVATED THE REMAINDER OF THE DAY. Do not push, pull or lift anything that is heavier than 10 lb for 5 days. Ask your health care provider when it is okay to: Return to work or school. Resume usual physical activities or sports. Resume sexual activity. General instructions If the catheter site starts to  bleed, raise your arm and put firm pressure on the site. If the bleeding does not stop, get help right away. This is a medical emergency. DRINK PLENTY OF FLUIDS FOR THE NEXT 2-3 DAYS. No alcohol consumption for 24 hours after receiving sedation. If you went home on the same day as your procedure, a responsible adult should be with you for the first 24 hours after you arrive home. Keep all follow-up visits as told by your health care provider. This is important. Contact a health care provider if: You have a fever. You have redness, swelling, or yellow drainage around your insertion site. Get help right away if: You have unusual pain at the radial site. The catheter insertion area swells very fast. The insertion area is bleeding, and the bleeding does not stop when you hold steady pressure on the area. Your arm or hand becomes pale, cool, tingly, or numb. These symptoms may represent a serious problem that is an emergency. Do not wait to see if the symptoms will go away. Get medical help right away. Call your local emergency services (911 in the [place] Do not drive yourself to the hospital. Summary After the procedure, it is common to have bruising and tenderness at the site. Follow instructions from your health care provider about how to take care of your radial site wound. Check the wound every day for signs of infection.  This information is not intended to replace advice given to you by your health care provider. Make sure you discuss any questions you have with   your health care provider. Document Revised: 06/27/2017 Document Reviewed: 06/27/2017 Elsevier Patient Education  2020 [employer]  

## 2021-02-15 NOTE — Progress Notes (Signed)
Discharge instructions reviewed with pt voices understanding.  

## 2021-02-16 ENCOUNTER — Encounter (HOSPITAL_COMMUNITY): Payer: Self-pay | Admitting: Cardiology

## 2021-02-16 DIAGNOSIS — R0981 Nasal congestion: Secondary | ICD-10-CM | POA: Diagnosis not present

## 2021-02-16 DIAGNOSIS — Z6841 Body Mass Index (BMI) 40.0 and over, adult: Secondary | ICD-10-CM | POA: Diagnosis not present

## 2021-02-28 DIAGNOSIS — K219 Gastro-esophageal reflux disease without esophagitis: Secondary | ICD-10-CM | POA: Diagnosis not present

## 2021-02-28 DIAGNOSIS — J40 Bronchitis, not specified as acute or chronic: Secondary | ICD-10-CM | POA: Diagnosis not present

## 2021-02-28 DIAGNOSIS — J329 Chronic sinusitis, unspecified: Secondary | ICD-10-CM | POA: Diagnosis not present

## 2021-03-02 ENCOUNTER — Other Ambulatory Visit: Payer: Self-pay

## 2021-03-02 ENCOUNTER — Encounter: Payer: Self-pay | Admitting: Cardiology

## 2021-03-02 ENCOUNTER — Ambulatory Visit: Payer: PPO | Admitting: Cardiology

## 2021-03-02 ENCOUNTER — Telehealth: Payer: Self-pay | Admitting: Oncology

## 2021-03-02 VITALS — BP 119/66 | HR 83 | Temp 98.0°F | Resp 14 | Ht 67.0 in | Wt 315.8 lb

## 2021-03-02 DIAGNOSIS — I77810 Thoracic aortic ectasia: Secondary | ICD-10-CM | POA: Diagnosis not present

## 2021-03-02 DIAGNOSIS — I351 Nonrheumatic aortic (valve) insufficiency: Secondary | ICD-10-CM

## 2021-03-02 DIAGNOSIS — I209 Angina pectoris, unspecified: Secondary | ICD-10-CM | POA: Diagnosis not present

## 2021-03-02 DIAGNOSIS — R0981 Nasal congestion: Secondary | ICD-10-CM | POA: Diagnosis not present

## 2021-03-02 DIAGNOSIS — D61818 Other pancytopenia: Secondary | ICD-10-CM | POA: Diagnosis not present

## 2021-03-02 MED ORDER — DM-GUAIFENESIN ER 30-600 MG PO TB12: 1.0000 | ORAL_TABLET | Freq: Two times a day (BID) | ORAL | 0 refills | Status: DC

## 2021-03-02 NOTE — Telephone Encounter (Signed)
Scheduled appt per 9/28 referral. Pt is aware of appt date and time.

## 2021-03-02 NOTE — Progress Notes (Signed)
Primary Physician/Referring:  Ronita Hipps, MD  Patient ID: Joanna Alvarado, female    DOB: [birth year], 65 y.o.   MRN: [id number]  Chief Complaint  Patient presents with   post cath   Follow-up   HPI:    Joanna Alvarado  is a 65 y.o. ffemale with a past medical history of hypertension, controlled diabetes mellitus, now only has hyperglycemia with weight loss hypercholesteremia, morbid obesity with sleep apnea using CPAP at night, GERD, hiatal hernia, fibromyalgia, hypothyroidism and chronic stable angina with normal coronary arteries/minimal coronary artery disease by angiography in April 2021.  I seen her about 6 weeks ago for worsening symptoms of dyspnea and also recurrent episodes of angina pectoris, I had suspected moderately severe aortic regurgitation to be the etiology.  She underwent right heart catheterization . No PND or orthopnea, no leg edema.  She also has degenerative joint disease, she is awaiting surgical evaluation for right hip replacement and right knee replacement as well. Past Medical History:  Diagnosis Date   Abnormal myocardial perfusion study 09/26/2019   Anemia    Angina pectoris ([place]) - Class II-III 09/26/2019   Arthritis    KNEES   Benign neoplasm of ascending colon    Benign neoplasm of cecum    Benign neoplasm of descending colon    Benign neoplasm of sigmoid colon    Benign neoplasm of transverse colon    Bowel habit changes    Cataract    BILATERAL-REMOVED   Chronic postoperative pain 12/02/2012   Diabetes mellitus without complication ([place])    Disturbance of skin sensation 12/02/2012   Fibromyalgia    GERD (gastroesophageal reflux disease)    History of colonic polyps    Hypertension    Hypothyroidism    Myalgia and myositis, unspecified 12/02/2012   Personal history of colonic polyps    Primary hypothyroidism 11/30/2015   Senile nuclear sclerosis 04/24/2014   Sleep apnea    uses c-pap   Thyroid disease    Thyroid nodule 11/30/2015   Type  2 diabetes mellitus without complications ([place]) 08/65/7846   Vitamin D deficiency 11/30/2015   Past Surgical History:  Procedure Laterality Date   ABDOMINAL HYSTERECTOMY     APPENDECTOMY     CHOLECYSTECTOMY     COLONOSCOPY N/A 07/27/2015   Procedure: COLONOSCOPY;  Surgeon: Irene Shipper, MD;  Location: [facility];  Service: Endoscopy;  Laterality: N/A;   COLONOSCOPY WITH PROPOFOL N/A 04/28/2019   Procedure: COLONOSCOPY WITH PROPOFOL;  Surgeon: Irene Shipper, MD;  Location: [facility];  Service: Endoscopy;  Laterality: N/A;   EYE SURGERY     bilateral cataracts with lens implants   floater bone surgery     bone removed from right hand   FOOT SURGERY     RIGHT   FOOT SURGERY Left    triple orthodesis   left foot surgery     removed tendon and placed pins in foot   LEFT HEART CATH AND CORONARY ANGIOGRAPHY N/A 09/26/2019   Procedure: LEFT HEART CATH AND CORONARY ANGIOGRAPHY;  Surgeon: Leonie Man, MD;  Location: [facility];  Service: Cardiovascular;  Laterality: N/A;   PLANTAR FASCIA SURGERY     both feet   POLYPECTOMY  04/28/2019   Procedure: POLYPECTOMY;  Surgeon: Irene Shipper, MD;  Location: [facility];  Service: Endoscopy;;   RIGHT HEART CATH N/A 02/15/2021   Procedure: RIGHT HEART CATH;  Surgeon: Nigel Mormon, MD;  Location: [facility];  Service: Cardiovascular;  Laterality: N/A;   TONSILLECTOMY     Family History  Problem Relation Age of Onset   Heart disease Mother 12   Diabetes Mother    Hypertension Mother    Hypertension Father    Colon polyps Brother    Stroke Maternal Grandmother    Heart disease Paternal Grandmother    Throat cancer Paternal Grandfather    Colon cancer Neg Hx    Esophageal cancer Neg Hx    Rectal cancer Neg Hx    Stomach cancer Neg Hx     Social History   Tobacco Use   Smoking status: Never   Smokeless tobacco: Never  Substance Use Topics   Alcohol use: Never   Marital Status: Married  ROS  Review of  Systems  Constitutional: Negative for malaise/fatigue.  Cardiovascular:  Positive for dyspnea on exertion. Negative for chest pain, leg swelling, near-syncope, orthopnea, palpitations and syncope.  Respiratory:  Positive for snoring (on cpap).   Musculoskeletal:  Positive for arthritis and back pain.  Gastrointestinal:  Positive for constipation.  Objective  Blood pressure 119/66, pulse 83, temperature 98 F (36.7 C), temperature source Temporal, resp. rate 14, height 5\' 7"  (1.702 m), weight (!) 315 lb 12.8 oz (143.2 kg), SpO2 98 %.  Vitals with BMI 03/02/2021 02/15/2021 02/15/2021  Height 5\' 7"  - -  Weight 315 lbs 13 oz - -  BMI 07.37 - -  Systolic 106 269 -  Diastolic 66 53 -  Pulse 83 73 75     Physical Exam Constitutional:      Comments: Morbidly obese. No acute distress.  Cardiovascular:     Rate and Rhythm: Normal rate and regular rhythm.     Pulses:          Carotid pulses are 2+ on the right side with bruit and 2+ on the left side with bruit.      Radial pulses are 2+ on the right side and 2+ on the left side.       Dorsalis pedis pulses are 2+ on the right side and 2+ on the left side.       Posterior tibial pulses are 2+ on the right side and 2+ on the left side.     Heart sounds: Murmur heard.  High-pitched blowing decrescendo early diastolic murmur is present with a grade of 2/4 at the upper right sternal border radiating to the apex.     Comments: Popliteal pulses difficult to palpate due to patient body habitus. No leg edema.  No JVD. Pulmonary:     Effort: Pulmonary effort is normal.     Breath sounds: Normal breath sounds.  Abdominal:     General: Bowel sounds are normal.     Palpations: Abdomen is soft.     Comments: Large pannus  Neurological:     General: No focal deficit present.     Mental Status: She is oriented to person, place, and time.   Laboratory examination:   Recent Labs    01/21/21 1036 02/15/21 1002 02/15/21 1007 02/15/21 1010  02/15/21 1012  NA 144   < > 144 145 144  K 3.8   < > 3.5 3.5 3.5  CL 108*  --   --   --   --   CO2 20  --   --   --   --   GLUCOSE 80  --   --   --   --   BUN 22  --   --   --   --  CREATININE 1.42*  --   --   --   --   CALCIUM 10.4*  --   --   --   --    < > = values in this interval not displayed.    CrCl cannot be calculated (Patient's most recent lab result is older than the maximum 21 days allowed.).  CMP Latest Ref Rng & Units 02/15/2021 02/15/2021 02/15/2021  Glucose 65 - 99 mg/dL - - -  BUN 8 - 27 mg/dL - - -  Creatinine 0.57 - 1.00 mg/dL - - -  Sodium 135 - 145 mmol/L 144 145 144  Potassium 3.5 - 5.1 mmol/L 3.5 3.5 3.5  Chloride 96 - 106 mmol/L - - -  CO2 20 - 29 mmol/L - - -  Calcium 8.7 - 10.3 mg/dL - - -  Total Protein 6.0 - 8.5 g/dL - - -  Total Bilirubin 0.0 - 1.2 mg/dL - - -  Alkaline Phos 39 - 117 IU/L - - -  AST 0 - 40 IU/L - - -  ALT 0 - 32 IU/L - - -   CBC Latest Ref Rng & Units 02/15/2021 02/15/2021 02/15/2021  WBC 3.4 - 10.8 x10E3/uL - - -  Hemoglobin 12.0 - 15.0 g/dL 9.9(L) 10.5(L) 9.9(L)  Hematocrit 36.0 - 46.0 % 29.0(L) 31.0(L) 29.0(L)  Platelets 150 - 450 x10E3/uL - - -    Lipid Panel No results for input(s): CHOL, TRIG, LDLCALC, VLDL, HDL, CHOLHDL, LDLDIRECT in the last 8760 hours.  External labs:    Labs 04/01/2020:  A1c 5.5%.  TSH normal.  Free T4 minimally elevated.  Vitamin D normal at 36.  A1C 6.7% 11/2019  Medications and allergies   Allergies  Allergen Reactions   Metamucil [Psyllium] Other (See Comments)    Severe constipation   Trulicity [Dulaglutide]     KIDNEY INFECTION,INABILITY TO HAVE BOWEL MOVEMENTS,BLOOD IN URINE   Adhesive [Tape]     Latex, band aids  causes whelts and blisters   Hydrocodone-Acetaminophen Itching    Tolerates small/ infrequent doses   Oxycontin [Oxycodone Hcl]     "FELT LIKE HEAD WAS GOING TO EXPLODE"   Ciprofloxacin Rash    Yeast infection and a severe rash    Codeine Itching and Rash   Other  Palpitations    "makes me feel like I'm having a heart attack. -- Steroids      Outpatient Medications Prior to Visit  Medication Sig Dispense Refill   acetaminophen (TYLENOL) 500 MG tablet Take 1,000 mg by mouth every 6 (six) hours as needed for moderate pain.     AgaMatrix Ultra-Thin Lancets MISC 1 LANCET A DAILY; DX CODE E11.65.     amoxicillin-clavulanate (AUGMENTIN) 875-125 MG tablet Take 1 tablet by mouth 2 (two) times daily.     aspirin EC 81 MG tablet Take 81 mg by mouth daily.     budesonide-formoterol (SYMBICORT) 80-4.5 MCG/ACT inhaler Inhale 2 puffs into the lungs 2 (two) times daily as needed (shortness of breath).      Cholecalciferol (VITAMIN D3) 50 MCG (2000 UT) CAPS Take 2,000 Units by mouth daily.     diclofenac (CATAFLAM) 50 MG tablet Take 50 mg by mouth daily.      famotidine (PEPCID) 40 MG tablet Take 40 mg by mouth at bedtime.     hydrochlorothiazide (MICROZIDE) 12.5 MG capsule Take 1 capsule (12.5 mg total) by mouth daily. 90 capsule 3   isosorbide mononitrate (IMDUR) 120 MG 24 hr tablet Take 1 tablet (  120 mg total) by mouth daily. 90 tablet 3   levothyroxine (SYNTHROID, LEVOTHROID) 175 MCG tablet Take 175-262.5 mcg by mouth See admin instructions. Take 262.5 mcg  on Monday and Friday Tale 175 mcg on Tuesday, Wednesday, Thursday, Saturday, and Sunday     linaclotide (LINZESS) 290 MCG CAPS capsule Take 290 mcg by mouth daily before breakfast.     losartan (COZAAR) 50 MG tablet Take 50 mg by mouth daily.     nitroGLYCERIN (NITROSTAT) 0.4 MG SL tablet Place 0.4 mg under the tongue every 5 (five) minutes as needed for chest pain.     omeprazole (PRILOSEC) 40 MG capsule Take 40 mg by mouth daily.     polyethylene glycol (MIRALAX / GLYCOLAX) 17 g packet Take 17 g by mouth daily.     rosuvastatin (CRESTOR) 5 MG tablet Take 1 tablet (5 mg total) by mouth daily. 90 tablet 3   HM LIDOCAINE PATCH EX Apply 1 patch topically daily as needed (pain). 1% (Patient not taking: Reported on  03/02/2021)     metFORMIN (GLUCOPHAGE-XR) 500 MG 24 hr tablet Take 1,000 mg by mouth daily with breakfast.     vitamin B-12 (CYANOCOBALAMIN) 1000 MCG tablet Take 1,000 mcg by mouth daily.     No facility-administered medications prior to visit.    Radiology:   Chest X-ray 2 View 07/04/2019 Faint opacity within the peripheral aspect of the left lower lobe could reflect a small developing infiltrate. Lungs are otherwise clear.  Cardiac Studies:   ZIO Monitor 7 days 09/17/2019 ZIO monitor was performed for 7 days and 2 hours beginning 08/27/2019 to evaluate palpitation. The rhythm throughout was sinus with minimum average and maximum heart rates of 50, 73 and 157 bpm. There were no pauses of 3 seconds or greater and there were no episodes of second or third-degree AV block or sinus node exit block. There where 5 triggered and 4 diary events predominantly seen with sinus rhythm although at times there were atrial or ventricular premature contractions. Ventricular ectopy was rare with PVCs and couplets.  There was one 7 beat run of PVCs present. Supraventricular ectopy was rare there were no episodes of atrial fibrillation or flutter.  There was one brief atrial tachycardias seen 9 complexes at a rate of 111 bpm  Conclusion, rare ventricular and supraventricular ectopy however there is one brief run of PVCs and one short episode of atrial tachycardia.  The diary and triggered events were predominantly sinus rhythm.  Myocardial Perfusion w/ Lexiscan Stress Test 09/17/2019 Nuclear stress EF: 61%. The left ventricular ejection fraction is normal (55-65%). No T wave inversion was noted during stress. There was no ST segment deviation noted during stress. Defect 1: There is a medium defect of moderate severity present in the basal inferior and mid inferior location. There is mild hypokinesis of the basal-mid inferior wall. Findings consistent with prior myocardial infarction with no evidence of  peri-infarct ischemia. This is a low risk study.  Left Heart Catheterization 09/26/2019 The left ventricular systolic function is normal. LV end diastolic pressure is normal. There is no aortic valve stenosis. Prox RCA to Mid RCA lesion is 10% stenosed. 1st Diag lesion is 55% stenosed. Mid LAD lesion is 10-20% stenosed. Angiographically normal coronary arteries with minimal CAD.  No obvious culprit lesion to explain symptoms or stress test results.  False Positive Nuclear Stress Test -> however cannot exclude possibility of microvascular disease. Normal LV function and EDP.  Carotid artery duplex 03/10/2020: The bifurcation, internal, external  and common carotid arteries reveal no evidence of significant stenosis, bilaterally. Antegrade right vertebral artery flow. Antegrade left vertebral artery flow.   PCV ECHOCARDIOGRAM COMPLETE 01/10/2021  Narrative Echocardiogram 01/10/2021: Normal LV systolic function with visual EF 55-60%. Left ventricle cavity is normal in size. Moderate left ventricular hypertrophy. Normal global wall motion. Normal diastolic filling pattern, normal LAP. Moderate to severe aortic regurgitation, eccentric jet towards the anterior mitral valve leaflet. Mild tricuspid regurgitation. No evidence of pulmonary hypertension. The aortic root is normal (Sinotubular junction 3.6cm). Mildly dilated proximal ascending aorta  4.4cm. Compared to study 03/11/2020: AR  remains stable, proximal ascending aorta is 4.4cm (not previously reported).  Right heart catheterization 02/15/2021: RA: 5 mmHg RV: 22/0 mmHg PA: 23/14 mmHg, mPAP 18 mmHg PCW: 9 mmHg   CO: 9.2 L/min CI: 3.8 L/min/m2   Normal filling pressures No pulmonary hypertension    EKG:   EKG 01/20/2021: Normal sinus rhythm at rate of 69 bpm, left axis deviation, left anterior fascicular block.  Incomplete right bundle branch block.  No evidence of ischemia.  No significant change from 03/05/2020.  Assessment      ICD-10-CM   1. Moderate to severe aortic valve regurgitation  I35.1     2. Aortic root dilatation (HCC)  I77.810     3. Angina pectoris with normal coronary arteriogram ([facility]CC)  I20.9     4. Sinus congestion  R09.81 dextromethorphan-guaiFENesin (MUCINEX DM) 30-600 MG 12hr tablet    5. Pancytopenia ([place])  D61.818 Ambulatory referral to Hematology / Oncology     Medications Discontinued During This Encounter  Medication Reason   metFORMIN (GLUCOPHAGE-XR) 500 MG 24 hr tablet Error   vitamin B-12 (CYANOCOBALAMIN) 1000 MCG tablet Error    Meds ordered this encounter  Medications   dextromethorphan-guaiFENesin (MUCINEX DM) 30-600 MG 12hr tablet    Sig: Take 1 tablet by mouth 2 (two) times daily.    Dispense:  14 tablet    Refill:  0   Orders Placed This Encounter  Procedures   Ambulatory referral to Hematology / Oncology    Referral Priority:   Routine    Referral Type:   Consultation    Referral Reason:   Specialty Services Required    Requested Specialty:   Oncology    Number of Visits Requested:   1    Recommendations:   Joanna Alvarado is a 65 y.o. female with a past medical history of hypertension, controlled diabetes mellitus, now only has hyperglycemia with weight loss hypercholesteremia, morbid obesity with sleep apnea using CPAP at night, GERD, hiatal hernia, fibromyalgia, hypothyroidism and chronic stable angina with normal coronary arteries/minimal coronary artery disease by angiography in April 2021.  I seen her about 6 weeks ago for worsening symptoms of dyspnea and also recurrent episodes of angina pectoris, I had suspected moderately severe aortic regurgitation to be the etiology.  She underwent right heart catheterization which is fortunately normal without pulmonary hypertension.  She is now stable, she has not had any recurrence of angina pectoris, dyspnea has remained stable.  Dyspnea is related to multifactorial etiology especially obesity and deconditioning.  In  view of her underlying comorbidity, morbid obesity, need for aortic root replacement along with aortic valve replacement, she would be at high risk.  As she is asymptomatic, LV function is normal, no clinical evidence of heart failure, I would like to hold off on surgical intervention for now.  We will continue to follow her closely and if she has recurrence of angina then  we will consider surgery.  She has developed recurrent upper respiratory infection, she is got severe headache and also maxillary tenderness, prescribed her Mucinex, she is already on antibiotics.  She has pancytopenia with mild reduction in platelets, she is also dropped her hemoglobin recently, I would like to have hematology see her and work her up.  She has lost about  30 pounds in weight over the past 6 to 8 months.   Adrian Prows, MD, [facility] 03/02/2021, 10:39 AM Office: [phone]

## 2021-03-11 DIAGNOSIS — Z1231 Encounter for screening mammogram for malignant neoplasm of breast: Secondary | ICD-10-CM | POA: Diagnosis not present

## 2021-03-14 ENCOUNTER — Other Ambulatory Visit: Payer: Self-pay | Admitting: Oncology

## 2021-03-14 DIAGNOSIS — D696 Thrombocytopenia, unspecified: Secondary | ICD-10-CM

## 2021-03-14 NOTE — Progress Notes (Signed)
[facility]  [address] [place],  [place]  [address] [phone]  Clinic Day:  03/15/2021  Referring physician: Ronita Hipps, MD   HISTORY OF PRESENT ILLNESS:  The patient is a 65 y.o. female  who I was asked to consult upon for thrombocytopenia.  Labs in August 2022 showed a low platelet count of 93.  Labs have shown a progressively declining platelet count over the past few years.  She denies having any bruising/bleeding issues which ever alerted her to having severe thrombocytopenia.  She denies being placed on any new medications which can thrombocytopenia via bone marrow suppression.  She does have a first cousin who had leukemia.  Her thrombocytopenia scares her for potentially having the early stages of leukemia.    PAST MEDICAL HISTORY:   Past Medical History:  Diagnosis Date   Abnormal myocardial perfusion study 09/26/2019   Anemia    Angina pectoris ([place]) - Class II-III 09/26/2019   Arthritis    KNEES   Benign neoplasm of ascending colon    Benign neoplasm of cecum    Benign neoplasm of descending colon    Benign neoplasm of sigmoid colon    Benign neoplasm of transverse colon    Bowel habit changes    Cataract    BILATERAL-REMOVED   Chronic postoperative pain 12/02/2012   Diabetes mellitus without complication (HCC)    Disturbance of skin sensation 12/02/2012   Fibromyalgia    GERD (gastroesophageal reflux disease)    History of colonic polyps    Hypertension    Hypothyroidism    Myalgia and myositis, unspecified 12/02/2012   Personal history of colonic polyps    Primary hypothyroidism 11/30/2015   Senile nuclear sclerosis 04/24/2014   Sleep apnea    uses c-pap   Thyroid disease    Thyroid nodule 11/30/2015   Type 2 diabetes mellitus without complications ([place]) 18/84/1660   Vitamin D deficiency 11/30/2015    PAST SURGICAL HISTORY:   Past Surgical History:  Procedure Laterality Date   ABDOMINAL HYSTERECTOMY      APPENDECTOMY     CHOLECYSTECTOMY     COLONOSCOPY N/A 07/27/2015   Procedure: COLONOSCOPY;  Surgeon: Irene Shipper, MD;  Location: [facility];  Service: Endoscopy;  Laterality: N/A;   COLONOSCOPY WITH PROPOFOL N/A 04/28/2019   Procedure: COLONOSCOPY WITH PROPOFOL;  Surgeon: Irene Shipper, MD;  Location: [facility];  Service: Endoscopy;  Laterality: N/A;   EYE SURGERY     bilateral cataracts with lens implants   floater bone surgery     bone removed from right hand   FOOT SURGERY     RIGHT   FOOT SURGERY Left    triple orthodesis   left foot surgery     removed tendon and placed pins in foot   LEFT HEART CATH AND CORONARY ANGIOGRAPHY N/A 09/26/2019   Procedure: LEFT HEART CATH AND CORONARY ANGIOGRAPHY;  Surgeon: Leonie Man, MD;  Location: [facility];  Service: Cardiovascular;  Laterality: N/A;   PLANTAR FASCIA SURGERY     both feet   POLYPECTOMY  04/28/2019   Procedure: POLYPECTOMY;  Surgeon: Irene Shipper, MD;  Location: [facility];  Service: Endoscopy;;   RIGHT HEART CATH N/A 02/15/2021   Procedure: RIGHT HEART CATH;  Surgeon: Nigel Mormon, MD;  Location: [facility];  Service: Cardiovascular;  Laterality: N/A;   TONSILLECTOMY      CURRENT MEDICATIONS:   Current Outpatient Medications  Medication  Sig Dispense Refill   acetaminophen (TYLENOL) 500 MG tablet Take 1,000 mg by mouth every 6 (six) hours as needed for moderate pain.     AgaMatrix Ultra-Thin Lancets MISC 1 LANCET A DAILY; DX CODE E11.65.     amoxicillin-clavulanate (AUGMENTIN) 875-125 MG tablet Take 1 tablet by mouth 2 (two) times daily.     aspirin EC 81 MG tablet Take 81 mg by mouth daily.     budesonide-formoterol (SYMBICORT) 80-4.5 MCG/ACT inhaler Inhale 2 puffs into the lungs 2 (two) times daily as needed (shortness of breath).      Cholecalciferol (VITAMIN D3) 50 MCG (2000 UT) CAPS Take 2,000 Units by mouth daily.     dextromethorphan-guaiFENesin (MUCINEX DM) 30-600 MG 12hr tablet  Take 1 tablet by mouth 2 (two) times daily. 14 tablet 0   diclofenac (CATAFLAM) 50 MG tablet Take 50 mg by mouth daily.      famotidine (PEPCID) 40 MG tablet Take 40 mg by mouth at bedtime.     HM LIDOCAINE PATCH EX Apply 1 patch topically daily as needed (pain). 1% (Patient not taking: Reported on 03/02/2021)     hydrochlorothiazide (MICROZIDE) 12.5 MG capsule Take 1 capsule (12.5 mg total) by mouth daily. 90 capsule 3   isosorbide mononitrate (IMDUR) 120 MG 24 hr tablet Take 1 tablet (120 mg total) by mouth daily. 90 tablet 3   levothyroxine (SYNTHROID, LEVOTHROID) 175 MCG tablet Take 175-262.5 mcg by mouth See admin instructions. Take 262.5 mcg  on Monday and Friday Tale 175 mcg on Tuesday, Wednesday, Thursday, Saturday, and Sunday     linaclotide (LINZESS) 290 MCG CAPS capsule Take 290 mcg by mouth daily before breakfast.     losartan (COZAAR) 50 MG tablet Take 50 mg by mouth daily.     nitroGLYCERIN (NITROSTAT) 0.4 MG SL tablet Place 0.4 mg under the tongue every 5 (five) minutes as needed for chest pain.     omeprazole (PRILOSEC) 40 MG capsule Take 40 mg by mouth daily.     polyethylene glycol (MIRALAX / GLYCOLAX) 17 g packet Take 17 g by mouth daily.     rosuvastatin (CRESTOR) 5 MG tablet TAKE ONE TABLET BY MOUTH DAILY 90 tablet 3   No current facility-administered medications for this visit.    ALLERGIES:   Allergies  Allergen Reactions   Metamucil [Psyllium] Other (See Comments)    Severe constipation   Trulicity [Dulaglutide]     KIDNEY INFECTION,INABILITY TO HAVE BOWEL MOVEMENTS,BLOOD IN URINE   Adhesive [Tape]     Latex, band aids  causes whelts and blisters   Hydrocodone-Acetaminophen Itching    Tolerates small/ infrequent doses   Oxycontin [Oxycodone Hcl]     "FELT LIKE HEAD WAS GOING TO EXPLODE"   Ciprofloxacin Rash    Yeast infection and a severe rash    Codeine Itching and Rash   Other Palpitations    "makes me feel like I'm having a heart attack. -- Steroids      FAMILY HISTORY:   Family History  Problem Relation Age of Onset   Heart disease Mother 48   Diabetes Mother    Hypertension Mother    Hypertension Father    Colon polyps Brother    Stroke Maternal Grandmother    Heart disease Paternal Grandmother    Throat cancer Paternal Grandfather    Colon cancer Neg Hx    Esophageal cancer Neg Hx    Rectal cancer Neg Hx    Stomach cancer Neg Hx  SOCIAL HISTORY:  The patient was born and raised in [place].  She has been married for 20 years, with 2 grandchildren and 12 grandchildren.  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  There is no history of alcohol or tobacco abuse.    REVIEW OF SYSTEMS:  Review of Systems  Constitutional:  Negative for fatigue and fever.  HENT:   Negative for hearing loss and sore throat.   Eyes:  Negative for eye problems.  Respiratory:  Positive for shortness of breath. Negative for chest tightness, cough and hemoptysis.   Cardiovascular:  Positive for chest pain. Negative for palpitations.  Gastrointestinal:  Positive for constipation. Negative for abdominal distention, abdominal pain, blood in stool, diarrhea, nausea and vomiting.  Endocrine: Negative for hot flashes.  Genitourinary:  Negative for difficulty urinating, dysuria, frequency, hematuria and nocturia.   Musculoskeletal:  Positive for arthralgias. Negative for back pain, gait problem and myalgias.  Skin: Negative.  Negative for itching and rash.  Neurological: Negative.  Negative for dizziness, extremity weakness, gait problem, headaches, light-headedness and numbness.  Hematological: Negative.   Psychiatric/Behavioral: Negative.  Negative for depression and suicidal ideas. The patient is not nervous/anxious.     PHYSICAL EXAM:  Blood pressure (!) 155/71, pulse 70, temperature 98.2 F (36.8 C), resp. rate 16, height 5\' 7"  (1.702 m), weight 293 lb 3.2 oz (133 kg), SpO2 96 %. Wt Readings from Last 3 Encounters:  03/15/21 293 lb 3.2 oz  (133 kg)  03/02/21 (!) 315 lb 12.8 oz (143.2 kg)  02/15/21 297 lb (134.7 kg)   Body mass index is 45.92 kg/m. Performance status (ECOG): 1 - Symptomatic but completely ambulatory Physical Exam Constitutional:      Appearance: Normal appearance. She is not ill-appearing.  HENT:     Mouth/Throat:     Mouth: Mucous membranes are moist.     Pharynx: Oropharynx is clear. No oropharyngeal exudate or posterior oropharyngeal erythema.  Cardiovascular:     Rate and Rhythm: Normal rate and regular rhythm.     Heart sounds: No murmur heard.   No friction rub. No gallop.  Pulmonary:     Effort: Pulmonary effort is normal. No respiratory distress.     Breath sounds: Normal breath sounds. No wheezing, rhonchi or rales.  Abdominal:     General: Bowel sounds are normal. There is no distension.     Palpations: Abdomen is soft. There is no mass.     Tenderness: There is no abdominal tenderness.  Musculoskeletal:        General: No swelling.     Right lower leg: No edema.     Left lower leg: No edema.  Lymphadenopathy:     Cervical: No cervical adenopathy.     Upper Body:     Right upper body: No supraclavicular or axillary adenopathy.     Left upper body: No supraclavicular or axillary adenopathy.     Lower Body: No right inguinal adenopathy. No left inguinal adenopathy.  Skin:    General: Skin is warm.     Coloration: Skin is not jaundiced.     Findings: No lesion or rash.  Neurological:     General: No focal deficit present.     Mental Status: She is alert and oriented to person, place, and time. Mental status is at baseline.  Psychiatric:        Mood and Affect: Mood normal.        Behavior: Behavior normal.  Thought Content: Thought content normal.   LABS:   CBC Latest Ref Rng & Units 03/15/2021 02/15/2021 02/15/2021  WBC - 4.4 - -  Hemoglobin 12.0 - 16.0 13.7 9.9(L) 10.5(L)  Hematocrit 36 - 46 40 29.0(L) 31.0(L)  Platelets 150 - 399 91(A) - -   CMP Latest Ref Rng & Units  03/15/2021 02/15/2021 02/15/2021  Glucose 65 - 99 mg/dL - - -  BUN 4 - 21 16 - -  Creatinine 0.5 - 1.1 1.2(A) - -  Sodium 137 - 147 139 144 145  Potassium 3.4 - 5.3 3.8 3.5 3.5  Chloride 99 - 108 107 - -  CO2 13 - 22 23(A) - -  Calcium 8.7 - 10.7 10.8(A) - -  Total Protein 6.0 - 8.5 g/dL - - -  Total Bilirubin 0.0 - 1.2 mg/dL - - -  Alkaline Phos 25 - 125 97 - -  AST 13 - 35 59(A) - -  ALT 7 - 35 26 - -   ASSESSMENT & PLAN:  A 65 y.o. female who I was asked to consult upon for thrombocythemia.  Her platelet count of 91 today is only mildly low. I will check her B12 and folate studies today to ensure there are no nutritional deficiencies factoring into her thrombocytopenia.  As her AST is elevated, my concern is she may have underlying fatty liver disease and splenomegaly factoring into her low platelets.  On another note, I am pleased as her hemoglobin has significantly improved over the past month without any obvious intervention.  As her thrombocytopenia is mild, it can be followed conservatively for now.  I will see her back in 1 month to go over her labs, scans and their implications.  The patient understands all the plans discussed today and is in agreement with them.  I do appreciate Ronita Hipps, MD for his new consult.   Channie Bostick Macarthur Critchley, MD

## 2021-03-15 ENCOUNTER — Other Ambulatory Visit: Payer: Self-pay | Admitting: Oncology

## 2021-03-15 ENCOUNTER — Telehealth: Payer: Self-pay | Admitting: Oncology

## 2021-03-15 ENCOUNTER — Inpatient Hospital Stay: Payer: HMO | Attending: Oncology | Admitting: Oncology

## 2021-03-15 ENCOUNTER — Other Ambulatory Visit: Payer: Self-pay | Admitting: Hematology and Oncology

## 2021-03-15 ENCOUNTER — Inpatient Hospital Stay: Payer: HMO

## 2021-03-15 DIAGNOSIS — Z79899 Other long term (current) drug therapy: Secondary | ICD-10-CM | POA: Insufficient documentation

## 2021-03-15 DIAGNOSIS — D696 Thrombocytopenia, unspecified: Secondary | ICD-10-CM | POA: Diagnosis not present

## 2021-03-15 DIAGNOSIS — M797 Fibromyalgia: Secondary | ICD-10-CM | POA: Insufficient documentation

## 2021-03-15 DIAGNOSIS — I10 Essential (primary) hypertension: Secondary | ICD-10-CM | POA: Diagnosis not present

## 2021-03-15 DIAGNOSIS — K219 Gastro-esophageal reflux disease without esophagitis: Secondary | ICD-10-CM | POA: Insufficient documentation

## 2021-03-15 DIAGNOSIS — Z7951 Long term (current) use of inhaled steroids: Secondary | ICD-10-CM | POA: Diagnosis not present

## 2021-03-15 DIAGNOSIS — E1136 Type 2 diabetes mellitus with diabetic cataract: Secondary | ICD-10-CM | POA: Insufficient documentation

## 2021-03-15 DIAGNOSIS — D61818 Other pancytopenia: Secondary | ICD-10-CM | POA: Diagnosis not present

## 2021-03-15 DIAGNOSIS — Z86010 Personal history of colonic polyps: Secondary | ICD-10-CM | POA: Diagnosis not present

## 2021-03-15 DIAGNOSIS — R748 Abnormal levels of other serum enzymes: Secondary | ICD-10-CM | POA: Diagnosis not present

## 2021-03-15 DIAGNOSIS — G4730 Sleep apnea, unspecified: Secondary | ICD-10-CM | POA: Insufficient documentation

## 2021-03-15 DIAGNOSIS — E039 Hypothyroidism, unspecified: Secondary | ICD-10-CM | POA: Diagnosis not present

## 2021-03-15 DIAGNOSIS — D649 Anemia, unspecified: Secondary | ICD-10-CM | POA: Diagnosis not present

## 2021-03-15 DIAGNOSIS — Z7982 Long term (current) use of aspirin: Secondary | ICD-10-CM | POA: Insufficient documentation

## 2021-03-15 LAB — BASIC METABOLIC PANEL
BUN: 16 (ref 4–21)
CO2: 23 — AB (ref 13–22)
Chloride: 107 (ref 99–108)
Creatinine: 1.2 — AB (ref 0.5–1.1)
Glucose: 79
Potassium: 3.8 (ref 3.4–5.3)
Sodium: 139 (ref 137–147)

## 2021-03-15 LAB — CBC AND DIFFERENTIAL
HCT: 40 (ref 36–46)
Hemoglobin: 13.7 (ref 12.0–16.0)
Neutrophils Absolute: 1.94
Platelets: 91 — AB (ref 150–399)
WBC: 4.4

## 2021-03-15 LAB — FOLATE: Folate: 16.9 ng/mL (ref >5.9)

## 2021-03-15 LAB — COMPREHENSIVE METABOLIC PANEL
Albumin: 3.9 (ref 3.5–5.0)
Calcium: 10.8 — AB (ref 8.7–10.7)

## 2021-03-15 LAB — CBC
MCV: 94 (ref 81–99)
RBC: 4.27 (ref 3.87–5.11)

## 2021-03-15 LAB — VITAMIN B12: Vitamin B-12: 2027 pg/mL — ABNORMAL HIGH (ref 180–914)

## 2021-03-15 LAB — HEPATIC FUNCTION PANEL
ALT: 26 (ref 7–35)
AST: 59 — AB (ref 13–35)
Alkaline Phosphatase: 97 (ref 25–125)
Bilirubin, Total: 1

## 2021-03-15 LAB — TSH: TSH: 4.238 u[IU]/mL (ref 0.350–4.500)

## 2021-03-15 NOTE — Telephone Encounter (Signed)
Per 10/11 LOS, patient scheduled for Nov Appt's.  Gave patient Appt Summary

## 2021-03-17 ENCOUNTER — Telehealth: Payer: Self-pay

## 2021-03-17 NOTE — Telephone Encounter (Addendum)
03/28/21 @ 1657- Office note incomplete.  10/20 @ 1235- Office note still incomplete. I did mention this to Dr Bobby Rumpf yesterday.  10/18 @ 6219 - I requested Dr Bobby Rumpf to complete office note, from 03/15/21, so that I could send it to Dr Einar Gip as requested.  10/14 - I notified pt that once Dr Bobby Rumpf' note is completed I will send it to Dr Einar Gip. She also asked how soon she should see her PCP regarding her parathyroid?   10/13- Dr Bobby Rumpf office note is not available @ this time. I will notify Dr Einar Gip when it is complete.

## 2021-03-21 ENCOUNTER — Ambulatory Visit: Payer: PPO | Admitting: Cardiology

## 2021-03-25 ENCOUNTER — Other Ambulatory Visit: Payer: Self-pay | Admitting: Cardiology

## 2021-03-25 DIAGNOSIS — E7800 Pure hypercholesterolemia, unspecified: Secondary | ICD-10-CM

## 2021-04-01 DIAGNOSIS — E119 Type 2 diabetes mellitus without complications: Secondary | ICD-10-CM | POA: Diagnosis not present

## 2021-04-01 DIAGNOSIS — E039 Hypothyroidism, unspecified: Secondary | ICD-10-CM | POA: Diagnosis not present

## 2021-04-01 DIAGNOSIS — E041 Nontoxic single thyroid nodule: Secondary | ICD-10-CM | POA: Diagnosis not present

## 2021-04-01 DIAGNOSIS — E559 Vitamin D deficiency, unspecified: Secondary | ICD-10-CM | POA: Diagnosis not present

## 2021-04-03 DIAGNOSIS — D696 Thrombocytopenia, unspecified: Secondary | ICD-10-CM | POA: Insufficient documentation

## 2021-04-08 NOTE — Progress Notes (Signed)
[facility]  [address] [place],  [place]  [address] [phone]  Clinic Day:  04/15/2021  Referring physician: Ronita Hipps, MD  This document serves as a record of services personally performed by Dequincy Macarthur Critchley, MD. It was created on their behalf by [facility] E, a trained medical scribe. The creation of this record is based on the scribe's personal observations and the provider's statements to them.  HISTORY OF PRESENT ILLNESS:  The patient is a 65 y.o. female  who I recently began seeing for thrombocytopenia.  Furthermore, recent labs showed one of her liver enzymes being elevated.  She comes in today to go over recent scans to ensure her thrombocytopenia is not due to underlying liver disease.  Since her last visit, the patient has been doing fairly well.  She continues to deny having any underlying bleeding/bruising issues which concern her for severe thrombocytopenia being present.    PHYSICAL EXAM:  Blood pressure (!) 155/71, pulse 72, temperature 97.9 F (36.6 C), resp. rate 20, height 5\' 7"  (1.702 m), weight (!) 303 lb 12.8 oz (137.8 kg), SpO2 96 %. Wt Readings from Last 3 Encounters:  04/15/21 (!) 303 lb 12.8 oz (137.8 kg)  03/15/21 293 lb 3.2 oz (133 kg)  03/02/21 (!) 315 lb 12.8 oz (143.2 kg)   Body mass index is 47.58 kg/m. Performance status (ECOG): 1 - Symptomatic but completely ambulatory Physical Exam Constitutional:      Appearance: Normal appearance. She is not ill-appearing.  HENT:     Mouth/Throat:     Mouth: Mucous membranes are moist.     Pharynx: Oropharynx is clear. No oropharyngeal exudate or posterior oropharyngeal erythema.  Cardiovascular:     Rate and Rhythm: Normal rate and regular rhythm.     Heart sounds: No murmur heard.   No friction rub. No gallop.  Pulmonary:     Effort: Pulmonary effort is normal. No respiratory distress.     Breath sounds: Normal breath sounds. No wheezing, rhonchi or rales.   Abdominal:     General: Bowel sounds are normal. There is no distension.     Palpations: Abdomen is soft. There is no mass.     Tenderness: There is no abdominal tenderness.  Musculoskeletal:        General: No swelling.     Right lower leg: No edema.     Left lower leg: No edema.  Lymphadenopathy:     Cervical: No cervical adenopathy.     Upper Body:     Right upper body: No supraclavicular or axillary adenopathy.     Left upper body: No supraclavicular or axillary adenopathy.     Lower Body: No right inguinal adenopathy. No left inguinal adenopathy.  Skin:    General: Skin is warm.     Coloration: Skin is not jaundiced.     Findings: No lesion or rash.  Neurological:     General: No focal deficit present.     Mental Status: She is alert and oriented to person, place, and time. Mental status is at baseline.  Psychiatric:        Mood and Affect: Mood normal.        Behavior: Behavior normal.        Thought Content: Thought content normal.   SCANS:  Recent CT scans of his abdomen/pelvis revealed the following: FINDINGS:  VASCULAR  Aorta: There is atherosclerotic calcification of the aorta without  evidence of aneurysm, dissection, stenosis, or vasculitis  Celiac: Atherosclerotic calcification at the ostia resulting in mild  stenosis. No dissection vasculitis, or aneurysm.  SMA: Atherosclerotic calcification at the ostia resulting in mild  stenosis. No dissection, aneurysm, or vasculitis.  Renals: Atherosclerotic calcification at the renal ostia bilaterally  resulting in mild stenosis. No aneurysm, dissection, or vasculitis.  IMA: Patent without evidence of aneurysm, dissection, vasculitis or  significant stenosis.  Inflow: Patent without evidence of aneurysm, dissection, vasculitis  or significant stenosis.  Proximal Outflow: Bilateral common femoral and visualized portions  of the superficial and profunda femoral arteries are patent without  evidence of aneurysm,  dissection, vasculitis or significant stenosis.  Veins: The portal, splenic vein, and superior mesenteric vein are  patent. There are multiple dilated venous varices in the  retroperitoneum on the left terminating in the left renal vein,  suggesting gonadal vein varices.  Review of the MIP images confirms the above findings.   NON-VASCULAR  Lower chest: Mild atelectasis or scarring at the lung bases. The  heart is normal in size.  Hepatobiliary: Rim calcified lesions are present in the right lobe  of the liver, not significantly changed from the prior exam. No  biliary ductal dilatation is seen. The gallbladder is surgically  absent.  Pancreas: Mild pancreatic atrophy. No pancreatic ductal dilatation  is seen.  Spleen: Normal in size without focal abnormality.  Adrenals/Urinary Tract: The adrenal glands are within normal limits.  No renal calculus or hydronephrosis. A stable cyst is noted in the  left renal sinus. The kidneys enhance symmetrically. No ureteral  calculus or obstructive uropathy. The urinary bladder is  decompressed.  Stomach/Bowel: There is a small hiatal hernia and the stomach is  nondistended. No bowel obstruction, free air or pneumatosis.  Scattered diverticula are noted along the colon without evidence of  diverticulitis. The appendix is not visualized on exam, however  there are no inflammatory changes in the right lower quadrant. No  focal bowel wall thickening is identified.  Lymphatic: No abdominal or pelvic lymphadenopathy  Reproductive: The uterus is surgically absent.  Other: No free fluid in the pelvis.  Musculoskeletal: Degenerative changes are present in the thoracic  spine. No acute osseous abnormality is seen.   IMPRESSION:  VASCULAR  1. No evidence of significant arterial stenosis, dissection, or  aneurysm.  2. 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  NON-VASCULAR  1. Diverticulosis  without diverticulitis.  2. Small hiatal hernia.  3. Left renal cyst.   LABS:   ASSESSMENT & PLAN:  A 65 y.o. female who I was asked to consult upon for thrombocythemia.  In clinic today, I went over her scans with her, for which she could see she has no underlying cirrhosis.  Other labs done did not elucidate an obvious etiology behind her thrombocytopenia.  More than likely, she has ITP.  However, her platelet count remains mild and stable.  Based upon this, she will continue to be followed conservatively.  I will see her back in 6 months for repeat clinical assessment.  The patient understands all the plans discussed today and is in agreement with them.  I, Rita Ohara, am acting as scribe for Marice Potter, MD    I have reviewed this report as typed by the medical scribe, and it is complete and accurate.  Dequincy Macarthur Critchley, MD

## 2021-04-12 DIAGNOSIS — J40 Bronchitis, not specified as acute or chronic: Secondary | ICD-10-CM | POA: Diagnosis not present

## 2021-04-12 DIAGNOSIS — J329 Chronic sinusitis, unspecified: Secondary | ICD-10-CM | POA: Diagnosis not present

## 2021-04-13 DIAGNOSIS — K922 Gastrointestinal hemorrhage, unspecified: Secondary | ICD-10-CM | POA: Diagnosis not present

## 2021-04-13 DIAGNOSIS — K449 Diaphragmatic hernia without obstruction or gangrene: Secondary | ICD-10-CM | POA: Diagnosis not present

## 2021-04-13 DIAGNOSIS — R197 Diarrhea, unspecified: Secondary | ICD-10-CM | POA: Diagnosis not present

## 2021-04-13 DIAGNOSIS — K5730 Diverticulosis of large intestine without perforation or abscess without bleeding: Secondary | ICD-10-CM | POA: Diagnosis not present

## 2021-04-14 ENCOUNTER — Telehealth: Payer: Self-pay

## 2021-04-14 NOTE — Telephone Encounter (Addendum)
@   1129: Pt notified of below and to arrive at clinic @ 215p.   I discussed below with Dr Bobby Rumpf. He reviewed scans, states she doesn't need to come for more scans today.    Pt was encouraged by PCP to go to ER last night because of blood in stools. ER did scans and labs. Pt wants to ow if she still needs to keep appointment for scans today or not?  ER did CT/CTA abdomen & pelvis.  I notified Dr Bobby Rumpf @ [phone] of above, he will call me back.

## 2021-04-15 ENCOUNTER — Ambulatory Visit: Payer: HMO | Admitting: Oncology

## 2021-04-15 ENCOUNTER — Other Ambulatory Visit: Payer: HMO

## 2021-04-15 ENCOUNTER — Other Ambulatory Visit: Payer: Self-pay | Admitting: Oncology

## 2021-04-15 ENCOUNTER — Inpatient Hospital Stay: Payer: HMO | Attending: Oncology | Admitting: Oncology

## 2021-04-15 VITALS — BP 155/71 | HR 72 | Temp 97.9°F | Resp 20 | Ht 67.0 in | Wt 303.8 lb

## 2021-04-15 DIAGNOSIS — D696 Thrombocytopenia, unspecified: Secondary | ICD-10-CM

## 2021-04-20 DIAGNOSIS — Z6841 Body Mass Index (BMI) 40.0 and over, adult: Secondary | ICD-10-CM | POA: Diagnosis not present

## 2021-04-20 DIAGNOSIS — J180 Bronchopneumonia, unspecified organism: Secondary | ICD-10-CM | POA: Diagnosis not present

## 2021-04-25 DIAGNOSIS — E041 Nontoxic single thyroid nodule: Secondary | ICD-10-CM | POA: Diagnosis not present

## 2021-04-29 ENCOUNTER — Encounter: Payer: Self-pay | Admitting: Cardiology

## 2021-05-02 NOTE — Telephone Encounter (Signed)
From pt

## 2021-06-03 DIAGNOSIS — L219 Seborrheic dermatitis, unspecified: Secondary | ICD-10-CM | POA: Diagnosis not present

## 2021-06-03 DIAGNOSIS — L728 Other follicular cysts of the skin and subcutaneous tissue: Secondary | ICD-10-CM | POA: Diagnosis not present

## 2021-06-03 DIAGNOSIS — L820 Inflamed seborrheic keratosis: Secondary | ICD-10-CM | POA: Diagnosis not present

## 2021-07-22 DIAGNOSIS — L219 Seborrheic dermatitis, unspecified: Secondary | ICD-10-CM | POA: Diagnosis not present

## 2021-07-22 DIAGNOSIS — L650 Telogen effluvium: Secondary | ICD-10-CM | POA: Diagnosis not present

## 2021-07-22 DIAGNOSIS — L820 Inflamed seborrheic keratosis: Secondary | ICD-10-CM | POA: Diagnosis not present

## 2021-08-02 ENCOUNTER — Telehealth: Payer: Self-pay

## 2021-08-02 NOTE — Telephone Encounter (Signed)
Pt was never set up on CPAP. Not truly sure if it was all DME fault. Please forward orders to [facility]. Can someone call patient to let her know that this was done and who to expect call from? Thanks!

## 2021-08-02 NOTE — Telephone Encounter (Signed)
Order and supporting info has been faxed to [facility]. Received a receipt of confirmation. Spoke with patient and discussed. Pt amenable to proceed with [facility]. Advised I would send their information to her mychart. Pt scheduled initial follow-up for Monday May 8th at 1:15 pm. Aware of compliance requirements. Pt verbalized appreciation.   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

## 2021-08-03 DIAGNOSIS — E559 Vitamin D deficiency, unspecified: Secondary | ICD-10-CM | POA: Diagnosis not present

## 2021-08-03 DIAGNOSIS — E119 Type 2 diabetes mellitus without complications: Secondary | ICD-10-CM | POA: Diagnosis not present

## 2021-08-03 DIAGNOSIS — E039 Hypothyroidism, unspecified: Secondary | ICD-10-CM | POA: Diagnosis not present

## 2021-08-03 DIAGNOSIS — E213 Hyperparathyroidism, unspecified: Secondary | ICD-10-CM | POA: Diagnosis not present

## 2021-08-03 DIAGNOSIS — E041 Nontoxic single thyroid nodule: Secondary | ICD-10-CM | POA: Diagnosis not present

## 2021-08-03 DIAGNOSIS — Z79890 Hormone replacement therapy (postmenopausal): Secondary | ICD-10-CM | POA: Diagnosis not present

## 2021-08-15 NOTE — Telephone Encounter (Signed)
Noted that [facility] was needing a missing face to face notes one year prior to the sleep study.  This was faxed to them this am with fax confirmation received [phone].  ?

## 2021-08-29 ENCOUNTER — Telehealth: Payer: Self-pay | Admitting: Neurology

## 2021-08-29 NOTE — Telephone Encounter (Signed)
Pt said, DME company request a face to face note within a year. Would like a call from the nurse. ?

## 2021-08-29 NOTE — Telephone Encounter (Signed)
Spoke with Dominica Severin at [facility].  They have not been able to provide patient with a CPAP machine yet because she needs a face-to-face visit.  I let him know patient scheduled for an appointment in May but our office would reach out to her and see if we have anything available sooner. ?

## 2021-08-31 ENCOUNTER — Encounter: Payer: Self-pay | Admitting: Cardiology

## 2021-08-31 ENCOUNTER — Ambulatory Visit: Payer: HMO | Admitting: Cardiology

## 2021-08-31 VITALS — BP 140/62 | HR 98 | Temp 98.0°F | Resp 12 | Ht 67.0 in | Wt 304.6 lb

## 2021-08-31 DIAGNOSIS — I209 Angina pectoris, unspecified: Secondary | ICD-10-CM | POA: Diagnosis not present

## 2021-08-31 DIAGNOSIS — I97638 Postprocedural hematoma of a circulatory system organ or structure following other circulatory system procedure: Secondary | ICD-10-CM | POA: Diagnosis not present

## 2021-08-31 DIAGNOSIS — I351 Nonrheumatic aortic (valve) insufficiency: Secondary | ICD-10-CM | POA: Diagnosis not present

## 2021-08-31 DIAGNOSIS — I729 Aneurysm of unspecified site: Secondary | ICD-10-CM | POA: Diagnosis not present

## 2021-08-31 NOTE — Progress Notes (Signed)
? ?Primary Physician/Referring:  Ronita Hipps, MD ? ?Patient ID: Joanna Alvarado, female    DOB: 10-31-1955, 66 y.o.   MRN: [id number] ? ?Chief Complaint  ?Patient presents with  ? aortic regurgitation  ? ?HPI:   ? ?Joanna Alvarado  is a 66 y.o. female with a past medical history of hypertension, controlled diabetes mellitus, now only has hyperglycemia with weight loss hypercholesteremia, morbid obesity with sleep apnea using CPAP at night, GERD, hiatal hernia, fibromyalgia, hypothyroidism and chronic stable angina with normal coronary arteries/minimal coronary artery disease by angiography in April 2021. ? ? Patient made an appointment to see me due to anginal symptoms getting worse over the past 4 to 5 days.  Easily relieved with nitroglycerin.  She has also noticed mild dyspnea on exertion that is slightly worse than chronic, no leg edema. ? ?Past Medical History:  ?Diagnosis Date  ? Abnormal myocardial perfusion study 09/26/2019  ? Anemia   ? Angina pectoris ([place]) - Class II-III 09/26/2019  ? Arthritis   ? KNEES  ? Benign neoplasm of ascending colon   ? Benign neoplasm of cecum   ? Benign neoplasm of descending colon   ? Benign neoplasm of sigmoid colon   ? Benign neoplasm of transverse colon   ? Bowel habit changes   ? Cataract   ? BILATERAL-REMOVED  ? Chronic postoperative pain 12/02/2012  ? Diabetes mellitus without complication ([place])   ? Disturbance of skin sensation 12/02/2012  ? Fibromyalgia   ? GERD (gastroesophageal reflux disease)   ? History of colonic polyps   ? Hypertension   ? Hypothyroidism   ? Myalgia and myositis, unspecified 12/02/2012  ? Personal history of colonic polyps   ? Primary hypothyroidism 11/30/2015  ? Senile nuclear sclerosis 04/24/2014  ? Sleep apnea   ? uses c-pap  ? Thyroid disease   ? Thyroid nodule 11/30/2015  ? Type 2 diabetes mellitus without complications ([place]) 03/47/4259  ? Vitamin D deficiency 11/30/2015  ? ? ?Social History  ? ?Tobacco Use  ? Smoking status: Never  ? Smokeless  tobacco: Never  ?Substance Use Topics  ? Alcohol use: Never  ? ?Marital Status: Married  ? ?ROS  ?Review of Systems  ?Cardiovascular:  Positive for chest pain and dyspnea on exertion. Negative for leg swelling and orthopnea.  ?Respiratory:  Positive for snoring (on cpap).   ?Musculoskeletal:  Positive for arthritis and back pain.  ?Objective  ?Blood pressure 140/62, pulse 98, temperature 98 ?F (36.7 ?C), temperature source Temporal, resp. rate 12, height '5\' 7"'$  (1.702 m), weight (!) 304 lb 9.6 oz (138.2 kg), SpO2 97 %.  ? ?  08/31/2021  ?  1:18 PM 04/15/2021  ?  2:41 PM 03/15/2021  ?  4:10 PM  ?Vitals with BMI  ?Height '5\' 7"'$  '5\' 7"'$  '5\' 7"'$   ?Weight 304 lbs 10 oz 303 lbs 13 oz 293 lbs 3 oz  ?BMI 47.7 47.57 45.91  ?Systolic 563 875 643  ?Diastolic 62 71 71  ?Pulse 98 72 70  ?  ? Physical Exam ?Constitutional:   ?   Comments: Morbidly obese. No acute distress.  ?Neck:  ?   Vascular: No JVD.  ?Cardiovascular:  ?   Rate and Rhythm: Normal rate and regular rhythm.  ?   Pulses:     ?     Carotid pulses are 2+ on the right side with bruit and 2+ on the left side with bruit. ?     Radial pulses are 2+  on the right side and 2+ on the left side.  ?     Dorsalis pedis pulses are 2+ on the right side and 2+ on the left side.  ?     Posterior tibial pulses are 2+ on the right side and 2+ on the left side.  ?   Heart sounds: Murmur heard.  ?High-pitched blowing decrescendo early diastolic murmur is present with a grade of 2/4 at the upper right sternal border radiating to the apex.  ?   Comments: Popliteal pulses difficult to palpate due to patient body habitus. ?Pulmonary:  ?   Effort: Pulmonary effort is normal.  ?   Breath sounds: Normal breath sounds.  ?Abdominal:  ?   General: Bowel sounds are normal.  ?   Palpations: Abdomen is soft.  ?   Comments: Large pannus  ?Musculoskeletal:  ?   Right lower leg: No edema.  ?   Left lower leg: No edema.  ? ?Laboratory examination:  ? ?Recent Labs  ?  01/21/21 ?1036 02/15/21 ?1002  02/15/21 ?1010 02/15/21 ?1012 03/15/21 ?0000  ?NA 144   < > 145 144 139  ?K 3.8   < > 3.5 3.5 3.8  ?CL 108*  --   --   --  107  ?CO2 20  --   --   --  23*  ?GLUCOSE 80  --   --   --   --   ?BUN 22  --   --   --  16  ?CREATININE 1.42*  --   --   --  1.2*  ?CALCIUM 10.4*  --   --   --  10.8*  ? < > = values in this interval not displayed.  ? ? ?CrCl cannot be calculated (Patient's most recent lab result is older than the maximum 21 days allowed.).  ? ?  Latest Ref Rng & Units 03/15/2021  ? 12:00 AM 02/15/2021  ? 10:12 AM 02/15/2021  ? 10:10 AM  ?CMP  ?BUN 4 - 21 16         ?Creatinine 0.5 - 1.1 1.2         ?Sodium 137 - 147 139      144   145    ?Potassium 3.4 - 5.3 3.8      3.5   3.5    ?Chloride 99 - 108 107         ?CO2 13 - 22 23         ?Calcium 8.7 - 10.7 10.8         ?Alkaline Phos 25 - 125 97         ?AST 13 - 35 59         ?ALT 7 - 35 26         ?  ? This result is from an external source.  ? ? ?  Latest Ref Rng & Units 03/15/2021  ? 12:00 AM 02/15/2021  ? 10:12 AM 02/15/2021  ? 10:10 AM  ?CBC  ?WBC  4.4         ?Hemoglobin 12.0 - 16.0 13.7      9.9   10.5    ?Hematocrit 36 - 46 40      29.0   31.0    ?Platelets 150 - 399 91         ?  ? This result is from an external source.  ? ? ?Lipid Panel ?No results for input(s): CHOL, TRIG,  LDLCALC, VLDL, HDL, CHOLHDL, LDLDIRECT in the last 8760 hours. ? ?External labs:  ? ? ?Labs 04/01/2020: ? ?A1c 5.5%.  TSH normal.  Free T4 minimally elevated.  Vitamin D normal at 36. ? ?A1C 6.7% 11/2019 ? ?Medications and allergies  ? ?Allergies  ?Allergen Reactions  ? Metamucil [Psyllium] Other (See Comments)  ?  Severe constipation  ? Trulicity [Dulaglutide]   ?  KIDNEY INFECTION,INABILITY TO HAVE BOWEL MOVEMENTS,BLOOD IN URINE  ? Adhesive [Tape]   ?  Latex, band aids  ?causes whelts and blisters  ? Hydrocodone-Acetaminophen Itching  ?  Tolerates small/ infrequent doses  ? Oxycontin [Oxycodone Hcl]   ?  "FELT LIKE HEAD WAS GOING TO EXPLODE"  ? Ciprofloxacin Rash  ?  Yeast infection  and a severe rash   ? Codeine Itching and Rash  ? Other Palpitations  ?  "makes me feel like I'm having a heart attack. -- Steroids   ?  ? ?Current Outpatient Medications:  ?  acetaminophen (TYLENOL) 500 MG tablet, Take 1,000 mg by mouth every 6 (six) hours as needed for moderate pain., Disp: , Rfl:  ?  aspirin EC 81 MG tablet, Take 81 mg by mouth daily., Disp: , Rfl:  ?  budesonide-formoterol (SYMBICORT) 80-4.5 MCG/ACT inhaler, Inhale 2 puffs into the lungs 2 (two) times daily as needed (shortness of breath). , Disp: , Rfl:  ?  diclofenac (CATAFLAM) 50 MG tablet, Take 50 mg by mouth daily. , Disp: , Rfl:  ?  ergocalciferol (VITAMIN D2) 1.25 MG (50000 UT) capsule, Take 1 capsule by mouth once a week., Disp: , Rfl:  ?  famotidine (PEPCID) 40 MG tablet, Take 40 mg by mouth at bedtime., Disp: , Rfl:  ?  hydrochlorothiazide (MICROZIDE) 12.5 MG capsule, Take 1 capsule (12.5 mg total) by mouth daily., Disp: 90 capsule, Rfl: 3 ?  isosorbide mononitrate (IMDUR) 120 MG 24 hr tablet, Take 1 tablet (120 mg total) by mouth daily., Disp: 90 tablet, Rfl: 3 ?  levothyroxine (SYNTHROID, LEVOTHROID) 175 MCG tablet, Take 175-262.5 mcg by mouth See admin instructions. Take 262.5 mcg  on Monday and Friday Tale 175 mcg on Tuesday, Wednesday, Thursday, Saturday, and Sunday, Disp: , Rfl:  ?  linaclotide (LINZESS) 290 MCG CAPS capsule, Take 290 mcg by mouth daily before breakfast., Disp: , Rfl:  ?  losartan (COZAAR) 50 MG tablet, Take 50 mg by mouth daily., Disp: , Rfl:  ?  nitroGLYCERIN (NITROSTAT) 0.4 MG SL tablet, Place 0.4 mg under the tongue every 5 (five) minutes as needed for chest pain., Disp: , Rfl:  ?  omeprazole (PRILOSEC) 40 MG capsule, Take 40 mg by mouth daily., Disp: , Rfl:  ?  polyethylene glycol (MIRALAX / GLYCOLAX) 17 g packet, Take 17 g by mouth daily., Disp: , Rfl:  ?  rosuvastatin (CRESTOR) 5 MG tablet, TAKE ONE TABLET BY MOUTH DAILY, Disp: 90 tablet, Rfl: 3  ? ?Radiology:  ? ?Chest X-ray 2 View 07/04/2019 ?Faint  opacity within the peripheral aspect of the left lower lobe ?could reflect a small developing infiltrate. Lungs are otherwise ?clear. ? ?Cardiac Studies:  ? ?ZIO Monitor 7 days 09/17/2019 ?ZIO monitor was performed for

## 2021-09-06 ENCOUNTER — Ambulatory Visit: Payer: HMO

## 2021-09-06 DIAGNOSIS — I351 Nonrheumatic aortic (valve) insufficiency: Secondary | ICD-10-CM

## 2021-09-06 DIAGNOSIS — I209 Angina pectoris, unspecified: Secondary | ICD-10-CM

## 2021-09-06 DIAGNOSIS — I77810 Thoracic aortic ectasia: Secondary | ICD-10-CM

## 2021-09-06 NOTE — Telephone Encounter (Signed)
Spoke with patient and got her scheduled with Janett Billow NP this coming Monday 4/10 @ 1:15 PM arrival 15-30 minutes early. ?

## 2021-09-06 NOTE — Telephone Encounter (Signed)
Pt was called, phone rep offered her available time slots on 04-11 and and another date this month with open slots.  Pt has something to do on both dates.  Pt was advised this would be relayed back to RN.  ?

## 2021-09-09 NOTE — Telephone Encounter (Signed)
Pt called [facility] was told that they will check to see if they have machine and would call pt back. ?

## 2021-09-12 ENCOUNTER — Encounter: Payer: Self-pay | Admitting: Adult Health

## 2021-09-12 ENCOUNTER — Ambulatory Visit: Payer: HMO | Admitting: Adult Health

## 2021-09-12 ENCOUNTER — Encounter: Payer: Self-pay | Admitting: Cardiology

## 2021-09-12 VITALS — BP 122/58 | HR 79 | Ht 67.0 in | Wt 307.0 lb

## 2021-09-12 DIAGNOSIS — G4733 Obstructive sleep apnea (adult) (pediatric): Secondary | ICD-10-CM | POA: Diagnosis not present

## 2021-09-12 DIAGNOSIS — Z9989 Dependence on other enabling machines and devices: Secondary | ICD-10-CM

## 2021-09-12 DIAGNOSIS — R55 Syncope and collapse: Secondary | ICD-10-CM

## 2021-09-12 DIAGNOSIS — I77810 Thoracic aortic ectasia: Secondary | ICD-10-CM

## 2021-09-12 DIAGNOSIS — I351 Nonrheumatic aortic (valve) insufficiency: Secondary | ICD-10-CM

## 2021-09-12 NOTE — Progress Notes (Signed)
?[facility] ?[address] ?. [place] [address] ?[phone] ? ?     OFFICE FOLLOW UP NOTE ? ?Joanna Alvarado ?Date of Birth:  [birth year] ?Medical Record Number:  [id number]  ? ?Reason for visit: CPAP follow-up ? ? ? ?SUBJECTIVE: ? ? ?CHIEF COMPLAINT:  ?Chief Complaint  ?Patient presents with  ? Follow-up  ?  Rm 3 with husband here for f/u CPAP   ? ? ?HPI:  ? ? ?Update 09/12/2021 JM: Completed HST 06/2020 which showed overall mild OSA with total AHI 5.4/h and O2 nadir of 86%.  Recommended continued use of CPAP with plans on obtaining a new CPAP machine. She cancelled prior scheduled follow-up visit with Joanna Alvarado in 10/2020.  She has not yet been able to obtain a new CPAP machine as insurance is requiring a face-to-face visit.  She is currently using her old machine with last 30-day compliance report reviewed which showed 30 out of 30 usage days with 30 days greater than 4 hours for 100% compliance.  Average usage 9 hours and 18 minutes.  Residual AHI 0.1 on set pressure 9.  Leaks in the 95th percentile 18.0. ? ?Reports not liking CPAP but uses it nightly. She has been having some issues tolerating mask - she reached out to DME company but was told she could not obtain a new mask until she receives a new CPAP machine.  Reports occasional leaks from mask especially when turning from side to side and current straps are uncomfortable.  ? ?No further apnea related concerns at this time. ? ? ? ?History provided for reference purposes only ?Consult visit 05/20/2020 Joanna Alvarado:  ?Joanna Alvarado is a 66 year old right-handed woman with an underlying medical history of vitamin D deficiency, thyroid disease, hypothyroidism, hypertension, reflux disease, intermittent, aortic valve insufficiency, with moderate to severe aortic regurgitation, anemia, arthritis, and morbid obesity with a BMI of over 50, who was previously diagnosed with obstructive sleep apnea and placed on CPAP therapy.  Prior sleep study  results are not available for my review today.  Her original sleep studies were probably 15 years ago.  She reports having to studies back to back, likely diagnostic followed by a titration study.  Her current machine was given to her some 5 years ago after she had another set of studies through Dr. Alcide Alvarado in [place].  Her DME company was [place] [facility].  She has not received supplies in the recent months or even years, she needs new supplies including a new headgear.  She uses nasal pillows.  Sometimes she has nostril irritation on the left side.  Of note, she fractured her nose some 20 years ago and has subsequently had a significant deviated septum and nasal deformity.  ?  ?I reviewed your office note from 04/19/2020.  Her Epworth sleepiness score is 1 out of 24 today, fatigue severity score is 63 out of 63.  She feels easily short of breath with mild exertion, even just walking from the parking lot to the clinic today.  She has been consistent with her CPAP.  She typically does not skip the night.  She has a variable sleep schedule depending on various factors, often she helps take care of her grandbabies.  She goes to bed anywhere between 9 PM and 1 AM and rise time is anywhere between 5 AM and 9 AM.  I was able to review her CPAP compliance data from 04/20/2020 through 05/19/2020, which is a total of 30 days, during which  time she used her machine every night with percent use days greater than 4 hours at 100%, indicating superb compliance, average usage of 9 hours and 17 minutes, residual AHI at goal at 0.4/h, leak acceptable with a 95th percentile at 13.3 L/min on a pressure of 9 cm.  She reports that she could not tolerate the excess humidity and has reduced the humidity setting from 4 down to 2. ?She is working on weight loss.  She has lost some weight thus far.  She may need aortic valve replacement eventually.  She has a follow-up appointment with you in mid February. ?  ?She is married and lives  with her husband.  She has a son and a daughter and 2 stepchildren.  She is retired, she worked as a [employer] representative.  She had a tonsillectomy and appendectomy at age 71.  She has undergone multiple other surgeries.  She also had significant left foot injury and surgery as well as hardware in place. ?  ?He is a non-smoker and does not drink alcohol and drinks caffeine in the form of coffee, 1 cup/day on average. ? ? ? ? ? ? ?ROS:   ?14 system review of systems performed and negative with exception of those listed in HPI ? ?PMH:  ?Past Medical History:  ?Diagnosis Date  ? Abnormal myocardial perfusion study 09/26/2019  ? Anemia   ? Angina pectoris ([place]) - Class II-III 09/26/2019  ? Arthritis   ? KNEES  ? Benign neoplasm of ascending colon   ? Benign neoplasm of cecum   ? Benign neoplasm of descending colon   ? Benign neoplasm of sigmoid colon   ? Benign neoplasm of transverse colon   ? Bowel habit changes   ? Cataract   ? BILATERAL-REMOVED  ? Chronic postoperative pain 12/02/2012  ? Diabetes mellitus without complication ([place])   ? Disturbance of skin sensation 12/02/2012  ? Fibromyalgia   ? GERD (gastroesophageal reflux disease)   ? History of colonic polyps   ? Hypertension   ? Hypothyroidism   ? Myalgia and myositis, unspecified 12/02/2012  ? Personal history of colonic polyps   ? Primary hypothyroidism 11/30/2015  ? Senile nuclear sclerosis 04/24/2014  ? Sleep apnea   ? uses c-pap  ? Thyroid disease   ? Thyroid nodule 11/30/2015  ? Type 2 diabetes mellitus without complications ([place]) 67/59/1638  ? Vitamin D deficiency 11/30/2015  ? ? ?PSH:  ?Past Surgical History:  ?Procedure Laterality Date  ? ABDOMINAL HYSTERECTOMY    ? APPENDECTOMY    ? CHOLECYSTECTOMY    ? COLONOSCOPY N/A 07/27/2015  ? Procedure: COLONOSCOPY;  Surgeon: Joanna Shipper, MD;  Location: [facility];  Service: Endoscopy;  Laterality: N/A;  ? COLONOSCOPY WITH PROPOFOL N/A 04/28/2019  ? Procedure: COLONOSCOPY WITH PROPOFOL;  Surgeon: Joanna Shipper, MD;   Location: [facility];  Service: Endoscopy;  Laterality: N/A;  ? EYE SURGERY    ? bilateral cataracts with lens implants  ? floater bone surgery    ? bone removed from right hand  ? FOOT SURGERY    ? RIGHT  ? FOOT SURGERY Left   ? triple orthodesis  ? left foot surgery    ? removed tendon and placed pins in foot  ? LEFT HEART CATH AND CORONARY ANGIOGRAPHY N/A 09/26/2019  ? Procedure: LEFT HEART CATH AND CORONARY ANGIOGRAPHY;  Surgeon: Leonie Man, MD;  Location: [facility];  Service: Cardiovascular;  Laterality: N/A;  ? PLANTAR FASCIA SURGERY    ?  both feet  ? POLYPECTOMY  04/28/2019  ? Procedure: POLYPECTOMY;  Surgeon: Joanna Shipper, MD;  Location: [facility];  Service: Endoscopy;;  ? RIGHT HEART CATH N/A 02/15/2021  ? Procedure: RIGHT HEART CATH;  Surgeon: Nigel Mormon, MD;  Location: [place] [facility];  Service: Cardiovascular;  Laterality: N/A;  ? TONSILLECTOMY    ? ? ?Social History:  ?Social History  ? ?Socioeconomic History  ? Marital status: Married  ?  Spouse name: Not on file  ? Number of children: 2  ? Years of education: Not on file  ? Highest education level: Some college, no degree  ?Occupational History  ?  Comment: retired  ?Tobacco Use  ? Smoking status: Never  ? Smokeless tobacco: Never  ?Vaping Use  ? Vaping Use: Never used  ?Substance and Sexual Activity  ? Alcohol use: Never  ? Drug use: Never  ? Sexual activity: Not on file  ?Other Topics Concern  ? Not on file  ?Social History Narrative  ? Lives with husband  ? caffeine 1 c daily  ? ?Social Determinants of Health  ? ?Financial Resource Strain: Not on file  ?Food Insecurity: Not on file  ?Transportation Needs: Not on file  ?Physical Activity: Not on file  ?Stress: Not on file  ?Social Connections: Not on file  ?Intimate Partner Violence: Not on file  ? ? ?Family History:  ?Family History  ?Problem Relation Age of Onset  ? Heart disease Mother 23  ? Diabetes Mother   ? Hypertension Mother   ? Hypertension Father   ? Colon  polyps Brother   ? Stroke Maternal Grandmother   ? Heart disease Paternal Grandmother   ? Throat cancer Paternal Grandfather   ? Colon cancer Neg Hx   ? Esophageal cancer Neg Hx   ? Rectal cancer Neg

## 2021-09-12 NOTE — Telephone Encounter (Signed)
From patient.

## 2021-09-12 NOTE — Progress Notes (Unsigned)
ICD-10-CM   ?1. Aortic root dilatation (HCC)  I77.810 CT ANGIO CHEST AORTA W/CM & OR WO/CM  ?  Basic metabolic panel  ?  ?2. Moderate to severe aortic valve regurgitation  I35.1 PCV ECHOCARDIOGRAM COMPLETE  ?  ?3. Angina pectoris with normal coronary arteriogram (HCC)  I20.9 PCV ECHOCARDIOGRAM COMPLETE  ?  ? ?Orders Placed This Encounter  ?Procedures  ? CT ANGIO CHEST AORTA W/CM & OR WO/CM  ?  Standing Status:   Future  ?  Standing Expiration Date:   09/13/2022  ?  Order Specific Question:   If indicated for the ordered procedure, I authorize the administration of contrast media per Radiology protocol  ?  Answer:   Yes  ?  Order Specific Question:   Preferred imaging location?  ?  Answer:   [id number]  ? Basic metabolic panel  ? ? ?Adrian Prows, MD, [facility] ?09/12/2021, 9:37 AM ?Office: [phone] ?Fax: [phone] ?Pager: [phone]  ?

## 2021-09-13 NOTE — Telephone Encounter (Signed)
ICD-10-CM   ?1. Moderate to severe aortic valve regurgitation  I35.1 Ambulatory referral to Cardiothoracic Surgery  ?  ?2. Aortic root dilatation [facility])  I77.810 Ambulatory referral to Cardiothoracic Surgery  ?  ?3. Near syncope  R55 Ambulatory referral to Cardiothoracic Surgery  ?  ? ? ?Orders Placed This Encounter  ?Procedures  ? Ambulatory referral to Cardiothoracic Surgery  ?  Referral Priority:   Urgent  ?  Referral Type:   Surgical  ?  Referral Reason:   Specialty Services Required  ?  Requested Specialty:   Cardiothoracic Surgery  ?  Number of Visits Requested:   1  ? ?Decreased losartan from 50 mg to 25 mg daily due to soft blood pressure and near syncope. ?

## 2021-09-19 DIAGNOSIS — I10 Essential (primary) hypertension: Secondary | ICD-10-CM | POA: Diagnosis not present

## 2021-09-19 DIAGNOSIS — G4733 Obstructive sleep apnea (adult) (pediatric): Secondary | ICD-10-CM | POA: Diagnosis not present

## 2021-10-07 ENCOUNTER — Ambulatory Visit
Admission: RE | Admit: 2021-10-07 | Discharge: 2021-10-07 | Disposition: A | Discharge status: Home / Self Care | Payer: HMO | Source: Ambulatory Visit | Attending: Cardiology | Admitting: Cardiology

## 2021-10-07 DIAGNOSIS — I7121 Aneurysm of the ascending aorta, without rupture: Secondary | ICD-10-CM | POA: Diagnosis not present

## 2021-10-07 DIAGNOSIS — I77810 Thoracic aortic ectasia: Secondary | ICD-10-CM

## 2021-10-07 MED ORDER — IOPAMIDOL (ISOVUE-370) INJECTION 76%
75.0000 mL | Freq: Once | INTRAVENOUS | Status: AC | PRN
  Administered 2021-10-07: 75 mL via INTRAVENOUS

## 2021-10-08 NOTE — Progress Notes (Signed)
CT angiogram chest 10/07/2021: ?There is mild aortic atherosclerosis, no dissection. Fusiform aneurysmal dilatation of the ascending aorta, maximal dimension 4.2 cm. Recommend annual imaging followup by CTA or MRA. ?Tiny hiatal hernia.

## 2021-10-10 ENCOUNTER — Ambulatory Visit: Payer: HMO | Admitting: Neurology

## 2021-10-12 NOTE — Progress Notes (Signed)
[facility]  [address] [place],  [place]  [address] [phone]  Clinic Day:  10/13/2020  Referring physician: Ronita Hipps, MD  HISTORY OF PRESENT ILLNESS:  The patient is a 66 y.o. female  who I recently began seeing for thrombocytopenia.  Of note, past labs showed one of her liver enzymes being elevated.  Despite this, recent scans did not show any underlying liver disease and splenomegaly behind her thrombocytopenia.  Furthermore, prior labs did not reveal any type of nutritional deficiency factoring into her thrombocytopenia.  The patient comes in to reassess her platelet count.  Since her last visit, the patient has been doing fairly well.  She continues to deny having any underlying bleeding/bruising issues which concern her for severe thrombocytopenia being present.    PHYSICAL EXAM:  Blood pressure (!) 174/73, pulse 69, temperature 98.4 F (36.9 C), resp. rate 16, height '5\' 7"'$  (1.702 m), weight (!) 310 lb 14.4 oz (141 kg), SpO2 96 %. Wt Readings from Last 3 Encounters:  10/17/21 (!) 307 lb (139.3 kg)  10/13/21 (!) 310 lb 14.4 oz (141 kg)  09/12/21 (!) 307 lb (139.3 kg)   Body mass index is 48.69 kg/m. Performance status (ECOG): 1 - Symptomatic but completely ambulatory Physical Exam Constitutional:      Appearance: Normal appearance. She is not ill-appearing.  HENT:     Mouth/Throat:     Mouth: Mucous membranes are moist.     Pharynx: Oropharynx is clear. No oropharyngeal exudate or posterior oropharyngeal erythema.  Cardiovascular:     Rate and Rhythm: Normal rate and regular rhythm.     Heart sounds: No murmur heard.   No friction rub. No gallop.  Pulmonary:     Effort: Pulmonary effort is normal. No respiratory distress.     Breath sounds: Normal breath sounds. No wheezing, rhonchi or rales.  Abdominal:     General: Bowel sounds are normal. There is no distension.     Palpations: Abdomen is soft. There is no mass.      Tenderness: There is no abdominal tenderness.  Musculoskeletal:        General: No swelling.     Right lower leg: No edema.     Left lower leg: No edema.  Lymphadenopathy:     Cervical: No cervical adenopathy.     Upper Body:     Right upper body: No supraclavicular or axillary adenopathy.     Left upper body: No supraclavicular or axillary adenopathy.     Lower Body: No right inguinal adenopathy. No left inguinal adenopathy.  Skin:    General: Skin is warm.     Coloration: Skin is not jaundiced.     Findings: No lesion or rash.  Neurological:     General: No focal deficit present.     Mental Status: She is alert and oriented to person, place, and time. Mental status is at baseline.  Psychiatric:        Mood and Affect: Mood normal.        Behavior: Behavior normal.        Thought Content: Thought content normal.   LABS:    Latest Reference Range & Units 10/13/21 00:00  WBC  3.7 (E)  RBC 3.87 - 5.11  4.18 (E)  Hemoglobin 12.0 - 16.0  12.6 (E)  HCT 36 - 46  39 (E)  Platelets 150 - 400 K/uL 88 ! (E)  !: Data is abnormal (E): External lab result  Latest Reference Range & Units 10/13/21 13:49  TSH 0.350 - 4.500 uIU/mL 0.850    ASSESSMENT & PLAN:  A 66 y.o. female with thrombocythemia.  Her platelet count of 88 today is not much different than what it has been in the recent past.  There remains no obvious etiology behind her mild low platelet count.  She is clinically behaving as if she has ITP.  As long as her platelet count remains well above 20,000 and she has no underlying bleeding/bruising issues, her thrombocytopenia continue to be followed conservatively.  I will see her back in 6 months for repeat clinical assessment.  The patient understands all the plans discussed today and is in agreement with them.  Genesee Nase Macarthur Critchley, MD

## 2021-10-13 ENCOUNTER — Telehealth: Payer: Self-pay | Admitting: Oncology

## 2021-10-13 ENCOUNTER — Inpatient Hospital Stay: Payer: HMO

## 2021-10-13 ENCOUNTER — Other Ambulatory Visit: Payer: Self-pay | Admitting: Oncology

## 2021-10-13 ENCOUNTER — Inpatient Hospital Stay: Payer: HMO | Attending: Oncology | Admitting: Oncology

## 2021-10-13 VITALS — BP 174/73 | HR 69 | Temp 98.4°F | Resp 16 | Ht 67.0 in | Wt 310.9 lb

## 2021-10-13 DIAGNOSIS — D696 Thrombocytopenia, unspecified: Secondary | ICD-10-CM

## 2021-10-13 DIAGNOSIS — R799 Abnormal finding of blood chemistry, unspecified: Secondary | ICD-10-CM | POA: Diagnosis not present

## 2021-10-13 DIAGNOSIS — Z79899 Other long term (current) drug therapy: Secondary | ICD-10-CM | POA: Diagnosis not present

## 2021-10-13 LAB — HEPATIC FUNCTION PANEL
ALT: 24 U/L (ref 7–35)
AST: 40 — AB (ref 13–35)
Alkaline Phosphatase: 93 (ref 25–125)
Bilirubin, Total: 1

## 2021-10-13 LAB — CBC AND DIFFERENTIAL
HCT: 39 (ref 36–46)
Hemoglobin: 12.6 (ref 12.0–16.0)
Neutrophils Absolute: 1.37
Platelets: 88 10*3/uL — AB (ref 150–400)
WBC: 3.7

## 2021-10-13 LAB — CBC: RBC: 4.18 (ref 3.87–5.11)

## 2021-10-13 LAB — COMPREHENSIVE METABOLIC PANEL
Albumin: 3.4 — AB (ref 3.5–5.0)
Calcium: 9.6 (ref 8.7–10.7)

## 2021-10-13 LAB — BASIC METABOLIC PANEL
BUN: 14 (ref 4–21)
CO2: 23 — AB (ref 13–22)
Chloride: 114 — AB (ref 99–108)
Creatinine: 1 (ref 0.5–1.1)
Glucose: 92
Potassium: 4 mEq/L (ref 3.5–5.1)
Sodium: 144 (ref 137–147)

## 2021-10-13 LAB — TSH: TSH: 0.85 u[IU]/mL (ref 0.350–4.500)

## 2021-10-13 NOTE — Telephone Encounter (Signed)
Per 10/13/21 los next appt scheduled and confirmed with patient ?

## 2021-10-17 ENCOUNTER — Encounter: Payer: Self-pay | Admitting: Cardiothoracic Surgery

## 2021-10-17 ENCOUNTER — Institutional Professional Consult (permissible substitution): Payer: HMO | Admitting: Cardiothoracic Surgery

## 2021-10-17 ENCOUNTER — Other Ambulatory Visit: Payer: Self-pay | Admitting: *Deleted

## 2021-10-17 VITALS — BP 138/72 | HR 70 | Resp 20 | Ht 67.0 in | Wt 307.0 lb

## 2021-10-17 DIAGNOSIS — I359 Nonrheumatic aortic valve disorder, unspecified: Secondary | ICD-10-CM

## 2021-10-17 DIAGNOSIS — I351 Nonrheumatic aortic (valve) insufficiency: Secondary | ICD-10-CM | POA: Diagnosis not present

## 2021-10-17 DIAGNOSIS — Z01818 Encounter for other preprocedural examination: Secondary | ICD-10-CM

## 2021-10-17 NOTE — Progress Notes (Signed)
PCP is Ronita Hipps, MD ?Referring Provider is Adrian Prows, MD ? ?Chief Complaint  ?Patient presents with  ? Consult  ?  Aortic valve regurgitation, root dilatation, ECHO 4/4, cath 9/13   ? ? ?HPI: ?Patient examined, images of recent transthoracic echocardiogram and CTA personally reviewed and discussed with patient and husband. ? ?66 year old nondiabetic non-smoker with morbid obesity[weight 305 pounds] with symptomatic moderate to severe aortic insufficiency.  Dr. Einar Gip has requested evaluation for aortic valve replacement.  Patient has had increasing shortness of breath with exertion.  The patient has had some presyncopal episodes.  The echo windows from her last study are difficult due to her obesity but it appears the LV function still is satisfactory.  No other valvular abnormalities but a TEE will probably be needed prior to surgery.  Patient also will need repeat left and right heart cath as her cath 2 years ago showed some mild coronary disease. ? ?The patient is not a smoker.  CTA of the chest showed no parenchymal lung disease.  Her ascending aorta measures 4.2 cm which is not significantly aneurysmal and does not meet criteria for replacement.  The aortic valve is felt to be trileaflet on recent echoes. ? ?Patient also has thrombocytopenia and was seen by hematology and probably has ITP.  Last platelet count was only 88,000. ? ?Patient has good dental hygiene, sees a dentist regularly and should be cleared for valve replacement by her personal dentist. ? ?The patient has sleep apnea and wears a CPAP at night.  No history of COPD or smoking.  She will need pulmonary function testing and ABG prior to sternotomy. ? ?Past Medical History:  ?Diagnosis Date  ? Abnormal myocardial perfusion study 09/26/2019  ? Anemia   ? Angina pectoris ([place]) - Class II-III 09/26/2019  ? Arthritis   ? KNEES  ? Benign neoplasm of ascending colon   ? Benign neoplasm of cecum   ? Benign neoplasm of descending colon   ? Benign  neoplasm of sigmoid colon   ? Benign neoplasm of transverse colon   ? Bowel habit changes   ? Cataract   ? BILATERAL-REMOVED  ? Chronic postoperative pain 12/02/2012  ? Diabetes mellitus without complication ([place])   ? Disturbance of skin sensation 12/02/2012  ? Fibromyalgia   ? GERD (gastroesophageal reflux disease)   ? History of colonic polyps   ? Hypertension   ? Hypothyroidism   ? Myalgia and myositis, unspecified 12/02/2012  ? Personal history of colonic polyps   ? Primary hypothyroidism 11/30/2015  ? Senile nuclear sclerosis 04/24/2014  ? Sleep apnea   ? uses c-pap  ? Thyroid disease   ? Thyroid nodule 11/30/2015  ? Type 2 diabetes mellitus without complications ([place]) 61/44/3154  ? Vitamin D deficiency 11/30/2015  ? ? ?Past Surgical History:  ?Procedure Laterality Date  ? ABDOMINAL HYSTERECTOMY    ? APPENDECTOMY    ? CHOLECYSTECTOMY    ? COLONOSCOPY N/A 07/27/2015  ? Procedure: COLONOSCOPY;  Surgeon: Irene Shipper, MD;  Location: [facility];  Service: Endoscopy;  Laterality: N/A;  ? COLONOSCOPY WITH PROPOFOL N/A 04/28/2019  ? Procedure: COLONOSCOPY WITH PROPOFOL;  Surgeon: Irene Shipper, MD;  Location: [facility];  Service: Endoscopy;  Laterality: N/A;  ? EYE SURGERY    ? bilateral cataracts with lens implants  ? floater bone surgery    ? bone removed from right hand  ? FOOT SURGERY    ? RIGHT  ? FOOT SURGERY Left   ?  triple orthodesis  ? left foot surgery    ? removed tendon and placed pins in foot  ? LEFT HEART CATH AND CORONARY ANGIOGRAPHY N/A 09/26/2019  ? Procedure: LEFT HEART CATH AND CORONARY ANGIOGRAPHY;  Surgeon: Leonie Man, MD;  Location: [facility];  Service: Cardiovascular;  Laterality: N/A;  ? PLANTAR FASCIA SURGERY    ? both feet  ? POLYPECTOMY  04/28/2019  ? Procedure: POLYPECTOMY;  Surgeon: Irene Shipper, MD;  Location: [facility];  Service: Endoscopy;;  ? RIGHT HEART CATH N/A 02/15/2021  ? Procedure: RIGHT HEART CATH;  Surgeon: Nigel Mormon, MD;  Location: [facility];   Service: Cardiovascular;  Laterality: N/A;  ? TONSILLECTOMY    ? ? ?Family History  ?Problem Relation Age of Onset  ? Heart disease Mother 14  ? Diabetes Mother   ? Hypertension Mother   ? Hypertension Father   ? Colon polyps Brother   ? Stroke Maternal Grandmother   ? Heart disease Paternal Grandmother   ? Throat cancer Paternal Grandfather   ? Colon cancer Neg Hx   ? Esophageal cancer Neg Hx   ? Rectal cancer Neg Hx   ? Stomach cancer Neg Hx   ? ? ?Social History ?Social History  ? ?Tobacco Use  ? Smoking status: Never  ? Smokeless tobacco: Never  ?Vaping Use  ? Vaping Use: Never used  ?Substance Use Topics  ? Alcohol use: Never  ? Drug use: Never  ? ? ?Current Outpatient Medications  ?Medication Sig Dispense Refill  ? acetaminophen (TYLENOL) 500 MG tablet Take 1,000 mg by mouth every 6 (six) hours as needed for moderate pain.    ? aspirin EC 81 MG tablet Take 81 mg by mouth daily.    ? budesonide-formoterol (SYMBICORT) 80-4.5 MCG/ACT inhaler Inhale 2 puffs into the lungs 2 (two) times daily as needed (shortness of breath).     ? diclofenac (CATAFLAM) 50 MG tablet Take 50 mg by mouth daily.     ? ergocalciferol (VITAMIN D2) 1.25 MG (50000 UT) capsule Take 1 capsule by mouth once a week.    ? famotidine (PEPCID) 40 MG tablet Take 40 mg by mouth at bedtime.    ? hydrochlorothiazide (MICROZIDE) 12.5 MG capsule Take 1 capsule (12.5 mg total) by mouth daily. 90 capsule 3  ? isosorbide mononitrate (IMDUR) 120 MG 24 hr tablet Take 1 tablet (120 mg total) by mouth daily. 90 tablet 3  ? levothyroxine (SYNTHROID, LEVOTHROID) 175 MCG tablet Take 175-262.5 mcg by mouth See admin instructions. Take 262.5 mcg  on Monday and Friday ?Tale 175 mcg on Tuesday, Wednesday, Thursday, Saturday, and Sunday    ? linaclotide (LINZESS) 290 MCG CAPS capsule Take 290 mcg by mouth daily before breakfast.    ? losartan (COZAAR) 50 MG tablet Take 25 mg by mouth daily. Decrease to 25 mg due to soft BP and near syncope 09/13/2021    ?  nitroGLYCERIN (NITROSTAT) 0.4 MG SL tablet Place 0.4 mg under the tongue every 5 (five) minutes as needed for chest pain.    ? omeprazole (PRILOSEC) 40 MG capsule Take 40 mg by mouth daily.    ? polyethylene glycol (MIRALAX / GLYCOLAX) 17 g packet Take 17 g by mouth daily.    ? rosuvastatin (CRESTOR) 5 MG tablet TAKE ONE TABLET BY MOUTH DAILY 90 tablet 3  ? ?No current facility-administered medications for this visit.  ? ? ?Allergies  ?Allergen Reactions  ? Metamucil [Psyllium] Other (See  Comments)  ?  Severe constipation  ? Trulicity [Dulaglutide]   ?  KIDNEY INFECTION,INABILITY TO HAVE BOWEL MOVEMENTS,BLOOD IN URINE  ? Adhesive [Tape]   ?  Latex, band aids  ?causes whelts and blisters  ? Hydrocodone-Acetaminophen Itching  ?  Tolerates small/ infrequent doses  ? Oxycontin [Oxycodone Hcl]   ?  "FELT LIKE HEAD WAS GOING TO EXPLODE"  ? Ciprofloxacin Rash  ?  Yeast infection and a severe rash   ? Codeine Itching and Rash  ? Other Palpitations  ?  "makes me feel like I'm having a heart attack. -- Steroids   ? ? ?Review of Systems: ?Trying to lose weight but has been plateaued at about 300 pounds for 6 months ?Shortness of breath with exertion ?Bilateral lower extremity edema she wears knee-high compression hose. ?No history of stroke ?No difficulty swallowing ?Chronic constipation, will go several days without bowel movement and is on a regimen by gastroenterology. ?Right-hand-dominant ?No history of previous thoracic trauma pneumothorax ?Severe right hip arthritis interfering with ambulation but not a candidate for hip replacement at her weight ?Easy bruisability related to her ITP thrombocytopenia ?She has had 2 children with normal pregnancies and deliveries without cardiac problems ?Status post laparoscopic cholecystectomy without anesthetic or bleeding problems ? ?BP 138/72 (BP Location: Left Arm, Patient Position: Sitting)   Pulse 70   Resp 20   Ht '5\' 7"'$  (1.702 m)   Wt (!) 307 lb (139.3 kg)   SpO2 96%  Comment: Ra  BMI 48.08 kg/m?  ?Physical Exam: ?    ?Physical Exam ? ?General: Morbidly obese but well-kept and pleasant female no acute distress ?HEENT: Normocephalic pupils equal , dentition adequate ?Neck: Supple without JVD,

## 2021-10-19 DIAGNOSIS — G4733 Obstructive sleep apnea (adult) (pediatric): Secondary | ICD-10-CM | POA: Diagnosis not present

## 2021-10-19 DIAGNOSIS — I10 Essential (primary) hypertension: Secondary | ICD-10-CM | POA: Diagnosis not present

## 2021-10-25 ENCOUNTER — Ambulatory Visit (HOSPITAL_COMMUNITY)
Admission: RE | Admit: 2021-10-25 | Discharge: 2021-10-25 | Disposition: A | Discharge status: Home / Self Care | Payer: HMO | Source: Ambulatory Visit | Attending: Cardiothoracic Surgery | Admitting: Cardiothoracic Surgery

## 2021-10-25 DIAGNOSIS — I359 Nonrheumatic aortic valve disorder, unspecified: Secondary | ICD-10-CM | POA: Insufficient documentation

## 2021-10-25 DIAGNOSIS — R0600 Dyspnea, unspecified: Secondary | ICD-10-CM | POA: Insufficient documentation

## 2021-10-25 DIAGNOSIS — I351 Nonrheumatic aortic (valve) insufficiency: Secondary | ICD-10-CM | POA: Insufficient documentation

## 2021-10-25 DIAGNOSIS — Z01818 Encounter for other preprocedural examination: Secondary | ICD-10-CM | POA: Insufficient documentation

## 2021-10-25 LAB — PULMONARY FUNCTION TEST
DL/VA % pred: 88 %
DL/VA: 3.59 ml/min/mmHg/L
DLCO unc % pred: 90 %
DLCO unc: 20.28 ml/min/mmHg
FEF 25-75 Post: 2.78 L/sec
FEF 25-75 Pre: 2.39 L/sec
FEF2575-%Change-Post: 16 %
FEF2575-%Pred-Post: 119 %
FEF2575-%Pred-Pre: 103 %
FEV1-%Change-Post: 2 %
FEV1-%Pred-Post: 101 %
FEV1-%Pred-Pre: 98 %
FEV1-Post: 2.81 L
FEV1-Pre: 2.74 L
FEV1FVC-%Change-Post: 5 %
FEV1FVC-%Pred-Pre: 103 %
FEV6-%Change-Post: -2 %
FEV6-%Pred-Post: 96 %
FEV6-%Pred-Pre: 99 %
FEV6-Post: 3.38 L
FEV6-Pre: 3.46 L
FEV6FVC-%Change-Post: 0 %
FEV6FVC-%Pred-Post: 104 %
FEV6FVC-%Pred-Pre: 104 %
FVC-%Change-Post: -2 %
FVC-%Pred-Post: 92 %
FVC-%Pred-Pre: 95 %
FVC-Post: 3.38 L
FVC-Pre: 3.47 L
Post FEV1/FVC ratio: 83 %
Post FEV6/FVC ratio: 100 %
Pre FEV1/FVC ratio: 79 %
Pre FEV6/FVC Ratio: 100 %
RV % pred: 97 %
RV: 2.26 L
TLC % pred: 105 %
TLC: 5.95 L

## 2021-10-25 LAB — BLOOD GAS, ARTERIAL
Acid-Base Excess: 0.1 mmol/L (ref 0.0–2.0)
Bicarbonate: 23.8 mmol/L (ref 20.0–28.0)
Drawn by: 21179
O2 Saturation: 99.6 %
Patient temperature: 37
pCO2 arterial: 35 mmHg (ref 32–48)
pH, Arterial: 7.44 (ref 7.35–7.45)
pO2, Arterial: 89 mmHg (ref 83–108)

## 2021-10-25 MED ORDER — ALBUTEROL SULFATE (2.5 MG/3ML) 0.083% IN NEBU
2.5000 mg | INHALATION_SOLUTION | Freq: Once | RESPIRATORY_TRACT | Status: AC
  Administered 2021-10-25: 2.5 mg via RESPIRATORY_TRACT

## 2021-11-01 LAB — VON WILLEBRAND PANEL
Factor-VIII Activity: 96 % normal (ref 50–180)
Ristocetin Co-Factor: 190 % normal (ref 42–200)
Von Willebrand Antigen, Plasma: 253 % — ABNORMAL HIGH (ref 50–217)
aPTT: 30 s (ref 23–32)

## 2021-11-02 ENCOUNTER — Ambulatory Visit: Payer: HMO | Admitting: Cardiology

## 2021-11-03 DIAGNOSIS — Z9289 Personal history of other medical treatment: Secondary | ICD-10-CM

## 2021-11-03 HISTORY — DX: Personal history of other medical treatment: Z92.89

## 2021-11-08 ENCOUNTER — Encounter (HOSPITAL_COMMUNITY)
Admission: RE | Disposition: A | Discharge status: Home / Self Care | Payer: Self-pay | Source: Ambulatory Visit | Attending: Cardiology

## 2021-11-08 ENCOUNTER — Other Ambulatory Visit: Payer: Self-pay

## 2021-11-08 ENCOUNTER — Ambulatory Visit (HOSPITAL_COMMUNITY)
Admission: RE | Admit: 2021-11-08 | Discharge: 2021-11-08 | Disposition: A | Discharge status: Home / Self Care | Payer: HMO | Source: Ambulatory Visit | Attending: Cardiology | Admitting: Cardiology

## 2021-11-08 ENCOUNTER — Encounter (HOSPITAL_COMMUNITY): Payer: Self-pay | Admitting: Cardiology

## 2021-11-08 DIAGNOSIS — E785 Hyperlipidemia, unspecified: Secondary | ICD-10-CM | POA: Insufficient documentation

## 2021-11-08 DIAGNOSIS — I77810 Thoracic aortic ectasia: Secondary | ICD-10-CM | POA: Diagnosis not present

## 2021-11-08 DIAGNOSIS — R0609 Other forms of dyspnea: Secondary | ICD-10-CM | POA: Insufficient documentation

## 2021-11-08 DIAGNOSIS — I351 Nonrheumatic aortic (valve) insufficiency: Secondary | ICD-10-CM | POA: Diagnosis not present

## 2021-11-08 DIAGNOSIS — E119 Type 2 diabetes mellitus without complications: Secondary | ICD-10-CM | POA: Insufficient documentation

## 2021-11-08 DIAGNOSIS — Z6841 Body Mass Index (BMI) 40.0 and over, adult: Secondary | ICD-10-CM | POA: Diagnosis not present

## 2021-11-08 DIAGNOSIS — G4730 Sleep apnea, unspecified: Secondary | ICD-10-CM | POA: Insufficient documentation

## 2021-11-08 DIAGNOSIS — I25118 Atherosclerotic heart disease of native coronary artery with other forms of angina pectoris: Secondary | ICD-10-CM | POA: Diagnosis not present

## 2021-11-08 DIAGNOSIS — E039 Hypothyroidism, unspecified: Secondary | ICD-10-CM | POA: Diagnosis not present

## 2021-11-08 DIAGNOSIS — K449 Diaphragmatic hernia without obstruction or gangrene: Secondary | ICD-10-CM | POA: Insufficient documentation

## 2021-11-08 DIAGNOSIS — K219 Gastro-esophageal reflux disease without esophagitis: Secondary | ICD-10-CM | POA: Insufficient documentation

## 2021-11-08 DIAGNOSIS — M797 Fibromyalgia: Secondary | ICD-10-CM | POA: Diagnosis not present

## 2021-11-08 DIAGNOSIS — I10 Essential (primary) hypertension: Secondary | ICD-10-CM | POA: Diagnosis not present

## 2021-11-08 HISTORY — PX: RIGHT/LEFT HEART CATH AND CORONARY ANGIOGRAPHY: CATH118266

## 2021-11-08 LAB — POCT I-STAT 7, (LYTES, BLD GAS, ICA,H+H)
Acid-base deficit: 4 mmol/L — ABNORMAL HIGH (ref 0.0–2.0)
Bicarbonate: 20.5 mmol/L (ref 20.0–28.0)
Calcium, Ion: 1.34 mmol/L (ref 1.15–1.40)
HCT: 31 % — ABNORMAL LOW (ref 36.0–46.0)
Hemoglobin: 10.5 g/dL — ABNORMAL LOW (ref 12.0–15.0)
O2 Saturation: 97 %
Potassium: 3.6 mmol/L (ref 3.5–5.1)
Sodium: 146 mmol/L — ABNORMAL HIGH (ref 135–145)
TCO2: 22 mmol/L (ref 22–32)
pCO2 arterial: 33.2 mmHg (ref 32–48)
pH, Arterial: 7.4 (ref 7.35–7.45)
pO2, Arterial: 86 mmHg (ref 83–108)

## 2021-11-08 LAB — POCT I-STAT EG7
Acid-base deficit: 3 mmol/L — ABNORMAL HIGH (ref 0.0–2.0)
Bicarbonate: 21.2 mmol/L (ref 20.0–28.0)
Calcium, Ion: 1.24 mmol/L (ref 1.15–1.40)
HCT: 29 % — ABNORMAL LOW (ref 36.0–46.0)
Hemoglobin: 9.9 g/dL — ABNORMAL LOW (ref 12.0–15.0)
O2 Saturation: 80 %
Potassium: 3.5 mmol/L (ref 3.5–5.1)
Sodium: 147 mmol/L — ABNORMAL HIGH (ref 135–145)
TCO2: 22 mmol/L (ref 22–32)
pCO2, Ven: 34.5 mmHg — ABNORMAL LOW (ref 44–60)
pH, Ven: 7.397 (ref 7.25–7.43)
pO2, Ven: 44 mmHg (ref 32–45)

## 2021-11-08 SURGERY — RIGHT/LEFT HEART CATH AND CORONARY ANGIOGRAPHY
Anesthesia: LOCAL

## 2021-11-08 MED ORDER — HEPARIN SODIUM (PORCINE) 1000 UNIT/ML IJ SOLN
INTRAMUSCULAR | Status: DC | PRN
  Administered 2021-11-08: 3000 [IU] via INTRAVENOUS

## 2021-11-08 MED ORDER — HEPARIN (PORCINE) IN NACL 1000-0.9 UT/500ML-% IV SOLN
INTRAVENOUS | Status: DC | PRN
  Administered 2021-11-08 (×2): 500 mL

## 2021-11-08 MED ORDER — LIDOCAINE HCL (PF) 1 % IJ SOLN
INTRAMUSCULAR | Status: AC
  Filled 2021-11-08: qty 30

## 2021-11-08 MED ORDER — SODIUM CHLORIDE 0.9 % WEIGHT BASED INFUSION
3.0000 mL/kg/h | INTRAVENOUS | Status: AC
  Administered 2021-11-08: 3 mL/kg/h via INTRAVENOUS

## 2021-11-08 MED ORDER — ASPIRIN 81 MG PO CHEW
81.0000 mg | CHEWABLE_TABLET | ORAL | Status: AC
Start: 2021-11-09 — End: 2021-11-08
  Administered 2021-11-08: 81 mg via ORAL
  Filled 2021-11-08: qty 1

## 2021-11-08 MED ORDER — LIDOCAINE HCL (PF) 1 % IJ SOLN
INTRAMUSCULAR | Status: DC | PRN
  Administered 2021-11-08: 2 mL
  Administered 2021-11-08: 1 mL

## 2021-11-08 MED ORDER — HEPARIN (PORCINE) IN NACL 2-0.9 UNITS/ML
INTRAMUSCULAR | Status: DC | PRN
  Administered 2021-11-08: 10 mL via INTRA_ARTERIAL

## 2021-11-08 MED ORDER — FENTANYL CITRATE (PF) 100 MCG/2ML IJ SOLN
INTRAMUSCULAR | Status: AC
  Filled 2021-11-08: qty 2

## 2021-11-08 MED ORDER — HEPARIN SODIUM (PORCINE) 1000 UNIT/ML IJ SOLN
INTRAMUSCULAR | Status: AC
Start: 2021-11-08 — End: ?
  Filled 2021-11-08: qty 10

## 2021-11-08 MED ORDER — ACETAMINOPHEN 325 MG PO TABS: 650.0000 mg | ORAL_TABLET | ORAL | Status: DC | PRN

## 2021-11-08 MED ORDER — MIDAZOLAM HCL 2 MG/2ML IJ SOLN
INTRAMUSCULAR | Status: DC | PRN
  Administered 2021-11-08: 2 mg via INTRAVENOUS

## 2021-11-08 MED ORDER — SODIUM CHLORIDE 0.9% FLUSH: 3.0000 mL | Freq: Two times a day (BID) | INTRAVENOUS | Status: DC

## 2021-11-08 MED ORDER — SODIUM CHLORIDE 0.9% FLUSH: 3.0000 mL | INTRAVENOUS | Status: DC | PRN

## 2021-11-08 MED ORDER — HEPARIN (PORCINE) IN NACL 1000-0.9 UT/500ML-% IV SOLN
INTRAVENOUS | Status: AC
  Filled 2021-11-08: qty 1000

## 2021-11-08 MED ORDER — SODIUM CHLORIDE 0.9 % IV SOLN: 250.0000 mL | INTRAVENOUS | Status: DC | PRN

## 2021-11-08 MED ORDER — SODIUM CHLORIDE 0.9 % WEIGHT BASED INFUSION: 1.0000 mL/kg/h | INTRAVENOUS | Status: DC

## 2021-11-08 MED ORDER — FENTANYL CITRATE (PF) 100 MCG/2ML IJ SOLN
INTRAMUSCULAR | Status: DC | PRN
  Administered 2021-11-08: 25 ug via INTRAVENOUS
  Administered 2021-11-08: 50 ug via INTRAVENOUS

## 2021-11-08 MED ORDER — ONDANSETRON HCL 4 MG/2ML IJ SOLN: 4.0000 mg | Freq: Four times a day (QID) | INTRAMUSCULAR | Status: DC | PRN

## 2021-11-08 MED ORDER — MIDAZOLAM HCL 2 MG/2ML IJ SOLN
INTRAMUSCULAR | Status: AC
  Filled 2021-11-08: qty 2

## 2021-11-08 MED ORDER — IOHEXOL 350 MG/ML SOLN
INTRAVENOUS | Status: DC | PRN
  Administered 2021-11-08: 55 mL

## 2021-11-08 MED ORDER — VERAPAMIL HCL 2.5 MG/ML IV SOLN
INTRAVENOUS | Status: AC
Start: 2021-11-08 — End: ?
  Filled 2021-11-08: qty 2

## 2021-11-08 SURGICAL SUPPLY — 13 items
BAND CMPR LRG ZPHR (HEMOSTASIS) ×1
BAND ZEPHYR COMPRESS 30 LONG (HEMOSTASIS) ×1 IMPLANT
CATH BALLN WEDGE 5F 110CM (CATHETERS) ×1 IMPLANT
CATH OPTITORQUE TIG 4.0 5F (CATHETERS) ×1 IMPLANT
GLIDESHEATH SLEND A-KIT 6F 22G (SHEATH) ×1 IMPLANT
GUIDEWIRE .025 260CM (WIRE) ×1 IMPLANT
GUIDEWIRE INQWIRE 1.5J.035X260 (WIRE) IMPLANT
INQWIRE 1.5J .035X260CM (WIRE) ×2
KIT HEART LEFT (KITS) ×3 IMPLANT
PACK CARDIAC CATHETERIZATION (CUSTOM PROCEDURE TRAY) ×3 IMPLANT
SHEATH GLIDE SLENDER 4/5FR (SHEATH) ×1 IMPLANT
TRANSDUCER W/STOPCOCK (MISCELLANEOUS) ×3 IMPLANT
TUBING CIL FLEX 10 FLL-RA (TUBING) ×3 IMPLANT

## 2021-11-08 NOTE — Discharge Instructions (Signed)
Radial Site Care  This sheet gives you information about how to care for yourself after your procedure. Your health care provider may also give you more specific instructions. If you have problems or questions, contact your health care provider. What can I expect after the procedure? After the procedure, it is common to have: Bruising and tenderness at the catheter insertion area. Follow these instructions at home: Medicines Take over-the-counter and prescription medicines only as told by your health care provider. Insertion site care Follow instructions from your health care provider about how to take care of your insertion site. Make sure you: Wash your hands with soap and water before you remove your bandage (dressing). If soap and water are not available, use hand sanitizer. May remove dressing in 24 hours. Check your insertion site every day for signs of infection. Check for: Redness, swelling, or pain. Fluid or blood. Pus or a bad smell. Warmth. Do no take baths, swim, or use a hot tub for 5 days. You may shower 24-48 hours after the procedure. Remove the dressing and gently wash the site with plain soap and water. Pat the area dry with a clean towel. Do not rub the site. That could cause bleeding. Do not apply powder or lotion to the site. Activity  For 24 hours after the procedure, or as directed by your health care provider: Do not flex or bend the affected arm. Do not push or pull heavy objects with the affected arm. Do not drive yourself home from the hospital or clinic. You may drive 24 hours after the procedure. Do not operate machinery or power tools. KEEP ARM ELEVATED THE REMAINDER OF THE DAY. Do not push, pull or lift anything that is heavier than 10 lb for 5 days. Ask your health care provider when it is okay to: Return to work or school. Resume usual physical activities or sports. Resume sexual activity. General instructions If the catheter site starts to  bleed, raise your arm and put firm pressure on the site. If the bleeding does not stop, get help right away. This is a medical emergency. DRINK PLENTY OF FLUIDS FOR THE NEXT 2-3 DAYS. No alcohol consumption for 24 hours after receiving sedation. If you went home on the same day as your procedure, a responsible adult should be with you for the first 24 hours after you arrive home. Keep all follow-up visits as told by your health care provider. This is important. Contact a health care provider if: You have a fever. You have redness, swelling, or yellow drainage around your insertion site. Get help right away if: You have unusual pain at the radial site. The catheter insertion area swells very fast. The insertion area is bleeding, and the bleeding does not stop when you hold steady pressure on the area. Your arm or hand becomes pale, cool, tingly, or numb. These symptoms may represent a serious problem that is an emergency. Do not wait to see if the symptoms will go away. Get medical help right away. Call your local emergency services (911 in the [place] Do not drive yourself to the hospital. Summary After the procedure, it is common to have bruising and tenderness at the site. Follow instructions from your health care provider about how to take care of your radial site wound. Check the wound every day for signs of infection.  This information is not intended to replace advice given to you by your health care provider. Make sure you discuss any questions you have with   your health care provider. Document Revised: 06/27/2017 Document Reviewed: 06/27/2017 Elsevier Patient Education  2020 [employer]  

## 2021-11-08 NOTE — Interval H&P Note (Signed)
History and Physical Interval Note:  11/08/2021 10:56 AM  Joanna Alvarado  has presented today for surgery, with the diagnosis of chest pain - aortic reg.  The various methods of treatment have been discussed with the patient and family. After consideration of risks, benefits and other options for treatment, the patient has consented to  Procedure(s): RIGHT/LEFT HEART CATH AND CORONARY ANGIOGRAPHY (N/A) as a surgical intervention.  The patient's history has been reviewed, patient examined, no change in status, stable for surgery.  I have reviewed the patient's chart and labs.  Questions were answered to the patient's satisfaction.     Adrian Prows

## 2021-11-08 NOTE — H&P (Signed)
Primary Physician/Referring:  Ronita Hipps, MD  Patient ID: Joanna Alvarado, female    DOB: Oct 24, 1955, 67 y.o.   MRN: [id number]  No chief complaint on file.  HPI:    Joanna Alvarado  is a 66 y.o. female with a past medical history of hypertension, controlled diabetes mellitus, now only has hyperglycemia with weight loss hypercholesteremia, morbid obesity with sleep apnea using CPAP at night, GERD, hiatal hernia, fibromyalgia, hypothyroidism and chronic stable angina with normal coronary arteries/minimal coronary artery disease by angiography in April 2021. She has symptomatic moderately severe aortic regurgitation with angina pectoris and also dyspnea on exertion.  She was evaluated by Dr. Dahlia Byes and recommended right and left heart catheterization prior to proceeding with aortic valve replacement.  No new symptoms.  She has not had any rest angina, no PND or orthopnea.   Past Medical History:  Diagnosis Date   Abnormal myocardial perfusion study 09/26/2019   Anemia    Angina pectoris ([place]) - Class II-III 09/26/2019   Arthritis    KNEES   Benign neoplasm of ascending colon    Benign neoplasm of cecum    Benign neoplasm of descending colon    Benign neoplasm of sigmoid colon    Benign neoplasm of transverse colon    Bowel habit changes    Cataract    BILATERAL-REMOVED   Chronic postoperative pain 12/02/2012   Diabetes mellitus without complication ([place])    Disturbance of skin sensation 12/02/2012   Fibromyalgia    GERD (gastroesophageal reflux disease)    History of colonic polyps    Hypertension    Hypothyroidism    Myalgia and myositis, unspecified 12/02/2012   Personal history of colonic polyps    Primary hypothyroidism 11/30/2015   Senile nuclear sclerosis 04/24/2014   Sleep apnea    uses c-pap   Thyroid disease    Thyroid nodule 11/30/2015   Type 2 diabetes mellitus without complications ([place]) 11/94/1740   Vitamin D deficiency 11/30/2015    Social History    Tobacco Use   Smoking status: Never   Smokeless tobacco: Never  Substance Use Topics   Alcohol use: Never   Marital Status: Married   ROS  Review of Systems  Cardiovascular:  Positive for chest pain and dyspnea on exertion. Negative for leg swelling and orthopnea.  Respiratory:  Positive for snoring (on cpap).   Musculoskeletal:  Positive for arthritis and back pain.  Objective  Blood pressure (!) 135/56, pulse 71, temperature 97.8 F (36.6 C), temperature source Oral, resp. rate 18, height '5\' 8"'$  (1.727 m), weight (!) 139.3 kg, SpO2 99 %.     11/08/2021    8:37 AM 10/17/2021    3:02 PM 10/13/2021    3:02 PM  Vitals with BMI  Height '5\' 8"'$  '5\' 7"'$  '5\' 7"'$   Weight 307 lbs 307 lbs 310 lbs 14 oz  BMI 46.69 81.44 81.85  Systolic 631 497 026  Diastolic 56 72 73  Pulse 71 70 69     Physical Exam Constitutional:      Comments: Morbidly obese. No acute distress.  Neck:     Vascular: No JVD.  Cardiovascular:     Rate and Rhythm: Normal rate and regular rhythm.     Pulses:          Carotid pulses are 2+ on the right side with bruit and 2+ on the left side with bruit.      Radial pulses are 2+ on the right side  and 2+ on the left side.       Dorsalis pedis pulses are 2+ on the right side and 2+ on the left side.       Posterior tibial pulses are 2+ on the right side and 2+ on the left side.     Heart sounds: Murmur heard.  High-pitched blowing decrescendo early diastolic murmur is present with a grade of 2/4 at the upper right sternal border radiating to the apex.     Comments: Popliteal pulses difficult to palpate due to patient body habitus. Pulmonary:     Effort: Pulmonary effort is normal.     Breath sounds: Normal breath sounds.  Abdominal:     General: Bowel sounds are normal.     Palpations: Abdomen is soft.     Comments: Large pannus  Musculoskeletal:     Right lower leg: No edema.     Left lower leg: No edema.   Laboratory examination:   Recent Labs    01/21/21 1036  02/15/21 1002 02/15/21 1012 03/15/21 0000 10/13/21 0000  NA 144   < > 144 139 144  K 3.8   < > 3.5 3.8 4.0  CL 108*  --   --  107 114*  CO2 20  --   --  23* 23*  GLUCOSE 80  --   --   --   --   BUN 22  --   --  16 14  CREATININE 1.42*  --   --  1.2* 1.0  CALCIUM 10.4*  --   --  10.8* 9.6   < > = values in this interval not displayed.    CrCl cannot be calculated (Patient's most recent lab result is older than the maximum 21 days allowed.).     Latest Ref Rng & Units 10/13/2021   12:00 AM 03/15/2021   12:00 AM 02/15/2021   10:12 AM  CMP  BUN 4 - '21 14      16        '$ Creatinine 0.5 - 1.1 1.0      1.2        Sodium 137 - 147 144      139      144    Potassium 3.5 - 5.1 mEq/L 4.0      3.8      3.5    Chloride 99 - 108 114      107        CO2 13 - '22 23      23        '$ Calcium 8.7 - 10.7 9.6      10.8        Alkaline Phos 25 - 125 93      97        AST 13 - 35 40      59        ALT 7 - 35 U/L 24      26           This result is from an external source.      Latest Ref Rng & Units 10/13/2021   12:00 AM 03/15/2021   12:00 AM 02/15/2021   10:12 AM  CBC  WBC  3.7      4.4        Hemoglobin 12.0 - 16.0 12.6      13.7      9.9    Hematocrit 36 - 46 39  40      29.0    Platelets 150 - 400 K/uL 88      91           This result is from an external source.    Lipid Panel No results for input(s): CHOL, TRIG, LDLCALC, VLDL, HDL, CHOLHDL, LDLDIRECT in the last 8760 hours.  External labs:    Labs 04/01/2020:  A1c 5.5%.  TSH normal.  Free T4 minimally elevated.  Vitamin D normal at 36.  A1C 6.7% 11/2019  Medications and allergies   Allergies  Allergen Reactions   Metamucil [Psyllium] Other (See Comments)    Severe constipation   Trulicity [Dulaglutide]     KIDNEY INFECTION,INABILITY TO HAVE BOWEL MOVEMENTS,BLOOD IN URINE   Adhesive [Tape]     Latex, band aids  causes whelts and blisters   Hydrocodone-Acetaminophen Itching    Tolerates small/ infrequent doses    Oxycontin [Oxycodone Hcl]     "FELT LIKE HEAD WAS GOING TO EXPLODE"   Ciprofloxacin Rash    Yeast infection and a severe rash    Codeine Itching and Rash   Other Palpitations    Steroids      "makes me feel like I'm having a heart attack. -     Current Facility-Administered Medications:    0.9 %  sodium chloride infusion, 250 mL, Intravenous, PRN, Adrian Prows, MD   [EXPIRED] 0.9% sodium chloride infusion, 3 mL/kg/hr, Intravenous, Continuous, Last Rate: 417.9 mL/hr at 11/08/21 0923, 3 mL/kg/hr at 11/08/21 0923 **FOLLOWED BY** 0.9% sodium chloride infusion, 1 mL/kg/hr, Intravenous, Continuous, Adrian Prows, MD, Last Rate: 139.3 mL/hr at 11/08/21 0924, 1 mL/kg/hr at 11/08/21 0924   sodium chloride flush (NS) 0.9 % injection 3 mL, 3 mL, Intravenous, Q12H, Adrian Prows, MD   sodium chloride flush (NS) 0.9 % injection 3 mL, 3 mL, Intravenous, PRN, Adrian Prows, MD   Radiology:   Chest X-ray 2 View 07/04/2019 Faint opacity within the peripheral aspect of the left lower lobe could reflect a small developing infiltrate. Lungs are otherwise clear.  Cardiac Studies:   ZIO Monitor 7 days 09/17/2019 ZIO monitor was performed for 7 days and 2 hours beginning 08/27/2019 to evaluate palpitation. The rhythm throughout was sinus with minimum average and maximum heart rates of 50, 73 and 157 bpm. There were no pauses of 3 seconds or greater and there were no episodes of second or third-degree AV block or sinus node exit block. There where 5 triggered and 4 diary events predominantly seen with sinus rhythm although at times there were atrial or ventricular premature contractions. Ventricular ectopy was rare with PVCs and couplets.  There was one 7 beat run of PVCs present. Supraventricular ectopy was rare there were no episodes of atrial fibrillation or flutter.  There was one brief atrial tachycardias seen 9 complexes at a rate of 111 bpm  Conclusion, rare ventricular and supraventricular ectopy however there  is one brief run of PVCs and one short episode of atrial tachycardia.  The diary and triggered events were predominantly sinus rhythm.  Myocardial Perfusion w/ Lexiscan Stress Test 09/17/2019 Nuclear stress EF: 61%. The left ventricular ejection fraction is normal (55-65%). No T wave inversion was noted during stress. There was no ST segment deviation noted during stress. Defect 1: There is a medium defect of moderate severity present in the basal inferior and mid inferior location. There is mild hypokinesis of the basal-mid inferior wall. Findings consistent with prior myocardial infarction with no evidence of peri-infarct ischemia.  This is a low risk study.  Left Heart Catheterization 09/26/2019 The left ventricular systolic function is normal. LV end diastolic pressure is normal. There is no aortic valve stenosis. Prox RCA to Mid RCA lesion is 10% stenosed. 1st Diag lesion is 55% stenosed. Mid LAD lesion is 10-20% stenosed. Angiographically normal coronary arteries with minimal CAD.  No obvious culprit lesion to explain symptoms or stress test results.  False Positive Nuclear Stress Test -> however cannot exclude possibility of microvascular disease. Normal LV function and EDP.  Carotid artery duplex 03/10/2020: The bifurcation, internal, external and common carotid arteries reveal no evidence of significant stenosis, bilaterally. Antegrade right vertebral artery flow. Antegrade left vertebral artery flow.  Right heart catheterization 02/15/2021: RA: 5 mmHg RV: 22/0 mmHg PA: 23/14 mmHg, mPAP 18 mmHg PCW: 9 mmHg   CO: 9.2 L/min CI: 3.8 L/min/m2   Normal filling pressures No pulmonary hypertension  PCV ECHOCARDIOGRAM COMPLETE 09/06/2021  Left ventricle cavity is normal in size. Mild concentric hypertrophy of the left ventricle. Normal global wall motion. Normal LV systolic function with EF 56%. Normal diastolic filling pattern. Left atrial cavity is mildly dilated. Structurally  normal trileaflet aortic valve.  Moderate to severe aortic regurgitation. The aortic root is normal. Proximal ascending aorta noted to be dilated at 4.4 cm on previous study on 01/10/2021, not adequately visualized well on this study.  EKG:   EKG 08/31/2021: Normal sinus rhythm at rate of 81 bpm, left axis deviation, left anterior fascicular block.  LVH.  No significant change from 01/20/2021.      Assessment  1.  Moderately severe symptomatic nonrheumatic aortic regurgitation 2.  Chronic stable angina pectoris 3.  Dyspnea on exertion    Recommendations:   Joanna Alvarado is a 66 y.o. female with a past medical history of hypertension, controlled diabetes mellitus, now only has hyperglycemia with weight loss hypercholesteremia, morbid obesity with sleep apnea using CPAP at night, GERD, hiatal hernia, fibromyalgia, hypothyroidism and chronic stable angina with normal coronary arteries/minimal coronary artery disease by angiography in April 2021.  She is now having worsening symptoms of angina and also worsening dyspnea on exertion.  Echocardiogram done recently confirms moderately severe aortic regurgitation and moderate acing aortic root dilatation.  She is now scheduled for aortic valve replacement along with aortic root replacement, was seen by Dr. Dahlia Byes, requested right and left heart catheterization.  We will proceed with the same.  All questions answered.  Schedule for cardiac catheterization, and possible angioplasty. We discussed regarding risks, benefits, alternatives to this including stress testing, CTA and continued medical therapy. Patient wants to proceed. Understands <1-2% risk of death, stroke, MI, urgent CABG, bleeding, infection, renal failure but not limited to these.     Adrian Prows, MD, [facility] 11/08/2021, 10:00 AM Office: [phone]

## 2021-11-14 ENCOUNTER — Ambulatory Visit: Payer: HMO | Admitting: Cardiothoracic Surgery

## 2021-11-14 ENCOUNTER — Encounter: Payer: Self-pay | Admitting: Cardiothoracic Surgery

## 2021-11-14 ENCOUNTER — Encounter: Payer: Self-pay | Admitting: *Deleted

## 2021-11-14 ENCOUNTER — Other Ambulatory Visit: Payer: Self-pay | Admitting: *Deleted

## 2021-11-14 DIAGNOSIS — I351 Nonrheumatic aortic (valve) insufficiency: Secondary | ICD-10-CM | POA: Diagnosis not present

## 2021-11-14 NOTE — Progress Notes (Signed)
HPI 66 year old 300 pound nondiabetic returns for further discussion of her symptomatic moderate to severe aortic insufficiency.  She is a patient of Dr. Irven Shelling.  She had repeat left and right heart cath and follow-up of her study in 2021.  This shows mild pulmonary hypertension, normal cardiac output, and clean coronaries with the exception of a small first diagonal branch of the LAD with a 75% stenosis but the vessel is too small for her to benefit from CABG.  Patient was evaluated by her dentist and cleared for valve replacement surgery.  The patient had pulmonary function test which were satisfactory with good mechanics of breathing.  The patient's von Willebrand's panel was also within normal limits.  She has history of thrombocytopenia and probable ITP followed by hematology.  Last platelet count was 80,000.  She has had previous surgical procedures without bleeding issues.  The patient is now ready to proceed with aortic valve replacement which will be scheduled next week.  We both agree that a bioprosthetic valve would be her best choice for AVR.  Current Outpatient Medications  Medication Sig Dispense Refill   acetaminophen (TYLENOL) 500 MG tablet Take 1,000 mg by mouth every 6 (six) hours as needed for moderate pain.     budesonide-formoterol (SYMBICORT) 80-4.5 MCG/ACT inhaler Inhale 2 puffs into the lungs 2 (two) times daily as needed (shortness of breath).      diclofenac (CATAFLAM) 50 MG tablet Take 50 mg by mouth daily.      famotidine (PEPCID) 40 MG tablet Take 40 mg by mouth at bedtime.     hydrochlorothiazide (MICROZIDE) 12.5 MG capsule Take 1 capsule (12.5 mg total) by mouth daily. 90 capsule 3   isosorbide mononitrate (IMDUR) 120 MG 24 hr tablet Take 1 tablet (120 mg total) by mouth daily. 90 tablet 3   levothyroxine (SYNTHROID, LEVOTHROID) 175 MCG tablet Take 175-262.5 mcg by mouth See admin instructions. Take 262.5 mcg  on Tuesday Tale 175 mcg on Monday, Wednesday, Thursday,  Friday, Saturday, and Sunday     lidocaine (LIDODERM) 5 % Place 1 patch onto the skin daily as needed (back, hip and knee pain). Remove & Discard patch within 12 hours or as directed by MD     linaclotide (LINZESS) 290 MCG CAPS capsule Take 290 mcg by mouth daily before breakfast.     losartan (COZAAR) 50 MG tablet Take 25 mg by mouth daily. Decrease to 25 mg due to soft BP and near syncope 09/13/2021     nitroGLYCERIN (NITROSTAT) 0.4 MG SL tablet Place 0.4 mg under the tongue every 5 (five) minutes as needed for chest pain.     omeprazole (PRILOSEC) 40 MG capsule Take 40 mg by mouth daily.     polyethylene glycol (MIRALAX / GLYCOLAX) 17 g packet Take 17 g by mouth daily. 2 caps full     rosuvastatin (CRESTOR) 5 MG tablet TAKE ONE TABLET BY MOUTH DAILY 90 tablet 3   aspirin EC 81 MG tablet Take 81 mg by mouth daily. (Patient not taking: Reported on 11/14/2021)     ergocalciferol (VITAMIN D2) 1.25 MG (50000 UT) capsule Take 1 capsule by mouth once a week. (Patient not taking: Reported on 11/14/2021)     No current facility-administered medications for this visit.   The patient was seen by her hematologist in May of this year who felt that her platelet count and function was adequate for aortic valve replacement surgery.  I have called Dr. Lavera Guise to confirm and left a  message.  Review of Systems:                   Review of Systems :  [ y ] = yes, [  ] = no        General :  Weight gain [   ]    Weight loss  [   ]  Fatigue [  x]  Fever [  ]  Chills  [weight down 3 pounds on a diet]                                          HEENT    Headache [  ]  Dizziness [  ]  Blurred vision [  ] Glaucoma  [  ]                          Nosebleeds [  ] Painful or loose teeth [  ]        Cardiac :  Chest pain/ pressure [  ]  Resting SOB [  ] exertional SOB [ x ]x                        Orthopnea [  ]  Pedal edema  [  ]  Palpitations [ x ] Syncope/presyncope '[ ]'$                         Paroxysmal  nocturnal dyspnea [  ]         Pulmonary : cough [  ]  wheezing [  ]  Hemoptysis [  ] Sputum [  ] Snoring [  ]                              Pneumothorax [  ]  Sleep apnea [  ]        GI : Vomiting [  ]  Dysphagia [  ]  Melena  [  ]  Abdominal pain [  ] BRBPR [  ]              Heart burn [  ]  Constipation [  ] Diarrhea  [  ] Colonoscopy [   ]        GU : Hematuria [  ]  Dysuria [  ]  Nocturia [  ] UTI's [  ]        Vascular : Claudication [  ]  Rest pain [  ]  DVT [  ] Vein stripping [  ] leg ulcers [  ]                          TIA [  ] Stroke [  ]  Varicose veins [  ]        NEURO :  Headaches  [  ] Seizures [  ] Vision changes [  ] Paresthesias [  ]                                               Musculoskeletal :  Arthritis [  x] Gout  [  ]  Back pain [  ]  Joint pain [ x ] right hip and right knee        Skin :  Rash [  ]  Melanoma [  ] Sores [  ]        Heme : Bleeding problems [  ]Clotting Disorders [  ] Anemia [  ]Blood Transfusion '[ ]'$         Endocrine : Diabetes [  ] Heat or Cold intolerance [  ] Polyuria [  ]excessive thirst '[ ]'$         Psych : Depression [  ]  Anxiety [  ]  Psych hospitalizations [  ] Memory change [  ]                                                                            Physical Exam     Physical Exam  General: Obese middle-aged white female no acute distress HEENT: Normocephalic pupils equal , dentition adequate Neck: Supple without JVD, adenopathy, or bruit Chest: Clear to auscultation, symmetrical breath sounds, no rhonchi, no tenderness             or deformity Cardiovascular: Regular rate and rhythm, soft diastolic 2/6 murmur left sternal border.  No gallop, peripheral pulses             palpable in all extremities Abdomen:  Soft, nontender, no palpable mass or organomegaly Extremities: Warm, well-perfused, no clubbing cyanosis edema or tenderness,              no venous stasis changes of the legs Rectal/GU: Deferred Neuro: Grossly  non--focal and symmetrical throughout Skin: Clean and dry without rash or ulceration    Diagnostic Tests: PFT testing shows normal FEV1 FVC and DLCO Von Willebrand's panel for platelet function is within normal limits  Impression: Symptomatic AI felt to be moderate to severe on echo by Dr. Einar Gip.  Trileaflet aortic valve.  No significant coronary disease other than a small first diagonal branch of the LAD.  Mild aortic dilatation with ascending aorta measured at 4.2 cm, does not meet criteria for replacement.  Plan: Plan aortic valve replacement with a bioprosthetic valve on June 22 at [facility].  Benefits and risks of surgery have been discussed in detail with the patient and her husband.  I will call her hematologist office again to discuss any preparation that might help her platelet dysfunction/thrombocytopenia including a course of oral Decadron or IVIG.    Dahlia Byes, MD [facility] [phone]

## 2021-11-15 ENCOUNTER — Ambulatory Visit: Payer: HMO | Admitting: Adult Health

## 2021-11-15 ENCOUNTER — Encounter: Payer: Self-pay | Admitting: Adult Health

## 2021-11-15 VITALS — BP 124/52 | HR 75 | Ht 68.0 in | Wt 311.0 lb

## 2021-11-15 DIAGNOSIS — G4733 Obstructive sleep apnea (adult) (pediatric): Secondary | ICD-10-CM

## 2021-11-15 DIAGNOSIS — Z9989 Dependence on other enabling machines and devices: Secondary | ICD-10-CM

## 2021-11-15 NOTE — Patient Instructions (Signed)
Will adjust pressure settings as discussed during visit  Order sent to your DME company to obtain new mask as requested    Follow-up in 6 months or call earlier if needed

## 2021-11-15 NOTE — Progress Notes (Signed)
[facility] [address] [place]. [place] [address] [phone]       OFFICE FOLLOW UP NOTE  Ms. [name] Date of Birth:  July 24, 1955 Medical Record Number:  [id number]   Reason for visit: CPAP follow-up    SUBJECTIVE:   CHIEF COMPLAINT:  Chief Complaint  Patient presents with   Follow-up    Rm 2 with husband- pt reports she has been doing ok with CPAP but trouble tolerating original mask. Has been using a mask given to her friend and tolerates it better. (Airfit N20 size medium is the preferred mask)    HPI:    Update 11/15/2021 JM: patient returns for CPAP follow up visit.  She is accompanied by her husband.  She received her new CPAP machine back in April. Review of compliance report showed 29/30 usage days and 28 days greater than 4 hours for 93% compliance. Average usage 9 hrs and 18 minutes. Residual AHI 0.5.  Leaks in the 95th percentile 8.0.  Pressure in the 95th percentile 11.1 on pressure settings 6-12 with EPR level 3.  Reports initially difficulty tolerating nasal pillow mask which was initially provided.  She has been using her friends nasal mask which she has been tolerating well.  She does feel as though she could use a little bit more pressure.  She is scheduled for heart surgery on 6/22 - reports multiple episodes of passing out and "not getting enough oxygen". She believes increased fatigue and sleepiness is in setting of her heart condition and is hopeful this will improve after procedure.  Epworth Sleepiness Scale 11/24 (prior 1/24).  Fatigue severity scale 63/63 (prior 63/63).  No further concerns at this time      History provided for reference purposes only  Update 09/12/2021 JM: Completed HST 06/2020 which showed overall mild OSA with total AHI 5.4/h and O2 nadir of 86%.  Recommended continued use of CPAP with plans on obtaining a new CPAP machine. She cancelled prior scheduled follow-up visit with Dr. Rexene Alberts in 10/2020.  She has not  yet been able to obtain a new CPAP machine as insurance is requiring a face-to-face visit.  She is currently using her old machine with last 30-day compliance report reviewed which showed 30 out of 30 usage days with 30 days greater than 4 hours for 100% compliance.  Average usage 9 hours and 18 minutes.  Residual AHI 0.1 on set pressure 9.  Leaks in the 95th percentile 18.0.  Reports not liking CPAP but uses it nightly. She has been having some issues tolerating mask - she reached out to DME company but was told she could not obtain a new mask until she receives a new CPAP machine.  Reports occasional leaks from mask especially when turning from side to side and current straps are uncomfortable.   No further apnea related concerns at this time.  Consult visit 05/20/2020 Dr. Rexene Alberts:  Ms. Suppa is a 66 year old right-handed woman with an underlying medical history of vitamin D deficiency, thyroid disease, hypothyroidism, hypertension, reflux disease, intermittent, aortic valve insufficiency, with moderate to severe aortic regurgitation, anemia, arthritis, and morbid obesity with a BMI of over 50, who was previously diagnosed with obstructive sleep apnea and placed on CPAP therapy.  Prior sleep study results are not available for my review today.  Her original sleep studies were probably 15 years ago.  She reports having to studies back to back, likely diagnostic followed by a titration study.  Her current machine was given  to her some 5 years ago after she had another set of studies through Dr. Alcide Clever in [place].  Her DME company was [facility].  She has not received supplies in the recent months or even years, she needs new supplies including a new headgear.  She uses nasal pillows.  Sometimes she has nostril irritation on the left side.  Of note, she fractured her nose some 20 years ago and has subsequently had a significant deviated septum and nasal deformity.    I reviewed your office note  from 04/19/2020.  Her Epworth sleepiness score is 1 out of 24 today, fatigue severity score is 63 out of 63.  She feels easily short of breath with mild exertion, even just walking from the parking lot to the clinic today.  She has been consistent with her CPAP.  She typically does not skip the night.  She has a variable sleep schedule depending on various factors, often she helps take care of her grandbabies.  She goes to bed anywhere between 9 PM and 1 AM and rise time is anywhere between 5 AM and 9 AM.  I was able to review her CPAP compliance data from 04/20/2020 through 05/19/2020, which is a total of 30 days, during which time she used her machine every night with percent use days greater than 4 hours at 100%, indicating superb compliance, average usage of 9 hours and 17 minutes, residual AHI at goal at 0.4/h, leak acceptable with a 95th percentile at 13.3 L/min on a pressure of 9 cm.  She reports that she could not tolerate the excess humidity and has reduced the humidity setting from 4 down to 2. She is working on weight loss.  She has lost some weight thus far.  She may need aortic valve replacement eventually.  She has a follow-up appointment with you in mid February.   She is married and lives with her husband.  She has a son and a daughter and 2 stepchildren.  She is retired, she worked as a [employer] representative.  She had a tonsillectomy and appendectomy at age 24.  She has undergone multiple other surgeries.  She also had significant left foot injury and surgery as well as hardware in place.   He is a non-smoker and does not drink alcohol and drinks caffeine in the form of coffee, 1 cup/day on average.       ROS:   14 system review of systems performed and negative with exception of those listed in HPI  PMH:  Past Medical History:  Diagnosis Date   Abnormal myocardial perfusion study 09/26/2019   Anemia    Angina pectoris ([place]) - Class II-III 09/26/2019   Arthritis    KNEES    Benign neoplasm of ascending colon    Benign neoplasm of cecum    Benign neoplasm of descending colon    Benign neoplasm of sigmoid colon    Benign neoplasm of transverse colon    Bowel habit changes    Cataract    BILATERAL-REMOVED   Chronic postoperative pain 12/02/2012   Diabetes mellitus without complication (HCC)    Disturbance of skin sensation 12/02/2012   Fibromyalgia    GERD (gastroesophageal reflux disease)    History of colonic polyps    Hypertension    Hypothyroidism    Myalgia and myositis, unspecified 12/02/2012   Personal history of colonic polyps    Primary hypothyroidism 11/30/2015   Senile nuclear sclerosis 04/24/2014   Sleep apnea  uses c-pap   Thyroid disease    Thyroid nodule 11/30/2015   Type 2 diabetes mellitus without complications ([place]) 09/38/1829   Vitamin D deficiency 11/30/2015    PSH:  Past Surgical History:  Procedure Laterality Date   ABDOMINAL HYSTERECTOMY     APPENDECTOMY     CHOLECYSTECTOMY     COLONOSCOPY N/A 07/27/2015   Procedure: COLONOSCOPY;  Surgeon: Irene Shipper, MD;  Location: [facility];  Service: Endoscopy;  Laterality: N/A;   COLONOSCOPY WITH PROPOFOL N/A 04/28/2019   Procedure: COLONOSCOPY WITH PROPOFOL;  Surgeon: Irene Shipper, MD;  Location: [facility];  Service: Endoscopy;  Laterality: N/A;   EYE SURGERY     bilateral cataracts with lens implants   floater bone surgery     bone removed from right hand   FOOT SURGERY     RIGHT   FOOT SURGERY Left    triple orthodesis   left foot surgery     removed tendon and placed pins in foot   LEFT HEART CATH AND CORONARY ANGIOGRAPHY N/A 09/26/2019   Procedure: LEFT HEART CATH AND CORONARY ANGIOGRAPHY;  Surgeon: Leonie Man, MD;  Location: [facility];  Service: Cardiovascular;  Laterality: N/A;   PLANTAR FASCIA SURGERY     both feet   POLYPECTOMY  04/28/2019   Procedure: POLYPECTOMY;  Surgeon: Irene Shipper, MD;  Location: [facility];  Service: Endoscopy;;   RIGHT  HEART CATH N/A 02/15/2021   Procedure: RIGHT HEART CATH;  Surgeon: Nigel Mormon, MD;  Location: [facility];  Service: Cardiovascular;  Laterality: N/A;   RIGHT/LEFT HEART CATH AND CORONARY ANGIOGRAPHY N/A 11/08/2021   Procedure: RIGHT/LEFT HEART CATH AND CORONARY ANGIOGRAPHY;  Surgeon: Adrian Prows, MD;  Location: [facility];  Service: Cardiovascular;  Laterality: N/A;   TONSILLECTOMY      Social History:  Social History   Socioeconomic History   Marital status: Married    Spouse name: Not on file   Number of children: 2   Years of education: Not on file   Highest education level: Some college, no degree  Occupational History    Comment: retired  Tobacco Use   Smoking status: Never   Smokeless tobacco: Never  Vaping Use   Vaping Use: Never used  Substance and Sexual Activity   Alcohol use: Never   Drug use: Never   Sexual activity: Not on file  Other Topics Concern   Not on file  Social History Narrative   Lives with husband   caffeine 1 c daily   Social Determinants of Health   Financial Resource Strain: Not on file  Food Insecurity: Not on file  Transportation Needs: Not on file  Physical Activity: Not on file  Stress: Not on file  Social Connections: Not on file  Intimate Partner Violence: Not on file    Family History:  Family History  Problem Relation Age of Onset   Heart disease Mother 27   Diabetes Mother    Hypertension Mother    Hypertension Father    Colon polyps Brother    Stroke Maternal Grandmother    Heart disease Paternal Grandmother    Throat cancer Paternal Grandfather    Colon cancer Neg Hx    Esophageal cancer Neg Hx    Rectal cancer Neg Hx    Stomach cancer Neg Hx     Medications:   Current Outpatient Medications on File Prior to Visit  Medication Sig Dispense Refill   acetaminophen (  TYLENOL) 500 MG tablet Take 1,000 mg by mouth every 6 (six) hours as needed for moderate pain.     budesonide-formoterol (SYMBICORT)  80-4.5 MCG/ACT inhaler Inhale 2 puffs into the lungs 2 (two) times daily as needed (shortness of breath).      diclofenac (CATAFLAM) 50 MG tablet Take 50 mg by mouth daily.      famotidine (PEPCID) 40 MG tablet Take 40 mg by mouth at bedtime.     hydrochlorothiazide (MICROZIDE) 12.5 MG capsule Take 1 capsule (12.5 mg total) by mouth daily. 90 capsule 3   isosorbide mononitrate (IMDUR) 120 MG 24 hr tablet Take 1 tablet (120 mg total) by mouth daily. 90 tablet 3   levothyroxine (SYNTHROID, LEVOTHROID) 175 MCG tablet Take 175-262.5 mcg by mouth See admin instructions. Take 175 mcg by mouth daily, except on Tuesday take 262.5 mcg     lidocaine (LIDODERM) 5 % Place 1 patch onto the skin daily as needed (back, hip and knee pain). Remove & Discard patch within 12 hours or as directed by MD     linaclotide (LINZESS) 290 MCG CAPS capsule Take 290 mcg by mouth daily before breakfast.     losartan (COZAAR) 50 MG tablet Take 25 mg by mouth daily.     nitroGLYCERIN (NITROSTAT) 0.4 MG SL tablet Place 0.4 mg under the tongue every 5 (five) minutes as needed for chest pain.     omeprazole (PRILOSEC) 40 MG capsule Take 40 mg by mouth daily.     polyethylene glycol (MIRALAX / GLYCOLAX) 17 g packet Take 17 g by mouth daily.     rosuvastatin (CRESTOR) 5 MG tablet TAKE ONE TABLET BY MOUTH DAILY 90 tablet 3   No current facility-administered medications on file prior to visit.    Allergies:   Allergies  Allergen Reactions   Metamucil [Psyllium] Other (See Comments)    Severe constipation   Trulicity [Dulaglutide]     KIDNEY INFECTION,INABILITY TO HAVE BOWEL MOVEMENTS,BLOOD IN URINE   Adhesive [Tape]     Latex, band aids  causes whelts and blisters   Hydrocodone-Acetaminophen Itching    Tolerates small/ infrequent doses   Oxycontin [Oxycodone Hcl]     "FELT LIKE HEAD WAS GOING TO EXPLODE"   Ciprofloxacin Rash    Yeast infection and a severe rash    Codeine Itching and Rash   Other Palpitations     Steroids      "makes me feel like I'm having a heart attack. -      OBJECTIVE:  Physical Exam  Vitals:   11/15/21 1545  Weight: (!) 311 lb (141.1 kg)  Height: '5\' 8"'$  (1.727 m)   Body mass index is 47.29 kg/m. No results found.   General: Morbidly obese very pleasant middle-age Caucasian female, seated, in no evident distress Head: head normocephalic and atraumatic.   Neck: supple with no carotid or supraclavicular bruits Cardiovascular: regular rate and rhythm Musculoskeletal: no deformity Skin:  no rash/petichiae Vascular:  Normal pulses all extremities   Neurologic Exam Mental Status: Awake and fully alert. Oriented to place and time. Recent and remote memory intact. Attention span, concentration and fund of knowledge appropriate. Mood and affect appropriate.  Cranial Nerves: Pupils equal, briskly reactive to light. Extraocular movements full without nystagmus. Visual fields full to confrontation. Hearing intact. Facial sensation intact. Face, tongue, palate moves normally and symmetrically.  Motor: Normal bulk and tone. Normal strength in all tested extremity muscles Sensory.: intact to touch , pinprick , position and vibratory sensation.  Coordination: Rapid alternating movements normal in all extremities. Finger-to-nose and heel-to-shin performed accurately bilaterally. Gait and Station: Arises from chair without difficulty. Stance is normal. Gait demonstrates wide-based gait without use of assistive device. Tandem walk and heel toe not attempted Reflexes: 1+ and symmetric. Toes downgoing.         ASSESSMENT: TWANA WILEMAN is a 66 y.o. year old female OSA on CPAP. Repeat HST 06/2020 which showed total AHI of 5.4/h.  Received new CPAP machine 09/2021     PLAN:  OSA on CPAP : per pt request, will submit order to be refitted with new mask (see HPI). Will also adjust pressure settings to improve comfort from 6-12 to 8-12.  Otherwise, compliance report shows  satisfactory usage with optimal residual AHI.  Discussed continued nightly usage with ensuring greater than 4 hours nightly for optimal benefit and per insurance purposes.  Continue to follow with DME company for any needed supplies or CPAP related concerns   Follow-up in 6 months or call earlier if needed    CC:  PCP: Ronita Hipps, MD    I spent 26 minutes of face-to-face and non-face-to-face time with patient and husband.  This included previsit chart review, lab review, study review, order entry, electronic health record documentation, patient and husband education regarding sleep apnea with review and discussion of compliance report and answered all other questions to patient and husband's satisfaction   Frann Rider, AGNP-BC  [facility] [address] [place], [place] [address]  Phone [phone] Fax [phone] Note: This document was prepared with digital dictation and possible smart phrase technology. Any transcriptional errors that result from this process are unintentional.

## 2021-11-16 ENCOUNTER — Other Ambulatory Visit: Payer: Self-pay | Admitting: *Deleted

## 2021-11-16 MED ORDER — DEXAMETHASONE 20 MG PO TABS: 40.0000 mg | ORAL_TABLET | Freq: Every day | ORAL | 0 refills | Status: DC

## 2021-11-16 NOTE — Progress Notes (Signed)
Decadron '40mg'$  PO daily for 2 days called into patient preferred pharmacy per Dr. Prescott Gum. Patient advised to begin taking medication Tuesday 6/21. Patient verbalized understanding.

## 2021-11-19 DIAGNOSIS — G4733 Obstructive sleep apnea (adult) (pediatric): Secondary | ICD-10-CM | POA: Diagnosis not present

## 2021-11-19 DIAGNOSIS — I10 Essential (primary) hypertension: Secondary | ICD-10-CM | POA: Diagnosis not present

## 2021-11-21 ENCOUNTER — Ambulatory Visit: Payer: HMO | Admitting: Cardiology

## 2021-11-21 NOTE — Pre-Procedure Instructions (Signed)
Surgical Instructions    Your procedure is scheduled on November 24, 2021.  Report to [facility] Main Entrance "A" at 5:30 A.M., then check in with the Admitting office.  Call this number if you have problems the morning of surgery:  [phone]   If you have any questions prior to your surgery date call [phone]: Open Monday-Friday 8am-4pm    Remember:  Do not eat or drink after midnight the night before your surgery   Take these medicines the morning of surgery with A SIP OF WATER:  dexamethasone   isosorbide mononitrate (IMDUR)  levothyroxine (SYNTHROID, LEVOTHROID)   omeprazole (PRILOSEC)  rosuvastatin (CRESTOR)   linaclotide (LINZESS)   Take these medicines the morning of surgery AS NEEDED:  acetaminophen (TYLENOL)   budesonide-formoterol (SYMBICORT)  nitroGLYCERIN (NITROSTAT) - please let hospital staff know if you take this morning of surgery   As of today, STOP taking any Aspirin (unless otherwise instructed by your surgeon) Aleve, Naproxen, Ibuprofen, Motrin, Advil, Goody's, BC's, all herbal medications, fish oil, and all vitamins. This includes diclofenac (CATAFLAM)   HOW TO MANAGE YOUR DIABETES BEFORE AND AFTER SURGERY  Why is it important to control my blood sugar before and after surgery? Improving blood sugar levels before and after surgery helps healing and can limit problems. A way of improving blood sugar control is eating a healthy diet by:  Eating less sugar and carbohydrates  Increasing activity/exercise  Talking with your doctor about reaching your blood sugar goals High blood sugars (greater than 180 mg/dL) can raise your risk of infections and slow your recovery, so you will need to focus on controlling your diabetes during the weeks before surgery. Make sure that the doctor who takes care of your diabetes knows about your planned surgery including the date and location.  How do I manage my blood sugar before surgery? Check your blood sugar at least  4 times a day, starting 2 days before surgery, to make sure that the level is not too high or low.  Check your blood sugar the morning of your surgery when you wake up and every 2 hours until you get to the Short Stay unit.  If your blood sugar is less than 70 mg/dL, you will need to treat for low blood sugar: Do not take insulin. Treat a low blood sugar (less than 70 mg/dL) with  cup of clear juice (cranberry or apple), 4 glucose tablets, OR glucose gel. Recheck blood sugar in 15 minutes after treatment (to make sure it is greater than 70 mg/dL). If your blood sugar is not greater than 70 mg/dL on recheck, call [phone] for further instructions. Report your blood sugar to the short stay nurse when you get to Short Stay.  If you are admitted to the hospital after surgery: Your blood sugar will be checked by the staff and you will probably be given insulin after surgery (instead of oral diabetes medicines) to make sure you have good blood sugar levels. The goal for blood sugar control after surgery is 80-180 mg/dL.                      Do NOT Smoke (Tobacco/Vaping) for 24 hours prior to your procedure.  If you use a CPAP at night, you may bring your mask/headgear for your overnight stay.   Contacts, glasses, piercing's, hearing aid's, dentures or partials may not be worn into surgery, please bring cases for these belongings.    For patients admitted to the  hospital, discharge time will be determined by your treatment team.   Patients discharged the day of surgery will not be allowed to drive home, and someone needs to stay with them for 24 hours.  SURGICAL WAITING ROOM VISITATION Patients having surgery or a procedure may have two support people in the waiting room. These visitors may be switched out with other visitors if needed. Children under the age of 78 must have an adult accompany them who is not the patient. If the patient needs to stay at the hospital during part of their  recovery, the visitor guidelines for inpatient rooms apply.  Please refer to the [facility] website for the visitor guidelines for Inpatients (after your surgery is over and you are in a regular room).    Special instructions:   - Preparing For Surgery  Before surgery, you can play an important role. Because skin is not sterile, your skin needs to be as free of germs as possible. You can reduce the number of germs on your skin by washing with CHG (chlorahexidine gluconate) Soap before surgery.  CHG is an antiseptic cleaner which kills germs and bonds with the skin to continue killing germs even after washing.    Oral Hygiene is also important to reduce your risk of infection.  Remember - BRUSH YOUR TEETH THE MORNING OF SURGERY WITH YOUR REGULAR TOOTHPASTE  Please do not use if you have an allergy to CHG or antibacterial soaps. If your skin becomes reddened/irritated stop using the CHG.  Do not shave (including legs and underarms) for at least 48 hours prior to first CHG shower. It is OK to shave your face.  Please follow these instructions carefully.   Shower the NIGHT BEFORE SURGERY and the MORNING OF SURGERY  If you chose to wash your hair, wash your hair first as usual with your normal shampoo.  After you shampoo, rinse your hair and body thoroughly to remove the shampoo.  Use CHG Soap as you would any other liquid soap. You can apply CHG directly to the skin and wash gently with a scrungie or a clean washcloth.   Apply the CHG Soap to your body ONLY FROM THE NECK DOWN.  Do not use on open wounds or open sores. Avoid contact with your eyes, ears, mouth and genitals (private parts). Wash Face and genitals (private parts)  with your normal soap.   Wash thoroughly, paying special attention to the area where your surgery will be performed.  Thoroughly rinse your body with warm water from the neck down.  DO NOT shower/wash with your normal soap after using and rinsing off the  CHG Soap.  Pat yourself dry with a CLEAN TOWEL.  Wear CLEAN PAJAMAS to bed the night before surgery  Place CLEAN SHEETS on your bed the night before your surgery  DO NOT SLEEP WITH PETS.   Day of Surgery: Take a shower with CHG soap.  Do not wear jewelry or makeup Do not wear lotions, powders, perfumes/colognes, or deodorant. Do not shave 48 hours prior to surgery.  Men may shave face and neck. Do not bring valuables to the hospital.  [facility] is not responsible for any belongings or valuables. Do not wear nail polish, gel polish, artificial nails, or any other type of covering on natural nails (fingers and toes) If you have artificial nails or gel coating that need to be removed by a nail salon, please have this removed prior to surgery. Artificial nails or gel coating may  interfere with anesthesia's ability to adequately monitor your vital signs.  Wear Clean/Comfortable clothing the morning of surgery  Remember to brush your teeth WITH YOUR REGULAR TOOTHPASTE.   Please read over the following fact sheets that you were given.    If you received a COVID test during your pre-op visit  it is requested that you wear a mask when out in public, stay away from anyone that may not be feeling well and notify your surgeon if you develop symptoms. If you have been in contact with anyone that has tested positive in the last 10 days please notify you surgeon.

## 2021-11-22 ENCOUNTER — Ambulatory Visit (HOSPITAL_COMMUNITY)
Admission: RE | Admit: 2021-11-22 | Discharge: 2021-11-22 | Disposition: A | Discharge status: Home / Self Care | Payer: HMO | Source: Ambulatory Visit | Attending: Cardiothoracic Surgery | Admitting: Cardiothoracic Surgery

## 2021-11-22 ENCOUNTER — Other Ambulatory Visit: Payer: Self-pay

## 2021-11-22 ENCOUNTER — Ambulatory Visit (HOSPITAL_BASED_OUTPATIENT_CLINIC_OR_DEPARTMENT_OTHER)
Admission: RE | Admit: 2021-11-22 | Discharge: 2021-11-22 | Disposition: A | Discharge status: Home / Self Care | Payer: HMO | Source: Ambulatory Visit | Attending: Cardiothoracic Surgery | Admitting: Cardiothoracic Surgery

## 2021-11-22 ENCOUNTER — Encounter (HOSPITAL_COMMUNITY): Payer: Self-pay

## 2021-11-22 ENCOUNTER — Encounter (HOSPITAL_COMMUNITY)
Admission: RE | Admit: 2021-11-22 | Discharge: 2021-11-22 | Disposition: A | Discharge status: Home / Self Care | Payer: HMO | Source: Ambulatory Visit | Attending: Cardiothoracic Surgery | Admitting: Cardiothoracic Surgery

## 2021-11-22 VITALS — BP 151/52 | HR 62 | Temp 97.8°F | Resp 18 | Ht 68.0 in | Wt 320.0 lb

## 2021-11-22 DIAGNOSIS — I351 Nonrheumatic aortic (valve) insufficiency: Secondary | ICD-10-CM

## 2021-11-22 DIAGNOSIS — Z86010 Personal history of colonic polyps: Secondary | ICD-10-CM | POA: Diagnosis not present

## 2021-11-22 DIAGNOSIS — I7121 Aneurysm of the ascending aorta, without rupture: Secondary | ICD-10-CM | POA: Diagnosis not present

## 2021-11-22 DIAGNOSIS — E559 Vitamin D deficiency, unspecified: Secondary | ICD-10-CM | POA: Diagnosis present

## 2021-11-22 DIAGNOSIS — I454 Nonspecific intraventricular block: Secondary | ICD-10-CM | POA: Diagnosis present

## 2021-11-22 DIAGNOSIS — J939 Pneumothorax, unspecified: Secondary | ICD-10-CM | POA: Diagnosis not present

## 2021-11-22 DIAGNOSIS — D649 Anemia, unspecified: Secondary | ICD-10-CM | POA: Insufficient documentation

## 2021-11-22 DIAGNOSIS — D693 Immune thrombocytopenic purpura: Secondary | ICD-10-CM | POA: Diagnosis not present

## 2021-11-22 DIAGNOSIS — G4733 Obstructive sleep apnea (adult) (pediatric): Secondary | ICD-10-CM | POA: Diagnosis present

## 2021-11-22 DIAGNOSIS — Z862 Personal history of diseases of the blood and blood-forming organs and certain disorders involving the immune mechanism: Secondary | ICD-10-CM | POA: Diagnosis not present

## 2021-11-22 DIAGNOSIS — M797 Fibromyalgia: Secondary | ICD-10-CM | POA: Diagnosis present

## 2021-11-22 DIAGNOSIS — R918 Other nonspecific abnormal finding of lung field: Secondary | ICD-10-CM | POA: Diagnosis not present

## 2021-11-22 DIAGNOSIS — D62 Acute posthemorrhagic anemia: Secondary | ICD-10-CM | POA: Diagnosis not present

## 2021-11-22 DIAGNOSIS — E1136 Type 2 diabetes mellitus with diabetic cataract: Secondary | ICD-10-CM | POA: Insufficient documentation

## 2021-11-22 DIAGNOSIS — Z953 Presence of xenogenic heart valve: Secondary | ICD-10-CM | POA: Diagnosis not present

## 2021-11-22 DIAGNOSIS — M170 Bilateral primary osteoarthritis of knee: Secondary | ICD-10-CM | POA: Diagnosis present

## 2021-11-22 DIAGNOSIS — Z452 Encounter for adjustment and management of vascular access device: Secondary | ICD-10-CM | POA: Diagnosis not present

## 2021-11-22 DIAGNOSIS — Z20822 Contact with and (suspected) exposure to covid-19: Secondary | ICD-10-CM | POA: Diagnosis not present

## 2021-11-22 DIAGNOSIS — I272 Pulmonary hypertension, unspecified: Secondary | ICD-10-CM | POA: Diagnosis not present

## 2021-11-22 DIAGNOSIS — Z01818 Encounter for other preprocedural examination: Secondary | ICD-10-CM | POA: Diagnosis not present

## 2021-11-22 DIAGNOSIS — J90 Pleural effusion, not elsewhere classified: Secondary | ICD-10-CM | POA: Diagnosis not present

## 2021-11-22 DIAGNOSIS — J9811 Atelectasis: Secondary | ICD-10-CM | POA: Diagnosis not present

## 2021-11-22 DIAGNOSIS — R079 Chest pain, unspecified: Secondary | ICD-10-CM | POA: Diagnosis not present

## 2021-11-22 DIAGNOSIS — E039 Hypothyroidism, unspecified: Secondary | ICD-10-CM | POA: Diagnosis not present

## 2021-11-22 DIAGNOSIS — E119 Type 2 diabetes mellitus without complications: Secondary | ICD-10-CM | POA: Diagnosis not present

## 2021-11-22 DIAGNOSIS — J811 Chronic pulmonary edema: Secondary | ICD-10-CM | POA: Diagnosis not present

## 2021-11-22 DIAGNOSIS — R001 Bradycardia, unspecified: Secondary | ICD-10-CM | POA: Diagnosis present

## 2021-11-22 DIAGNOSIS — K219 Gastro-esophageal reflux disease without esophagitis: Secondary | ICD-10-CM | POA: Diagnosis present

## 2021-11-22 DIAGNOSIS — I080 Rheumatic disorders of both mitral and aortic valves: Secondary | ICD-10-CM | POA: Insufficient documentation

## 2021-11-22 DIAGNOSIS — K449 Diaphragmatic hernia without obstruction or gangrene: Secondary | ICD-10-CM | POA: Diagnosis present

## 2021-11-22 DIAGNOSIS — I083 Combined rheumatic disorders of mitral, aortic and tricuspid valves: Secondary | ICD-10-CM | POA: Diagnosis not present

## 2021-11-22 DIAGNOSIS — I517 Cardiomegaly: Secondary | ICD-10-CM | POA: Diagnosis not present

## 2021-11-22 DIAGNOSIS — D689 Coagulation defect, unspecified: Secondary | ICD-10-CM | POA: Diagnosis not present

## 2021-11-22 DIAGNOSIS — I700 Atherosclerosis of aorta: Secondary | ICD-10-CM | POA: Diagnosis present

## 2021-11-22 DIAGNOSIS — Z6841 Body Mass Index (BMI) 40.0 and over, adult: Secondary | ICD-10-CM | POA: Diagnosis not present

## 2021-11-22 DIAGNOSIS — I77810 Thoracic aortic ectasia: Secondary | ICD-10-CM | POA: Diagnosis not present

## 2021-11-22 DIAGNOSIS — I10 Essential (primary) hypertension: Secondary | ICD-10-CM | POA: Diagnosis not present

## 2021-11-22 DIAGNOSIS — E8770 Fluid overload, unspecified: Secondary | ICD-10-CM | POA: Diagnosis not present

## 2021-11-22 HISTORY — DX: Nonrheumatic aortic (valve) insufficiency: I35.1

## 2021-11-22 HISTORY — DX: Immune thrombocytopenic purpura: D69.3

## 2021-11-22 HISTORY — DX: Pneumonia, unspecified organism: J18.9

## 2021-11-22 HISTORY — DX: Personal history of urinary calculi: Z87.442

## 2021-11-22 LAB — URINALYSIS, ROUTINE W REFLEX MICROSCOPIC
Bilirubin Urine: NEGATIVE
Glucose, UA: NEGATIVE mg/dL
Ketones, ur: NEGATIVE mg/dL
Nitrite: NEGATIVE
Protein, ur: NEGATIVE mg/dL
Specific Gravity, Urine: 1.028 (ref 1.005–1.030)
pH: 5 (ref 5.0–8.0)

## 2021-11-22 LAB — COMPREHENSIVE METABOLIC PANEL
ALT: 24 U/L (ref 0–44)
AST: 41 U/L (ref 15–41)
Albumin: 2.9 g/dL — ABNORMAL LOW (ref 3.5–5.0)
Alkaline Phosphatase: 87 U/L (ref 38–126)
Anion gap: 9 (ref 5–15)
BUN: 27 mg/dL — ABNORMAL HIGH (ref 8–23)
CO2: 16 mmol/L — ABNORMAL LOW (ref 22–32)
Calcium: 9.8 mg/dL (ref 8.9–10.3)
Chloride: 117 mmol/L — ABNORMAL HIGH (ref 98–111)
Creatinine, Ser: 1.11 mg/dL — ABNORMAL HIGH (ref 0.44–1.00)
GFR, Estimated: 54 mL/min — ABNORMAL LOW (ref >60)
Glucose, Bld: 241 mg/dL — ABNORMAL HIGH (ref 70–99)
Potassium: 3.9 mmol/L (ref 3.5–5.1)
Sodium: 142 mmol/L (ref 135–145)
Total Bilirubin: 1.1 mg/dL (ref 0.3–1.2)
Total Protein: 5.3 g/dL — ABNORMAL LOW (ref 6.5–8.1)

## 2021-11-22 LAB — HEMOGLOBIN A1C
Hgb A1c MFr Bld: 4.9 % (ref 4.8–5.6)
Mean Plasma Glucose: 93.93 mg/dL

## 2021-11-22 LAB — SURGICAL PCR SCREEN
MRSA, PCR: NEGATIVE
Staphylococcus aureus: NEGATIVE

## 2021-11-22 LAB — BLOOD GAS, ARTERIAL
Acid-base deficit: 2.3 mmol/L — ABNORMAL HIGH (ref 0.0–2.0)
Bicarbonate: 21.4 mmol/L (ref 20.0–28.0)
Drawn by: 58793
O2 Saturation: 99.4 %
Patient temperature: 37
pCO2 arterial: 33 mmHg (ref 32–48)
pH, Arterial: 7.42 (ref 7.35–7.45)
pO2, Arterial: 105 mmHg (ref 83–108)

## 2021-11-22 LAB — CBC
HCT: 35.4 % — ABNORMAL LOW (ref 36.0–46.0)
Hemoglobin: 11.7 g/dL — ABNORMAL LOW (ref 12.0–15.0)
MCH: 31.3 pg (ref 26.0–34.0)
MCHC: 33.1 g/dL (ref 30.0–36.0)
MCV: 94.7 fL (ref 80.0–100.0)
Platelets: 78 10*3/uL — ABNORMAL LOW (ref 150–400)
RBC: 3.74 MIL/uL — ABNORMAL LOW (ref 3.87–5.11)
RDW: 13.6 % (ref 11.5–15.5)
WBC: 3.3 10*3/uL — ABNORMAL LOW (ref 4.0–10.5)
nRBC: 0 % (ref 0.0–0.2)

## 2021-11-22 LAB — GLUCOSE, CAPILLARY: Glucose-Capillary: 216 mg/dL — ABNORMAL HIGH (ref 70–99)

## 2021-11-22 LAB — PROTIME-INR
INR: 1.2 (ref 0.8–1.2)
Prothrombin Time: 14.6 seconds (ref 11.4–15.2)

## 2021-11-22 LAB — APTT: aPTT: 30 seconds (ref 24–36)

## 2021-11-22 LAB — SARS CORONAVIRUS 2 BY RT PCR: SARS Coronavirus 2 by RT PCR: NEGATIVE

## 2021-11-22 NOTE — Progress Notes (Signed)
PCP - Kennith Maes MD Cardiologist - Adrian Prows MD Endocrinologist- Iran Planas MD  PPM/ICD - denies Device Orders -  Rep Notified -   Chest x-ray - 11/22/21 EKG - 11/08/21 Stress Test - 09/18/19 ECHO - 09/06/21 Cardiac Cath - 11/08/21  Sleep Study - 07/05/20 CPAP - yes  Fasting Blood Sugar - Pt states she checks her blood sugar "occasionally". Last time she checked it, it was "93". Checks Blood Sugar _____ times a day  Blood Thinner Instructions:na Aspirin Instructions:na  ERAS Protcol -no PRE-SURGERY Ensure or G2-   COVID TEST- yes-11/22/21   Anesthesia review: yes -cardiac history; low platelets-78.  Patient denies shortness of breath, fever, cough and chest pain at PAT appointment   All instructions explained to the patient, with a verbal understanding of the material. Patient agrees to go over the instructions while at home for a better understanding. Patient also instructed to wear a mask when out in public after being tested for Covid. The opportunity to ask questions was provided.

## 2021-11-22 NOTE — Progress Notes (Signed)
Pre-CABG (AVR) Dopplers completed. Refer to "CV Proc" under chart review to view preliminary results.  11/22/2021 10:39 AM Kelby Aline., MHA, RVT, RDCS, RDMS

## 2021-11-23 ENCOUNTER — Encounter (HOSPITAL_COMMUNITY): Payer: Self-pay

## 2021-11-23 ENCOUNTER — Other Ambulatory Visit: Payer: Self-pay | Admitting: *Deleted

## 2021-11-23 ENCOUNTER — Ambulatory Visit: Payer: HMO | Admitting: Cardiology

## 2021-11-23 DIAGNOSIS — I351 Nonrheumatic aortic (valve) insufficiency: Secondary | ICD-10-CM

## 2021-11-23 LAB — ECHOCARDIOGRAM COMPLETE
AR max vel: 2.56 cm2
AV Area VTI: 2.53 cm2
AV Area mean vel: 2.42 cm2
AV Mean grad: 9 mmHg
AV Peak grad: 16.5 mmHg
Ao pk vel: 2.03 m/s
Area-P 1/2: 3.03 cm2
P 1/2 time: 398 msec
S' Lateral: 4 cm

## 2021-11-23 MED ORDER — NOREPINEPHRINE 4 MG/250ML-% IV SOLN
0.0000 ug/min | INTRAVENOUS | Status: DC
  Filled 2021-11-23: qty 250

## 2021-11-23 MED ORDER — TRANEXAMIC ACID (OHS) BOLUS VIA INFUSION
15.0000 mg/kg | INTRAVENOUS | Status: AC
  Administered 2021-11-24: 2178 mg via INTRAVENOUS
  Filled 2021-11-23: qty 2178

## 2021-11-23 MED ORDER — NITROGLYCERIN IN D5W 200-5 MCG/ML-% IV SOLN
2.0000 ug/min | INTRAVENOUS | Status: AC
  Administered 2021-11-24: 10 ug/min via INTRAVENOUS
  Filled 2021-11-23: qty 250

## 2021-11-23 MED ORDER — DEXMEDETOMIDINE HCL IN NACL 400 MCG/100ML IV SOLN
0.1000 ug/kg/h | INTRAVENOUS | Status: AC
  Administered 2021-11-24: .7 ug/kg/h via INTRAVENOUS
  Filled 2021-11-23: qty 100

## 2021-11-23 MED ORDER — CEFAZOLIN SODIUM-DEXTROSE 2-4 GM/100ML-% IV SOLN
2.0000 g | INTRAVENOUS | Status: DC
  Filled 2021-11-23: qty 100

## 2021-11-23 MED ORDER — INSULIN REGULAR(HUMAN) IN NACL 100-0.9 UT/100ML-% IV SOLN
INTRAVENOUS | Status: AC
  Administered 2021-11-24: 16 [IU]/h via INTRAVENOUS
  Filled 2021-11-23: qty 100

## 2021-11-23 MED ORDER — VANCOMYCIN HCL 1500 MG/300ML IV SOLN
1500.0000 mg | INTRAVENOUS | Status: AC
  Administered 2021-11-24: 1500 mg via INTRAVENOUS
  Filled 2021-11-23: qty 300

## 2021-11-23 MED ORDER — PHENYLEPHRINE HCL-NACL 20-0.9 MG/250ML-% IV SOLN
30.0000 ug/min | INTRAVENOUS | Status: AC
  Administered 2021-11-24: 25 ug/min via INTRAVENOUS
  Filled 2021-11-23: qty 250

## 2021-11-23 MED ORDER — POTASSIUM CHLORIDE 2 MEQ/ML IV SOLN
80.0000 meq | INTRAVENOUS | Status: DC
  Filled 2021-11-23: qty 40

## 2021-11-23 MED ORDER — PLASMA-LYTE A IV SOLN
INTRAVENOUS | Status: DC
  Filled 2021-11-23: qty 2.5

## 2021-11-23 MED ORDER — EPINEPHRINE HCL 5 MG/250ML IV SOLN IN NS
0.0000 ug/min | INTRAVENOUS | Status: DC
  Filled 2021-11-23: qty 250

## 2021-11-23 MED ORDER — MAGNESIUM SULFATE 50 % IJ SOLN
40.0000 meq | INTRAMUSCULAR | Status: DC
  Filled 2021-11-23: qty 9.85

## 2021-11-23 MED ORDER — TRANEXAMIC ACID (OHS) PUMP PRIME SOLUTION
2.0000 mg/kg | INTRAVENOUS | Status: DC
  Filled 2021-11-23: qty 2.9

## 2021-11-23 MED ORDER — TRANEXAMIC ACID 1000 MG/10ML IV SOLN
1.5000 mg/kg/h | INTRAVENOUS | Status: AC
  Administered 2021-11-24: 1.5 mg/kg/h via INTRAVENOUS
  Filled 2021-11-23: qty 25

## 2021-11-23 MED ORDER — CEFAZOLIN IN SODIUM CHLORIDE 3-0.9 GM/100ML-% IV SOLN
3.0000 g | INTRAVENOUS | Status: AC
  Administered 2021-11-24 (×2): 3 g via INTRAVENOUS
  Filled 2021-11-23: qty 100

## 2021-11-23 MED ORDER — MILRINONE LACTATE IN DEXTROSE 20-5 MG/100ML-% IV SOLN
0.3000 ug/kg/min | INTRAVENOUS | Status: DC
  Filled 2021-11-23: qty 100

## 2021-11-23 MED ORDER — HEPARIN 30,000 UNITS/1000 ML (OHS) CELLSAVER SOLUTION
Status: DC
  Filled 2021-11-23: qty 1000

## 2021-11-23 NOTE — Anesthesia Preprocedure Evaluation (Addendum)
Anesthesia Evaluation  Patient identified by MRN, date of birth, ID band Patient awake    Reviewed: Allergy & Precautions, H&P , NPO status , Patient's Chart, lab work & pertinent test results  Airway Mallampati: II   Neck ROM: full    Dental   Pulmonary sleep apnea ,    breath sounds clear to auscultation       Cardiovascular hypertension, + Valvular Problems/Murmurs AI  Rhythm:regular Rate:Normal  EF 65%. Mod-severe AI.  Mild CAD on cath   Neuro/Psych  Neuromuscular disease    GI/Hepatic GERD  ,  Endo/Other  diabetes, Type 2Hypothyroidism Morbid obesity  Renal/GU      Musculoskeletal  (+) Arthritis , Fibromyalgia -  Abdominal   Peds  Hematology PLTS 89   Anesthesia Other Findings   Reproductive/Obstetrics                            Anesthesia Physical Anesthesia Plan  ASA: 3  Anesthesia Plan: General   Post-op Pain Management:    Induction: Intravenous  PONV Risk Score and Plan: 3 and Ondansetron, Dexamethasone, Midazolam and Treatment may vary due to age or medical condition  Airway Management Planned: Oral ETT  Additional Equipment: Arterial line, CVP, PA Cath, TEE and Ultrasound Guidance Line Placement  Intra-op Plan:   Post-operative Plan: Post-operative intubation/ventilation  Informed Consent: I have reviewed the patients History and Physical, chart, labs and discussed the procedure including the risks, benefits and alternatives for the proposed anesthesia with the patient or authorized representative who has indicated his/her understanding and acceptance.     Dental advisory given  Plan Discussed with: CRNA, Anesthesiologist and Surgeon  Anesthesia Plan Comments: (See APP note by Durel Salts, FNP )       Anesthesia Quick Evaluation

## 2021-11-23 NOTE — Progress Notes (Addendum)
Anesthesia Chart Review:   Case: [id number] Date/Time: 11/24/21 0715   Procedures:      AORTIC VALVE REPLACEMENT (AVR) (Chest)     TRANSESOPHAGEAL ECHOCARDIOGRAM (TEE)   Anesthesia type: General   Pre-op diagnosis: SEVERE AI   Location: [facility] OR ROOM 70 / [facility]   Surgeons: Dahlia Byes, MD       DISCUSSION: Pt is 66 years old with hx ITP, HTN, DM, OSA, anemia  Platelets 78. From hematologist Dr. Bobby Rumpf' 10/13/21 note: "She is clinically behaving as if she has ITP.  As long as her platelet count remains well above 20,000 and she has no underlying bleeding/bruising issues, her thrombocytopenia continue to be followed conservatively."  - Dr. Prescott Gum has reviewed labs including platelet count of 78 and is aware of her ITP history. Per Thurmond Butts in his office, Dr. Bobby Rumpf advised pt start decadron and she was started on this 11/22/21. Dr. Prescott Gum has ordered CBC be rechecked day of surgery.    VS: BP (!) 151/52   Pulse 62   Temp 36.6 C (Oral)   Resp 18   Ht '5\' 8"'$  (1.727 m)   Wt (!) 145.2 kg   SpO2 99%   BMI 48.66 kg/m   PROVIDERS: - PCP is Ronita Hipps, MD - Cardiologist is Adrian Prows, MD - Hematologist is Lavera Guise, MD. Last office visit 10/13/21  LABS: - Platelets 78 - Glucose 241. HbA1c 4.9  (all labs ordered are listed, but only abnormal results are displayed)  Labs Reviewed  CBC - Abnormal; Notable for the following components:      Result Value   WBC 3.3 (*)    RBC 3.74 (*)    Hemoglobin 11.7 (*)    HCT 35.4 (*)    Platelets 78 (*)    All other components within normal limits  COMPREHENSIVE METABOLIC PANEL - Abnormal; Notable for the following components:   Chloride 117 (*)    CO2 16 (*)    Glucose, Bld 241 (*)    BUN 27 (*)    Creatinine, Ser 1.11 (*)    Total Protein 5.3 (*)    Albumin 2.9 (*)    GFR, Estimated 54 (*)    All other components within normal limits  URINALYSIS, ROUTINE W REFLEX MICROSCOPIC - Abnormal; Notable for the following components:    Color, Urine AMBER (*)    APPearance HAZY (*)    Hgb urine dipstick SMALL (*)    Leukocytes,Ua SMALL (*)    Bacteria, UA FEW (*)    All other components within normal limits  BLOOD GAS, ARTERIAL - Abnormal; Notable for the following components:   Acid-base deficit 2.3 (*)    All other components within normal limits  GLUCOSE, CAPILLARY - Abnormal; Notable for the following components:   Glucose-Capillary 216 (*)    All other components within normal limits  SARS CORONAVIRUS 2 BY RT PCR  SURGICAL PCR SCREEN  PROTIME-INR  APTT  HEMOGLOBIN A1C  TYPE AND SCREEN     IMAGES: CXR 11/22/21:  - No active cardiopulmonary disease  CT angio chest 10/07/21:  1. Fusiform aneurysmal dilatation of the ascending aorta, maximal dimension 4.2 cm. Recommend annual imaging followup by CTA or MRA. 2. Mild aortic atherosclerosis. 3. Tiny hiatal hernia.  EKG 11/08/21: Sinus bradycardia. LAFB   CV: Echo 11/22/21: results pending  Cardiac cath 11/08/21:  - LV: 134/9, EDP 21 mmHg.  Ao 130/52, mean 97 mmHg.  Pulse pressure 82 mmHg.  Moderately elevated  LVEDP. - LM: Large vessel: Smooth and normal. - LAD: Large caliber vessel.  Gives origin to a moderate-sized D1 which immediately bifurcates into secondary branch.  Ostium has 85% focal stenosis.  D2 is moderate size, mild disease.  Mid segment of the LAD after D2 has 10 to 20% stenosis. - CX: Gives origin to a small high OM1 followed by a moderate to large sized OM 2 and small OM 3 and OM 4.  AV groove circumflex is small to moderate-sized vessel with the ostium 10 to 20% stenosis. - RCA: Dominant.  Mildly ectatic in the proximal segment.  Mild disease of 10 to 20% in the midsegment.  Slow flow in the right coronary artery. - Ascending aortogram: There is moderate dilatation of the ascending aorta.  Aortic regurgitation is evident, severity was not assessed as it was a hand contrast injection. - Right heart: RA 12/9, mean 8 mmHg RV 32/3, EDP 16 mmHg PA  27/6, mean 16 mmHg PA saturation 80%. PW: Not obtained as I had difficulty in advancing the PA catheter to the wedge position due to tortuosity of the peripheral vessels. CO 11.19 and CI 4.57.  QP/QS 1.00.   Past Medical History:  Diagnosis Date   Abnormal myocardial perfusion study 09/26/2019   Anemia    Angina pectoris ([place]) - Class II-III 09/26/2019   Arthritis    KNEES   Benign neoplasm of ascending colon    Benign neoplasm of cecum    Benign neoplasm of descending colon    Benign neoplasm of sigmoid colon    Benign neoplasm of transverse colon    Bowel habit changes    Cataract    BILATERAL-REMOVED   Chronic postoperative pain 12/02/2012   Diabetes mellitus without complication (HCC)    Disturbance of skin sensation 12/02/2012   Fibromyalgia    GERD (gastroesophageal reflux disease)    History of colonic polyps    History of kidney stones    Hypertension    Hypothyroidism    Myalgia and myositis, unspecified 12/02/2012   Personal history of colonic polyps    Pneumonia    Primary hypothyroidism 11/30/2015   Senile nuclear sclerosis 04/24/2014   Sleep apnea    uses c-pap   Thyroid disease    Thyroid nodule 11/30/2015   Type 2 diabetes mellitus without complications ([place]) 62/70/3500   Vitamin D deficiency 11/30/2015    Past Surgical History:  Procedure Laterality Date   ABDOMINAL HYSTERECTOMY     APPENDECTOMY     CHOLECYSTECTOMY     COLONOSCOPY N/A 07/27/2015   Procedure: COLONOSCOPY;  Surgeon: Irene Shipper, MD;  Location: [facility];  Service: Endoscopy;  Laterality: N/A;   COLONOSCOPY WITH PROPOFOL N/A 04/28/2019   Procedure: COLONOSCOPY WITH PROPOFOL;  Surgeon: Irene Shipper, MD;  Location: [facility];  Service: Endoscopy;  Laterality: N/A;   EYE SURGERY     bilateral cataracts with lens implants   floater bone surgery     bone removed from right hand   FOOT SURGERY     RIGHT   FOOT SURGERY Left    triple orthodesis   left foot surgery     removed  tendon and placed pins in foot   LEFT HEART CATH AND CORONARY ANGIOGRAPHY N/A 09/26/2019   Procedure: LEFT HEART CATH AND CORONARY ANGIOGRAPHY;  Surgeon: Leonie Man, MD;  Location: [facility];  Service: Cardiovascular;  Laterality: N/A;   PLANTAR FASCIA SURGERY     both feet  POLYPECTOMY  04/28/2019   Procedure: POLYPECTOMY;  Surgeon: Irene Shipper, MD;  Location: [facility];  Service: Endoscopy;;   RIGHT HEART CATH N/A 02/15/2021   Procedure: RIGHT HEART CATH;  Surgeon: Nigel Mormon, MD;  Location: [facility];  Service: Cardiovascular;  Laterality: N/A;   RIGHT/LEFT HEART CATH AND CORONARY ANGIOGRAPHY N/A 11/08/2021   Procedure: RIGHT/LEFT HEART CATH AND CORONARY ANGIOGRAPHY;  Surgeon: Adrian Prows, MD;  Location: [facility];  Service: Cardiovascular;  Laterality: N/A;   TONSILLECTOMY      MEDICATIONS:  acetaminophen (TYLENOL) 500 MG tablet   budesonide-formoterol (SYMBICORT) 80-4.5 MCG/ACT inhaler   dexamethasone 20 MG TABS   diclofenac (CATAFLAM) 50 MG tablet   famotidine (PEPCID) 40 MG tablet   hydrochlorothiazide (MICROZIDE) 12.5 MG capsule   isosorbide mononitrate (IMDUR) 120 MG 24 hr tablet   levothyroxine (SYNTHROID, LEVOTHROID) 175 MCG tablet   lidocaine (LIDODERM) 5 %   linaclotide (LINZESS) 290 MCG CAPS capsule   losartan (COZAAR) 50 MG tablet   nitroGLYCERIN (NITROSTAT) 0.4 MG SL tablet   omeprazole (PRILOSEC) 40 MG capsule   polyethylene glycol (MIRALAX / GLYCOLAX) 17 g packet   rosuvastatin (CRESTOR) 5 MG tablet   No current facility-administered medications for this encounter.    If no changes, I anticipate pt can proceed with surgery as scheduled.   Willeen Cass, PhD, FNP-BC [facility] Stay [facility]/Anesthesiology Phone: [phone] 11/23/2021 8:47 AM

## 2021-11-24 ENCOUNTER — Inpatient Hospital Stay (HOSPITAL_COMMUNITY): Payer: HMO

## 2021-11-24 ENCOUNTER — Inpatient Hospital Stay (HOSPITAL_COMMUNITY): Payer: HMO | Admitting: Certified Registered"

## 2021-11-24 ENCOUNTER — Encounter (HOSPITAL_COMMUNITY)
Admission: RE | Disposition: A | Discharge status: Home Health | Payer: Self-pay | Source: Home / Self Care | Attending: Cardiothoracic Surgery

## 2021-11-24 ENCOUNTER — Encounter (HOSPITAL_COMMUNITY): Payer: Self-pay | Admitting: Cardiothoracic Surgery

## 2021-11-24 ENCOUNTER — Other Ambulatory Visit: Payer: Self-pay

## 2021-11-24 ENCOUNTER — Inpatient Hospital Stay (HOSPITAL_COMMUNITY)
Admission: RE | Admit: 2021-11-24 | Discharge: 2021-11-30 | DRG: 220 | Disposition: A | Discharge status: Home Health | Payer: HMO | Attending: Cardiothoracic Surgery | Admitting: Cardiothoracic Surgery

## 2021-11-24 ENCOUNTER — Inpatient Hospital Stay (HOSPITAL_COMMUNITY): Payer: HMO | Admitting: Emergency Medicine

## 2021-11-24 DIAGNOSIS — E559 Vitamin D deficiency, unspecified: Secondary | ICD-10-CM | POA: Diagnosis present

## 2021-11-24 DIAGNOSIS — K449 Diaphragmatic hernia without obstruction or gangrene: Secondary | ICD-10-CM | POA: Diagnosis present

## 2021-11-24 DIAGNOSIS — D689 Coagulation defect, unspecified: Secondary | ICD-10-CM | POA: Diagnosis present

## 2021-11-24 DIAGNOSIS — I517 Cardiomegaly: Secondary | ICD-10-CM | POA: Diagnosis not present

## 2021-11-24 DIAGNOSIS — M170 Bilateral primary osteoarthritis of knee: Secondary | ICD-10-CM | POA: Diagnosis present

## 2021-11-24 DIAGNOSIS — J811 Chronic pulmonary edema: Secondary | ICD-10-CM | POA: Diagnosis not present

## 2021-11-24 DIAGNOSIS — Z953 Presence of xenogenic heart valve: Secondary | ICD-10-CM

## 2021-11-24 DIAGNOSIS — M797 Fibromyalgia: Secondary | ICD-10-CM | POA: Diagnosis present

## 2021-11-24 DIAGNOSIS — I10 Essential (primary) hypertension: Secondary | ICD-10-CM | POA: Diagnosis present

## 2021-11-24 DIAGNOSIS — Z8701 Personal history of pneumonia (recurrent): Secondary | ICD-10-CM

## 2021-11-24 DIAGNOSIS — I272 Pulmonary hypertension, unspecified: Secondary | ICD-10-CM | POA: Diagnosis present

## 2021-11-24 DIAGNOSIS — Z86010 Personal history of colonic polyps: Secondary | ICD-10-CM | POA: Diagnosis not present

## 2021-11-24 DIAGNOSIS — I083 Combined rheumatic disorders of mitral, aortic and tricuspid valves: Secondary | ICD-10-CM | POA: Diagnosis not present

## 2021-11-24 DIAGNOSIS — R001 Bradycardia, unspecified: Secondary | ICD-10-CM | POA: Diagnosis present

## 2021-11-24 DIAGNOSIS — Z862 Personal history of diseases of the blood and blood-forming organs and certain disorders involving the immune mechanism: Secondary | ICD-10-CM | POA: Diagnosis not present

## 2021-11-24 DIAGNOSIS — K219 Gastro-esophageal reflux disease without esophagitis: Secondary | ICD-10-CM | POA: Diagnosis present

## 2021-11-24 DIAGNOSIS — Z794 Long term (current) use of insulin: Secondary | ICD-10-CM

## 2021-11-24 DIAGNOSIS — J90 Pleural effusion, not elsewhere classified: Secondary | ICD-10-CM | POA: Diagnosis not present

## 2021-11-24 DIAGNOSIS — E8770 Fluid overload, unspecified: Secondary | ICD-10-CM | POA: Diagnosis not present

## 2021-11-24 DIAGNOSIS — I351 Nonrheumatic aortic (valve) insufficiency: Secondary | ICD-10-CM

## 2021-11-24 DIAGNOSIS — E119 Type 2 diabetes mellitus without complications: Secondary | ICD-10-CM | POA: Diagnosis present

## 2021-11-24 DIAGNOSIS — Z6841 Body Mass Index (BMI) 40.0 and over, adult: Secondary | ICD-10-CM | POA: Diagnosis not present

## 2021-11-24 DIAGNOSIS — D62 Acute posthemorrhagic anemia: Secondary | ICD-10-CM | POA: Diagnosis not present

## 2021-11-24 DIAGNOSIS — E039 Hypothyroidism, unspecified: Secondary | ICD-10-CM | POA: Diagnosis present

## 2021-11-24 DIAGNOSIS — I77810 Thoracic aortic ectasia: Secondary | ICD-10-CM | POA: Diagnosis not present

## 2021-11-24 DIAGNOSIS — R008 Other abnormalities of heart beat: Secondary | ICD-10-CM | POA: Diagnosis not present

## 2021-11-24 DIAGNOSIS — Z90710 Acquired absence of both cervix and uterus: Secondary | ICD-10-CM

## 2021-11-24 DIAGNOSIS — I454 Nonspecific intraventricular block: Secondary | ICD-10-CM | POA: Diagnosis present

## 2021-11-24 DIAGNOSIS — Z7951 Long term (current) use of inhaled steroids: Secondary | ICD-10-CM

## 2021-11-24 DIAGNOSIS — Z20822 Contact with and (suspected) exposure to covid-19: Secondary | ICD-10-CM | POA: Diagnosis present

## 2021-11-24 DIAGNOSIS — I700 Atherosclerosis of aorta: Secondary | ICD-10-CM | POA: Diagnosis present

## 2021-11-24 DIAGNOSIS — G4733 Obstructive sleep apnea (adult) (pediatric): Secondary | ICD-10-CM | POA: Diagnosis present

## 2021-11-24 DIAGNOSIS — R079 Chest pain, unspecified: Secondary | ICD-10-CM | POA: Diagnosis not present

## 2021-11-24 DIAGNOSIS — I7121 Aneurysm of the ascending aorta, without rupture: Secondary | ICD-10-CM | POA: Diagnosis present

## 2021-11-24 DIAGNOSIS — Z87442 Personal history of urinary calculi: Secondary | ICD-10-CM

## 2021-11-24 DIAGNOSIS — D693 Immune thrombocytopenic purpura: Secondary | ICD-10-CM | POA: Diagnosis present

## 2021-11-24 DIAGNOSIS — J9811 Atelectasis: Secondary | ICD-10-CM | POA: Diagnosis not present

## 2021-11-24 DIAGNOSIS — Z452 Encounter for adjustment and management of vascular access device: Secondary | ICD-10-CM | POA: Diagnosis not present

## 2021-11-24 DIAGNOSIS — R918 Other nonspecific abnormal finding of lung field: Secondary | ICD-10-CM | POA: Diagnosis not present

## 2021-11-24 DIAGNOSIS — G8928 Other chronic postprocedural pain: Secondary | ICD-10-CM | POA: Diagnosis present

## 2021-11-24 DIAGNOSIS — J939 Pneumothorax, unspecified: Secondary | ICD-10-CM | POA: Diagnosis not present

## 2021-11-24 DIAGNOSIS — Z79890 Hormone replacement therapy (postmenopausal): Secondary | ICD-10-CM

## 2021-11-24 HISTORY — PX: AORTIC VALVE REPLACEMENT: SHX41

## 2021-11-24 HISTORY — PX: TEE WITHOUT CARDIOVERSION: SHX5443

## 2021-11-24 LAB — POCT I-STAT 7, (LYTES, BLD GAS, ICA,H+H)
Acid-base deficit: 4 mmol/L — ABNORMAL HIGH (ref 0.0–2.0)
Acid-base deficit: 3 mmol/L — ABNORMAL HIGH (ref 0.0–2.0)
Acid-base deficit: 4 mmol/L — ABNORMAL HIGH (ref 0.0–2.0)
Acid-base deficit: 5 mmol/L — ABNORMAL HIGH (ref 0.0–2.0)
Acid-base deficit: 4 mmol/L — ABNORMAL HIGH (ref 0.0–2.0)
Acid-base deficit: 2 mmol/L (ref 0.0–2.0)
Acid-base deficit: 2 mmol/L (ref 0.0–2.0)
Bicarbonate: 22.3 mmol/L (ref 20.0–28.0)
Bicarbonate: 22.5 mmol/L (ref 20.0–28.0)
Bicarbonate: 21.6 mmol/L (ref 20.0–28.0)
Bicarbonate: 21.3 mmol/L (ref 20.0–28.0)
Bicarbonate: 22.6 mmol/L (ref 20.0–28.0)
Bicarbonate: 22.6 mmol/L (ref 20.0–28.0)
Bicarbonate: 22.6 mmol/L (ref 20.0–28.0)
Calcium, Ion: 1.37 mmol/L (ref 1.15–1.40)
Calcium, Ion: 1.08 mmol/L — ABNORMAL LOW (ref 1.15–1.40)
Calcium, Ion: 1.24 mmol/L (ref 1.15–1.40)
Calcium, Ion: 1.28 mmol/L (ref 1.15–1.40)
Calcium, Ion: 1.23 mmol/L (ref 1.15–1.40)
Calcium, Ion: 1.27 mmol/L (ref 1.15–1.40)
Calcium, Ion: 1.26 mmol/L (ref 1.15–1.40)
HCT: 31 % — ABNORMAL LOW (ref 36.0–46.0)
HCT: 23 % — ABNORMAL LOW (ref 36.0–46.0)
HCT: 22 % — ABNORMAL LOW (ref 36.0–46.0)
HCT: 24 % — ABNORMAL LOW (ref 36.0–46.0)
HCT: 26 % — ABNORMAL LOW (ref 36.0–46.0)
HCT: 23 % — ABNORMAL LOW (ref 36.0–46.0)
HCT: 24 % — ABNORMAL LOW (ref 36.0–46.0)
Hemoglobin: 10.5 g/dL — ABNORMAL LOW (ref 12.0–15.0)
Hemoglobin: 7.8 g/dL — ABNORMAL LOW (ref 12.0–15.0)
Hemoglobin: 7.5 g/dL — ABNORMAL LOW (ref 12.0–15.0)
Hemoglobin: 8.2 g/dL — ABNORMAL LOW (ref 12.0–15.0)
Hemoglobin: 8.8 g/dL — ABNORMAL LOW (ref 12.0–15.0)
Hemoglobin: 7.8 g/dL — ABNORMAL LOW (ref 12.0–15.0)
Hemoglobin: 8.2 g/dL — ABNORMAL LOW (ref 12.0–15.0)
O2 Saturation: 100 %
O2 Saturation: 100 %
O2 Saturation: 100 %
O2 Saturation: 100 %
O2 Saturation: 96 %
O2 Saturation: 98 %
O2 Saturation: 93 %
Patient temperature: 34.9
Patient temperature: 36.4
Patient temperature: 36
Potassium: 4.1 mmol/L (ref 3.5–5.1)
Potassium: 4.1 mmol/L (ref 3.5–5.1)
Potassium: 4.3 mmol/L (ref 3.5–5.1)
Potassium: 4.3 mmol/L (ref 3.5–5.1)
Potassium: 4.2 mmol/L (ref 3.5–5.1)
Potassium: 4.2 mmol/L (ref 3.5–5.1)
Potassium: 4.3 mmol/L (ref 3.5–5.1)
Sodium: 141 mmol/L (ref 135–145)
Sodium: 141 mmol/L (ref 135–145)
Sodium: 139 mmol/L (ref 135–145)
Sodium: 141 mmol/L (ref 135–145)
Sodium: 143 mmol/L (ref 135–145)
Sodium: 142 mmol/L (ref 135–145)
Sodium: 142 mmol/L (ref 135–145)
TCO2: 24 mmol/L (ref 22–32)
TCO2: 24 mmol/L (ref 22–32)
TCO2: 23 mmol/L (ref 22–32)
TCO2: 23 mmol/L (ref 22–32)
TCO2: 24 mmol/L (ref 22–32)
TCO2: 24 mmol/L (ref 22–32)
TCO2: 24 mmol/L (ref 22–32)
pCO2 arterial: 43.2 mmHg (ref 32–48)
pCO2 arterial: 43.9 mmHg (ref 32–48)
pCO2 arterial: 39.8 mmHg (ref 32–48)
pCO2 arterial: 44.7 mmHg (ref 32–48)
pCO2 arterial: 42.5 mmHg (ref 32–48)
pCO2 arterial: 34.7 mmHg (ref 32–48)
pCO2 arterial: 33.9 mmHg (ref 32–48)
pH, Arterial: 7.32 — ABNORMAL LOW (ref 7.35–7.45)
pH, Arterial: 7.318 — ABNORMAL LOW (ref 7.35–7.45)
pH, Arterial: 7.343 — ABNORMAL LOW (ref 7.35–7.45)
pH, Arterial: 7.286 — ABNORMAL LOW (ref 7.35–7.45)
pH, Arterial: 7.323 — ABNORMAL LOW (ref 7.35–7.45)
pH, Arterial: 7.419 (ref 7.35–7.45)
pH, Arterial: 7.429 (ref 7.35–7.45)
pO2, Arterial: 275 mmHg — ABNORMAL HIGH (ref 83–108)
pO2, Arterial: 512 mmHg — ABNORMAL HIGH (ref 83–108)
pO2, Arterial: 443 mmHg — ABNORMAL HIGH (ref 83–108)
pO2, Arterial: 328 mmHg — ABNORMAL HIGH (ref 83–108)
pO2, Arterial: 83 mmHg (ref 83–108)
pO2, Arterial: 99 mmHg (ref 83–108)
pO2, Arterial: 61 mmHg — ABNORMAL LOW (ref 83–108)

## 2021-11-24 LAB — POCT I-STAT, CHEM 8
BUN: 30 mg/dL — ABNORMAL HIGH (ref 8–23)
BUN: 31 mg/dL — ABNORMAL HIGH (ref 8–23)
BUN: 31 mg/dL — ABNORMAL HIGH (ref 8–23)
BUN: 31 mg/dL — ABNORMAL HIGH (ref 8–23)
BUN: 31 mg/dL — ABNORMAL HIGH (ref 8–23)
Calcium, Ion: 1.37 mmol/L (ref 1.15–1.40)
Calcium, Ion: 1.25 mmol/L (ref 1.15–1.40)
Calcium, Ion: 1.37 mmol/L (ref 1.15–1.40)
Calcium, Ion: 1.24 mmol/L (ref 1.15–1.40)
Calcium, Ion: 1.27 mmol/L (ref 1.15–1.40)
Chloride: 108 mmol/L (ref 98–111)
Chloride: 107 mmol/L (ref 98–111)
Chloride: 110 mmol/L (ref 98–111)
Chloride: 110 mmol/L (ref 98–111)
Chloride: 109 mmol/L (ref 98–111)
Creatinine, Ser: 1.1 mg/dL — ABNORMAL HIGH (ref 0.44–1.00)
Creatinine, Ser: 1 mg/dL (ref 0.44–1.00)
Creatinine, Ser: 1.2 mg/dL — ABNORMAL HIGH (ref 0.44–1.00)
Creatinine, Ser: 1.1 mg/dL — ABNORMAL HIGH (ref 0.44–1.00)
Creatinine, Ser: 1.1 mg/dL — ABNORMAL HIGH (ref 0.44–1.00)
Glucose, Bld: 276 mg/dL — ABNORMAL HIGH (ref 70–99)
Glucose, Bld: 204 mg/dL — ABNORMAL HIGH (ref 70–99)
Glucose, Bld: 234 mg/dL — ABNORMAL HIGH (ref 70–99)
Glucose, Bld: 188 mg/dL — ABNORMAL HIGH (ref 70–99)
Glucose, Bld: 184 mg/dL — ABNORMAL HIGH (ref 70–99)
HCT: 29 % — ABNORMAL LOW (ref 36.0–46.0)
HCT: 24 % — ABNORMAL LOW (ref 36.0–46.0)
HCT: 28 % — ABNORMAL LOW (ref 36.0–46.0)
HCT: 22 % — ABNORMAL LOW (ref 36.0–46.0)
HCT: 24 % — ABNORMAL LOW (ref 36.0–46.0)
Hemoglobin: 9.9 g/dL — ABNORMAL LOW (ref 12.0–15.0)
Hemoglobin: 8.2 g/dL — ABNORMAL LOW (ref 12.0–15.0)
Hemoglobin: 9.5 g/dL — ABNORMAL LOW (ref 12.0–15.0)
Hemoglobin: 7.5 g/dL — ABNORMAL LOW (ref 12.0–15.0)
Hemoglobin: 8.2 g/dL — ABNORMAL LOW (ref 12.0–15.0)
Potassium: 4 mmol/L (ref 3.5–5.1)
Potassium: 3.9 mmol/L (ref 3.5–5.1)
Potassium: 4.6 mmol/L (ref 3.5–5.1)
Potassium: 5 mmol/L (ref 3.5–5.1)
Potassium: 4.2 mmol/L (ref 3.5–5.1)
Sodium: 141 mmol/L (ref 135–145)
Sodium: 140 mmol/L (ref 135–145)
Sodium: 141 mmol/L (ref 135–145)
Sodium: 140 mmol/L (ref 135–145)
Sodium: 142 mmol/L (ref 135–145)
TCO2: 23 mmol/L (ref 22–32)
TCO2: 21 mmol/L — ABNORMAL LOW (ref 22–32)
TCO2: 23 mmol/L (ref 22–32)
TCO2: 21 mmol/L — ABNORMAL LOW (ref 22–32)
TCO2: 22 mmol/L (ref 22–32)

## 2021-11-24 LAB — HEMOGLOBIN AND HEMATOCRIT, BLOOD
HCT: 23 % — ABNORMAL LOW (ref 36.0–46.0)
Hemoglobin: 8 g/dL — ABNORMAL LOW (ref 12.0–15.0)

## 2021-11-24 LAB — POCT I-STAT EG7
Acid-base deficit: 3 mmol/L — ABNORMAL HIGH (ref 0.0–2.0)
Bicarbonate: 23.4 mmol/L (ref 20.0–28.0)
Calcium, Ion: 1.23 mmol/L (ref 1.15–1.40)
HCT: 24 % — ABNORMAL LOW (ref 36.0–46.0)
Hemoglobin: 8.2 g/dL — ABNORMAL LOW (ref 12.0–15.0)
O2 Saturation: 83 %
Potassium: 3.8 mmol/L (ref 3.5–5.1)
Sodium: 143 mmol/L (ref 135–145)
TCO2: 25 mmol/L (ref 22–32)
pCO2, Ven: 49.3 mmHg (ref 44–60)
pH, Ven: 7.285 (ref 7.25–7.43)
pO2, Ven: 54 mmHg — ABNORMAL HIGH (ref 32–45)

## 2021-11-24 LAB — CBC
HCT: 32.8 % — ABNORMAL LOW (ref 36.0–46.0)
HCT: 26.7 % — ABNORMAL LOW (ref 36.0–46.0)
HCT: 26.1 % — ABNORMAL LOW (ref 36.0–46.0)
Hemoglobin: 11.8 g/dL — ABNORMAL LOW (ref 12.0–15.0)
Hemoglobin: 8.9 g/dL — ABNORMAL LOW (ref 12.0–15.0)
Hemoglobin: 9 g/dL — ABNORMAL LOW (ref 12.0–15.0)
MCH: 33 pg (ref 26.0–34.0)
MCH: 31.2 pg (ref 26.0–34.0)
MCH: 32 pg (ref 26.0–34.0)
MCHC: 36 g/dL (ref 30.0–36.0)
MCHC: 33.3 g/dL (ref 30.0–36.0)
MCHC: 34.5 g/dL (ref 30.0–36.0)
MCV: 91.6 fL (ref 80.0–100.0)
MCV: 93.7 fL (ref 80.0–100.0)
MCV: 92.9 fL (ref 80.0–100.0)
Platelets: 89 10*3/uL — ABNORMAL LOW (ref 150–400)
Platelets: 73 10*3/uL — ABNORMAL LOW (ref 150–400)
Platelets: 75 10*3/uL — ABNORMAL LOW (ref 150–400)
RBC: 3.58 MIL/uL — ABNORMAL LOW (ref 3.87–5.11)
RBC: 2.85 MIL/uL — ABNORMAL LOW (ref 3.87–5.11)
RBC: 2.81 MIL/uL — ABNORMAL LOW (ref 3.87–5.11)
RDW: 13.2 % (ref 11.5–15.5)
RDW: 13.3 % (ref 11.5–15.5)
RDW: 13.4 % (ref 11.5–15.5)
WBC: 9.4 10*3/uL (ref 4.0–10.5)
WBC: 12.4 10*3/uL — ABNORMAL HIGH (ref 4.0–10.5)
WBC: 11 10*3/uL — ABNORMAL HIGH (ref 4.0–10.5)
nRBC: 0 % (ref 0.0–0.2)
nRBC: 0 % (ref 0.0–0.2)
nRBC: 0 % (ref 0.0–0.2)

## 2021-11-24 LAB — PROTIME-INR
INR: 1.5 — ABNORMAL HIGH (ref 0.8–1.2)
Prothrombin Time: 18 seconds — ABNORMAL HIGH (ref 11.4–15.2)

## 2021-11-24 LAB — PREPARE RBC (CROSSMATCH)

## 2021-11-24 LAB — BASIC METABOLIC PANEL
Anion gap: 7 (ref 5–15)
BUN: 29 mg/dL — ABNORMAL HIGH (ref 8–23)
CO2: 23 mmol/L (ref 22–32)
Calcium: 8.6 mg/dL — ABNORMAL LOW (ref 8.9–10.3)
Chloride: 112 mmol/L — ABNORMAL HIGH (ref 98–111)
Creatinine, Ser: 1.24 mg/dL — ABNORMAL HIGH (ref 0.44–1.00)
GFR, Estimated: 48 mL/min — ABNORMAL LOW (ref >60)
Glucose, Bld: 134 mg/dL — ABNORMAL HIGH (ref 70–99)
Potassium: 4.2 mmol/L (ref 3.5–5.1)
Sodium: 142 mmol/L (ref 135–145)

## 2021-11-24 LAB — GLUCOSE, CAPILLARY
Glucose-Capillary: 317 mg/dL — ABNORMAL HIGH (ref 70–99)
Glucose-Capillary: 157 mg/dL — ABNORMAL HIGH (ref 70–99)
Glucose-Capillary: 142 mg/dL — ABNORMAL HIGH (ref 70–99)
Glucose-Capillary: 129 mg/dL — ABNORMAL HIGH (ref 70–99)
Glucose-Capillary: 140 mg/dL — ABNORMAL HIGH (ref 70–99)
Glucose-Capillary: 131 mg/dL — ABNORMAL HIGH (ref 70–99)
Glucose-Capillary: 117 mg/dL — ABNORMAL HIGH (ref 70–99)
Glucose-Capillary: 133 mg/dL — ABNORMAL HIGH (ref 70–99)
Glucose-Capillary: 137 mg/dL — ABNORMAL HIGH (ref 70–99)

## 2021-11-24 LAB — FIBRINOGEN: Fibrinogen: 132 mg/dL — ABNORMAL LOW (ref 210–475)

## 2021-11-24 LAB — APTT: aPTT: 34 seconds (ref 24–36)

## 2021-11-24 LAB — MAGNESIUM: Magnesium: 3.4 mg/dL — ABNORMAL HIGH (ref 1.7–2.4)

## 2021-11-24 LAB — ABO/RH: ABO/RH(D): O NEG

## 2021-11-24 LAB — PLATELET COUNT: Platelets: 88 10*3/uL — ABNORMAL LOW (ref 150–400)

## 2021-11-24 SURGERY — REPLACEMENT, AORTIC VALVE, OPEN
Anesthesia: General | Site: Chest

## 2021-11-24 MED ORDER — LEVOTHYROXINE SODIUM 175 MCG PO TABS: 175.0000 ug | ORAL_TABLET | ORAL | Status: DC

## 2021-11-24 MED ORDER — LACTATED RINGERS IV SOLN: INTRAVENOUS | Status: DC | PRN

## 2021-11-24 MED ORDER — LACTATED RINGERS IV SOLN: 500.0000 mL | Freq: Once | INTRAVENOUS | Status: DC | PRN

## 2021-11-24 MED ORDER — ACETAMINOPHEN 650 MG RE SUPP
650.0000 mg | Freq: Once | RECTAL | Status: AC
  Administered 2021-11-24: 650 mg via RECTAL

## 2021-11-24 MED ORDER — MOMETASONE FURO-FORMOTEROL FUM 100-5 MCG/ACT IN AERO
2.0000 | INHALATION_SPRAY | Freq: Two times a day (BID) | RESPIRATORY_TRACT | Status: DC
  Administered 2021-11-24 – 2021-11-30 (×11): 2 via RESPIRATORY_TRACT
  Filled 2021-11-24: qty 8.8

## 2021-11-24 MED ORDER — SODIUM CHLORIDE (PF) 0.9 % IJ SOLN
INTRAMUSCULAR | Status: AC
  Filled 2021-11-24: qty 10

## 2021-11-24 MED ORDER — SODIUM CHLORIDE 0.9 % IV SOLN: 250.0000 mL | INTRAVENOUS | Status: DC

## 2021-11-24 MED ORDER — LEVOTHYROXINE SODIUM 75 MCG PO TABS
175.0000 ug | ORAL_TABLET | ORAL | Status: DC
  Administered 2021-11-25 – 2021-11-30 (×5): 175 ug via ORAL
  Filled 2021-11-24 (×5): qty 1

## 2021-11-24 MED ORDER — DEXTROSE 50 % IV SOLN: 0.0000 mL | INTRAVENOUS | Status: DC | PRN

## 2021-11-24 MED ORDER — MIDAZOLAM HCL (PF) 10 MG/2ML IJ SOLN
INTRAMUSCULAR | Status: AC
  Filled 2021-11-24: qty 2

## 2021-11-24 MED ORDER — TRANEXAMIC ACID 1000 MG/10ML IV SOLN
1.5000 mg/kg/h | INTRAVENOUS | Status: DC
  Filled 2021-11-24 (×2): qty 25

## 2021-11-24 MED ORDER — FENTANYL CITRATE (PF) 250 MCG/5ML IJ SOLN
INTRAMUSCULAR | Status: AC
  Filled 2021-11-24: qty 5

## 2021-11-24 MED ORDER — ACETAMINOPHEN 500 MG PO TABS
1000.0000 mg | ORAL_TABLET | Freq: Four times a day (QID) | ORAL | Status: AC
  Administered 2021-11-25 – 2021-11-29 (×17): 1000 mg via ORAL
  Filled 2021-11-24 (×18): qty 2

## 2021-11-24 MED ORDER — ROCURONIUM BROMIDE 10 MG/ML (PF) SYRINGE
PREFILLED_SYRINGE | INTRAVENOUS | Status: DC | PRN
  Administered 2021-11-24: 30 mg via INTRAVENOUS
  Administered 2021-11-24: 70 mg via INTRAVENOUS
  Administered 2021-11-24 (×3): 30 mg via INTRAVENOUS

## 2021-11-24 MED ORDER — PROTAMINE SULFATE 10 MG/ML IV SOLN
INTRAVENOUS | Status: DC | PRN
  Administered 2021-11-24: 300 mg via INTRAVENOUS

## 2021-11-24 MED ORDER — POTASSIUM CHLORIDE 10 MEQ/50ML IV SOLN: 10.0000 meq | INTRAVENOUS | Status: AC

## 2021-11-24 MED ORDER — INSULIN REGULAR(HUMAN) IN NACL 100-0.9 UT/100ML-% IV SOLN
INTRAVENOUS | Status: DC
  Administered 2021-11-24: 2.2 [IU]/h via INTRAVENOUS
  Filled 2021-11-24: qty 100

## 2021-11-24 MED ORDER — FAMOTIDINE IN NACL 20-0.9 MG/50ML-% IV SOLN
20.0000 mg | Freq: Two times a day (BID) | INTRAVENOUS | Status: DC
Start: 2021-11-24 — End: 2021-11-25
  Administered 2021-11-24: 20 mg via INTRAVENOUS
  Filled 2021-11-24: qty 50

## 2021-11-24 MED ORDER — BISACODYL 5 MG PO TBEC
10.0000 mg | DELAYED_RELEASE_TABLET | Freq: Every day | ORAL | Status: DC
  Administered 2021-11-26: 10 mg via ORAL
  Filled 2021-11-24: qty 2

## 2021-11-24 MED ORDER — HEMOSTATIC AGENTS (NO CHARGE) OPTIME
TOPICAL | Status: DC | PRN
  Administered 2021-11-24 (×2): 1 via TOPICAL

## 2021-11-24 MED ORDER — METOPROLOL TARTRATE 12.5 MG HALF TABLET
12.5000 mg | ORAL_TABLET | Freq: Two times a day (BID) | ORAL | Status: DC
  Administered 2021-11-25 – 2021-11-28 (×6): 12.5 mg via ORAL
  Filled 2021-11-24 (×7): qty 1

## 2021-11-24 MED ORDER — LEVOTHYROXINE SODIUM 75 MCG PO TABS
262.5000 ug | ORAL_TABLET | ORAL | Status: DC
  Administered 2021-11-29: 262.5 ug via ORAL
  Filled 2021-11-24: qty 4

## 2021-11-24 MED ORDER — HEPARIN SODIUM (PORCINE) 1000 UNIT/ML IJ SOLN
INTRAMUSCULAR | Status: AC
Start: 2021-11-24 — End: ?
  Filled 2021-11-24: qty 20

## 2021-11-24 MED ORDER — PROPOFOL 10 MG/ML IV BOLUS
INTRAVENOUS | Status: AC
  Filled 2021-11-24: qty 20

## 2021-11-24 MED ORDER — MIDAZOLAM HCL (PF) 5 MG/ML IJ SOLN
INTRAMUSCULAR | Status: DC | PRN
  Administered 2021-11-24 (×2): 2 mg via INTRAVENOUS
  Administered 2021-11-24 (×2): 1 mg via INTRAVENOUS
  Administered 2021-11-24 (×2): 2 mg via INTRAVENOUS

## 2021-11-24 MED ORDER — HEPARIN SODIUM (PORCINE) 1000 UNIT/ML IJ SOLN
INTRAMUSCULAR | Status: AC
Start: 2021-11-24 — End: ?
  Filled 2021-11-24: qty 1

## 2021-11-24 MED ORDER — ONDANSETRON HCL 4 MG/2ML IJ SOLN: 4.0000 mg | Freq: Four times a day (QID) | INTRAMUSCULAR | Status: DC | PRN

## 2021-11-24 MED ORDER — CEFAZOLIN SODIUM-DEXTROSE 2-4 GM/100ML-% IV SOLN
2.0000 g | Freq: Three times a day (TID) | INTRAVENOUS | Status: AC
  Administered 2021-11-24 – 2021-11-26 (×6): 2 g via INTRAVENOUS
  Filled 2021-11-24 (×6): qty 100

## 2021-11-24 MED ORDER — ORAL CARE MOUTH RINSE: 15.0000 mL | OROMUCOSAL | Status: DC | PRN

## 2021-11-24 MED ORDER — METOPROLOL TARTRATE 12.5 MG HALF TABLET
12.5000 mg | ORAL_TABLET | Freq: Once | ORAL | Status: DC
  Filled 2021-11-24: qty 1

## 2021-11-24 MED ORDER — DOCUSATE SODIUM 100 MG PO CAPS
200.0000 mg | ORAL_CAPSULE | Freq: Every day | ORAL | Status: DC
  Administered 2021-11-25 – 2021-11-26 (×2): 200 mg via ORAL
  Filled 2021-11-24 (×2): qty 2

## 2021-11-24 MED ORDER — LINACLOTIDE 145 MCG PO CAPS
290.0000 ug | ORAL_CAPSULE | Freq: Every day | ORAL | Status: DC
  Administered 2021-11-25 – 2021-11-26 (×2): 290 ug via ORAL
  Filled 2021-11-24 (×6): qty 2

## 2021-11-24 MED ORDER — ORAL CARE MOUTH RINSE
15.0000 mL | OROMUCOSAL | Status: DC
  Administered 2021-11-24 (×3): 15 mL via OROMUCOSAL

## 2021-11-24 MED ORDER — ARTIFICIAL TEARS OPHTHALMIC OINT
TOPICAL_OINTMENT | OPHTHALMIC | Status: AC
  Filled 2021-11-24: qty 3.5

## 2021-11-24 MED ORDER — SODIUM CHLORIDE (PF) 0.9 % IJ SOLN
OROMUCOSAL | Status: DC | PRN
  Administered 2021-11-24: 8 mL via TOPICAL
  Administered 2021-11-24 (×3): 4 mL via TOPICAL

## 2021-11-24 MED ORDER — ~~LOC~~ CARDIAC SURGERY, PATIENT & FAMILY EDUCATION
Freq: Once | Status: DC
  Filled 2021-11-24: qty 1

## 2021-11-24 MED ORDER — MAGNESIUM SULFATE 4 GM/100ML IV SOLN
4.0000 g | Freq: Once | INTRAVENOUS | Status: AC
  Administered 2021-11-24: 4 g via INTRAVENOUS
  Filled 2021-11-24: qty 100

## 2021-11-24 MED ORDER — CHLORHEXIDINE GLUCONATE 4 % EX LIQD: 30.0000 mL | CUTANEOUS | Status: DC

## 2021-11-24 MED ORDER — ALBUMIN HUMAN 5 % IV SOLN
250.0000 mL | INTRAVENOUS | Status: AC | PRN
  Administered 2021-11-24 (×2): 12.5 g via INTRAVENOUS

## 2021-11-24 MED ORDER — BISACODYL 10 MG RE SUPP
10.0000 mg | Freq: Every day | RECTAL | Status: DC
  Filled 2021-11-24: qty 1

## 2021-11-24 MED ORDER — SODIUM CHLORIDE 0.9% FLUSH: 10.0000 mL | INTRAVENOUS | Status: DC | PRN

## 2021-11-24 MED ORDER — POLYETHYLENE GLYCOL 3350 17 G PO PACK
17.0000 g | PACK | Freq: Every day | ORAL | Status: DC
Start: 2021-11-25 — End: 2021-11-30
  Administered 2021-11-25 – 2021-11-26 (×2): 17 g via ORAL
  Filled 2021-11-24 (×3): qty 1

## 2021-11-24 MED ORDER — LACTATED RINGERS IV SOLN: INTRAVENOUS | Status: DC

## 2021-11-24 MED ORDER — PLASMA-LYTE A IV SOLN
INTRAVENOUS | Status: DC | PRN
  Administered 2021-11-24: 500 mL via INTRAVASCULAR

## 2021-11-24 MED ORDER — EPHEDRINE SULFATE-NACL 50-0.9 MG/10ML-% IV SOSY
PREFILLED_SYRINGE | INTRAVENOUS | Status: DC | PRN
  Administered 2021-11-24: 10 mg via INTRAVENOUS

## 2021-11-24 MED ORDER — SODIUM CHLORIDE 0.45 % IV SOLN: INTRAVENOUS | Status: DC | PRN

## 2021-11-24 MED ORDER — MIDAZOLAM HCL 2 MG/2ML IJ SOLN: 2.0000 mg | INTRAMUSCULAR | Status: DC | PRN

## 2021-11-24 MED ORDER — PROTAMINE SULFATE 10 MG/ML IV SOLN
INTRAVENOUS | Status: AC
Start: 2021-11-24 — End: ?
  Filled 2021-11-24: qty 5

## 2021-11-24 MED ORDER — METOPROLOL TARTRATE 25 MG/10 ML ORAL SUSPENSION
12.5000 mg | Freq: Two times a day (BID) | ORAL | Status: DC
  Filled 2021-11-24: qty 10
  Filled 2021-11-24 (×4): qty 5

## 2021-11-24 MED ORDER — DEXMEDETOMIDINE HCL IN NACL 400 MCG/100ML IV SOLN: 0.0000 ug/kg/h | INTRAVENOUS | Status: DC

## 2021-11-24 MED ORDER — HEPARIN SODIUM (PORCINE) 1000 UNIT/ML IJ SOLN
INTRAMUSCULAR | Status: DC | PRN
  Administered 2021-11-24: 30000 [IU] via INTRAVENOUS

## 2021-11-24 MED ORDER — CHLORHEXIDINE GLUCONATE 0.12 % MT SOLN
15.0000 mL | OROMUCOSAL | Status: AC
  Administered 2021-11-24: 15 mL via OROMUCOSAL

## 2021-11-24 MED ORDER — PHENYLEPHRINE 80 MCG/ML (10ML) SYRINGE FOR IV PUSH (FOR BLOOD PRESSURE SUPPORT)
PREFILLED_SYRINGE | INTRAVENOUS | Status: DC | PRN
  Administered 2021-11-24: 80 ug via INTRAVENOUS

## 2021-11-24 MED ORDER — TRAMADOL HCL 50 MG PO TABS
50.0000 mg | ORAL_TABLET | ORAL | Status: DC | PRN
  Administered 2021-11-24 – 2021-11-25 (×4): 100 mg via ORAL
  Administered 2021-11-30: 50 mg via ORAL
  Filled 2021-11-24 (×4): qty 2
  Filled 2021-11-24: qty 1

## 2021-11-24 MED ORDER — METOCLOPRAMIDE HCL 5 MG/ML IJ SOLN
10.0000 mg | Freq: Four times a day (QID) | INTRAMUSCULAR | Status: AC
  Administered 2021-11-24 – 2021-11-26 (×8): 10 mg via INTRAVENOUS
  Filled 2021-11-24 (×8): qty 2

## 2021-11-24 MED ORDER — FAMOTIDINE IN NACL 20-0.9 MG/50ML-% IV SOLN: 20.0000 mg | Freq: Two times a day (BID) | INTRAVENOUS | Status: DC

## 2021-11-24 MED ORDER — LIDOCAINE 5 % EX PTCH
1.0000 | MEDICATED_PATCH | Freq: Every day | CUTANEOUS | Status: DC | PRN
Start: 2021-11-24 — End: 2021-11-30

## 2021-11-24 MED ORDER — 0.9 % SODIUM CHLORIDE (POUR BTL) OPTIME
TOPICAL | Status: DC | PRN
  Administered 2021-11-24: 1000 mL
  Administered 2021-11-24: 5000 mL

## 2021-11-24 MED ORDER — SODIUM CHLORIDE 0.9% IV SOLUTION: Freq: Once | INTRAVENOUS | Status: AC

## 2021-11-24 MED ORDER — SODIUM CHLORIDE 0.9 % IV SOLN: INTRAVENOUS | Status: DC

## 2021-11-24 MED ORDER — SODIUM CHLORIDE 0.9% FLUSH
3.0000 mL | Freq: Two times a day (BID) | INTRAVENOUS | Status: DC
  Administered 2021-11-25 – 2021-11-30 (×10): 3 mL via INTRAVENOUS

## 2021-11-24 MED ORDER — MORPHINE SULFATE (PF) 2 MG/ML IV SOLN
1.0000 mg | INTRAVENOUS | Status: DC | PRN
  Administered 2021-11-24 – 2021-11-25 (×3): 2 mg via INTRAVENOUS
  Filled 2021-11-24 (×4): qty 1

## 2021-11-24 MED ORDER — CHLORHEXIDINE GLUCONATE 0.12 % MT SOLN
15.0000 mL | Freq: Once | OROMUCOSAL | Status: AC
  Administered 2021-11-24: 15 mL via OROMUCOSAL
  Filled 2021-11-24: qty 15

## 2021-11-24 MED ORDER — PROTAMINE SULFATE 10 MG/ML IV SOLN
INTRAVENOUS | Status: AC
Start: 2021-11-24 — End: ?
  Filled 2021-11-24: qty 25

## 2021-11-24 MED ORDER — HEMOSTATIC AGENTS (NO CHARGE) OPTIME
TOPICAL | Status: DC | PRN
  Administered 2021-11-24: 1 via TOPICAL

## 2021-11-24 MED ORDER — ROSUVASTATIN CALCIUM 5 MG PO TABS
5.0000 mg | ORAL_TABLET | Freq: Every day | ORAL | Status: DC
  Administered 2021-11-25 – 2021-11-30 (×6): 5 mg via ORAL
  Filled 2021-11-24 (×6): qty 1

## 2021-11-24 MED ORDER — NITROGLYCERIN IN D5W 200-5 MCG/ML-% IV SOLN: 0.0000 ug/min | INTRAVENOUS | Status: DC

## 2021-11-24 MED ORDER — PANTOPRAZOLE SODIUM 40 MG PO TBEC
40.0000 mg | DELAYED_RELEASE_TABLET | Freq: Every day | ORAL | Status: DC
  Administered 2021-11-26 – 2021-11-30 (×5): 40 mg via ORAL
  Filled 2021-11-24 (×5): qty 1

## 2021-11-24 MED ORDER — SURGIFLO WITH THROMBIN (HEMOSTATIC MATRIX KIT) OPTIME
TOPICAL | Status: DC | PRN
  Administered 2021-11-24 (×2): 1 via TOPICAL

## 2021-11-24 MED ORDER — ROCURONIUM BROMIDE 10 MG/ML (PF) SYRINGE
PREFILLED_SYRINGE | INTRAVENOUS | Status: AC
  Filled 2021-11-24: qty 10

## 2021-11-24 MED ORDER — SODIUM CHLORIDE 0.9% FLUSH
10.0000 mL | Freq: Two times a day (BID) | INTRAVENOUS | Status: DC
  Administered 2021-11-24 – 2021-11-30 (×7): 10 mL

## 2021-11-24 MED ORDER — FENTANYL CITRATE (PF) 250 MCG/5ML IJ SOLN
INTRAMUSCULAR | Status: DC | PRN
Start: 2021-11-24 — End: 2021-11-24
  Administered 2021-11-24: 50 ug via INTRAVENOUS
  Administered 2021-11-24: 150 ug via INTRAVENOUS
  Administered 2021-11-24: 100 ug via INTRAVENOUS
  Administered 2021-11-24: 50 ug via INTRAVENOUS
  Administered 2021-11-24: 250 ug via INTRAVENOUS
  Administered 2021-11-24: 50 ug via INTRAVENOUS
  Administered 2021-11-24: 250 ug via INTRAVENOUS
  Administered 2021-11-24: 100 ug via INTRAVENOUS
  Administered 2021-11-24: 150 ug via INTRAVENOUS
  Administered 2021-11-24: 250 ug via INTRAVENOUS
  Administered 2021-11-24: 100 ug via INTRAVENOUS

## 2021-11-24 MED ORDER — ACETAMINOPHEN 160 MG/5ML PO SOLN: 650.0000 mg | Freq: Once | ORAL | Status: AC

## 2021-11-24 MED ORDER — VANCOMYCIN HCL 1500 MG/300ML IV SOLN
1500.0000 mg | Freq: Once | INTRAVENOUS | Status: AC
  Administered 2021-11-25: 1500 mg via INTRAVENOUS
  Filled 2021-11-24: qty 300

## 2021-11-24 MED ORDER — ARTIFICIAL TEARS OPHTHALMIC OINT
TOPICAL_OINTMENT | OPHTHALMIC | Status: DC | PRN
  Administered 2021-11-24: 1 via OPHTHALMIC

## 2021-11-24 MED ORDER — EPHEDRINE 5 MG/ML INJ
INTRAVENOUS | Status: AC
Start: 2021-11-24 — End: ?
  Filled 2021-11-24: qty 5

## 2021-11-24 MED ORDER — METOPROLOL TARTRATE 5 MG/5ML IV SOLN: 2.5000 mg | INTRAVENOUS | Status: DC | PRN

## 2021-11-24 MED ORDER — PHENYLEPHRINE HCL-NACL 20-0.9 MG/250ML-% IV SOLN: 0.0000 ug/min | INTRAVENOUS | Status: DC

## 2021-11-24 MED ORDER — SODIUM BICARBONATE 8.4 % IV SOLN
INTRAVENOUS | Status: DC | PRN
  Administered 2021-11-24: 50 meq via INTRAVENOUS

## 2021-11-24 MED ORDER — SODIUM CHLORIDE 0.9% FLUSH
3.0000 mL | INTRAVENOUS | Status: DC | PRN
  Administered 2021-11-26: 3 mL via INTRAVENOUS

## 2021-11-24 MED ORDER — CHLORHEXIDINE GLUCONATE CLOTH 2 % EX PADS
6.0000 | MEDICATED_PAD | Freq: Every day | CUTANEOUS | Status: DC
  Administered 2021-11-24 – 2021-11-30 (×7): 6 via TOPICAL

## 2021-11-24 MED ORDER — ACETAMINOPHEN 160 MG/5ML PO SOLN: 1000.0000 mg | Freq: Four times a day (QID) | ORAL | Status: AC

## 2021-11-24 MED ORDER — PROPOFOL 10 MG/ML IV BOLUS
INTRAVENOUS | Status: DC | PRN
  Administered 2021-11-24: 80 mg via INTRAVENOUS

## 2021-11-24 SURGICAL SUPPLY — 92 items
ADAPTER CARDIO PERF ANTE/RETRO (ADAPTER) ×4 IMPLANT
ADH SRG 12 PREFL SYR 3 SPRDR (MISCELLANEOUS)
ADPR PRFSN 84XANTGRD RTRGD (ADAPTER) ×2
AGENT HMST KT MTR STRL THRMB (HEMOSTASIS) ×4
BAG DECANTER FOR FLEXI CONT (MISCELLANEOUS) ×4 IMPLANT
BLADE CLIPPER SURG (BLADE) ×4 IMPLANT
BLADE STERNUM SYSTEM 6 (BLADE) ×4 IMPLANT
BLADE SURG 12 STRL SS (BLADE) ×4 IMPLANT
BLADE SURG 15 STRL LF DISP TIS (BLADE) ×3 IMPLANT
BLADE SURG 15 STRL SS (BLADE) ×6
CANISTER SUCT 3000ML PPV (MISCELLANEOUS) ×4 IMPLANT
CANNULA GUNDRY RCSP 15FR (MISCELLANEOUS) ×4 IMPLANT
CANNULA GUNDRY RETROGRADE 15FR (MISCELLANEOUS) ×2 IMPLANT
CATH CPB KIT VANTRIGT (MISCELLANEOUS) ×4 IMPLANT
CATH HEART VENT LEFT (CATHETERS) ×3 IMPLANT
CATH RETROPLEGIA CORONARY 14FR (CATHETERS) IMPLANT
CATH ROBINSON RED A/P 18FR (CATHETERS) ×13 IMPLANT
CATH THORACIC 28FR RT ANG (CATHETERS) ×4 IMPLANT
CATH/SQUID NICHOLS JEHLE COR (CATHETERS) ×1 IMPLANT
CLIP FOGARTY SPRING 6M (CLIP) IMPLANT
CNTNR URN SCR LID CUP LEK RST (MISCELLANEOUS) IMPLANT
CONT SPEC 4OZ STRL OR WHT (MISCELLANEOUS) ×9
CONTAINER PROTECT SURGISLUSH (MISCELLANEOUS) ×9 IMPLANT
COVER SURGICAL LIGHT HANDLE (MISCELLANEOUS) ×8 IMPLANT
DRAIN CHANNEL 32F RND 10.7 FF (WOUND CARE) ×4 IMPLANT
DRAPE CARDIOVASCULAR INCISE (DRAPES) ×3
DRAPE SPACE STATION 30X60X34 (DRAPES) ×1 IMPLANT
DRAPE SRG 135X102X78XABS (DRAPES) ×3 IMPLANT
DRAPE WARM FLUID 44X44 (DRAPES) ×1 IMPLANT
DRSG AQUACEL AG ADV 3.5X14 (GAUZE/BANDAGES/DRESSINGS) ×4 IMPLANT
ELECT BLADE 4.0 EZ CLEAN MEGAD (MISCELLANEOUS) ×3
ELECT BLADE 6.5 EXT (BLADE) ×4 IMPLANT
ELECT CAUTERY BLADE 6.4 (BLADE) ×4 IMPLANT
ELECT REM PT RETURN 9FT ADLT (ELECTROSURGICAL) ×6
ELECTRODE BLDE 4.0 EZ CLN MEGD (MISCELLANEOUS) ×3 IMPLANT
ELECTRODE REM PT RTRN 9FT ADLT (ELECTROSURGICAL) ×6 IMPLANT
FELT TEFLON 1X6 (MISCELLANEOUS) ×8 IMPLANT
GAUZE 4X4 16PLY ~~LOC~~+RFID DBL (SPONGE) ×4 IMPLANT
GAUZE SPONGE 4X4 12PLY STRL (GAUZE/BANDAGES/DRESSINGS) ×8 IMPLANT
GAUZE SPONGE 4X4 12PLY STRL LF (GAUZE/BANDAGES/DRESSINGS) ×1 IMPLANT
GLOVE BIO SURGEON STRL SZ7.5 (GLOVE) ×8 IMPLANT
GOWN STRL REUS W/ TWL LRG LVL3 (GOWN DISPOSABLE) ×12 IMPLANT
GOWN STRL REUS W/TWL LRG LVL3 (GOWN DISPOSABLE) ×12
HEMOSTAT POWDER SURGIFOAM 1G (HEMOSTASIS) ×14 IMPLANT
HEMOSTAT SURGICEL 2X14 (HEMOSTASIS) ×5 IMPLANT
INSERT FOGARTY XLG (MISCELLANEOUS) ×1 IMPLANT
KIT BASIN OR (CUSTOM PROCEDURE TRAY) ×4 IMPLANT
KIT SUCTION CATH 14FR (SUCTIONS) ×7 IMPLANT
KIT TURNOVER KIT B (KITS) ×4 IMPLANT
LEAD PACING MYOCARDI (MISCELLANEOUS) ×4 IMPLANT
LINE VENT (MISCELLANEOUS) ×1 IMPLANT
NS IRRIG 1000ML POUR BTL (IV SOLUTION) ×24 IMPLANT
PACK OPEN HEART (CUSTOM PROCEDURE TRAY) ×4 IMPLANT
PAD ARMBOARD 7.5X6 YLW CONV (MISCELLANEOUS) ×8 IMPLANT
POSITIONER HEAD DONUT 9IN (MISCELLANEOUS) ×4 IMPLANT
POWDER SURGICEL 3.0 GRAM (HEMOSTASIS) ×1 IMPLANT
SET MPS 3-ND DEL (MISCELLANEOUS) ×1 IMPLANT
SPONGE T-LAP 18X18 ~~LOC~~+RFID (SPONGE) ×18 IMPLANT
SPONGE T-LAP 4X18 ~~LOC~~+RFID (SPONGE) ×4 IMPLANT
SURGIFLO W/THROMBIN 8M KIT (HEMOSTASIS) ×5 IMPLANT
SUT BONE WAX W31G (SUTURE) ×4 IMPLANT
SUT EB EXC GRN/WHT 2-0 V-5 (SUTURE) ×9 IMPLANT
SUT ETHIBON EXCEL 2-0 V-5 (SUTURE) ×3 IMPLANT
SUT ETHIBOND 2 0 SH (SUTURE) ×15
SUT ETHIBOND 2 0 SH 36X2 (SUTURE) ×3 IMPLANT
SUT ETHIBOND V-5 VALVE (SUTURE) ×2 IMPLANT
SUT PROLENE 3 0 RB 1 (SUTURE) ×4 IMPLANT
SUT PROLENE 3 0 SH 1 (SUTURE) IMPLANT
SUT PROLENE 3 0 SH DA (SUTURE) ×8 IMPLANT
SUT PROLENE 4 0 RB 1 (SUTURE) ×51
SUT PROLENE 4 0 SH DA (SUTURE) ×9 IMPLANT
SUT PROLENE 4-0 RB1 .5 CRCL 36 (SUTURE) ×9 IMPLANT
SUT PROLENE 6 0 C 1 30 (SUTURE) ×3 IMPLANT
SUT PROLENE 6 0 CC (SUTURE) ×1 IMPLANT
SUT SILK 1 TIES 10X30 (SUTURE) ×1 IMPLANT
SUT SILK 2 0 SH CR/8 (SUTURE) ×1 IMPLANT
SUT STEEL 6MS V (SUTURE) ×7 IMPLANT
SUT STEEL SZ 6 DBL 3X14 BALL (SUTURE) ×5 IMPLANT
SUT VIC AB 1 CTX 36 (SUTURE) ×6
SUT VIC AB 1 CTX36XBRD ANBCTR (SUTURE) ×6 IMPLANT
SUT VIC AB 2-0 CTX 27 (SUTURE) IMPLANT
SUT VIC AB 3-0 X1 27 (SUTURE) IMPLANT
SYR 10ML KIT SKIN ADHESIVE (MISCELLANEOUS) IMPLANT
SYSTEM SAHARA CHEST DRAIN ATS (WOUND CARE) ×4 IMPLANT
TAPE PAPER 2X10 WHT MICROPORE (GAUZE/BANDAGES/DRESSINGS) ×1 IMPLANT
TOWEL GREEN STERILE (TOWEL DISPOSABLE) ×4 IMPLANT
TOWEL GREEN STERILE FF (TOWEL DISPOSABLE) ×4 IMPLANT
TRAY FOLEY SLVR 16FR TEMP STAT (SET/KITS/TRAYS/PACK) ×4 IMPLANT
UNDERPAD 30X36 HEAVY ABSORB (UNDERPADS AND DIAPERS) ×4 IMPLANT
VALVE AORTIC SZ25 INSP/RESIL (Prosthesis & Implant Heart) ×1 IMPLANT
VENT LEFT HEART 12002 (CATHETERS) ×3
WATER STERILE IRR 1000ML POUR (IV SOLUTION) ×8 IMPLANT

## 2021-11-24 NOTE — Anesthesia Procedure Notes (Signed)
Central Venous Catheter Insertion Performed by: Albertha Ghee, MD, anesthesiologist Start/End6/22/2023 7:13 AM, 11/24/2021 7:14 AM Patient location: Pre-op. Preanesthetic checklist: patient identified, IV checked, site marked, risks and benefits discussed, surgical consent, monitors and equipment checked, pre-op evaluation, timeout performed and anesthesia consent Position: Trendelenburg Hand hygiene performed  and maximum sterile barriers used  PA cath was placed.Swan type:thermodilution Procedure performed without using ultrasound guided technique. Attempts: 1 Patient tolerated the procedure well with no immediate complications.

## 2021-11-24 NOTE — Progress Notes (Signed)
  Echocardiogram Echocardiogram Transesophageal has been performed.  Joanna Alvarado 11/24/2021, 8:29 AM

## 2021-11-24 NOTE — Progress Notes (Signed)
Pharmacy Antibiotic Note  Joanna Alvarado is a 66 y.o. female s/p CABG.  Pharmacy has been consulted for vancomycin dosing for 48 hours prophylaxis -'1500mg'$  IV vancomycin given ~ 8am this morning -SCr= 1.1  Plan: -'1500mg'$  IV q24hr x1 at 8am -No further dosing needed after this  Height: '5\' 8"'$  (172.7 cm) Weight: (!) 143.8 kg (317 lb) IBW/kg (Calculated) : 63.9  Temp (24hrs), Avg:98.5 F (36.9 C), Min:98.5 F (36.9 C), Max:98.5 F (36.9 C)  Recent Labs  Lab 11/22/21 1312 11/24/21 0544 11/24/21 0805 11/24/21 0936 11/24/21 1020 11/24/21 1119 11/24/21 1216  WBC 3.3* 9.4  --   --   --   --   --   CREATININE 1.11*  --  1.10* 1.20* 1.00 1.10* 1.10*    Estimated Creatinine Clearance: 75.1 mL/min (A) (by C-G formula based on SCr of 1.1 mg/dL (H)).    Allergies  Allergen Reactions   Metamucil [Psyllium] Other (See Comments)    Severe constipation   Trulicity [Dulaglutide]     KIDNEY INFECTION,INABILITY TO HAVE BOWEL MOVEMENTS,BLOOD IN URINE   Adhesive [Tape]     Latex, band aids  causes whelts and blisters   Hydrocodone-Acetaminophen Itching    Tolerates small/ infrequent doses   Latex Other (See Comments)    Band-aids causes whelts and blisters   Oxycontin [Oxycodone Hcl]     "FELT LIKE HEAD WAS GOING TO EXPLODE"   Ciprofloxacin Rash    Yeast infection and a severe rash    Codeine Itching and Rash   Other Palpitations    Steroids      "makes me feel like I'm having a heart attack. -      Thank you for allowing pharmacy to be a part of this patient's care.  Hildred Laser, PharmD Clinical Pharmacist **Pharmacist phone directory can now be found on [email or web address] ([email or web address]).  Listed under [place].

## 2021-11-24 NOTE — H&P (Signed)
HPI 66 year old 300 pound nondiabetic returns for further discussion of her symptomatic moderate to severe aortic insufficiency.  She is a patient of Dr. Irven Shelling.  She had repeat left and right heart cath and follow-up of her study in 2021.  This shows mild pulmonary hypertension, normal cardiac output, and clean coronaries with the exception of a small first diagonal branch of the LAD with a 75% stenosis but the vessel is too small for her to benefit from CABG.  Patient was evaluated by her dentist and cleared for valve replacement surgery.  The patient had pulmonary function test which were satisfactory with good mechanics of breathing.  The patient's von Willebrand's panel was also within normal limits.  She has history of thrombocytopenia and probable ITP followed by hematology.  Last platelet count was 80,000.  She has had previous surgical procedures without bleeding issues.  The patient is now ready to proceed with aortic valve replacement which will be scheduled next week.  We both agree that a bioprosthetic valve would be her best choice for AVR.  Current Facility-Administered Medications  Medication Dose Route Frequency Provider Last Rate Last Admin   0.9 % irrigation (POUR BTL)    PRN Dahlia Byes, MD   5,000 mL at 11/24/21 0711   ceFAZolin (ANCEF) IVPB 2g/100 mL premix  2 g Intravenous To OR Dahlia Byes, MD       ceFAZolin (ANCEF) IVPB 3g/100 mL premix  3 g Intravenous To OR Dahlia Byes, MD       chlorhexidine (HIBICLENS) 4 % liquid 2 Application  30 mL Topical UD Dahlia Byes, MD       [facility] Card[facility] Surgery, Patient & Family Education   Does not apply Once Dahlia Byes, MD       dexmedetomidine (PRECEDEX) 400 MCG/100ML (4 mcg/mL) infusion  0.1-0.7 mcg/kg/hr Intravenous To OR Dahlia Byes, MD       EPINEPHrine (ADRENALIN) 5 mg in NS 250 mL (0.02 mg/mL) premix infusion  0-10 mcg/min Intravenous To OR Dahlia Byes, MD       hemostatic agents (no charge)  Optime    PRN Dahlia Byes, MD   1 Application at 27/06/23 [phone]   hemostatic agents (no charge) Optime    PRN Dahlia Byes, MD   1 Application at 31/51/76 0717   heparin 30,000 units/NS 1000 mL solution for CELLSAVER   Other To OR Dahlia Byes, MD       heparin sodium (porcine) 2,500 Units, papaverine 30 mg in electrolyte-A (PLASMALYTE-A PH 7.4) 500 mL irrigation   Irrigation To OR Dahlia Byes, MD       heparin sodium (porcine) 2,500 Units, papaverine 30 mg in electrolyte-A (PLASMALYTE-A PH 7.4) 500 mL irrigation    PRN Dahlia Byes, MD   500 mL at 11/24/21 1607   insulin regular, human (MYXREDLIN) 100 units/ 100 mL infusion   Intravenous To OR Dahlia Byes, MD       magnesium sulfate (IV Push/IM) injection 40 mEq  40 mEq Other To OR Dahlia Byes, MD       metoprolol tartrate (LOPRESSOR) tablet 12.5 mg  12.5 mg Oral Once Dahlia Byes, MD       milrinone (PRIMACOR) 20 MG/100 ML (0.2 mg/mL) infusion  0.3 mcg/kg/min Intravenous To OR Dahlia Byes, MD       nitroGLYCERIN 50 mg in dextrose 5 % 250 mL (0.2 mg/mL) infusion  2-200 mcg/min Intravenous To OR Dahlia Byes, MD       norepinephrine (LEVOPHED) 5m in 2552m(  0.016 mg/mL) premix infusion  0-40 mcg/min Intravenous To OR Dahlia Byes, MD       phenylephrine (NEO-SYNEPHRINE) 46m/NS 2523mpremix infusion  30-200 mcg/min Intravenous To OR VaDahlia ByesMD       potassium chloride injection 80 mEq  80 mEq Other To OR VaDahlia ByesMD       Surgiflo with Thrombin (Hemostatic Matrix Kit) Optime    PRN VaDahlia ByesMD   1 Application at [id number] Surgifoam 1 Gm with 0.9% sodium chloride (4 ml) topical solution    PRN VaDahlia ByesMD   4 mL at 11/24/21 0715   tranexamic acid (CYKLOKAPRON) 2,500 mg in sodium chloride 0.9 % 250 mL (10 mg/mL) infusion  1.5 mg/kg/hr Intravenous To OR VaDahlia ByesMD       tranexamic acid (CYKLOKAPRON) bolus via infusion - over 30 minutes 2,178 mg  15 mg/kg Intravenous To  OR VaDahlia ByesMD       tranexamic acid (CYKLOKAPRON) pump prime solution 290 mg  2 mg/kg Intracatheter To OR VaDahlia ByesMD       vancomycin (VANCOREADY) IVPB 1500 mg/300 mL  1,500 mg Intravenous To OR VaDahlia ByesMD       The patient was seen by her hematologist in May of this year who felt that her platelet count and function was adequate for aortic valve replacement surgery.  I have called Dr. DeLavera Guiseo confirm and left a message.  Review of Systems:                   Review of Systems :  [ y ] = yes, [  ] = no        General :  Weight gain [   ]    Weight loss  [   ]  Fatigue [  x]  Fever [  ]  Chills  [weight down 3 pounds on a diet]                                          HEENT    Headache [  ]  Dizziness [  ]  Blurred vision [  ] Glaucoma  [  ]                          Nosebleeds [  ] Painful or loose teeth [  ]        Cardiac :  Chest pain/ pressure [  ]  Resting SOB [  ] exertional SOB [ x ]x                        Orthopnea [  ]  Pedal edema  [  ]  Palpitations [ x ] Syncope/presyncope '[ ]'                         Paroxysmal nocturnal dyspnea [  ]         Pulmonary : cough [  ]  wheezing [  ]  Hemoptysis [  ] Sputum [  ] Snoring [  ]                              Pneumothorax [  ]  Sleep apnea [  ]        GI : Vomiting [  ]  Dysphagia [  ]  Melena  [  ]  Abdominal pain [  ] BRBPR [  ]              Heart burn [  ]  Constipation [  ] Diarrhea  [  ] Colonoscopy [   ]        GU : Hematuria [  ]  Dysuria [  ]  Nocturia [  ] UTI's [  ]        Vascular : Claudication [  ]  Rest pain [  ]  DVT [  ] Vein stripping [  ] leg ulcers [  ]                          TIA [  ] Stroke [  ]  Varicose veins [  ]        NEURO :  Headaches  [  ] Seizures [  ] Vision changes [  ] Paresthesias [  ]                                               Musculoskeletal :  Arthritis [  x] Gout  [  ]  Back pain [  ]  Joint pain [ x ] right hip and right knee        Skin :  Rash [  ]   Melanoma [  ] Sores [  ]        Heme : Bleeding problems [  ]Clotting Disorders [  ] Anemia [  ]Blood Transfusion '[ ]'         Endocrine : Diabetes [  ] Heat or Cold intolerance [  ] Polyuria [  ]excessive thirst '[ ]'         Psych : Depression [  ]  Anxiety [  ]  Psych hospitalizations [  ] Memory change [  ]                                                                            Physical Exam     Physical Exam  General: Obese middle-aged white female no acute distress HEENT: Normocephalic pupils equal , dentition adequate Neck: Supple without JVD, adenopathy, or bruit Chest: Clear to auscultation, symmetrical breath sounds, no rhonchi, no tenderness             or deformity Cardiovascular: Regular rate and rhythm, soft diastolic 2/6 murmur left sternal border.  No gallop, peripheral pulses             palpable in all extremities Abdomen:  Soft, nontender, no palpable mass or organomegaly Extremities: Warm, well-perfused, no clubbing cyanosis edema or tenderness,              no venous stasis changes of the legs Rectal/GU: Deferred Neuro: Grossly non--focal and symmetrical throughout Skin: Clean and dry without rash or ulceration  Diagnostic Tests: PFT testing shows normal FEV1 FVC and DLCO Von Willebrand's panel for platelet function is within normal limits  Impression: Symptomatic AI felt to be moderate to severe on echo by Dr. Einar Gip.  Trileaflet aortic valve.  No significant coronary disease other than a small first diagonal branch of the LAD.  Mild aortic dilatation with ascending aorta measured at 4.2 cm, does not meet criteria for replacement.  Plan: Plan aortic valve replacement with a bioprosthetic valve on June 22 at [facility].  Benefits and risks of surgery have been discussed in detail with the patient and her husband.  I will call her hematologist office again to discuss any preparation that might help her platelet dysfunction/thrombocytopenia including a  course of oral Decadron or IVIG.    Dahlia Byes, MD [facility] [phone]

## 2021-11-24 NOTE — Brief Op Note (Signed)
11/24/2021  11:40 AM  PATIENT:  Joanna Alvarado  66 y.o. female  PRE-OPERATIVE DIAGNOSIS:  SEVERE AI  POST-OPERATIVE DIAGNOSIS:  SEVERE AI  PROCEDURE: TRANSESOPHAGEAL ECHOCARDIOGRAM (TEE), AORTIC VALVE REPLACEMENT (AVR) USING (an INSPIRIS Model # 11500A, SERIAL # [id number], Size 25MM)  SURGEON:  Surgeon(s) and Role:    Dahlia Byes, MD - Primary  PHYSICIAN ASSISTANT: Lars Pinks PA-C  ASSISTANTS: Despina Arias RNFA   ANESTHESIA:   general  EBL:  Per anesthesia record  BLOOD ADMINISTERED: Two FFP and two PLTS  DRAINS:  Chest tubes placed in the mediastinal and pleural spaces    SPECIMEN:  Source of Specimen:  Native aortic valve leaflets  DISPOSITION OF SPECIMEN:  PATHOLOGY  COUNTS CORRECT:  YES  DICTATION: .Dragon Dictation  PLAN OF CARE: Admit to inpatient   PATIENT DISPOSITION:  ICU - intubated and hemodynamically stable.   Delay start of Pharmacological VTE agent (>24hrs) due to surgical blood loss or risk of bleeding: no  BASELINE WEIGHT: 143.8 kg

## 2021-11-24 NOTE — Anesthesia Procedure Notes (Signed)
Central Venous Catheter Insertion Performed by: Albertha Ghee, MD, anesthesiologist Start/End6/22/2023 7:00 AM, 11/24/2021 7:13 AM Patient location: Pre-op. Preanesthetic checklist: patient identified, IV checked, site marked, risks and benefits discussed, surgical consent, monitors and equipment checked, pre-op evaluation, timeout performed and anesthesia consent Position: Trendelenburg Lidocaine 1% used for infiltration and patient sedated Hand hygiene performed , maximum sterile barriers used  and Seldinger technique used Catheter size: 8.5 Fr Central line was placed.Sheath introducer Procedure performed using ultrasound guided technique. Ultrasound Notes:anatomy identified, needle tip was noted to be adjacent to the nerve/plexus identified, no ultrasound evidence of intravascular and/or intraneural injection and image(s) printed for medical record Attempts: 1 Following insertion, line sutured, dressing applied and Biopatch. Post procedure assessment: blood return through all ports, free fluid flow and no air  Patient tolerated the procedure well with no immediate complications.

## 2021-11-24 NOTE — Op Note (Unsigned)
NAME: Joanna Alvarado, Joanna Alvarado MEDICAL RECORD NO: [id number] ACCOUNT NO: [id number] DATE OF BIRTH: [birth year] FACILITY: [facility] LOCATION: [facility] PHYSICIAN: Ivin Poot III, MD  Operative Report   DATE OF PROCEDURE: 11/24/2021  OPERATION:  Aortic valve replacement with a 25 mm Inspiris Edwards pericardial valve, serial [id number].  PREOPERATIVE DIAGNOSIS:  Severe aortic insufficiency with morbid obesity and idiopathic thrombocytopenia.  POSTOPERATIVE DIAGNOSIS:  Severe aortic insufficiency with morbid obesity and idiopathic thrombocytopenia.  SURGEON:  Len Childs, MD  ASSISTANT:  Lars Pinks, PA-C.  Surgical first assistant was needed to assist because the complexity of the procedure and the acuity of illness of the patient.  The first assistant was needed to assist in suture management during the valve replacement as well as suctioning, exposure,  and general assistance with the aortotomy closure.  CLINICAL NOTE:  The patient is a 66 year old female who has had increasing symptoms of severe AI with fatigue, decreased exercise tolerance, shortness of breath and chest discomfort.  She was referred for high risk aortic valve replacement due to her  morbid obesity and ITP.  The patient's condition was optimized and a platelet count of 78,000 was improved to 89,000 with a short course of oral Decadron.  I had seen the patient in the office several times and had discussed the details of the procedure,  the choice of a bioprosthetic valve, the details of the surgery including the use of general anesthesia in cardiopulmonary bypass and the location of the chest incision.  She understood the risks of bleeding, especially as well as infection, stroke,  organ failure, death.  DESCRIPTION OF PROCEDURE:  The patient was met in preoperative holding where informed consent was documented and final issues with the surgery were discussed with the patient directly.  The patient was then taken to the  operating room and placed supine  on the operating table.  General anesthesia was induced under invasive hemodynamic monitoring and the patient remained stable.  A transesophageal echo probe was placed, which confirmed the diagnosis of severe aortic insufficiency.  The patient was  prepped and draped as a sterile field.  A proper timeout was performed.  A sternal incision was made.  There was considerable blood loss approximately 1 unit of blood at least with the soft tissue incision and the sternal incision.  The sternal retractor was placed and the pericardium was opened and suspended.  The heart had good systolic function.  The ascending aorta was minimally dilated and did not meet criteria for surgery.  Pursestrings were placed in the ascending aorta and  right atrium and heparin was administered.  The patient was then cannulated and placed on bypass.  An LV vent was placed via the right superior pulmonary vein and a retrograde coronary catheter was placed in the right atrium.  The patient was cooled to 32 degrees and the aortic crossclamp was applied.  One liter of cold blood KBC cardioplegia was delivered using the retrograde coronary sinus catheter because of the patient's severe AI.  This resulted in good cardioplegic  arrest and supple temperature dropped less than 14 degrees.  An aortotomy was performed.  The aortic valve was inspected.  It was very floppy and dysplastic and thickened.  It was excised.  There was no evidence of endocarditis.  The annulus was sized to a 25 mm Inspiris valve.  The outflow tract was irrigated.   Subannular 2-0 Ethibond sutures were placed around the annulus measuring 16 total.  The sutures were placed through  the sewing ring and the valve was seated and the sutures were tied.  There was good confirmation of the valve to the annulus and there was  no obstruction of the coronary ostia.  The patient's aorta was very thin and for that reason, a 2-layer closure of  the aortotomy was performed first using interrupted 4-0 pledgeted Prolenes followed by a running 4-0 Prolene over the interrupted sutures.  A dose of antegrade KBC cardioplegia  for reanimation was delivered and this resulted with a heart resuming a normal rhythm.  The crossclamp was then removed.  The aortotomy was hemostatic.  The cardioplegia cannulas were removed.  The LV vent was removed.  The patient was rewarmed and reperfused.  Temporary pacing wires were applied.  The lungs were expanded and ventilator was resumed.  When the patient was  adequately reperfused, she was weaned from cardiopulmonary bypass without difficulty.  Systolic function was excellent.  Echo showed normal function of the aortic valve without aortic insufficiency and a transvalvular gradient of approximately 12 mmHg.  Protamine was administered.  There was a little improvement in the diffuse coagulopathy.  The platelet count came back at 78,000.  The patient was given platelets.  The ACT was still 140-150.  The patient was given platelets.  The fibrinogen level was  120.  The patient was given cryoprecipitate.  Finally, after blood product transfusion the blood started to thicken.  Anterior mediastinal and posterior mediastinal chest tube were placed and brought out through separate incisions.  The sternum was closed with interrupted steel wire.  The pectoralis  fascia was closed with a running #1 Vicryl.  The subcutaneous and skin layers were closed with running Vicryl and sterile dressings were applied.  Total cardiopulmonary bypass time was 123 minutes.   PUS D: 11/24/2021 4:56:38 pm T: 11/24/2021 7:07:00 pm  JOB: [id number]/ [id number]

## 2021-11-24 NOTE — Discharge Instructions (Signed)

## 2021-11-24 NOTE — Procedures (Signed)
Extubation Procedure Note  Patient Details:   Name: Joanna Alvarado DOB: 1955/12/23 MRN: [id number]   Airway Documentation:    Vent end date: 11/24/21 Vent end time: 1854   Evaluation  O2 sats: stable throughout Complications: No apparent complications Patient did tolerate procedure well. Bilateral Breath Sounds: Diminished, Clear   Yes  Patient extubated per protocol and order to 4L [place] with no apparent complications. Positive cuff leak was noted prior to extubation. Patient achieved NIF of -20 and VC of 0.9L with good patient effort. Patient is alert, oriented, with a good cough and ability to speak.   Quentin Ore 11/24/2021, 6:59 PM

## 2021-11-24 NOTE — Progress Notes (Signed)
      [address]       [place] [address]             [phone]      S/p AVR  Intubated, sedated  BP 120/79   Pulse 89   Temp 98.2 F (36.8 C)   Resp 17   Ht '5\' 8"'$  (1.727 m)   Wt (!) 143.8 kg   SpO2 95%   BMI 48.20 kg/m  CI = 3.4   Intake/Output Summary (Last 24 hours) at 11/24/2021 1735 Last data filed at 11/24/2021 1700 Gross per 24 hour  Intake 3776.29 ml  Output 1992 ml  Net 1784.29 ml    High CT output initially with coagulopathy, but down after blood products  Remo Lipps C. Roxan Hockey, MD [facility] [phone]

## 2021-11-24 NOTE — Anesthesia Procedure Notes (Signed)
Procedure Name: Intubation Date/Time: 11/24/2021 7:56 AM  Performed by: Anastasio Auerbach, CRNAPre-anesthesia Checklist: Patient identified, Emergency Drugs available, Suction available and Patient being monitored Patient Re-evaluated:Patient Re-evaluated prior to induction Oxygen Delivery Method: Circle system utilized Preoxygenation: Pre-oxygenation with 100% oxygen Induction Type: IV induction Ventilation: Mask ventilation without difficulty Laryngoscope Size: Glidescope and 3 Grade View: Grade I Tube type: Oral Tube size: 7.5 mm Number of attempts: 2 Airway Equipment and Method: Stylet and Video-laryngoscopy Placement Confirmation: ETT inserted through vocal cords under direct vision, positive ETCO2 and breath sounds checked- equal and bilateral Secured at: 21 cm Tube secured with: Tape Dental Injury: Teeth and Oropharynx as per pre-operative assessment  Difficulty Due To: Difficulty was unanticipated Comments: NO view with MAC #3.  Easily ventilated between attempts. Easy intubation with glidescope.

## 2021-11-24 NOTE — Discharge Summary (Addendum)
Physician Discharge Summary       [address]       [place] [address]             [phone]    Patient ID: Joanna Alvarado MRN: [id number] DOB/AGE: [birth year] 67 y.o.  Admit date: 11/24/2021 Discharge date: 11/30/2021  Admission Diagnoses: Moderate to severe aortic insufficiency Discharge Diagnoses:  S/p AVR  Thrombocytopenia-history of ITP History of the following:  Abnormal myocardial perfusion study 09/26/2019   Anemia     Angina pectoris ([place]) - Class II-III 09/26/2019   Arthritis      KNEES   Benign neoplasm of ascending colon     Benign neoplasm of cecum     Benign neoplasm of descending colon     Benign neoplasm of sigmoid colon     Benign neoplasm of transverse colon     Bowel habit changes     Cataract      BILATERAL-REMOVED   Chronic postoperative pain 12/02/2012   Diabetes mellitus without complication (HCC)     Disturbance of skin sensation 12/02/2012   Fibromyalgia     GERD (gastroesophageal reflux disease)     History of colonic polyps     Hypertension     Hypothyroidism     Myalgia and myositis, unspecified 12/02/2012   Personal history of colonic polyps     Primary hypothyroidism 11/30/2015   Senile nuclear sclerosis 04/24/2014   Sleep apnea      uses c-pap   Thyroid disease     Thyroid nodule 11/30/2015   Type 2 diabetes mellitus without complications ([place]) 18/29/9371   Vitamin D deficiency      Consults: None  Procedure (s):  Aortic valve replacement with a 25 mm Inspiris Edwards pericardial valve, serial #[id number] by Dr. Prescott Gum on 11/24/2021.   HPI: This is a 66 year old 300 pound nondiabetic returns for further discussion of her symptomatic moderate to severe aortic insufficiency.  She is a patient of Dr. Irven Shelling.  She had repeat left and right heart cath and follow-up of her study in 2021.  This shows mild pulmonary hypertension, normal cardiac output, and clean coronaries with the exception of a small first diagonal  branch of the LAD with a 75% stenosis but the vessel is too small for her to benefit from CABG.   Patient was evaluated by her dentist and cleared for valve replacement surgery.   The patient had pulmonary function test which were satisfactory with good mechanics of breathing.   The patient's von Willebrand's panel was also within normal limits.  She has history of thrombocytopenia and probable ITP followed by hematology.  Last platelet count was 80,000.  She has had previous surgical procedures without bleeding issues.   The patient is now ready to proceed with aortic valve replacement.  We both agree that a bioprosthetic valve would be her best choice for AVR. Potential risks, benefits, and complications of the surgery were discussed with the patient and she agreed to proceed with surgery.   Hospital Course: Patient underwent an aortic valve replacement (bioprosthetic, size 25 mm) on 11/24/2021. She was extubated the evening of surgery. Gordy Councilman, chest tubes, a line, and foley were all removed early in her post operative course. She does have a history of ITP so no aspirin was given post op. Platelets were 78,000 post op day one. She was given additional platelets. She had expected post op blood loss anemia. She was transitioned off the Insulin drip. Her  pre op HGA1C was 4.9. She has a history of hypothyroidism. She was restarted on Levothyroxine as taken prior to surgery. She was diuresed early postoperatively for expected volume excess but had a slow but steady rise in her creatinine from a baseline of 1.1 preoperatively to 1.5.  Diuretic was discontinued.  Relatively low blood pressure was supported with oral midodrine. She was mobilized with the assistance of physical therapy.  She was stable for transfer from the ICU to 4E for further convalescence on 06/25. She is ambulating on room air with good oxygenation. Her wound is clean, dry, healing without signs of infection. She has been tolerating a  diet and has had a bowel movement. Her last platelet count was up to 77,000. Epicardial pacing wires were removed on 06/27. Her HR was up into the 120's the am of 06/27. Lopressor was increased to 25 mg bid and she was given IV Lopressor. She was sinus rhythm yesterday and this am. Her BP has improved so Midodrine was stopped. Home PT was arranged. She is felt surgically stable for discharge today.    Latest Vital Signs: Blood pressure 109/68, pulse 65, temperature 98.5 F (36.9 C), temperature source Oral, resp. rate 20, height '5\' 8"'$  (1.727 m), weight (!) 150 kg, SpO2 96 %.  Physical Exam: Cardiovascular: RRR, flow murmur Pulmonary: Clear to auscultation on the right and slightly diminished on the left Abdomen: Soft, non tender, bowel sounds present. Extremities: Mild bilateral lower extremity edema. Wounds: Clean and dry.  No erythema or signs of infection.  Discharge Condition:Stable and discharged to home.  Recent laboratory studies:  Lab Results  Component Value Date   WBC 6.9 11/29/2021   HGB 10.1 (L) 11/29/2021   HCT 29.5 (L) 11/29/2021   MCV 94.6 11/29/2021   PLT 77 (L) 11/29/2021   Lab Results  Component Value Date   NA 140 11/29/2021   K 4.0 11/29/2021   CL 109 11/29/2021   CO2 23 11/29/2021   CREATININE 1.01 (H) 11/29/2021   GLUCOSE 108 (H) 11/29/2021      Diagnostic Studies: DG Chest 2 View  Result Date: 11/30/2021 CLINICAL DATA:  Left pleural effusion and chest pain. EXAM: CHEST - 2 VIEW COMPARISON:  AP Lat 11/28/2021. FINDINGS: AP Lat views, 5:01 a.m. 11/30/2021. There is persistent moderate layering left pleural effusion with overlying atelectasis or consolidation in the left lower lobe. There is mild bilateral perihilar linear atelectasis with the remainder of the lung fields remaining clear. Stable mild cardiomegaly. There are sternotomy sutures and an aortic valvular prosthesis. There is aortic atherosclerosis with stable mediastinum. Osteopenia and  degenerative change thoracic spine. No acute osseous findings. IMPRESSION: Stable overall aeration with moderate left pleural effusion and overlying atelectasis or consolidation in the left lower lobe. No new or further acute chest findings. Stable cardiomegaly with normal caliber central vasculature. Electronically Signed   By: Telford Nab M.D.   On: 11/30/2021 07:14   ECHO INTRAOPERATIVE TEE  Result Date: 11/28/2021  *INTRAOPERATIVE TRANSESOPHAGEAL REPORT *  Patient Name:   Joanna Alvarado Date of Exam: 11/24/2021 Medical Rec #:  [id number]       Height:       68.0 in Accession #:    [id number]      Weight:       317.0 lb Date of Birth:  [birth year]       BSA:          2.49 m Patient Age:    48 years  BP:           110/70 mmHg Patient Gender: F               HR:           76 bpm. Exam Location:  Inpatient Transesophogeal exam was perform intraoperatively during surgical procedure. Patient was closely monitored under general anesthesia during the entirety of examination. Indications:     aortic valve replacement Performing Phys: [place] Joscelyn Hardrick Diagnosing Phys: Albertha Ghee MD Complications: No known complications during this procedure. POST-OP IMPRESSIONS _ Left Ventricle: The left ventricle is unchanged from pre-bypass. _ Right Ventricle: The right ventricle appears unchanged from pre-bypass. _ Aorta: The aorta appears unchanged from pre-bypass. _ Left Atrial Appendage: The left atrial appendage appears unchanged from pre-bypass. _ Aortic Valve: A bioprosthetic bioprosthetic valve was placed, leaflets are freely mobile Manufactured by; inspiris resilia Size; 67m. There is no regurgitation.Mean PG 13 mmHg across the new valve. _ Mitral Valve: The mitral valve appears unchanged from pre-bypass. _ Tricuspid Valve: The tricuspid valve appears unchanged from pre-bypass. PRE-OP FINDINGS  Left Ventricle: The left ventricle has normal systolic function, with an ejection fraction of 60-65%. The cavity size  was normal. There is no increase in left ventricular wall thickness. There is no left ventricular hypertrophy. Right Ventricle: The right ventricle has normal systolic function. The cavity was normal. There is no increase in right ventricular wall thickness. Left Atrium: Left atrial size was normal in size. No left atrial/left atrial appendage thrombus was detected. Right Atrium: Right atrial size was normal in size. Interatrial Septum: No atrial level shunt detected by color flow Doppler. Pericardium: There is no evidence of pericardial effusion. Mitral Valve: The mitral valve is normal in structure. Mitral valve regurgitation is trivial by color flow Doppler. There is No evidence of mitral stenosis. Tricuspid Valve: The tricuspid valve was normal in structure. Tricuspid valve regurgitation was not visualized by color flow Doppler. Aortic Valve: The aortic valve is tricuspid Aortic valve regurgitation is severe by color flow Doppler. The jet is posteriorly-directed. Pulmonic Valve: The pulmonic valve was normal in structure. Pulmonic valve regurgitation is not visualized by color flow Doppler. Aorta: The aortic arch are normal in size and structure. There is moderate dilatation of the aortic root and of the ascending aorta, measuring 41 mm. +--------------+--------++ LEFT VENTRICLE         +--------------+--------++ PLAX 2D                +--------------+--------++ LVOT diam:    2.10 cm  +--------------+--------++ LVOT Area:    3.46 cm +--------------+--------++                        +--------------+--------++ +------------+--------++ AORTIC VALVE         +------------+--------++ AR PHT:     270 msec +------------+--------++  +--------------+-------+ SHUNTS                +--------------+-------+ Systemic Diam:2.10 cm +--------------+-------+  AAlbertha GheeMD Electronically signed by AAlbertha GheeMD Signature Date/Time: 11/28/2021/6:28:29 PM    Final    DG Chest 2  View  Result Date: 11/28/2021 CLINICAL DATA:  Pleural effusion EXAM: CHEST - 2 VIEW COMPARISON:  11/27/2021 FINDINGS: Cardiomediastinal silhouette and pulmonary vasculature are within normal limits. Median sternotomy changes again seen. Unchanged left basilar opacity likely a combination of small pleural effusion with adjacent atelectasis. Right lung remains well aerated. IMPRESSION: Unchanged left basilar opacity likely combination of small  pleural effusion with adjacent atelectasis. Electronically Signed   By: Miachel Roux M.D.   On: 11/28/2021 08:21   DG Chest Port 1 View  Result Date: 11/27/2021 CLINICAL DATA:  Pneumothorax EXAM: PORTABLE CHEST 1 VIEW COMPARISON:  November 26, 2021 FINDINGS: The right IJ sheath is stable. The effusion with underlying opacity in the left base is more prominent. The heart, hila, mediastinum, lungs, and pleura are otherwise unchanged. No pneumothorax. IMPRESSION: The effusion with underlying opacity in the left base is mildly more prominent the interval. No other changes. No pneumothorax. Electronically Signed   By: Dorise Bullion III M.D.   On: 11/27/2021 07:59   DG Chest Port 1 View  Result Date: 11/26/2021 CLINICAL DATA:  Aortic valve replacement EXAM: PORTABLE CHEST 1 VIEW COMPARISON:  November 25, 2021 FINDINGS: The PA catheter has been removed with a right IJ sheath remaining. The patient is status post aortic valve replacement. Stable cardiomegaly. The hila and mediastinum are unchanged. No pneumothorax. The right lung is clear. Effusion and underlying atelectasis remain in the left base. No other interval changes. IMPRESSION: 1. Support apparatus as above. 2. Persistent small effusion and underlying atelectasis in the left base. 3. No other changes or acute abnormalities. Electronically Signed   By: Dorise Bullion III M.D.   On: 11/26/2021 08:30   DG Chest Port 1 View  Result Date: 11/25/2021 CLINICAL DATA:  AVR yesterday EXAM: PORTABLE CHEST 1 VIEW COMPARISON:  AP  chest 11/24/2021 FINDINGS: Interval extubation. Status post median sternotomy. Aortic valve prosthesis is again seen. Right internal jugular central venous catheter sheath with indwelling pulmonary arterial catheter that is advanced now within the more distal aspect of the pulmonary outflow tract compared to the proximal aspect of the pulmonary outflow tract previously. Interval removal of nasogastric tube. Midline mediastinal drain is unchanged. Additional inferior medial left hemithorax mediastinal drain is again seen. Cardiac silhouette is again mildly enlarged. Mild calcification is again seen within aortic arch. Mild bilateral interstitial thickening. A retrocardiac opacity is again seen. No pneumothorax is seen. IMPRESSION: 1. Interval extubation and removal of nasogastric tube. 2. Interval advancement of Swan-Ganz catheter with tip overlying the more distal aspect of the pulmonary outflow tract corona possibly extending into the right main pulmonary artery. 3. Left retrocardiac opacity, possible atelectasis, appears unchanged. Electronically Signed   By: Yvonne Kendall M.D.   On: 11/25/2021 08:13   DG Chest Port 1 View  Result Date: 11/24/2021 CLINICAL DATA:  [id number] possible pneumothorax EXAM: PORTABLE CHEST 1 VIEW COMPARISON:  November 22, 2021 FINDINGS: There has been interval sternotomy with sternotomy wires and cardiac prosthetic valve placement. Tip of the endotracheal tube seen 3 cm above the carina and satisfactory. Tip of the NG tube is below the diaphragm. There is a right transjugular Swan-Ganz catheter seen with its tip at the main pulmonary artery. There is a mediastinal drainage tube. Cardiomegaly. Mild prominence of the central pulmonary vasculature. Mild left basilar atelectasis. No pneumothorax seen. IMPRESSION: There has been interval sternotomy and prosthetic cardiac valve placement. Cardiomegaly and mild central pulmonary vascular congestion. No pneumothorax. Supporting tubes as above.  Electronically Signed   By: Frazier Richards M.D.   On: 11/24/2021 14:05   ECHOCARDIOGRAM COMPLETE  Result Date: 11/23/2021    ECHOCARDIOGRAM REPORT   Patient Name:   Joanna Alvarado Date of Exam: 11/22/2021 Medical Rec #:  [id number]       Height:       68.0 in Accession #:    [id number]  Weight:       311.0 lb Date of Birth:  [birth year]       BSA:          2.466 m Patient Age:    35 years        BP:           124/52 mmHg Patient Gender: F               HR:           60 bpm. Exam Location:  Outpatient Procedure: 2D Echo, 3D Echo, Cardiac Doppler and Color Doppler Indications:     Aortic regurgitation I35.1  History:         Patient has prior history of Echocardiogram examinations, most                  recent 09/06/2021. Risk Factors:Hypertension and Diabetes.  Sonographer:     Mikki Santee RDCS Referring Phys:  [place] Diagnosing Phys: Adrian Prows MD IMPRESSIONS  1. Left ventricular ejection fraction, by estimation, is 60 to 65%. Left ventricular ejection fraction by PLAX is 61 %. The left ventricle has normal function. The left ventricle has no regional wall motion abnormalities. The left ventricular internal cavity size was moderately dilated. Left ventricular diastolic parameters were normal.  2. Right ventricular systolic function is normal. The right ventricular size is normal. There is normal pulmonary artery systolic pressure.  3. Left atrial size was mildly dilated.  4. The mitral valve is normal in structure. Mild mitral valve regurgitation. No evidence of mitral stenosis.  5. The aortic valve is tricuspid. Aortic valve regurgitation is moderate to severe. Aortic regurgitation PHT measures 398 msec.  6. There is mild dilatation of the ascending aorta and of the aortic root, measuring 41 mm. Comparison(s): No significant change from prior study. 09/06/2021. Previously LV was mildly dilated at 5.7 cm now 6.0 cm. FINDINGS  Left Ventricle: Left ventricular ejection fraction, by estimation, is  60 to 65%. Left ventricular ejection fraction by PLAX is 61 %. The left ventricle has normal function. The left ventricle has no regional wall motion abnormalities. The left ventricular internal cavity size was moderately dilated. There is no left ventricular hypertrophy. Left ventricular diastolic parameters were normal. Right Ventricle: The right ventricular size is normal. No increase in right ventricular wall thickness. Right ventricular systolic function is normal. There is normal pulmonary artery systolic pressure. Left Atrium: Left atrial size was mildly dilated. Right Atrium: Right atrial size was normal in size. Pericardium: There is no evidence of pericardial effusion. Mitral Valve: The mitral valve is normal in structure. Mild mitral valve regurgitation. No evidence of mitral valve stenosis. Tricuspid Valve: The tricuspid valve is normal in structure. Tricuspid valve regurgitation is mild . No evidence of tricuspid stenosis. Aortic Valve: The aortic valve is tricuspid. Aortic valve regurgitation is moderate to severe. Aortic regurgitation PHT measures 398 msec. Aortic valve mean gradient measures 9.0 mmHg. Aortic valve peak gradient measures 16.5 mmHg. Aortic valve area, by VTI measures 2.53 cm. Pulmonic Valve: The pulmonic valve was normal in structure. Pulmonic valve regurgitation is not visualized. No evidence of pulmonic stenosis. Aorta: The aortic root is normal in size and structure. There is mild dilatation of the ascending aorta and of the aortic root, measuring 41 mm. IAS/Shunts: There is redundancy of the interatrial septum. No atrial level shunt detected by color flow Doppler.  LEFT VENTRICLE PLAX 2D LV EF:  Left            Diastology                ventricular     LV e' medial:    8.27 cm/s                ejection        LV E/e' medial:  10.3                fraction by     LV e' lateral:   8.51 cm/s                PLAX is 61      LV E/e' lateral: 10.0                %. LVIDd:          6.00 cm LVIDs:         4.00 cm LV PW:         1.00 cm LV IVS:        1.10 cm LVOT diam:     2.50 cm LV SV:         131 LV SV Index:   53 LVOT Area:     4.91 cm  RIGHT VENTRICLE RV Basal diam:  3.70 cm RV Mid diam:    3.10 cm RV S prime:     12.50 cm/s TAPSE (M-mode): 2.1 cm LEFT ATRIUM             Index        RIGHT ATRIUM           Index LA diam:        3.70 cm 1.50 cm/m   RA Area:     16.10 cm LA Vol (A2C):   79.6 ml 32.29 ml/m  RA Volume:   36.10 ml  14.64 ml/m LA Vol (A4C):   60.4 ml 24.50 ml/m LA Biplane Vol: 73.3 ml 29.73 ml/m  AORTIC VALVE AV Area (Vmax):    2.56 cm AV Area (Vmean):   2.42 cm AV Area (VTI):     2.53 cm AV Vmax:           203.00 cm/s AV Vmean:          141.000 cm/s AV VTI:            0.517 m AV Peak Grad:      16.5 mmHg AV Mean Grad:      9.0 mmHg LVOT Vmax:         106.00 cm/s LVOT Vmean:        69.500 cm/s LVOT VTI:          0.266 m LVOT/AV VTI ratio: 0.51 AI PHT:            398 msec  AORTA Ao Root diam: 3.80 cm Ao Asc diam:  4.10 cm MITRAL VALVE MV Area (PHT): 3.03 cm    SHUNTS MV Decel Time: 250 msec    Systemic VTI:  0.27 m MV E velocity: 85.20 cm/s  Systemic Diam: 2.50 cm MV A velocity: 68.30 cm/s MV E/A ratio:  1.25 Adrian Prows MD Electronically signed by Adrian Prows MD Signature Date/Time: 11/23/2021/5:45:34 PM    Final    DG Chest 2 View  Result Date: 11/23/2021 CLINICAL DATA:  Preoperative evaluation. EXAM: CHEST - 2 VIEW COMPARISON:  Chest radiograph 07/04/2019 FINDINGS: The heart size and mediastinal contours are within normal limits. Both lungs are  clear. The visualized skeletal structures are unremarkable. IMPRESSION: No active cardiopulmonary disease. Electronically Signed   By: Lovey Newcomer M.D.   On: 11/23/2021 07:56   VAS US DOPPLER PRE CABG  Result Date: 11/22/2021 PREOPERATIVE VASCULAR EVALUATION Patient Name:  Joanna Alvarado  Date of Exam:   11/22/2021 Medical Rec #: [id number]        Accession #:    [id number] Date of Birth: [birth year]        Patient Gender: F  Patient Age:   48 years Exam [facility]  [facility] Procedure:      VAS US DOPPLER PRE CABG Referring Phys: Collier Salina Janice Bodine --------------------------------------------------------------------------------  Indications:      Pre-CABG. Risk Factors:     Diabetes. Other Factors:    Aortic valve disease. Comparison Study: No prior study Performing Technologist: Maudry Mayhew MHA, RVT, RDCS, RDMS  Examination Guidelines: A complete evaluation includes B-mode imaging, spectral Doppler, color Doppler, and power Doppler as needed of all accessible portions of each vessel. Bilateral testing is considered an integral part of a complete examination. Limited examinations for reoccurring indications may be performed as noted.  Right Carotid Findings: +----------+--------+--------+--------+--------+--------+           PSV cm/sEDV cm/sStenosisDescribeComments +----------+--------+--------+--------+--------+--------+ CCA Prox  96                                       +----------+--------+--------+--------+--------+--------+ CCA Distal85      12                               +----------+--------+--------+--------+--------+--------+ ICA Prox  52      8                                +----------+--------+--------+--------+--------+--------+ ICA Distal97      14                               +----------+--------+--------+--------+--------+--------+ ECA       157                                      +----------+--------+--------+--------+--------+--------+ +----------+--------+-------+----------------+------------+           PSV cm/sEDV cmsDescribe        Arm Pressure +----------+--------+-------+----------------+------------+ Subclavian218            Multiphasic, WNL             +----------+--------+-------+----------------+------------+ +---------+--------+--+--------+--+---------+ VertebralPSV cm/s49EDV cm/s10Antegrade  +---------+--------+--+--------+--+---------+ Left Carotid Findings: +----------+-------+--------+--------+-------------------------------+---------+           PSV    EDV cm/sStenosisDescribe                       Comments            cm/s                                                            +----------+-------+--------+--------+-------------------------------+---------+ CCA Prox  119  9                                                        +----------+-------+--------+--------+-------------------------------+---------+ CCA Distal93     10                                                       +----------+-------+--------+--------+-------------------------------+---------+ ICA Prox  89     19              smooth, heterogenous and       Shadowing                                  calcific                                 +----------+-------+--------+--------+-------------------------------+---------+ ICA Distal76     19                                                       +----------+-------+--------+--------+-------------------------------+---------+ ECA       120                                                             +----------+-------+--------+--------+-------------------------------+---------+  +----------+--------+--------+----------------+------------+ SubclavianPSV cm/sEDV cm/sDescribe        Arm Pressure +----------+--------+--------+----------------+------------+           191             Multiphasic, WNL             +----------+--------+--------+----------------+------------+ +---------+--------+--+--------+-+---------+ VertebralPSV cm/s74EDV cm/s9Antegrade +---------+--------+--+--------+-+---------+  ABI Findings: +--------+------------------+-----+---------+--------+ Right   Rt Pressure (mmHg)IndexWaveform Comment  +--------+------------------+-----+---------+--------+ [id number]                     triphasic         +--------+------------------+-----+---------+--------+ +--------+------------------+-----+---------+-------+ Left    Lt Pressure (mmHg)IndexWaveform Comment +--------+------------------+-----+---------+-------+ [id number]                    triphasic        +--------+------------------+-----+---------+-------+  Right Doppler Findings: +-----------+--------+-----+---------+--------------------+ Site       PressureIndexDoppler  Comments             +-----------+--------+-----+---------+--------------------+ Brachial   124          triphasic                     +-----------+--------+-----+---------+--------------------+ Radial                  triphasic                     +-----------+--------+-----+---------+--------------------+ Ulnar  triphasic                     +-----------+--------+-----+---------+--------------------+ Palmar Arch                      Within normal limits +-----------+--------+-----+---------+--------------------+  Left Doppler Findings: +-----------+--------+-----+---------+-----------------------------------------+ Site       PressureIndexDoppler  Comments                                  +-----------+--------+-----+---------+-----------------------------------------+ Brachial   136          triphasic                                          +-----------+--------+-----+---------+-----------------------------------------+ Radial                  triphasic                                          +-----------+--------+-----+---------+-----------------------------------------+ Ulnar                   triphasic                                          +-----------+--------+-----+---------+-----------------------------------------+ Palmar Arch                      Signal is unaffected with radial                                           compression, obliterates with radial                                        compression                               +-----------+--------+-----+---------+-----------------------------------------+  Summary: Right Carotid: Velocities in the right ICA are consistent with a 1-39% stenosis. Left Carotid: Velocities in the left ICA are consistent with a 1-39% stenosis. Vertebrals:  Bilateral vertebral arteries demonstrate antegrade flow. Subclavians: Normal flow hemodynamics were seen in bilateral subclavian              arteries. Right Upper Extremity: Doppler waveforms remain within normal limits with right radial compression. Doppler waveforms remain within normal limits with right ulnar compression. Left Upper Extremity: Doppler waveforms remain within normal limits with left radial compression. Doppler waveform obliterate with left ulnar compression.  Electronically signed by Deitra Mayo MD on 11/22/2021 at 1:35:56 PM.    Final    CARDIAC CATHETERIZATION  Result Date: 11/08/2021 Right & Left Heart Catheterization 11/08/21: LV: 134/9, EDP 21 mmHg.  Ao 130/52, mean 97 mmHg.  Pulse pressure 82 mmHg.  Moderately elevated LVEDP. LM: Large vessel: Smooth and normal. LAD: Large caliber vessel.  Gives origin to a moderate-sized D1 which immediately bifurcates into secondary branch.  Ostium  has 85% focal stenosis.  D2 is moderate size, mild disease.  Mid segment of the LAD after D2 has 10 to 20% stenosis. CX: Gives origin to a small high OM1 followed by a moderate to large sized OM 2 and small OM 3 and OM 4.  AV groove circumflex is small to moderate-sized vessel with the ostium 10 to 20% stenosis. RCA: Dominant.  Mildly ectatic in the proximal segment.  Mild disease of 10 to 20% in the midsegment.  Slow flow in the right coronary artery. Ascending aortogram: There is moderate dilatation of the ascending aorta.  Aortic regurgitation is evident, severity was not assessed as it was a hand contrast injection. Right heart: RA 12/9, mean 8 mmHg RV 32/3, EDP  16 mmHg PA 27/6, mean 16 mmHg PA saturation 80%. PW: Not obtained as I had difficulty in advancing the PA catheter to the wedge position due to tortuosity of the peripheral vessels. CO 11.19 and CI 4.57.  QP/QS 1.00. Recommendation: Patient will be reassessed by Dr. Darcey Nora for consideration of [place] procedure.  D1 is small to moderate size, will discuss with him regarding revascularization versus medical therapy only.    Discharge Medications: Allergies as of 11/30/2021       Reactions   Metamucil [psyllium] Other (See Comments)   Severe constipation   Trulicity [dulaglutide]    KIDNEY INFECTION,INABILITY TO HAVE BOWEL MOVEMENTS,BLOOD IN URINE   Adhesive [tape]    Latex, band aids  causes whelts and blisters   Hydrocodone-acetaminophen Itching   Tolerates small/ infrequent doses   Latex Other (See Comments)   Band-aids causes whelts and blisters   Oxycontin [oxycodone Hcl]    "FELT LIKE HEAD WAS GOING TO EXPLODE"   Ciprofloxacin Rash   Yeast infection and a severe rash    Codeine Itching, Rash   Other Palpitations   Steroids      "makes me feel like I'm having a heart attack. -        Medication List     STOP taking these medications    Dexamethasone 20 MG Tabs   hydrochlorothiazide 12.5 MG capsule Commonly known as: MICROZIDE   isosorbide mononitrate 120 MG 24 hr tablet Commonly known as: IMDUR   losartan 50 MG tablet Commonly known as: COZAAR       TAKE these medications    acetaminophen 500 MG tablet Commonly known as: TYLENOL Take 1,000 mg by mouth every 6 (six) hours as needed for moderate pain.   budesonide-formoterol 80-4.5 MCG/ACT inhaler Commonly known as: SYMBICORT Inhale 2 puffs into the lungs 2 (two) times daily as needed (shortness of breath).   diclofenac 50 MG tablet Commonly known as: CATAFLAM Take 1 tablet (50 mg total) by mouth daily. Start taking on: December 05, 2021 What changed: These instructions start on December 05, 2021. If you are  unsure what to do until then, ask your doctor or other care provider.   famotidine 40 MG tablet Commonly known as: PEPCID Take 40 mg by mouth at bedtime.   furosemide 40 MG tablet Commonly known as: LASIX Take 1 tablet (40 mg total) by mouth daily. For one week then stop. Start taking on: December 01, 2021   levothyroxine 175 MCG tablet Commonly known as: SYNTHROID Take 175-262.5 mcg by mouth See admin instructions. Take 175 mcg by mouth daily, except on Tuesday take 262.5 mcg   lidocaine 5 % Commonly known as: LIDODERM Place 1 patch onto the skin daily as needed (back, hip and knee  pain). Remove & Discard patch within 12 hours or as directed by MD   linaclotide 290 MCG Caps capsule Commonly known as: LINZESS Take 290 mcg by mouth daily before breakfast.   metoprolol tartrate 25 MG tablet Commonly known as: LOPRESSOR Take 1 tablet (25 mg total) by mouth 2 (two) times daily.   nitroGLYCERIN 0.4 MG SL tablet Commonly known as: NITROSTAT Place 0.4 mg under the tongue every 5 (five) minutes as needed for chest pain.   omeprazole 40 MG capsule Commonly known as: PRILOSEC Take 40 mg by mouth daily.   polyethylene glycol 17 g packet Commonly known as: MIRALAX / GLYCOLAX Take 17 g by mouth daily.   potassium chloride SA 20 MEQ tablet Commonly known as: KLOR-CON M Take 1 tablet (20 mEq total) by mouth daily. For 7 days then stop. Start taking on: December 01, 2021   rosuvastatin 5 MG tablet Commonly known as: CRESTOR TAKE ONE TABLET BY MOUTH DAILY   traMADol 50 MG tablet Commonly known as: ULTRAM Take 1 tablet (50 mg total) by mouth every 4 (four) hours as needed for moderate pain.               Durable Medical Equipment  (From admission, onward)           Start     Ordered   11/29/21 1443  For home use only DME 3 n 1  Once       Comments: Bariatric size secondary to body habitus   11/29/21 1443           The patient has been discharged on:   1.Beta  Blocker:  Yes [ x  ]                              No   [   ]                              If No, reason:  2.Ace Inhibitor/ARB: Yes [   ]                                     No  [  x  ]                                     If No, reason:Labile BP  3.Statin:   Yes [ x  ]                  No  [   ]                  If No, reason:  4.Ecasa:  Yes  [   ]                  No   [  x ]                  If No, reason:ITP (thrombocytopenia)  Patient had ACS upon admission:No  Plavix/P2Y12 inhibitor: Yes [   ]  No  [  x ]   Follow Up Appointments:  Follow-up Information     Adrian Prows, MD. Go on 12/20/2021.   Specialty: Cardiology Why: Appointment time is at 3:30 pm Contact information: [place] [address] [phone]         TCTS PA. Go on 12/26/2021.   Why: PA/LAT CXR to be taken (at [facility] which is in the same building as Dr. Lucianne Lei Trigt's office) on 07/24 at 3:00 pm; Appointment time is at 3:30 pm        Echocardiogram. Go on 01/13/2022.   Why: Appointment time is at 2:15 pm Contact information: [facility] [address] [place] [address]        Care, [facility] Follow up.   Specialty: [place] Why: HHPT/OT arranged- they will contact you to schedule home visits from [place] covering office. Contact information: [address] [place] [address] [phone]         Llc, [facility] Follow up.   Why: ([facility])- bariatric 3n1- arranged- they will contact you regarding cost- if you are agreeable to cost they will deliver to room prior to discharge- otherwise you can look for one post discharge and pay out of pocket at location of your choice. Contact information: [place] [address] [phone]                 Signed: Sharalyn Ink [facility] 11/30/2021, 1:35 PM  patient examined and medical  record reviewed,agree with above note. Discharge instructions reviewed with the patient. Dahlia Byes 12/02/2021

## 2021-11-24 NOTE — Hospital Course (Addendum)
HPI: This is a 66 year old 300 pound nondiabetic returns for further discussion of her symptomatic moderate to severe aortic insufficiency.  She is a patient of Dr. Irven Shelling.  She had repeat left and right heart cath and follow-up of her study in 2021.  This shows mild pulmonary hypertension, normal cardiac output, and clean coronaries with the exception of a small first diagonal branch of the LAD with a 75% stenosis but the vessel is too small for her to benefit from CABG.   Patient was evaluated by her dentist and cleared for valve replacement surgery.   The patient had pulmonary function test which were satisfactory with good mechanics of breathing.   The patient's von Willebrand's panel was also within normal limits.  She has history of thrombocytopenia and probable ITP followed by hematology.  Last platelet count was 80,000.  She has had previous surgical procedures without bleeding issues.   The patient is now ready to proceed with aortic valve replacement.  We both agree that a bioprosthetic valve would be her best choice for AVR. Potential risks, benefits, and complications of the surgery were discussed with the patient and she agreed to proceed with surgery.   Hospital Course: Patient underwent an aortic valve replacement (bioprosthetic, size 25 mm) on 11/24/2021.

## 2021-11-24 NOTE — Transfer of Care (Signed)
Immediate Anesthesia Transfer of Care Note  Patient: Joanna Alvarado  Procedure(s) Performed: AORTIC VALVE REPLACEMENT (AVR) USING 25MM INSPIRIS RESILIA  AORTIC VALVE (Chest) TRANSESOPHAGEAL ECHOCARDIOGRAM (TEE)  Patient Location: PACU and SICU  Anesthesia Type:General  Level of Consciousness: Patient remains intubated per anesthesia plan  Airway & Oxygen Therapy: Patient placed on Ventilator (see vital sign flow sheet for setting)  Post-op Assessment: Report given to RN and Post -op Vital signs reviewed and stable  Post vital signs: Reviewed and stable  Last Vitals:  Vitals Value Taken Time  BP 101/47   Temp    Pulse 90   Resp    SpO2 97     Last Pain:  Vitals:   11/24/21 0557  TempSrc: Oral         Complications:  Encounter Notable Events  Notable Event Outcome Phase Comment  Difficult to intubate - unexpected  Intraprocedure Filed from anesthesia note documentation.

## 2021-11-24 NOTE — Progress Notes (Signed)
Patient was placed on CPAP 8 cmH2O via her home nasal mask and hospital provided machine with 4 L O2 bleed in .  Patient is tolerating at this time.  RT will continue to monitor.

## 2021-11-24 NOTE — Anesthesia Procedure Notes (Signed)
Arterial Line Insertion Start/End6/22/2023 6:50 AM, 11/24/2021 6:59 AM Performed by: Albertha Ghee, MD, Anastasio Auerbach, CRNA, CRNA  Patient location: Pre-op. Preanesthetic checklist: patient identified, IV checked, site marked, risks and benefits discussed, surgical consent, monitors and equipment checked, pre-op evaluation, timeout performed and anesthesia consent Lidocaine 1% used for infiltration Left, radial was placed Catheter size: 20 G Hand hygiene performed , maximum sterile barriers used  and Seldinger technique used  Attempts: 1 Procedure performed without using ultrasound guided technique. Following insertion, dressing applied and Biopatch. Post procedure assessment: normal and unchanged  Patient tolerated the procedure well with no immediate complications.

## 2021-11-25 ENCOUNTER — Inpatient Hospital Stay (HOSPITAL_COMMUNITY): Payer: HMO

## 2021-11-25 ENCOUNTER — Encounter (HOSPITAL_COMMUNITY): Payer: Self-pay | Admitting: Cardiothoracic Surgery

## 2021-11-25 LAB — POCT I-STAT 7, (LYTES, BLD GAS, ICA,H+H)
Acid-base deficit: 3 mmol/L — ABNORMAL HIGH (ref 0.0–2.0)
Bicarbonate: 21.5 mmol/L (ref 20.0–28.0)
Calcium, Ion: 1.24 mmol/L (ref 1.15–1.40)
HCT: 26 % — ABNORMAL LOW (ref 36.0–46.0)
Hemoglobin: 8.8 g/dL — ABNORMAL LOW (ref 12.0–15.0)
O2 Saturation: 96 %
Patient temperature: 98
Potassium: 4 mmol/L (ref 3.5–5.1)
Sodium: 140 mmol/L (ref 135–145)
TCO2: 23 mmol/L (ref 22–32)
pCO2 arterial: 33.1 mmHg (ref 32–48)
pH, Arterial: 7.419 (ref 7.35–7.45)
pO2, Arterial: 77 mmHg — ABNORMAL LOW (ref 83–108)

## 2021-11-25 LAB — PREPARE FRESH FROZEN PLASMA
Unit division: 0
Unit division: 0

## 2021-11-25 LAB — MAGNESIUM
Magnesium: 3.3 mg/dL — ABNORMAL HIGH (ref 1.7–2.4)
Magnesium: 3.1 mg/dL — ABNORMAL HIGH (ref 1.7–2.4)

## 2021-11-25 LAB — CBC
HCT: 28 % — ABNORMAL LOW (ref 36.0–46.0)
HCT: 27.2 % — ABNORMAL LOW (ref 36.0–46.0)
Hemoglobin: 9.6 g/dL — ABNORMAL LOW (ref 12.0–15.0)
Hemoglobin: 9.3 g/dL — ABNORMAL LOW (ref 12.0–15.0)
MCH: 31.7 pg (ref 26.0–34.0)
MCH: 32 pg (ref 26.0–34.0)
MCHC: 34.3 g/dL (ref 30.0–36.0)
MCHC: 34.2 g/dL (ref 30.0–36.0)
MCV: 92.4 fL (ref 80.0–100.0)
MCV: 93.5 fL (ref 80.0–100.0)
Platelets: 78 10*3/uL — ABNORMAL LOW (ref 150–400)
Platelets: 58 10*3/uL — ABNORMAL LOW (ref 150–400)
RBC: 3.03 MIL/uL — ABNORMAL LOW (ref 3.87–5.11)
RBC: 2.91 MIL/uL — ABNORMAL LOW (ref 3.87–5.11)
RDW: 14.2 % (ref 11.5–15.5)
RDW: 13.9 % (ref 11.5–15.5)
WBC: 10.1 10*3/uL (ref 4.0–10.5)
WBC: 4.9 10*3/uL (ref 4.0–10.5)
nRBC: 0 % (ref 0.0–0.2)
nRBC: 0 % (ref 0.0–0.2)

## 2021-11-25 LAB — PREPARE PLATELET PHERESIS
Unit division: 0
Unit division: 0
Unit division: 0

## 2021-11-25 LAB — GLUCOSE, CAPILLARY
Glucose-Capillary: 121 mg/dL — ABNORMAL HIGH (ref 70–99)
Glucose-Capillary: 136 mg/dL — ABNORMAL HIGH (ref 70–99)
Glucose-Capillary: 137 mg/dL — ABNORMAL HIGH (ref 70–99)
Glucose-Capillary: 139 mg/dL — ABNORMAL HIGH (ref 70–99)
Glucose-Capillary: 142 mg/dL — ABNORMAL HIGH (ref 70–99)
Glucose-Capillary: 146 mg/dL — ABNORMAL HIGH (ref 70–99)
Glucose-Capillary: 155 mg/dL — ABNORMAL HIGH (ref 70–99)
Glucose-Capillary: 174 mg/dL — ABNORMAL HIGH (ref 70–99)
Glucose-Capillary: 186 mg/dL — ABNORMAL HIGH (ref 70–99)

## 2021-11-25 LAB — BPAM CRYOPRECIPITATE
Blood Product Expiration Date: 202306221800
Blood Product Expiration Date: 202306221800
ISSUE DATE / TIME: 202306221218
ISSUE DATE / TIME: 202306221218
Unit Type and Rh: 5100
Unit Type and Rh: 5100

## 2021-11-25 LAB — BPAM PLATELET PHERESIS
Blood Product Expiration Date: 202306232359
Blood Product Expiration Date: 202306242359
Blood Product Expiration Date: 202306252359
ISSUE DATE / TIME: 202306221119
ISSUE DATE / TIME: 202306221119
ISSUE DATE / TIME: 202306222011
Unit Type and Rh: 6200
Unit Type and Rh: 6200
Unit Type and Rh: 6200

## 2021-11-25 LAB — SURGICAL PATHOLOGY

## 2021-11-25 LAB — BASIC METABOLIC PANEL
Anion gap: 9 (ref 5–15)
Anion gap: 9 (ref 5–15)
BUN: 29 mg/dL — ABNORMAL HIGH (ref 8–23)
BUN: 35 mg/dL — ABNORMAL HIGH (ref 8–23)
CO2: 21 mmol/L — ABNORMAL LOW (ref 22–32)
CO2: 21 mmol/L — ABNORMAL LOW (ref 22–32)
Calcium: 8.5 mg/dL — ABNORMAL LOW (ref 8.9–10.3)
Calcium: 8.5 mg/dL — ABNORMAL LOW (ref 8.9–10.3)
Chloride: 109 mmol/L (ref 98–111)
Chloride: 105 mmol/L (ref 98–111)
Creatinine, Ser: 1.22 mg/dL — ABNORMAL HIGH (ref 0.44–1.00)
Creatinine, Ser: 1.42 mg/dL — ABNORMAL HIGH (ref 0.44–1.00)
GFR, Estimated: 49 mL/min — ABNORMAL LOW (ref >60)
GFR, Estimated: 41 mL/min — ABNORMAL LOW (ref >60)
Glucose, Bld: 118 mg/dL — ABNORMAL HIGH (ref 70–99)
Glucose, Bld: 192 mg/dL — ABNORMAL HIGH (ref 70–99)
Potassium: 3.9 mmol/L (ref 3.5–5.1)
Potassium: 3.7 mmol/L (ref 3.5–5.1)
Sodium: 139 mmol/L (ref 135–145)
Sodium: 135 mmol/L (ref 135–145)

## 2021-11-25 LAB — BPAM FFP
Blood Product Expiration Date: 202306222359
Blood Product Expiration Date: 202306222359
ISSUE DATE / TIME: 202306221119
ISSUE DATE / TIME: 202306221119
Unit Type and Rh: 7300
Unit Type and Rh: 7300

## 2021-11-25 LAB — PREPARE CRYOPRECIPITATE
Unit division: 0
Unit division: 0

## 2021-11-25 MED ORDER — MIDODRINE HCL 5 MG PO TABS
5.0000 mg | ORAL_TABLET | Freq: Two times a day (BID) | ORAL | Status: DC
  Administered 2021-11-25: 5 mg via ORAL
  Filled 2021-11-25: qty 1

## 2021-11-25 MED ORDER — FUROSEMIDE 10 MG/ML IJ SOLN
20.0000 mg | Freq: Two times a day (BID) | INTRAMUSCULAR | Status: DC
  Administered 2021-11-25 – 2021-11-26 (×3): 20 mg via INTRAVENOUS
  Filled 2021-11-25 (×3): qty 2

## 2021-11-25 MED ORDER — INSULIN ASPART 100 UNIT/ML IJ SOLN
0.0000 [IU] | INTRAMUSCULAR | Status: DC
  Administered 2021-11-25: 2 [IU] via SUBCUTANEOUS
  Administered 2021-11-25: 4 [IU] via SUBCUTANEOUS
  Administered 2021-11-26 (×2): 2 [IU] via SUBCUTANEOUS
  Administered 2021-11-26: 8 [IU] via SUBCUTANEOUS
  Administered 2021-11-27 (×2): 2 [IU] via SUBCUTANEOUS

## 2021-11-25 MED ORDER — INSULIN DETEMIR 100 UNIT/ML ~~LOC~~ SOLN
10.0000 [IU] | Freq: Two times a day (BID) | SUBCUTANEOUS | Status: DC
  Administered 2021-11-25: 10 [IU] via SUBCUTANEOUS
  Filled 2021-11-25 (×2): qty 0.1

## 2021-11-25 MED ORDER — MIDAZOLAM HCL 2 MG/2ML IJ SOLN: 2.0000 mg | Freq: Four times a day (QID) | INTRAMUSCULAR | Status: DC | PRN

## 2021-11-25 MED ORDER — ALBUMIN HUMAN 5 % IV SOLN: 250.0000 mL | INTRAVENOUS | Status: AC | PRN

## 2021-11-25 MED ORDER — MORPHINE SULFATE (PF) 2 MG/ML IV SOLN: 1.0000 mg | INTRAVENOUS | Status: DC | PRN

## 2021-11-25 MED ORDER — INSULIN DETEMIR 100 UNIT/ML ~~LOC~~ SOLN: 8.0000 [IU] | Freq: Two times a day (BID) | SUBCUTANEOUS | Status: DC

## 2021-11-25 MED ORDER — MIDODRINE HCL 5 MG PO TABS
5.0000 mg | ORAL_TABLET | Freq: Three times a day (TID) | ORAL | Status: DC
  Administered 2021-11-26 (×3): 5 mg via ORAL
  Filled 2021-11-25 (×4): qty 1

## 2021-11-25 MED ORDER — INSULIN DETEMIR 100 UNIT/ML ~~LOC~~ SOLN
15.0000 [IU] | Freq: Two times a day (BID) | SUBCUTANEOUS | Status: DC
  Administered 2021-11-25 – 2021-11-27 (×4): 15 [IU] via SUBCUTANEOUS
  Filled 2021-11-25 (×7): qty 0.15

## 2021-11-25 NOTE — Progress Notes (Signed)
EVENING ROUNDS NOTE :     [address]       [place] [address]             [phone]                 1 Day Post-Op Procedure(s) (LRB): AORTIC VALVE REPLACEMENT (AVR) USING INSPIRIS RESILIA  AORTIC VALVE (N/A) TRANSESOPHAGEAL ECHOCARDIOGRAM (TEE) (N/A)   Total Length of Stay:  LOS: 1 day  Events:   No events Up to chair    BP (!) 111/54   Pulse 89   Temp 97.9 F (36.6 C) (Oral)   Resp 17   Ht 5\' 8"  (1.727 m)   Wt 134.5 kg   SpO2 96%   BMI 45.09 kg/m   PAP: (17-34)/(8-21) 29/14 CO:  [8 L/min-9.5 L/min] 9.5 L/min CI:  [3.2 L/min/m2-3.8 L/min/m2] 3.8 L/min/m2  Vent Mode: CPAP;PSV FiO2 (%):  [36 %-40 %] 36 % Set Rate:  [4 bmp] 4 bmp Vt Set:  [550 mL] 550 mL PEEP:  [5 cmH20] 5 cmH20 Pressure Support:  [10 cmH20] 10 cmH20   sodium chloride     sodium chloride     sodium chloride 10 mL/hr at 11/25/21 1000    ceFAZolin (ANCEF) IV 2 g (11/25/21 1639)   famotidine (PEPCID) IV Stopped (11/24/21 1608)   insulin 4.6 Units/hr (11/25/21 1000)   lactated ringers     lactated ringers 20 mL/hr at 11/25/21 1000   phenylephrine (NEO-SYNEPHRINE) Adult infusion Stopped (11/25/21 0746)    I/O last 3 completed shifts: In: 6959.2 [P.O.:960; I.V.:2330.8; BJYNW:2956; IV Piggyback:1081.4] Out: 2977 [Urine:1425; Blood:912; Chest Tube:640]      Latest Ref Rng & Units 11/25/2021    4:00 AM 11/24/2021    8:08 PM 11/24/2021    8:07 PM  CBC  WBC 4.0 - 10.5 K/uL 10.1   11.0   Hemoglobin 12.0 - 15.0 g/dL 9.6  8.2  9.0   Hematocrit 36.0 - 46.0 % 28.0  24.0  26.1   Platelets 150 - 400 K/uL 78   75        Latest Ref Rng & Units 11/25/2021    4:00 AM 11/24/2021    8:08 PM 11/24/2021    8:07 PM  BMP  Glucose 70 - 99 mg/dL 213   086   BUN 8 - 23 mg/dL 29   29   Creatinine 5.78 - 1.00 mg/dL 4.69   6.29   Sodium 528 - 145 mmol/L 139  142  142   Potassium 3.5 - 5.1 mmol/L 3.9  4.3  4.2   Chloride 98 - 111 mmol/L 109   112   CO2 22 - 32 mmol/L 21   23   Calcium  8.9 - 10.3 mg/dL 8.5   8.6     ABG    Component Value Date/Time   PHART 7.429 11/24/2021 2008   PCO2ART 33.9 11/24/2021 2008   PO2ART 61 (L) 11/24/2021 2008   HCO3 22.6 11/24/2021 2008   TCO2 24 11/24/2021 2008   ACIDBASEDEF 2.0 11/24/2021 2008   O2SAT 93 11/24/2021 2008       Brynda Greathouse, MD 11/25/2021 4:53 PM

## 2021-11-26 ENCOUNTER — Inpatient Hospital Stay (HOSPITAL_COMMUNITY): Payer: HMO

## 2021-11-26 LAB — CBC
HCT: 24.4 % — ABNORMAL LOW (ref 36.0–46.0)
Hemoglobin: 8.1 g/dL — ABNORMAL LOW (ref 12.0–15.0)
MCH: 31.4 pg (ref 26.0–34.0)
MCHC: 33.2 g/dL (ref 30.0–36.0)
MCV: 94.6 fL (ref 80.0–100.0)
Platelets: 59 10*3/uL — ABNORMAL LOW (ref 150–400)
RBC: 2.58 MIL/uL — ABNORMAL LOW (ref 3.87–5.11)
RDW: 14 % (ref 11.5–15.5)
WBC: 7.5 10*3/uL (ref 4.0–10.5)
nRBC: 0 % (ref 0.0–0.2)

## 2021-11-26 LAB — GLUCOSE, CAPILLARY
Glucose-Capillary: 143 mg/dL — ABNORMAL HIGH (ref 70–99)
Glucose-Capillary: 230 mg/dL — ABNORMAL HIGH (ref 70–99)
Glucose-Capillary: 137 mg/dL — ABNORMAL HIGH (ref 70–99)
Glucose-Capillary: 104 mg/dL — ABNORMAL HIGH (ref 70–99)
Glucose-Capillary: 117 mg/dL — ABNORMAL HIGH (ref 70–99)

## 2021-11-26 LAB — BASIC METABOLIC PANEL
Anion gap: 10 (ref 5–15)
BUN: 37 mg/dL — ABNORMAL HIGH (ref 8–23)
CO2: 22 mmol/L (ref 22–32)
Calcium: 8.7 mg/dL — ABNORMAL LOW (ref 8.9–10.3)
Chloride: 103 mmol/L (ref 98–111)
Creatinine, Ser: 1.51 mg/dL — ABNORMAL HIGH (ref 0.44–1.00)
GFR, Estimated: 38 mL/min — ABNORMAL LOW (ref >60)
Glucose, Bld: 134 mg/dL — ABNORMAL HIGH (ref 70–99)
Potassium: 3.8 mmol/L (ref 3.5–5.1)
Sodium: 135 mmol/L (ref 135–145)

## 2021-11-26 LAB — BPAM PLATELET PHERESIS
Blood Product Expiration Date: 202306252359
Blood Product Expiration Date: 202306262359
ISSUE DATE / TIME: 202306231000
ISSUE DATE / TIME: 202306231902
Unit Type and Rh: 5100
Unit Type and Rh: 600

## 2021-11-26 LAB — PREPARE PLATELET PHERESIS
Unit division: 0
Unit division: 0

## 2021-11-26 NOTE — Evaluation (Signed)
Occupational Therapy Evaluation Patient Details Name: Joanna Alvarado MRN: [id number] DOB: May 09, 1956 Today's Date: 11/26/2021   History of Present Illness 66 yo female admitted 6/22 for AVR. PMHx: HTN, pulmonary HTN, T2DM, bil knee OA, fibromyalgia   Clinical Impression   Patient admitted for the diagnosis and subsequent procedure.  PTA she lives at home with her spouse, who is able to assist as needed.  Current deficits impacting independence are listed below, but decreased activity tolerance and generalized weakness are primary.  Patient needing Min VC's for sternal precautions, and currently needing a lot of assist with UB and LB ADL.  OT will continue efforts in the acute setting, with [facility] OT recommended for post acute rehab.        Recommendations for follow up therapy are one component of a multi-disciplinary discharge planning process, led by the attending physician.  Recommendations may be updated based on patient status, additional functional criteria and insurance authorization.   Follow Up Recommendations  Home health OT    Assistance Recommended at Discharge Intermittent Supervision/Assistance  Patient can return home with the following A lot of help with bathing/dressing/bathroom;A little help with walking and/or transfers;Assist for transportation;Assistance with cooking/housework    Functional Status Assessment  Patient has had a recent decline in their functional status and demonstrates the ability to make significant improvements in function in a reasonable and predictable amount of time.  Equipment Recommendations  None recommended by OT    Recommendations for Other Services       Precautions / Restrictions Precautions Precautions: Sternal;Fall Precaution Comments: mediastinal tube, external pacer Restrictions Weight Bearing Restrictions: Yes RUE Weight Bearing: Partial weight bearing LUE Weight Bearing: Partial weight bearing Other Position/Activity  Restrictions: In the tube      Mobility Bed Mobility Overal bed mobility: Needs Assistance Bed Mobility: Supine to Sit     Supine to sit: Min assist, HOB elevated     General bed mobility comments: small amout of assist to scoot hips to edge    Transfers Overall transfer level: Needs assistance   Transfers: Sit to/from Stand Sit to Stand: Min assist                  Balance Overall balance assessment: Needs assistance Sitting-balance support: Feet supported Sitting balance-Leahy Scale: Fair       Standing balance-Leahy Scale: Poor Standing balance comment: bil UE support on RW                           ADL either performed or assessed with clinical judgement   ADL       Grooming: Wash/dry hands;Set up;Sitting           Upper Body Dressing : Moderate assistance;Sitting   Lower Body Dressing: Maximal assistance;Sit to/from stand   Toilet Transfer: Minimal assistance;Rolling walker (2 wheels)   Toileting- Clothing Manipulation and Hygiene: Total assistance;Sit to/from stand               Vision Patient Visual Report: No change from baseline       Perception Perception Perception: Not tested   Praxis Praxis Praxis: Not tested    Pertinent Vitals/Pain Pain Assessment Pain Assessment: Faces Faces Pain Scale: Hurts little more Pain Location: GI Pain Descriptors / Indicators: Pressure Pain Intervention(s): Monitored during session     Hand Dominance Right   Extremity/Trunk Assessment Upper Extremity Assessment Upper Extremity Assessment: Overall WFL for tasks assessed   Lower Extremity Assessment  Lower Extremity Assessment: Defer to PT evaluation   Cervical / Trunk Assessment Cervical / Trunk Assessment: Other exceptions Cervical / Trunk Exceptions: increased body habitus   Communication Communication Communication: No difficulties   Cognition Arousal/Alertness: Awake/alert Behavior During Therapy: WFL for tasks  assessed/performed Overall Cognitive Status: Within Functional Limits for tasks assessed                                 General Comments: Reinforced movement precautions.     General Comments   VSS on 2.5 L    Exercises     Shoulder Instructions      Home Living Family/patient expects to be discharged to:: Private residence Living Arrangements: Spouse/significant other;Other relatives Available Help at Discharge: Family;Available 24 hours/day Type of Home: House Home Access: Stairs to enter [employer] of Steps: 3   Home Layout: Two level;Able to live on main level with bedroom/bathroom     Bathroom Shower/Tub: Chief Strategy Officer: Standard     Home Equipment: Agricultural consultant (2 wheels);Wheelchair - manual;Other (comment)          Prior Functioning/Environment Prior Level of Function : Independent/Modified Independent               ADLs Comments: continues to complete ADL and participate with iADL.        OT Problem List: Decreased strength;Decreased activity tolerance;Impaired balance (sitting and/or standing);Decreased knowledge of precautions      OT Treatment/Interventions: Self-care/ADL training;Therapeutic exercise;Therapeutic activities;Energy conservation;DME and/or AE instruction;Patient/family education;Balance training    OT Goals(Current goals can be found in the care plan section) Acute Rehab OT Goals Patient Stated Goal: Return home OT Goal Formulation: With patient Time For Goal Achievement: 12/10/21 Potential to Achieve Goals: Good ADL Goals Pt Will Perform Grooming: with set-up;standing Pt Will Perform Upper Body Dressing: with supervision;sitting Pt Will Perform Lower Body Dressing: with min assist;sit to/from stand;with adaptive equipment Pt Will Transfer to Toilet: with modified independence;regular height toilet;ambulating Pt/caregiver will Perform Home Exercise Program: Increased ROM;Both  right and left upper extremity;With Supervision;With written HEP provided  OT Frequency: Min 2X/week    Co-evaluation              AM-PAC OT "6 Clicks" Daily Activity     Outcome Measure Help from another person eating meals?: None Help from another person taking care of personal grooming?: None Help from another person toileting, which includes using toliet, bedpan, or urinal?: A Lot Help from another person bathing (including washing, rinsing, drying)?: A Lot Help from another person to put on and taking off regular upper body clothing?: A Lot Help from another person to put on and taking off regular lower body clothing?: A Lot 6 Click Score: 16   End of Session Equipment Utilized During Treatment: Rolling walker (2 wheels);Gait belt;Oxygen Nurse Communication: Mobility status  Activity Tolerance: Patient limited by fatigue Patient left: in chair;with call bell/phone within reach;with family/visitor present  OT Visit Diagnosis: Unsteadiness on feet (R26.81);Muscle weakness (generalized) (M62.81)                Time: 1610-9604 OT Time Calculation (min): 29 min Charges:  OT General Charges $OT Visit: 1 Visit OT Evaluation $OT Eval Moderate Complexity: 1 Mod OT Treatments $Therapeutic Activity: 8-22 mins  11/26/2021  RP, OTR/L  Acute Rehabilitation Services  Office:  [phone]   Suzanna Obey 11/26/2021, 11:41 AM

## 2021-11-26 NOTE — Progress Notes (Signed)
Patient is on CPAP at this time.

## 2021-11-27 ENCOUNTER — Inpatient Hospital Stay (HOSPITAL_COMMUNITY): Payer: HMO

## 2021-11-27 LAB — GLUCOSE, CAPILLARY
Glucose-Capillary: 108 mg/dL — ABNORMAL HIGH (ref 70–99)
Glucose-Capillary: 109 mg/dL — ABNORMAL HIGH (ref 70–99)
Glucose-Capillary: 111 mg/dL — ABNORMAL HIGH (ref 70–99)
Glucose-Capillary: 134 mg/dL — ABNORMAL HIGH (ref 70–99)
Glucose-Capillary: 143 mg/dL — ABNORMAL HIGH (ref 70–99)
Glucose-Capillary: 88 mg/dL (ref 70–99)

## 2021-11-27 LAB — CBC
HCT: 28.7 % — ABNORMAL LOW (ref 36.0–46.0)
Hemoglobin: 9.6 g/dL — ABNORMAL LOW (ref 12.0–15.0)
MCH: 31.2 pg (ref 26.0–34.0)
MCHC: 33.4 g/dL (ref 30.0–36.0)
MCV: 93.2 fL (ref 80.0–100.0)
Platelets: 66 10*3/uL — ABNORMAL LOW (ref 150–400)
RBC: 3.08 MIL/uL — ABNORMAL LOW (ref 3.87–5.11)
RDW: 13.6 % (ref 11.5–15.5)
WBC: 8.1 10*3/uL (ref 4.0–10.5)
nRBC: 0 % (ref 0.0–0.2)

## 2021-11-27 LAB — BASIC METABOLIC PANEL
Anion gap: 8 (ref 5–15)
BUN: 40 mg/dL — ABNORMAL HIGH (ref 8–23)
CO2: 24 mmol/L (ref 22–32)
Calcium: 8.7 mg/dL — ABNORMAL LOW (ref 8.9–10.3)
Chloride: 106 mmol/L (ref 98–111)
Creatinine, Ser: 1.32 mg/dL — ABNORMAL HIGH (ref 0.44–1.00)
GFR, Estimated: 44 mL/min — ABNORMAL LOW (ref >60)
Glucose, Bld: 107 mg/dL — ABNORMAL HIGH (ref 70–99)
Potassium: 3.7 mmol/L (ref 3.5–5.1)
Sodium: 138 mmol/L (ref 135–145)

## 2021-11-27 MED ORDER — SODIUM CHLORIDE 0.9% FLUSH: 3.0000 mL | INTRAVENOUS | Status: DC | PRN

## 2021-11-27 MED ORDER — ~~LOC~~ CARDIAC SURGERY, PATIENT & FAMILY EDUCATION: Freq: Once | Status: AC

## 2021-11-27 MED ORDER — SODIUM CHLORIDE 0.9% FLUSH
3.0000 mL | Freq: Two times a day (BID) | INTRAVENOUS | Status: DC
Start: 2021-11-27 — End: 2021-11-30
  Administered 2021-11-27 – 2021-11-30 (×7): 3 mL via INTRAVENOUS

## 2021-11-27 MED ORDER — POTASSIUM CHLORIDE CRYS ER 20 MEQ PO TBCR
20.0000 meq | EXTENDED_RELEASE_TABLET | ORAL | Status: AC
  Administered 2021-11-27 (×3): 20 meq via ORAL
  Filled 2021-11-27 (×3): qty 1

## 2021-11-27 MED ORDER — SODIUM CHLORIDE 0.9 % IV SOLN: 250.0000 mL | INTRAVENOUS | Status: DC | PRN

## 2021-11-28 ENCOUNTER — Ambulatory Visit: Payer: HMO | Admitting: Neurology

## 2021-11-28 ENCOUNTER — Inpatient Hospital Stay (HOSPITAL_COMMUNITY): Payer: HMO

## 2021-11-28 LAB — TYPE AND SCREEN
ABO/RH(D): O NEG
Antibody Screen: NEGATIVE
Unit division: 0
Unit division: 0
Unit division: 0

## 2021-11-28 LAB — BPAM RBC
Blood Product Expiration Date: 202307202359
Blood Product Expiration Date: 202307202359
Blood Product Expiration Date: 202307222359
ISSUE DATE / TIME: 202306221119
ISSUE DATE / TIME: 202306222114
Unit Type and Rh: 9500
Unit Type and Rh: 9500
Unit Type and Rh: 9500

## 2021-11-28 LAB — GLUCOSE, CAPILLARY
Glucose-Capillary: 129 mg/dL — ABNORMAL HIGH (ref 70–99)
Glucose-Capillary: 107 mg/dL — ABNORMAL HIGH (ref 70–99)

## 2021-11-28 LAB — BASIC METABOLIC PANEL
Anion gap: 7 (ref 5–15)
BUN: 33 mg/dL — ABNORMAL HIGH (ref 8–23)
CO2: 24 mmol/L (ref 22–32)
Calcium: 8.7 mg/dL — ABNORMAL LOW (ref 8.9–10.3)
Chloride: 108 mmol/L (ref 98–111)
Creatinine, Ser: 1.17 mg/dL — ABNORMAL HIGH (ref 0.44–1.00)
GFR, Estimated: 51 mL/min — ABNORMAL LOW (ref >60)
Glucose, Bld: 103 mg/dL — ABNORMAL HIGH (ref 70–99)
Potassium: 4.1 mmol/L (ref 3.5–5.1)
Sodium: 139 mmol/L (ref 135–145)

## 2021-11-28 LAB — CBC
HCT: 29 % — ABNORMAL LOW (ref 36.0–46.0)
Hemoglobin: 9.8 g/dL — ABNORMAL LOW (ref 12.0–15.0)
MCH: 32.1 pg (ref 26.0–34.0)
MCHC: 33.8 g/dL (ref 30.0–36.0)
MCV: 95.1 fL (ref 80.0–100.0)
Platelets: 68 10*3/uL — ABNORMAL LOW (ref 150–400)
RBC: 3.05 MIL/uL — ABNORMAL LOW (ref 3.87–5.11)
RDW: 13.6 % (ref 11.5–15.5)
WBC: 7.9 10*3/uL (ref 4.0–10.5)
nRBC: 0 % (ref 0.0–0.2)

## 2021-11-28 LAB — ECHO INTRAOPERATIVE TEE
Height: 68 in
P 1/2 time: 270 msec
Weight: 5072 oz

## 2021-11-28 MED ORDER — FUROSEMIDE 40 MG PO TABS
40.0000 mg | ORAL_TABLET | Freq: Every day | ORAL | Status: DC
  Administered 2021-11-28 – 2021-11-29 (×2): 40 mg via ORAL
  Filled 2021-11-28 (×2): qty 1

## 2021-11-28 MED ORDER — MIDODRINE HCL 5 MG PO TABS
5.0000 mg | ORAL_TABLET | Freq: Two times a day (BID) | ORAL | Status: DC
  Administered 2021-11-28 – 2021-11-29 (×4): 5 mg via ORAL
  Filled 2021-11-28 (×4): qty 1

## 2021-11-28 MED ORDER — POTASSIUM CHLORIDE CRYS ER 20 MEQ PO TBCR
20.0000 meq | EXTENDED_RELEASE_TABLET | Freq: Every day | ORAL | Status: DC
  Administered 2021-11-28 – 2021-11-30 (×3): 20 meq via ORAL
  Filled 2021-11-28 (×3): qty 1

## 2021-11-28 NOTE — Progress Notes (Signed)
Occupational Therapy Treatment Patient Details Name: Joanna Alvarado MRN: [id number] DOB: 04-Dec-1955 Today's Date: 11/28/2021   History of present illness 66 yo female admitted 6/22 for AVR. PMHx: HTN, pulmonary HTN, T2DM, bil knee OA, fibromyalgia   OT comments  Patient received in recliner and agreeable to OT session. Patient instructed on sock aide use for donning socks and mod assist to complete. Patient ambulated to bathroom with min guard for toilet transfer and performed toilet hygiene seated. Patient able to stand at sink for grooming tasks and performed functional mobility after without rest break. Patient instructed on doffing socks with reacher. Patient making good gains with OT session, acute OT to continue to follow.    Recommendations for follow up therapy are one component of a multi-disciplinary discharge planning process, led by the attending physician.  Recommendations may be updated based on patient status, additional functional criteria and insurance authorization.    Follow Up Recommendations  Home health OT    Assistance Recommended at Discharge Intermittent Supervision/Assistance  Patient can return home with the following  A lot of help with bathing/dressing/bathroom;A little help with walking and/or transfers;Assist for transportation;Assistance with cooking/housework   Equipment Recommendations  None recommended by OT    Recommendations for Other Services      Precautions / Restrictions Precautions Precautions: Sternal;Fall Precaution Booklet Issued: Yes (comment) Precaution Comments: mediastinal tube, external pacer Restrictions Weight Bearing Restrictions: Yes (sternal precautions) RUE Weight Bearing: Partial weight bearing LUE Weight Bearing: Partial weight bearing Other Position/Activity Restrictions: In the tube       Mobility Bed Mobility Overal bed mobility: Needs Assistance             General bed mobility comments: up in recliner     Transfers Overall transfer level: Needs assistance Equipment used: Rolling walker (2 wheels) Transfers: Sit to/from Stand Sit to Stand: Min assist           General transfer comment: increased time and used rocking technique to maintian sternal precaution s     Balance Overall balance assessment: Needs assistance Sitting-balance support: Feet supported Sitting balance-Leahy Scale: Fair     Standing balance support: Single extremity supported, Bilateral upper extremity supported Standing balance-Leahy Scale: Poor Standing balance comment: stood at sink for grooming tasks with supervision                           ADL either performed or assessed with clinical judgement   ADL Overall ADL's : Needs assistance/impaired     Grooming: Wash/dry hands;Wash/dry face;Oral care;Supervision/safety;Standing Grooming Details (indicate cue type and reason): at sink             Lower Body Dressing: Moderate assistance;Sitting/lateral leans;With adaptive equipment Lower Body Dressing Details (indicate cue type and reason): donned socks with sock aid and doffed with reacher Toilet Transfer: Min guard;Rolling walker (2 wheels)   Toileting- Clothing Manipulation and Hygiene: Supervision/safety;Sitting/lateral lean Toileting - Clothing Manipulation Details (indicate cue type and reason): able to perform toilet hygiene seated       General ADL Comments: states she has gone to bathroom with husband assisting for safety    Extremity/Trunk Assessment              Vision       Perception     Praxis      Cognition Arousal/Alertness: Awake/alert Behavior During Therapy: WFL for tasks assessed/performed Overall Cognitive Status: Within Functional Limits for tasks assessed Area of Impairment: Memory  Memory: Decreased recall of precautions         General Comments: reviewed precautions        Exercises      Shoulder  Instructions       General Comments      Pertinent Vitals/ Pain       Pain Assessment Pain Assessment: Faces Faces Pain Scale: Hurts a little bit Pain Location: GI Pain Descriptors / Indicators: Pressure Pain Intervention(s): Monitored during session  Home Living                                          Prior Functioning/Environment              Frequency  Min 2X/week        Progress Toward Goals  OT Goals(current goals can now be found in the care plan section)  Progress towards OT goals: Progressing toward goals  Acute Rehab OT Goals Patient Stated Goal: get better OT Goal Formulation: With patient Time For Goal Achievement: 12/10/21 Potential to Achieve Goals: Good ADL Goals Pt Will Perform Grooming: with set-up;standing Pt Will Perform Upper Body Dressing: with supervision;sitting Pt Will Perform Lower Body Dressing: with min assist;sit to/from stand;with adaptive equipment Pt Will Transfer to Toilet: with modified independence;regular height toilet;ambulating Pt/caregiver will Perform Home Exercise Program: Increased ROM;Both right and left upper extremity;With Supervision;With written HEP provided  Plan Discharge plan remains appropriate    Co-evaluation                 AM-PAC OT "6 Clicks" Daily Activity     Outcome Measure   Help from another person eating meals?: None Help from another person taking care of personal grooming?: None Help from another person toileting, which includes using toliet, bedpan, or urinal?: A Lot Help from another person bathing (including washing, rinsing, drying)?: A Lot Help from another person to put on and taking off regular upper body clothing?: A Lot Help from another person to put on and taking off regular lower body clothing?: A Lot 6 Click Score: 16    End of Session Equipment Utilized During Treatment: Rolling walker (2 wheels)  OT Visit Diagnosis: Unsteadiness on feet  (R26.81);Muscle weakness (generalized) (M62.81)   Activity Tolerance Patient tolerated treatment well   Patient Left in chair;with call bell/phone within reach;with family/visitor present   Nurse Communication Mobility status        Time: 1610-9604 OT Time Calculation (min): 26 min  Charges: OT General Charges $OT Visit: 1 Visit OT Treatments $Self Care/Home Management : 23-37 mins  Joanna Alvarado, Joanna Alvarado Acute Rehabilitation Services  Office [phone]   Joanna Alvarado 11/28/2021, 1:47 PM

## 2021-11-29 LAB — CBC
HCT: 29.5 % — ABNORMAL LOW (ref 36.0–46.0)
Hemoglobin: 10.1 g/dL — ABNORMAL LOW (ref 12.0–15.0)
MCH: 32.4 pg (ref 26.0–34.0)
MCHC: 34.2 g/dL (ref 30.0–36.0)
MCV: 94.6 fL (ref 80.0–100.0)
Platelets: 77 10*3/uL — ABNORMAL LOW (ref 150–400)
RBC: 3.12 MIL/uL — ABNORMAL LOW (ref 3.87–5.11)
RDW: 13.8 % (ref 11.5–15.5)
WBC: 6.9 10*3/uL (ref 4.0–10.5)
nRBC: 0 % (ref 0.0–0.2)

## 2021-11-29 LAB — BASIC METABOLIC PANEL
Anion gap: 8 (ref 5–15)
BUN: 27 mg/dL — ABNORMAL HIGH (ref 8–23)
CO2: 23 mmol/L (ref 22–32)
Calcium: 8.8 mg/dL — ABNORMAL LOW (ref 8.9–10.3)
Chloride: 109 mmol/L (ref 98–111)
Creatinine, Ser: 1.01 mg/dL — ABNORMAL HIGH (ref 0.44–1.00)
GFR, Estimated: >60 mL/min (ref >60)
Glucose, Bld: 108 mg/dL — ABNORMAL HIGH (ref 70–99)
Potassium: 4 mmol/L (ref 3.5–5.1)
Sodium: 140 mmol/L (ref 135–145)

## 2021-11-29 MED ORDER — METOPROLOL TARTRATE 25 MG PO TABS
25.0000 mg | ORAL_TABLET | Freq: Two times a day (BID) | ORAL | Status: DC
  Administered 2021-11-29 – 2021-11-30 (×3): 25 mg via ORAL
  Filled 2021-11-29 (×3): qty 1

## 2021-11-29 MED ORDER — METOPROLOL TARTRATE 5 MG/5ML IV SOLN
2.5000 mg | INTRAVENOUS | Status: AC
  Administered 2021-11-29: 2.5 mg via INTRAVENOUS
  Filled 2021-11-29: qty 5

## 2021-11-29 MED ORDER — ALPRAZOLAM 0.5 MG PO TABS: 0.5000 mg | ORAL_TABLET | Freq: Two times a day (BID) | ORAL | Status: DC | PRN

## 2021-11-29 MED ORDER — METOPROLOL TARTRATE 25 MG/10 ML ORAL SUSPENSION
25.0000 mg | Freq: Two times a day (BID) | ORAL | Status: DC
  Filled 2021-11-29 (×4): qty 10

## 2021-11-29 MED FILL — Electrolyte-R (PH 7.4) Solution: INTRAVENOUS | Qty: 3000 | Status: AC

## 2021-11-29 MED FILL — Sodium Chloride IV Soln 0.9%: INTRAVENOUS | Qty: 4000 | Status: AC

## 2021-11-29 MED FILL — Mannitol IV Soln 20%: INTRAVENOUS | Qty: 500 | Status: AC

## 2021-11-29 MED FILL — Sodium Bicarbonate IV Soln 8.4%: INTRAVENOUS | Qty: 50 | Status: AC

## 2021-11-29 MED FILL — Heparin Sodium (Porcine) Inj 1000 Unit/ML: INTRAMUSCULAR | Qty: 20 | Status: AC

## 2021-11-29 MED FILL — Lidocaine HCl Local Soln Prefilled Syringe 100 MG/5ML (2%): INTRAMUSCULAR | Qty: 5 | Status: AC

## 2021-11-29 NOTE — TOC Initial Note (Signed)
Transition of Care (TOC) - Initial/Assessment Note  Donn Pierini RN, BSN Transitions of Care Unit 4E- RN Case Manager See Treatment Team for direct phone #    Patient Details  Name: Joanna Alvarado MRN: [id number] Date of Birth: 1956/03/05  Transition of Care [facility]) CM/SW Contact:    Darrold Span, RN Phone Number: 11/29/2021, 4:39 PM  Clinical Narrative:                 Pt from home w/ spouse, s/p AVR. Orders placed for DME and HH needs.  CM in to speak w/ pt- spouse also at bedside.  Discussed HH and DME needs, pt reports she has RW at home, would like 3n1- wide one. Explained to pt insurance may not cover 3n1- pt agreeable to self pay for DME. Will have in house provider-[facility] process and call pt with cost for 3n1.  List provided for [facility] choice Per CMS guidelines from medicare.gov website with star ratings (copy placed in shadow chart) , pt voiced that she does not have a preference and will defer to this writer to secure one on her behalf as long as the are in network with HTA insurance.   Address, phone # and PCP all confirmed with pt in epic.   Call made to [facility] with [facility] to see if they had office referral- they do not- reviewed to see if they could accept- however due to staffing in [facility] unable accept at this time.   Calls made to other [facility] agencies- [facility]- per Merry Proud- they are unable to accept due to staffing in [facility]- VM left for intake- awaiting return call  CM will continue to call agencies and try to secure [facility] for pt in [place].   Call made to [facility] for DME- 3n1 need- they will contact pt regarding cost and deliver to room prior to discharge.   Expected Discharge Plan: Home w Home Health Services Barriers to Discharge: Continued Medical Work up   Patient Goals and CMS Choice Patient states their goals for this hospitalization and ongoing recovery are:: return home CMS Medicare.gov Compare Post Acute Care list provided  to:: Patient Choice offered to / list presented to : Patient  Expected Discharge Plan and Services Expected Discharge Plan: Home w Home Health Services   Discharge Planning Services: CM Consult Post Acute Care Choice: Durable Medical Equipment, Home Health Living arrangements for the past 2 months: Single Family Home                 DME Arranged: 3-N-1 DME Agency: [facility] Date DME Agency Contacted: 11/29/21 Time DME Agency Contacted: 1500 Representative spoke with at DME Agency: Arnold Long HH Arranged: PT, OT          Prior Living Arrangements/Services Living arrangements for the past 2 months: Single Family Home Lives with:: Spouse Patient language and need for interpreter reviewed:: Yes Do you feel safe going back to the place where you live?: Yes      Need for Family Participation in Patient Care: Yes (Comment) Care giver support system in place?: Yes (comment) Current home services: DME Criminal Activity/Legal Involvement Pertinent to Current Situation/Hospitalization: No - Comment as needed  Activities of Daily Living      Permission Sought/Granted Permission sought to share information with : Facility Industrial/product designer granted to share information with : Yes, Verbal Permission Granted     Permission granted to share info w AGENCY: HH/DME  Emotional Assessment Appearance:: Appears stated age Attitude/Demeanor/Rapport: Engaged Affect (typically observed): Accepting, Appropriate Orientation: : Oriented to Self, Oriented to Place, Oriented to  Time, Oriented to Situation Alcohol / Substance Use: Not Applicable Psych Involvement: No (comment)  Admission diagnosis:  Aortic valve insufficiency [I35.1] Patient Active Problem List   Diagnosis Date Noted   S/P aortic valve replacement with bioprosthetic valve 11/24/2021   Aortic valve insufficiency 11/24/2021   Aortic root dilatation (HCC)    Aortic valve disorder 10/17/2021    Thrombocytopenia (HCC) 04/03/2021   Moderate aortic regurgitation 02/14/2021   Angina pectoris (HCC) - Class II-III 09/26/2019   Abnormal myocardial perfusion study 09/26/2019   CKD (chronic kidney disease) stage 3, GFR 30-59 ml/min (HCC) 08/27/2019   History of colonic polyps    Benign neoplasm of cecum    Benign neoplasm of sigmoid colon    Type 2 diabetes mellitus without complications (HCC) 05/27/2018   Primary hypothyroidism 11/30/2015   Thyroid nodule 11/30/2015   Vitamin D deficiency 11/30/2015   Personal history of colonic polyps    Benign neoplasm of ascending colon    Benign neoplasm of transverse colon    Benign neoplasm of descending colon    Senile nuclear sclerosis 04/24/2014   Chronic postoperative pain 12/02/2012   Disturbance of skin sensation 12/02/2012   Myalgia and myositis, unspecified 12/02/2012   PCP:  Marylen Ponto, MD Pharmacy:   [facility] [id number] [place], [place] - [address] [place] [address] Phone: [phone] Fax: [phone]     Social Determinants of Health (SDOH) Interventions    Readmission Risk Interventions    11/29/2021    4:39 PM  Readmission Risk Prevention Plan  Transportation Screening Complete  PCP or Specialist Appt within 5-7 Days Complete  Home Care Screening Complete  Medication Review (RN CM) Complete

## 2021-11-29 NOTE — Progress Notes (Addendum)
      [address]       [place] [address]             [phone]        5 Days Post-Op Procedure(s) (LRB): AORTIC VALVE REPLACEMENT (AVR) USING INSPIRIS RESILIA  AORTIC VALVE (N/A) TRANSESOPHAGEAL ECHOCARDIOGRAM (TEE) (N/A)  Subjective: Patient "gassy" this am. She walked 3 times yesterday  Objective: Vital signs in last 24 hours: Temp:  [97.6 F (36.4 C)-99.1 F (37.3 C)] 98.6 F (37 C) (06/27 0413) Pulse Rate:  [66-104] 76 (06/27 0413) Cardiac Rhythm: Normal sinus rhythm (06/26 2130) Resp:  [15-21] 17 (06/27 0413) BP: (93-134)/(49-81) 117/57 (06/27 0413) SpO2:  [95 %-100 %] 98 % (06/27 0413) Weight:  [151.6 kg] 151.6 kg (06/27 0541)  Pre op weight  143.8 kg Current Weight  11/29/21 (!) 151.6 kg       Intake/Output from previous day: 06/26 0701 - 06/27 0700 In: -  Out: 1600 [Urine:1600]   Physical Exam:  Cardiovascular: RRR, flow murmur Pulmonary: Clear to auscultation on the right and slightly diminished on the left Abdomen: Soft, non tender, bowel sounds present. Extremities: Mild bilateral lower extremity edema. Wounds: Clean and dry.  No erythema or signs of infection.  Lab Results: CBC: Recent Labs    11/28/21 0308 11/29/21 0210  WBC 7.9 6.9  HGB 9.8* 10.1*  HCT 29.0* 29.5*  PLT 68* 77*    BMET:  Recent Labs    11/28/21 0308 11/29/21 0210  NA 139 140  K 4.1 4.0  CL 108 109  CO2 24 23  GLUCOSE 103* 108*  BUN 33* 27*  CREATININE 1.17* 1.01*  CALCIUM 8.7* 8.8*     PT/INR:  Lab Results  Component Value Date   INR 1.5 (H) 11/24/2021   INR 1.2 11/22/2021   ABG:  INR: Will add last result for INR, ABG once components are confirmed Will add last 4 CBG results once components are confirmed  Assessment/Plan:  1. CV - HR up into 120's briefly earlier this am. On Lopressor 12.5 mg bid and Midodrine 5 mg bid. Will increase Lopressor. Hope to stop Midodrine at discharge. 2.  Pulmonary - History of OSA-uses  CPAP. On room air. Encourage incentive spirometer 3. Volume Overload - Continue Lasix 4.  Expected post op acute blood loss anemia - H and H this am stable at 10.1 and 29.5 5. History of ITP-platelets this am increased to 77,000. Platelets 78,000 prior to surgery. Not on Lovenox or ec asa 6. Creatinine decreased from 1.17 to 1.01 7. History of hypothyroidism-continue Levothyroxine  8. Will  remove EPW 9. Will discuss disposition with surgeon  Lelon Huh ZimmermanPA-C 7:42 AM    Agree with above Continue PT Dispo planning  [place] Skalicky Keane Scrape

## 2021-11-29 NOTE — Care Management Important Message (Signed)
Important Message  Patient Details  Name: Joanna Alvarado MRN: [id number] Date of Birth: 1956/04/19   Medicare Important Message Given:  Yes     Renie Ora 11/29/2021, 9:10 AM

## 2021-11-29 NOTE — Progress Notes (Signed)
Epicardial wires removed. All ends intact. Pt tolerated well. Placed on bedrest for one hour. Will monitor.  Brooke Pace, RN

## 2021-11-29 NOTE — Plan of Care (Signed)
  Problem: Education: Goal: Understanding of CV disease, CV risk reduction, and recovery process will improve Outcome: Progressing Goal: Individualized Educational Video(s) Outcome: Progressing   Problem: Activity: Goal: Ability to return to baseline activity level will improve Outcome: Progressing   Problem: Cardiovascular: Goal: Ability to achieve and maintain adequate cardiovascular perfusion will improve Outcome: Progressing Goal: Vascular access site(s) Level 0-1 will be maintained Outcome: Progressing   Problem: Health Behavior/Discharge Planning: Goal: Ability to safely manage health-related needs after discharge will improve Outcome: Progressing   Problem: Education: Goal: Knowledge of General Education information will improve Description: Including pain rating scale, medication(s)/side effects and non-pharmacologic comfort measures Outcome: Progressing   Problem: Health Behavior/Discharge Planning: Goal: Ability to manage health-related needs will improve Outcome: Progressing   Problem: Clinical Measurements: Goal: Ability to maintain clinical measurements within normal limits will improve Outcome: Progressing Goal: Will remain free from infection Outcome: Progressing Goal: Diagnostic test results will improve Outcome: Progressing Goal: Respiratory complications will improve Outcome: Progressing Goal: Cardiovascular complication will be avoided Outcome: Progressing   Problem: Activity: Goal: Risk for activity intolerance will decrease Outcome: Progressing   Problem: Nutrition: Goal: Adequate nutrition will be maintained Outcome: Progressing   Problem: Coping: Goal: Level of anxiety will decrease Outcome: Progressing   Problem: Elimination: Goal: Will not experience complications related to bowel motility Outcome: Progressing Goal: Will not experience complications related to urinary retention Outcome: Progressing   Problem: Pain Managment: Goal:  General experience of comfort will improve Outcome: Progressing   Problem: Safety: Goal: Ability to remain free from injury will improve Outcome: Progressing   Problem: Skin Integrity: Goal: Risk for impaired skin integrity will decrease Outcome: Progressing   Problem: Education: Goal: Will demonstrate proper wound care and an understanding of methods to prevent future damage Outcome: Progressing Goal: Knowledge of disease or condition will improve Outcome: Progressing Goal: Knowledge of the prescribed therapeutic regimen will improve Outcome: Progressing Goal: Individualized Educational Video(s) Outcome: Progressing   Problem: Activity: Goal: Risk for activity intolerance will decrease Outcome: Progressing   Problem: Cardiac: Goal: Will achieve and/or maintain hemodynamic stability Outcome: Progressing   Problem: Clinical Measurements: Goal: Postoperative complications will be avoided or minimized Outcome: Progressing   Problem: Respiratory: Goal: Respiratory status will improve Outcome: Progressing   Problem: Skin Integrity: Goal: Wound healing without signs and symptoms of infection Outcome: Progressing Goal: Risk for impaired skin integrity will decrease Outcome: Progressing   Problem: Urinary Elimination: Goal: Ability to achieve and maintain adequate renal perfusion and functioning will improve Outcome: Progressing   

## 2021-11-30 ENCOUNTER — Inpatient Hospital Stay (HOSPITAL_COMMUNITY): Payer: HMO

## 2021-11-30 MED ORDER — TRAMADOL HCL 50 MG PO TABS: 50.0000 mg | ORAL_TABLET | ORAL | 0 refills | Status: DC | PRN

## 2021-11-30 MED ORDER — FUROSEMIDE 40 MG PO TABS: 40.0000 mg | ORAL_TABLET | Freq: Every day | ORAL | Status: DC

## 2021-11-30 MED ORDER — FUROSEMIDE 40 MG PO TABS: 40.0000 mg | ORAL_TABLET | Freq: Every day | ORAL | 0 refills | Status: DC

## 2021-11-30 MED ORDER — POTASSIUM CHLORIDE CRYS ER 20 MEQ PO TBCR: 20.0000 meq | EXTENDED_RELEASE_TABLET | Freq: Every day | ORAL | 0 refills | Status: DC

## 2021-11-30 MED ORDER — DICLOFENAC POTASSIUM 50 MG PO TABS: 50.0000 mg | ORAL_TABLET | Freq: Every day | ORAL | Status: DC

## 2021-11-30 MED ORDER — METOPROLOL TARTRATE 25 MG PO TABS: 25.0000 mg | ORAL_TABLET | Freq: Two times a day (BID) | ORAL | 1 refills | Status: DC

## 2021-11-30 MED ORDER — FUROSEMIDE 10 MG/ML IJ SOLN
40.0000 mg | Freq: Once | INTRAMUSCULAR | Status: AC
  Administered 2021-11-30: 40 mg via INTRAVENOUS
  Filled 2021-11-30: qty 4

## 2021-11-30 MED FILL — Potassium Chloride Inj 2 mEq/ML: INTRAVENOUS | Qty: 20 | Status: AC

## 2021-11-30 MED FILL — Heparin Sodium (Porcine) Inj 1000 Unit/ML: Qty: 1000 | Status: AC

## 2021-11-30 MED FILL — Magnesium Sulfate Inj 50%: INTRAMUSCULAR | Qty: 10 | Status: AC

## 2021-11-30 NOTE — Progress Notes (Signed)
Occupational Therapy Treatment Patient Details Name: Joanna Alvarado MRN: [id number] DOB: [birth year] Today's Date: 11/30/2021   History of present illness 66 yo female admitted 6/22 for AVR. PMHx: HTN, pulmonary HTN, T2DM, bil knee OA, fibromyalgia.   OT comments  Patient received in recliner and states she has been taking self to bathroom and has performed grooming tasks at sink this morning. Patient declined addressing LB dressing with sock aide stating she uses crocs at home. Patient performed mobility in hallway and used bathroom upon return and stood at sink for hand hygiene. Patient complained on RUE shoulder pain and stiffness. Patient instructed in ROM exercises and light stretching to address. Patient stated relief following. Acute OT to continue to follow.    Recommendations for follow up therapy are one component of a multi-disciplinary discharge planning process, led by the attending physician.  Recommendations may be updated based on patient status, additional functional criteria and insurance authorization.    Follow Up Recommendations  Home health OT    Assistance Recommended at Discharge Intermittent Supervision/Assistance  Patient can return home with the following  A little help with walking and/or transfers;Assist for transportation;Assistance with cooking/housework;A little help with bathing/dressing/bathroom   Equipment Recommendations  None recommended by OT    Recommendations for Other Services      Precautions / Restrictions Precautions Precautions: Sternal;Fall Precaution Booklet Issued: Yes (comment) Restrictions Weight Bearing Restrictions: Yes RUE Weight Bearing: Partial weight bearing LUE Weight Bearing: Partial weight bearing Other Position/Activity Restrictions: In the tube       Mobility Bed Mobility Overal bed mobility: Needs Assistance             General bed mobility comments: in recliner upon arrival    Transfers Overall transfer  level: Needs assistance Equipment used: Rolling walker (2 wheels) Transfers: Sit to/from Stand Sit to Stand: Min guard           General transfer comment: min guard to stand from recliner     Balance Overall balance assessment: Needs assistance Sitting-balance support: Feet supported, No upper extremity supported Sitting balance-Leahy Scale: Fair     Standing balance support: During functional activity Standing balance-Leahy Scale: Poor Standing balance comment: able to stand at sink for hand hygiene without support                           ADL either performed or assessed with clinical judgement   ADL Overall ADL's : Needs assistance/impaired     Grooming: Wash/dry hands;Wash/dry face;Supervision/safety;Standing Grooming Details (indicate cue type and reason): at sink                 Toilet Transfer: Supervision/safety;Rolling walker (2 wheels) Toilet Transfer Details (indicate cue type and reason): supervision for safety Toileting- Clothing Manipulation and Hygiene: Modified independent;Sitting/lateral lean Toileting - Clothing Manipulation Details (indicate cue type and reason): no physical assistance needed for toilet hygiene       General ADL Comments: supervision for self care tasks other than LB dressing for donning socks which patient declined to address stating she wears crocs at home    Extremity/Trunk Assessment              Vision       Perception     Praxis      Cognition Arousal/Alertness: Awake/alert Behavior During Therapy: WFL for tasks assessed/performed Overall Cognitive Status: Impaired/Different from baseline Area of Impairment: Memory  Memory: Decreased recall of precautions                  Exercises Exercises: General Upper Extremity General Exercises - Upper Extremity Shoulder Flexion: AROM, Right, 10 reps Shoulder ABduction: AROM, Right, 10 reps    Shoulder Instructions        General Comments      Pertinent Vitals/ Pain       Pain Assessment Pain Assessment: Faces Faces Pain Scale: Hurts a little bit Pain Location: sternum right shoulder Pain Descriptors / Indicators: Sore, Operative site guarding Pain Intervention(s): Monitored during session, Repositioned  Home Living                                          Prior Functioning/Environment              Frequency  Min 2X/week        Progress Toward Goals  OT Goals(current goals can now be found in the care plan section)  Progress towards OT goals: Progressing toward goals  Acute Rehab OT Goals Patient Stated Goal: get better OT Goal Formulation: With patient Time For Goal Achievement: 12/10/21 Potential to Achieve Goals: Good ADL Goals Pt Will Perform Grooming: with set-up;standing Pt Will Perform Upper Body Dressing: with supervision;sitting Pt Will Perform Lower Body Dressing: with min assist;sit to/from stand;with adaptive equipment Pt Will Transfer to Toilet: with modified independence;regular height toilet;ambulating Pt/caregiver will Perform Home Exercise Program: Increased ROM;Both right and left upper extremity;With Supervision;With written HEP provided  Plan Discharge plan remains appropriate    Co-evaluation                 AM-PAC OT "6 Clicks" Daily Activity     Outcome Measure   Help from another person eating meals?: None Help from another person taking care of personal grooming?: None Help from another person toileting, which includes using toliet, bedpan, or urinal?: A Little Help from another person bathing (including washing, rinsing, drying)?: A Little Help from another person to put on and taking off regular upper body clothing?: A Little Help from another person to put on and taking off regular lower body clothing?: A Lot 6 Click Score: 19    End of Session Equipment Utilized During Treatment: Rolling walker (2 wheels)  OT  Visit Diagnosis: Unsteadiness on feet (R26.81);Muscle weakness (generalized) (M62.81)   Activity Tolerance Patient tolerated treatment well   Patient Left in chair;with call bell/phone within reach;with family/visitor present   Nurse Communication Mobility status        Time: 4268-3419 OT Time Calculation (min): 23 min  Charges: OT General Charges $OT Visit: 1 Visit OT Treatments $Self Care/Home Management : 8-22 mins $Therapeutic Exercise: 8-22 mins  Lodema Hong, OTA Acute Rehabilitation Services  Office [phone]   Trixie Dredge 11/30/2021, 12:56 PM

## 2021-11-30 NOTE — TOC Transition Note (Signed)
Transition of Care (TOC) - CM/SW Discharge Note Marvetta Gibbons RN, BSN Transitions of Care Unit 4E- RN Case Manager See Treatment Team for direct phone #    Patient Details  Name: Joanna Alvarado MRN: [id number] Date of Birth: [birth year]  Transition of Care [facility]) CM/SW Contact:  Dawayne Patricia, RN Phone Number: 11/30/2021, 11:38 AM   Clinical Narrative:    Cm follow up on HHPT/OT, call made back to [facility] this am- spoke with Arbie Cookey- after checking on staffing, etc.- [facility] unable to service at this time- do not staff pt's area currently.  [place] Call made to [facility]- spoke with Tommi Rumps- they are able to accept for needed services- PTOT- will contact pt post discharge to schedule start of care.   Plan for possible d/c later this afternoon, family to transport home. No further TOC needs noted   Final next level of care: [place] Barriers to Discharge: Barriers Resolved   Patient Goals and CMS Choice Patient states their goals for this hospitalization and ongoing recovery are:: return home CMS Medicare.gov Compare Post Acute Care list provided to:: Patient Choice offered to / list presented to : Patient  Discharge Placement                 Home w/ [facility]      Discharge Plan and Services   Discharge Planning Services: CM Consult Post Acute Care Choice: Durable Medical Equipment, Home Health          DME Arranged: 3-N-1 DME Agency: [employer] Date DME Agency Contacted: 11/29/21 Time DME Agency Contacted: 1500 Representative spoke with at DME Agency: Jodell Cipro HH Arranged: PT, OT [place] Agency: [place] Date [place]: 11/30/21 Time [place]: 53 Representative spoke with at [place]: [place] ([place]) Interventions     Readmission Risk Interventions    11/29/2021    4:39 PM  Readmission Risk Prevention Plan  Transportation Screening Complete  PCP or Specialist Appt within  5-7 Days Complete  Home Care Screening Complete  Medication Review (RN CM) Complete

## 2021-11-30 NOTE — Progress Notes (Signed)
CT sutures removed. Pt tolerated well.  Raelyn Number, RN

## 2021-11-30 NOTE — Progress Notes (Signed)
Physical Therapy Treatment Patient Details Name: Joanna Alvarado MRN: [id number] DOB: 09/03/1955 Today's Date: 11/30/2021   History of Present Illness 66 yo female admitted 6/22 for AVR. PMHx: HTN, pulmonary HTN, T2DM, bil knee OA, fibromyalgia.    PT Comments    Pt received OOB on arrival and agreeable to session with continued progress towards goals. Pt able to demonstrate ambulation with min guard throughout with no LOB, however continue DOE with SpO2 maintaining >91% throughout. Pt continues to require cues to maintain movement in tube and adhere to all sternal precautions. Educated pt re; pacing, activity recommendations and benefits of continued mobility with pt verbalizing understanding. Pt continues to benefit from skilled PT services to progress toward functional mobility goals.    Recommendations for follow up therapy are one component of a multi-disciplinary discharge planning process, led by the attending physician.  Recommendations may be updated based on patient status, additional functional criteria and insurance authorization.  Follow Up Recommendations  Home health PT     Assistance Recommended at Discharge Intermittent Supervision/Assistance  Patient can return home with the following A little help with walking and/or transfers;A little help with bathing/dressing/bathroom;Assistance with cooking/housework;Assist for transportation;Help with stairs or ramp for entrance   Equipment Recommendations  BSC/3in1    Recommendations for Other Services       Precautions / Restrictions Precautions Precautions: Sternal;Fall Precaution Booklet Issued: Yes (comment) Restrictions Weight Bearing Restrictions: Yes RUE Weight Bearing: Partial weight bearing LUE Weight Bearing: Partial weight bearing Other Position/Activity Restrictions: In the tube     Mobility  Bed Mobility Overal bed mobility: Needs Assistance             General bed mobility comments: pt OOB in  bathroom on arrival    Transfers Overall transfer level: Needs assistance Equipment used: Rolling walker (2 wheels) Transfers: Sit to/from Stand Sit to Stand: Min guard           General transfer comment: Able to use momentum to stand from low commode with exaggerated momentum/rocking method with a few attempts, cues to refrain from using UEs to push.    Ambulation/Gait Ambulation/Gait assistance: Min guard Gait Distance (Feet): 200 Feet Assistive device: Rolling walker (2 wheels) Gait Pattern/deviations: Step-through pattern, Decreased stride length, Trunk flexed       General Gait Details: Slow, mostly steady gait with x1 standing rest breaks, 2-3/4 DOE. Cues for energy conservation/activity pacing. HR 100bpm max during activity, SpO2 91-100%   Stairs   Stairs assistance: Min assist Stair Management: Step to pattern, One rail Right, Forwards Number of Stairs: 4 General stair comments: Cues for safety, and keeping movement in tube   Wheelchair Mobility    Modified Rankin (Stroke Patients Only)       Balance Overall balance assessment: Needs assistance Sitting-balance support: Feet supported, No upper extremity supported Sitting balance-Leahy Scale: Fair     Standing balance support: During functional activity Standing balance-Leahy Scale: Poor Standing balance comment: Requires UE support for dynamic tasks, able to walk a short distance without UE support                            Cognition Arousal/Alertness: Awake/alert Behavior During Therapy: WFL for tasks assessed/performed Overall Cognitive Status: Impaired/Different from baseline Area of Impairment: Memory                     Memory: Decreased recall of precautions  General Comments: reviewed precautions and how they relate to functional mobility. Needs cues to adhere to them during mobility.        Exercises      General Comments        Pertinent  Vitals/Pain Pain Assessment Pain Assessment: Faces Faces Pain Scale: Hurts a little bit Pain Location: sternum Pain Descriptors / Indicators: Sore, Operative site guarding Pain Intervention(s): Monitored during session, Limited activity within patient's tolerance    Home Living                          Prior Function            PT Goals (current goals can now be found in the care plan section) Acute Rehab PT Goals Patient Stated Goal: return home PT Goal Formulation: With patient/family Time For Goal Achievement: 12/09/21    Frequency    Min 3X/week      PT Plan Current plan remains appropriate    Co-evaluation              AM-PAC PT "6 Clicks" Mobility   Outcome Measure  Help needed turning from your back to your side while in a flat bed without using bedrails?: A Little Help needed moving from lying on your back to sitting on the side of a flat bed without using bedrails?: A Little Help needed moving to and from a bed to a chair (including a wheelchair)?: A Little Help needed standing up from a chair using your arms (e.g., wheelchair or bedside chair)?: A Little Help needed to walk in hospital room?: A Little Help needed climbing 3-5 steps with a railing? : A Little 6 Click Score: 18    End of Session   Activity Tolerance: Patient tolerated treatment well Patient left: with call bell/phone within reach;with family/visitor present;in chair Nurse Communication: Mobility status PT Visit Diagnosis: Other abnormalities of gait and mobility (R26.89);Difficulty in walking, not elsewhere classified (R26.2)     Time: 2536-6440 PT Time Calculation (min) (ACUTE ONLY): 25 min  Charges:  $Gait Training: 8-22 mins $Therapeutic Activity: 8-22 mins                     Emnet Monk R. PTA Acute Rehabilitation Services Office: [place] 11/30/2021, 10:05 AM

## 2021-11-30 NOTE — Plan of Care (Signed)
  Problem: Education: Goal: Understanding of CV disease, CV risk reduction, and recovery process will improve Outcome: Progressing Goal: Individualized Educational Video(s) Outcome: Progressing   Problem: Activity: Goal: Ability to return to baseline activity level will improve Outcome: Progressing   Problem: Cardiovascular: Goal: Ability to achieve and maintain adequate cardiovascular perfusion will improve Outcome: Progressing Goal: Vascular access site(s) Level 0-1 will be maintained Outcome: Progressing   Problem: Health Behavior/Discharge Planning: Goal: Ability to safely manage health-related needs after discharge will improve Outcome: Progressing   Problem: Education: Goal: Knowledge of General Education information will improve Description: Including pain rating scale, medication(s)/side effects and non-pharmacologic comfort measures Outcome: Progressing   Problem: Health Behavior/Discharge Planning: Goal: Ability to manage health-related needs will improve Outcome: Progressing   Problem: Clinical Measurements: Goal: Ability to maintain clinical measurements within normal limits will improve Outcome: Progressing Goal: Will remain free from infection Outcome: Progressing Goal: Diagnostic test results will improve Outcome: Progressing Goal: Respiratory complications will improve Outcome: Progressing Goal: Cardiovascular complication will be avoided Outcome: Progressing   Problem: Activity: Goal: Risk for activity intolerance will decrease Outcome: Progressing   Problem: Nutrition: Goal: Adequate nutrition will be maintained Outcome: Progressing   Problem: Coping: Goal: Level of anxiety will decrease Outcome: Progressing   Problem: Elimination: Goal: Will not experience complications related to bowel motility Outcome: Progressing Goal: Will not experience complications related to urinary retention Outcome: Progressing   Problem: Pain Managment: Goal:  General experience of comfort will improve Outcome: Progressing   Problem: Safety: Goal: Ability to remain free from injury will improve Outcome: Progressing   Problem: Skin Integrity: Goal: Risk for impaired skin integrity will decrease Outcome: Progressing   Problem: Education: Goal: Will demonstrate proper wound care and an understanding of methods to prevent future damage Outcome: Progressing Goal: Knowledge of disease or condition will improve Outcome: Progressing Goal: Knowledge of the prescribed therapeutic regimen will improve Outcome: Progressing Goal: Individualized Educational Video(s) Outcome: Progressing   Problem: Activity: Goal: Risk for activity intolerance will decrease Outcome: Progressing   Problem: Cardiac: Goal: Will achieve and/or maintain hemodynamic stability Outcome: Progressing   Problem: Clinical Measurements: Goal: Postoperative complications will be avoided or minimized Outcome: Progressing   Problem: Respiratory: Goal: Respiratory status will improve Outcome: Progressing   Problem: Skin Integrity: Goal: Wound healing without signs and symptoms of infection Outcome: Progressing Goal: Risk for impaired skin integrity will decrease Outcome: Progressing   Problem: Urinary Elimination: Goal: Ability to achieve and maintain adequate renal perfusion and functioning will improve Outcome: Progressing   

## 2021-11-30 NOTE — Progress Notes (Signed)
CARDIAC REHAB PHASE I   D/c education completed with pt and family. Pt educated on importance of site care and monitoring incisions daily. Encouraged continued Is use, walks, and sternal precautions. Pt given in-the-tube sheet along with heart healthy and diabetic diets. Reviewed restrictions and exercise guidelines. Encouraged daily weights. Pt states necessary DME at home with good family support. Will refer to CRP II [place].  [id number] Rufina Falco, RN BSN 11/30/2021 1:34 PM

## 2021-11-30 NOTE — Progress Notes (Signed)
      [address]       [place] [address]             [phone]        6 Days Post-Op Procedure(s) (LRB): AORTIC VALVE REPLACEMENT (AVR) USING 25MM INSPIRIS RESILIA  AORTIC VALVE (N/A) TRANSESOPHAGEAL ECHOCARDIOGRAM (TEE) (N/A)  Subjective: Patient just waking up. She has no complaints. She hopes to go home today  Objective: Vital signs in last 24 hours: Temp:  [97.9 F (36.6 C)-98.7 F (37.1 C)] 98.5 F (36.9 C) (06/28 0436) Pulse Rate:  [63-87] 81 (06/28 0436) Cardiac Rhythm: Normal sinus rhythm (06/27 1930) Resp:  [16-20] 20 (06/28 0436) BP: (118-141)/(43-64) 122/43 (06/28 0436) SpO2:  [95 %-100 %] 96 % (06/28 0436) FiO2 (%):  [21 %] 21 % (06/27 2150) Weight:  [150 kg] 150 kg (06/28 0436)  Pre op weight  143.8 kg Current Weight  11/30/21 (!) 150 kg       Intake/Output from previous day: 06/27 0701 - 06/28 0700 In: 500 [P.O.:500] Out: 850 [Urine:850]   Physical Exam:  Cardiovascular: RRR, flow murmur Pulmonary: Clear to auscultation on the right and slightly diminished on the left Abdomen: Soft, non tender, bowel sounds present. Extremities: Mild bilateral lower extremity edema. Wounds: Clean and dry.  No erythema or signs of infection.  Lab Results: CBC: Recent Labs    11/28/21 0308 11/29/21 0210  WBC 7.9 6.9  HGB 9.8* 10.1*  HCT 29.0* 29.5*  PLT 68* 77*    BMET:  Recent Labs    11/28/21 0308 11/29/21 0210  NA 139 140  K 4.1 4.0  CL 108 109  CO2 24 23  GLUCOSE 103* 108*  BUN 33* 27*  CREATININE 1.17* 1.01*  CALCIUM 8.7* 8.8*     PT/INR:  Lab Results  Component Value Date   INR 1.5 (H) 11/24/2021   INR 1.2 11/22/2021   ABG:  INR: Will add last result for INR, ABG once components are confirmed Will add last 4 CBG results once components are confirmed  Assessment/Plan:  1. CV - HR up into the 110's yesterday and given IV Lopressor. On monitor, has not had increased heart rate. On Lopressor 25 mg bid and  Midodrine 5 mg bid. Stop Midodrine 2.  Pulmonary - History of OSA-uses CPAP. On room air. Encourage incentive spirometer 3. Volume Overload - Continue Lasix 4.  Expected post op acute blood loss anemia - H and H this am stable at 10.1 and 29.5 5. History of ITP-Last platelets increased to 77,000. Platelets 78,000 prior to surgery. Not on Lovenox or ec asa 6. History of hypothyroidism-continue Levothyroxine  7. Hope to discharge later today  Sharalyn Ink [facility] 7:33 AM

## 2021-12-02 ENCOUNTER — Telehealth (HOSPITAL_COMMUNITY): Payer: Self-pay

## 2021-12-02 NOTE — Telephone Encounter (Signed)
Per phase I cardiac rehab, fax cardiac rehab referral to [facility]. °

## 2021-12-03 ENCOUNTER — Other Ambulatory Visit: Payer: Self-pay | Admitting: Physician Assistant

## 2021-12-05 DIAGNOSIS — E669 Obesity, unspecified: Secondary | ICD-10-CM | POA: Diagnosis not present

## 2021-12-05 DIAGNOSIS — Z953 Presence of xenogenic heart valve: Secondary | ICD-10-CM | POA: Diagnosis not present

## 2021-12-05 DIAGNOSIS — M170 Bilateral primary osteoarthritis of knee: Secondary | ICD-10-CM | POA: Diagnosis not present

## 2021-12-05 DIAGNOSIS — I4891 Unspecified atrial fibrillation: Secondary | ICD-10-CM | POA: Diagnosis not present

## 2021-12-05 DIAGNOSIS — E119 Type 2 diabetes mellitus without complications: Secondary | ICD-10-CM | POA: Diagnosis not present

## 2021-12-05 DIAGNOSIS — E039 Hypothyroidism, unspecified: Secondary | ICD-10-CM | POA: Diagnosis not present

## 2021-12-05 DIAGNOSIS — J9811 Atelectasis: Secondary | ICD-10-CM | POA: Diagnosis not present

## 2021-12-05 DIAGNOSIS — I119 Hypertensive heart disease without heart failure: Secondary | ICD-10-CM | POA: Diagnosis not present

## 2021-12-05 DIAGNOSIS — M797 Fibromyalgia: Secondary | ICD-10-CM | POA: Diagnosis not present

## 2021-12-05 DIAGNOSIS — I272 Pulmonary hypertension, unspecified: Secondary | ICD-10-CM | POA: Diagnosis not present

## 2021-12-05 DIAGNOSIS — E559 Vitamin D deficiency, unspecified: Secondary | ICD-10-CM | POA: Diagnosis not present

## 2021-12-05 DIAGNOSIS — K219 Gastro-esophageal reflux disease without esophagitis: Secondary | ICD-10-CM | POA: Diagnosis not present

## 2021-12-05 DIAGNOSIS — D649 Anemia, unspecified: Secondary | ICD-10-CM | POA: Diagnosis not present

## 2021-12-05 DIAGNOSIS — Z86010 Personal history of colonic polyps: Secondary | ICD-10-CM | POA: Diagnosis not present

## 2021-12-05 DIAGNOSIS — Z48812 Encounter for surgical aftercare following surgery on the circulatory system: Secondary | ICD-10-CM | POA: Diagnosis not present

## 2021-12-05 DIAGNOSIS — Z9181 History of falling: Secondary | ICD-10-CM | POA: Diagnosis not present

## 2021-12-05 DIAGNOSIS — Z6841 Body Mass Index (BMI) 40.0 and over, adult: Secondary | ICD-10-CM | POA: Diagnosis not present

## 2021-12-08 ENCOUNTER — Telehealth: Payer: Self-pay

## 2021-12-08 NOTE — Telephone Encounter (Signed)
Patient called and stated that when she was discharged from the hospital. When patient went to pick up both potassium and Metoprolol from the pharmacy, they had made a mistake and had given her a bottle of potassium for her to take for 5 days and and what was supposed to be metoprolol '25mg'$  BID, but was mistakenly given potassium in the bottle, so since her discharge she has been taking 3 potassium tablets since her discharge and starting

## 2021-12-09 ENCOUNTER — Other Ambulatory Visit: Payer: Self-pay

## 2021-12-09 DIAGNOSIS — I10 Essential (primary) hypertension: Secondary | ICD-10-CM

## 2021-12-09 DIAGNOSIS — I351 Nonrheumatic aortic (valve) insufficiency: Secondary | ICD-10-CM | POA: Diagnosis not present

## 2021-12-12 ENCOUNTER — Telehealth: Payer: Self-pay

## 2021-12-12 NOTE — Telephone Encounter (Signed)
Pt called and stated that a few weeks ago the drug store messed up and accidentally gave me potassium instead of the metoprolol. So she was out of her metoprolol for around 10 days. She has started back on it, however she would like you to review her labs and wanted to make sure you knew this may be what the labs are reflecting.

## 2021-12-12 NOTE — Telephone Encounter (Signed)
Patient contacted the office to state that she had a pharmacy mix-up and potassium pills were placed into the metoprolol bottle that she was given once discharged from the hospital. She states that she has been taking 3 potassium pills and was concerned that her levels were too high. Advised patient to go to a [facility] for potassium lab draw, but patient states that she does not have one in her vicinity and it is close to 5p. Advised next to then contact her PCP's office to see if they would be able to check her labs. She acknowledged receipt.  Patient did get the correct medication from her pharmacy of metoprolol and advised to start taking it. She acknowledged receipt.   In the meantime, advised to go to nearest ER if she started to have any palpitations, severe shortness of breath, or any other concerning symptoms. She acknowledged receipt.

## 2021-12-13 ENCOUNTER — Other Ambulatory Visit: Payer: Self-pay | Admitting: Cardiothoracic Surgery

## 2021-12-13 DIAGNOSIS — Z953 Presence of xenogenic heart valve: Secondary | ICD-10-CM

## 2021-12-19 DIAGNOSIS — G4733 Obstructive sleep apnea (adult) (pediatric): Secondary | ICD-10-CM | POA: Diagnosis not present

## 2021-12-19 DIAGNOSIS — I10 Essential (primary) hypertension: Secondary | ICD-10-CM | POA: Diagnosis not present

## 2021-12-20 ENCOUNTER — Ambulatory Visit: Payer: HMO | Admitting: Cardiology

## 2021-12-21 DIAGNOSIS — Z48812 Encounter for surgical aftercare following surgery on the circulatory system: Secondary | ICD-10-CM | POA: Diagnosis not present

## 2021-12-21 DIAGNOSIS — K219 Gastro-esophageal reflux disease without esophagitis: Secondary | ICD-10-CM | POA: Diagnosis not present

## 2021-12-21 DIAGNOSIS — I119 Hypertensive heart disease without heart failure: Secondary | ICD-10-CM | POA: Diagnosis not present

## 2021-12-21 DIAGNOSIS — Z953 Presence of xenogenic heart valve: Secondary | ICD-10-CM | POA: Diagnosis not present

## 2021-12-21 DIAGNOSIS — Z86010 Personal history of colonic polyps: Secondary | ICD-10-CM | POA: Diagnosis not present

## 2021-12-21 DIAGNOSIS — E559 Vitamin D deficiency, unspecified: Secondary | ICD-10-CM | POA: Diagnosis not present

## 2021-12-21 DIAGNOSIS — M797 Fibromyalgia: Secondary | ICD-10-CM | POA: Diagnosis not present

## 2021-12-21 DIAGNOSIS — M170 Bilateral primary osteoarthritis of knee: Secondary | ICD-10-CM | POA: Diagnosis not present

## 2021-12-21 DIAGNOSIS — D649 Anemia, unspecified: Secondary | ICD-10-CM | POA: Diagnosis not present

## 2021-12-21 DIAGNOSIS — E039 Hypothyroidism, unspecified: Secondary | ICD-10-CM | POA: Diagnosis not present

## 2021-12-21 DIAGNOSIS — E119 Type 2 diabetes mellitus without complications: Secondary | ICD-10-CM | POA: Diagnosis not present

## 2021-12-21 DIAGNOSIS — E669 Obesity, unspecified: Secondary | ICD-10-CM | POA: Diagnosis not present

## 2021-12-21 DIAGNOSIS — J9811 Atelectasis: Secondary | ICD-10-CM | POA: Diagnosis not present

## 2021-12-21 DIAGNOSIS — I272 Pulmonary hypertension, unspecified: Secondary | ICD-10-CM | POA: Diagnosis not present

## 2021-12-21 DIAGNOSIS — I4891 Unspecified atrial fibrillation: Secondary | ICD-10-CM | POA: Diagnosis not present

## 2021-12-21 DIAGNOSIS — Z6841 Body Mass Index (BMI) 40.0 and over, adult: Secondary | ICD-10-CM | POA: Diagnosis not present

## 2021-12-21 DIAGNOSIS — Z9181 History of falling: Secondary | ICD-10-CM | POA: Diagnosis not present

## 2021-12-26 ENCOUNTER — Ambulatory Visit
Admission: RE | Admit: 2021-12-26 | Discharge: 2021-12-26 | Disposition: A | Discharge status: Home / Self Care | Payer: PPO | Source: Ambulatory Visit | Attending: Cardiothoracic Surgery | Admitting: Cardiothoracic Surgery

## 2021-12-26 ENCOUNTER — Ambulatory Visit (HOSPITAL_COMMUNITY)
Admission: RE | Admit: 2021-12-26 | Discharge: 2021-12-26 | Disposition: A | Discharge status: Home / Self Care | Payer: HMO | Source: Ambulatory Visit | Attending: Cardiothoracic Surgery | Admitting: Cardiothoracic Surgery

## 2021-12-26 ENCOUNTER — Other Ambulatory Visit: Payer: Self-pay | Admitting: Cardiothoracic Surgery

## 2021-12-26 ENCOUNTER — Ambulatory Visit (INDEPENDENT_AMBULATORY_CARE_PROVIDER_SITE_OTHER): Payer: Self-pay | Admitting: Physician Assistant

## 2021-12-26 VITALS — BP 136/80 | HR 59 | Resp 18 | Ht 68.0 in | Wt 325.0 lb

## 2021-12-26 DIAGNOSIS — Z953 Presence of xenogenic heart valve: Secondary | ICD-10-CM | POA: Diagnosis not present

## 2021-12-26 DIAGNOSIS — M79605 Pain in left leg: Secondary | ICD-10-CM

## 2021-12-26 DIAGNOSIS — R918 Other nonspecific abnormal finding of lung field: Secondary | ICD-10-CM | POA: Diagnosis not present

## 2021-12-26 DIAGNOSIS — J90 Pleural effusion, not elsewhere classified: Secondary | ICD-10-CM | POA: Diagnosis not present

## 2021-12-26 MED ORDER — POTASSIUM CHLORIDE CRYS ER 20 MEQ PO TBCR
20.0000 meq | EXTENDED_RELEASE_TABLET | Freq: Every day | ORAL | 1 refills | Status: DC
Start: 2021-12-26 — End: 2022-01-31

## 2021-12-26 MED ORDER — FUROSEMIDE 40 MG PO TABS: 40.0000 mg | ORAL_TABLET | Freq: Every day | ORAL | 1 refills | Status: DC

## 2021-12-26 NOTE — Patient Instructions (Signed)
You are encouraged to enroll and participate in the outpatient cardiac rehab program beginning as soon as practical.  Endocarditis is a potentially serious infection of heart valves or inside lining of the heart.  It occurs more commonly in patients with diseased heart valves (such as patient's with aortic or mitral valve disease) and in patients who have undergone heart valve repair or replacement.  Certain surgical and dental procedures may put you at risk, such as dental cleaning, other dental procedures, or any surgery involving the respiratory, urinary, gastrointestinal tract, gallbladder or prostate gland.   To minimize your chances for develooping endocarditis, maintain good oral health and seek prompt medical attention for any infections involving the mouth, teeth, gums, skin or urinary tract.    Always notify your doctor or dentist about your underlying heart valve condition before having any invasive procedures. You will need to take antibiotics before certain procedures, including all routine dental cleanings or other dental procedures.  Your cardiologist or dentist should prescribe these antibiotics for you to be taken ahead of time.   You may return to driving an automobile as long as you are no longer requiring oral narcotic pain relievers during the daytime.  It would be wise to start driving only short distances during the daylight and gradually increase from there as you feel comfortable.  Make every effort to maintain a "heart-healthy" lifestyle with regular physical exercise and adherence to a low-fat, low-carbohydrate diet.  Continue to seek regular follow-up appointments with your primary care physician and/or cardiologist.  You may continue to gradually increase your physical activity as tolerated.  Refrain from any heavy lifting or strenuous use of your arms and shoulders until at least 8 weeks from the time of your surgery, and avoid activities that cause increased pain in your  chest on the side of your surgical incision.  Otherwise you may continue to increase activities without any particular limitations.  Increase the intensity and duration of physical activity gradually.

## 2021-12-26 NOTE — Progress Notes (Signed)
[address]       [place] [address]             [phone]    HPI: Patient returns for routine postoperative follow-up having undergone AVR on 11/24/2021.  The patient's early postoperative recovery while in the hospital progressed without complication.  Since hospital discharge the patient reports that she is having issues with continued swelling in her right leg.  She states that everything from her right knee up is tight and painful.  She states her left leg has minimal swelling.  She states all the swelling subsided in her hands.  She states her weight is the same as hospital discharge and has been stable.  She also mentions that [facility] gave her potassium pills in place of Lopressor.  The patient states the error has been corrected and she has since started taking Lopressor as directed.  She denies pain, but does feel short of breath.  She states specifically the other day when therapy walked her outside she was almost in a pain because she was struggling to breath post walk.    Current Outpatient Medications  Medication Sig Dispense Refill   acetaminophen (TYLENOL) 500 MG tablet Take 1,000 mg by mouth every 6 (six) hours as needed for moderate pain.     budesonide-formoterol (SYMBICORT) 80-4.5 MCG/ACT inhaler Inhale 2 puffs into the lungs 2 (two) times daily as needed (shortness of breath).      diclofenac (CATAFLAM) 50 MG tablet Take 1 tablet (50 mg total) by mouth daily.     famotidine (PEPCID) 40 MG tablet Take 40 mg by mouth at bedtime.     furosemide (LASIX) 40 MG tablet Take 1 tablet (40 mg total) by mouth daily. For one week then stop. 7 tablet 0   levothyroxine (SYNTHROID, LEVOTHROID) 175 MCG tablet Take 175-262.5 mcg by mouth See admin instructions. Take 175 mcg by mouth daily, except on Tuesday take 262.5 mcg     lidocaine (LIDODERM) 5 % Place 1 patch onto the skin daily as needed (back, hip and knee pain). Remove & Discard patch within 12 hours or as  directed by MD     linaclotide (LINZESS) 290 MCG CAPS capsule Take 290 mcg by mouth daily before breakfast.     metoprolol tartrate (LOPRESSOR) 25 MG tablet Take 1 tablet (25 mg total) by mouth 2 (two) times daily. 60 tablet 1   nitroGLYCERIN (NITROSTAT) 0.4 MG SL tablet Place 0.4 mg under the tongue every 5 (five) minutes as needed for chest pain.     omeprazole (PRILOSEC) 40 MG capsule Take 40 mg by mouth daily.     polyethylene glycol (MIRALAX / GLYCOLAX) 17 g packet Take 17 g by mouth daily.     potassium chloride SA (KLOR-CON M) 20 MEQ tablet Take 1 tablet (20 mEq total) by mouth daily. For 7 days then stop. 7 tablet 0   rosuvastatin (CRESTOR) 5 MG tablet TAKE ONE TABLET BY MOUTH DAILY 90 tablet 3   traMADol (ULTRAM) 50 MG tablet Take 1 tablet (50 mg total) by mouth every 4 (four) hours as needed for moderate pain. 28 tablet 0   No current facility-administered medications for this visit.    Physical Exam:  BP 136/80 (BP Location: Right Arm, Patient Position: Sitting)   Pulse (!) 59   Resp 18   Ht '5\' 8"'$  (1.727 m)   Wt (!) 325 lb (147.4 kg)   SpO2 98% Comment: RA  BMI 49.42 kg/m   Gen: NAD, morbidly obese Heart: RRR Lungs: Diminished left base Abd: soft non-tender Ext: RLE is clearly larger than her left, there is no pitting edema present, there is tenderness along the back of her knee and thigh Incision: well healed  Diagnostic Tests:  CXR: sternal wires intact, left pleural effusion  A/P:  S/P AVR Left Pleural effusion- moderate in size, will have patient take lasix daily for the next 7 days and supplement with potassium RLE pain/swelling- this does not appear to be volume related.  Patient is ambulating but with morbid obesity in a post operative setting she would be at increased risk of DVT.Marland Kitchen will get duplex to R/O Cardiac Rehabilitation- you are cleared to participate if you wish to enroll Activity- increase ambulation as tolerated, okay to resume driving, observe  sternal precautions as discussed Follow up with Cardiologist as scheduled RTC in 1 week with repeat CXR... if no improvement of pleural effusion, will refer for Thoracentesis  [name], PA-C [facility] [phone]

## 2022-01-02 ENCOUNTER — Encounter: Payer: Self-pay | Admitting: Cardiology

## 2022-01-02 ENCOUNTER — Ambulatory Visit: Payer: HMO | Admitting: Cardiology

## 2022-01-02 ENCOUNTER — Other Ambulatory Visit: Payer: Self-pay | Admitting: Cardiothoracic Surgery

## 2022-01-02 VITALS — BP 135/80 | HR 67 | Temp 97.9°F | Resp 12 | Ht 68.0 in | Wt 314.5 lb

## 2022-01-02 DIAGNOSIS — I77810 Thoracic aortic ectasia: Secondary | ICD-10-CM | POA: Diagnosis not present

## 2022-01-02 DIAGNOSIS — I10 Essential (primary) hypertension: Secondary | ICD-10-CM

## 2022-01-02 DIAGNOSIS — Z953 Presence of xenogenic heart valve: Secondary | ICD-10-CM | POA: Diagnosis not present

## 2022-01-02 DIAGNOSIS — N1831 Chronic kidney disease, stage 3a: Secondary | ICD-10-CM

## 2022-01-02 DIAGNOSIS — J90 Pleural effusion, not elsewhere classified: Secondary | ICD-10-CM

## 2022-01-02 NOTE — Progress Notes (Unsigned)
[address]       [place] [address]             [phone]       HPI: This is a 66 year old female with a past medical history of hypertension, DM, ITP, OSA (uses CPAP), primary hypothyroidism, fibromyalgia who underwent an aortic valve replacement by Dr. Prescott Gum on 11/24/2021. She was discharged on 11/30/2021. She was seen in the office by  [name] on 12/26/2021. She was found to have a small to moderate left pleural effusion and had complaints of LE edema. Duplex venous US showed no DVT in the lower extremities. She presents today for follow up of the left pleural effusion.  Current Outpatient Medications  Medication Sig Dispense Refill   acetaminophen (TYLENOL) 500 MG tablet Take 1,000 mg by mouth every 6 (six) hours as needed for moderate pain.     diclofenac (CATAFLAM) 50 MG tablet Take 1 tablet (50 mg total) by mouth daily.     famotidine (PEPCID) 40 MG tablet Take 40 mg by mouth at bedtime.     furosemide (LASIX) 40 MG tablet Take 1 tablet (40 mg total) by mouth daily. For one week then stop. (Patient not taking: Reported on 12/26/2021) 7 tablet 0   furosemide (LASIX) 40 MG tablet Take 1 tablet (40 mg total) by mouth daily. 30 tablet 1   levothyroxine (SYNTHROID, LEVOTHROID) 175 MCG tablet Take 175-262.5 mcg by mouth See admin instructions. Take 175 mcg by mouth daily, except on Tuesday take 262.5 mcg     linaclotide (LINZESS) 290 MCG CAPS capsule Take 290 mcg by mouth daily before breakfast.     metoprolol tartrate (LOPRESSOR) 25 MG tablet Take 1 tablet (25 mg total) by mouth 2 (two) times daily. 60 tablet 1   nitroGLYCERIN (NITROSTAT) 0.4 MG SL tablet Place 0.4 mg under the tongue every 5 (five) minutes as needed for chest pain.     omeprazole (PRILOSEC) 40 MG capsule Take 40 mg by mouth daily.     polyethylene glycol (MIRALAX / GLYCOLAX) 17 g packet Take 17 g by mouth daily.     potassium chloride SA (KLOR-CON M) 20 MEQ tablet Take 1 tablet (20  mEq total) by mouth daily. 30 tablet 1   rosuvastatin (CRESTOR) 5 MG tablet TAKE ONE TABLET BY MOUTH DAILY 90 tablet 3   traMADol (ULTRAM) 50 MG tablet Take 1 tablet (50 mg total) by mouth every 4 (four) hours as needed for moderate pain. (Patient not taking: Reported on 12/26/2021) 28 tablet 0  Vital Signs:   Physical Exam: CV Pulmonary Abdomen Extremities Wounds  Diagnostic Tests: ***  Impression and Plan: She was seen by Dr. Einar Gip in follow up on 01/02/2022. Endocarditis is a potentially serious infection of heart valves or inside lining of the heart.  It occurs more commonly in patients with diseased heart valves (such as patient's with aortic or mitral valve disease) and in patients who have undergone heart valve repair or replacement.  Certain surgical and dental procedures may put you at risk, such as dental cleaning, other dental procedures, or any surgery involving the respiratory, urinary, gastrointestinal tract, gallbladder or prostate gland.   To minimize your chances for develooping endocarditis, maintain good oral health and seek prompt medical attention for any infections involving the mouth, teeth, gums, skin or urinary tract.    Always notify your doctor or dentist about your underlying heart valve condition before having any invasive  procedures. You will need to take antibiotics before certain procedures, including all routine dental cleanings or other dental procedures.  Your cardiologist or dentist should prescribe these antibiotics for you to be taken ahead of time.          Nani Skillern, PA-C [facility] [phone]

## 2022-01-02 NOTE — Progress Notes (Signed)
Primary Physician/Referring:  Ronita Hipps, MD  Patient ID: Joanna Alvarado, female    DOB: 05-Aug-1955, 66 y.o.   MRN: [id number]  No chief complaint on file.  HPI:    Joanna Alvarado  is a 66 y.o. female with a past medical history of hypertension, severe aortic regurgitation S/P AV replacement 11/24/2021 with bioprosthetic AV. controlled diabetes mellitus, now only has hyperglycemia with weight loss morbid obesity with sleep apnea using CPAP at night, GERD, hypercholesterolemia, hiatal hernia, fibromyalgia, hypothyroidism and chronic stable angina with normal coronary arteries/minimal coronary artery disease by angiography in April 2021.  She presents for postsurgical follow-up, presently doing well still is recuperating from surgery.  Still has shortness of breath.  No fever or chills, no leg edema.  Accompanied by her husband.  Due to pleural effusions was started on furosemide by Dr. Dahlia Byes.  She has an appointment to see him tomorrow.  Past Surgical History:  Procedure Laterality Date   ABDOMINAL HYSTERECTOMY     AORTIC VALVE REPLACEMENT N/A 11/24/2021   Procedure: AORTIC VALVE REPLACEMENT (AVR) USING 25MM INSPIRIS RESILIA  AORTIC VALVE;  Surgeon: Dahlia Byes, MD;  Location: [facility];  Service: Open Heart Surgery;  Laterality: N/A;   APPENDECTOMY     CHOLECYSTECTOMY     COLONOSCOPY N/A 07/27/2015   Procedure: COLONOSCOPY;  Surgeon: Irene Shipper, MD;  Location: [facility];  Service: Endoscopy;  Laterality: N/A;   COLONOSCOPY WITH PROPOFOL N/A 04/28/2019   Procedure: COLONOSCOPY WITH PROPOFOL;  Surgeon: Irene Shipper, MD;  Location: [facility];  Service: Endoscopy;  Laterality: N/A;   EYE SURGERY     bilateral cataracts with lens implants   floater bone surgery     bone removed from right hand   FOOT SURGERY     RIGHT   FOOT SURGERY Left    triple orthodesis   left foot surgery     removed tendon and placed pins in foot   LEFT HEART CATH AND CORONARY ANGIOGRAPHY  N/A 09/26/2019   Procedure: LEFT HEART CATH AND CORONARY ANGIOGRAPHY;  Surgeon: Leonie Man, MD;  Location: [facility];  Service: Cardiovascular;  Laterality: N/A;   PLANTAR FASCIA SURGERY     both feet   POLYPECTOMY  04/28/2019   Procedure: POLYPECTOMY;  Surgeon: Irene Shipper, MD;  Location: [facility];  Service: Endoscopy;;   RIGHT HEART CATH N/A 02/15/2021   Procedure: RIGHT HEART CATH;  Surgeon: Nigel Mormon, MD;  Location: [facility];  Service: Cardiovascular;  Laterality: N/A;   RIGHT/LEFT HEART CATH AND CORONARY ANGIOGRAPHY N/A 11/08/2021   Procedure: RIGHT/LEFT HEART CATH AND CORONARY ANGIOGRAPHY;  Surgeon: Adrian Prows, MD;  Location: [facility];  Service: Cardiovascular;  Laterality: N/A;   TEE WITHOUT CARDIOVERSION N/A 11/24/2021   Procedure: TRANSESOPHAGEAL ECHOCARDIOGRAM (TEE);  Surgeon: Dahlia Byes, MD;  Location: [facility];  Service: Open Heart Surgery;  Laterality: N/A;   TONSILLECTOMY      Social History   Tobacco Use   Smoking status: Never   Smokeless tobacco: Never  Substance Use Topics   Alcohol use: Never   Marital Status: Married   ROS  Review of Systems  Cardiovascular:  Negative for chest pain, leg swelling and orthopnea.  Respiratory:  Positive for snoring (on cpap).    Objective  Blood pressure 135/80, pulse 67, temperature 97.9 F (36.6 C), temperature source Temporal, resp. rate 12, height '5\' 8"'$  (1.727 m), weight (!) 314 lb 8  oz (142.7 kg), SpO2 96 %.     01/03/2022    3:12 PM 01/02/2022    2:44 PM 12/26/2021    3:03 PM  Vitals with BMI  Height '5\' 8"'$  '5\' 8"'$  '5\' 8"'$   Weight 314 lbs 314 lbs 8 oz 325 lbs  BMI 47.75 43.15 40.08  Systolic 676 195 093  Diastolic 85 80 80  Pulse 79 67 59     Physical Exam Constitutional:      Comments: Morbidly obese. No acute distress.  Neck:     Vascular: No JVD.  Cardiovascular:     Rate and Rhythm: Normal rate and regular rhythm.     Pulses:          Carotid pulses are 2+ on the  right side with bruit and 2+ on the left side with bruit.      Radial pulses are 2+ on the right side and 2+ on the left side.       Dorsalis pedis pulses are 2+ on the right side and 2+ on the left side.       Posterior tibial pulses are 2+ on the right side and 2+ on the left side.     Heart sounds: Murmur heard.     High-pitched blowing decrescendo early diastolic murmur is present with a grade of 2/4 at the upper right sternal border radiating to the neck and apex.     Comments: Popliteal pulses difficult to palpate due to patient body habitus. Pulmonary:     Effort: Pulmonary effort is normal.     Breath sounds: Normal breath sounds.  Abdominal:     General: Bowel sounds are normal.     Palpations: Abdomen is soft.     Comments: Large pannus  Musculoskeletal:     Right lower leg: No edema.     Left lower leg: No edema.    Laboratory examination:   Recent Labs    11/27/21 0450 11/28/21 0308 11/29/21 0210  NA 138 139 140  K 3.7 4.1 4.0  CL 106 108 109  CO2 '24 24 23  '$ GLUCOSE 107* 103* 108*  BUN 40* 33* 27*  CREATININE 1.32* 1.17* 1.01*  CALCIUM 8.7* 8.7* 8.8*  GFRNONAA 44* 51* >60    CrCl cannot be calculated (Patient's most recent lab result is older than the maximum 21 days allowed.).     Latest Ref Rng & Units 11/29/2021    2:10 AM 11/28/2021    3:08 AM 11/27/2021    4:50 AM  CMP  Glucose 70 - 99 mg/dL 108  103  107   BUN 8 - 23 mg/dL 27  33  40   Creatinine 0.44 - 1.00 mg/dL 1.01  1.17  1.32   Sodium 135 - 145 mmol/L 140  139  138   Potassium 3.5 - 5.1 mmol/L 4.0  4.1  3.7   Chloride 98 - 111 mmol/L 109  108  106   CO2 22 - 32 mmol/L '23  24  24   '$ Calcium 8.9 - 10.3 mg/dL 8.8  8.7  8.7       Latest Ref Rng & Units 11/29/2021    2:10 AM 11/28/2021    3:08 AM 11/27/2021    4:50 AM  CBC  WBC 4.0 - 10.5 K/uL 6.9  7.9  8.1   Hemoglobin 12.0 - 15.0 g/dL 10.1  9.8  9.6   Hematocrit 36.0 - 46.0 % 29.5  29.0  28.7   Platelets 150 -  400 K/uL 77  68  66     Lipid  Panel No results for input(s): "CHOL", "TRIG", "LDLCALC", "VLDL", "HDL", "CHOLHDL", "LDLDIRECT" in the last 8760 hours.  External labs:    Labs 04/01/2020:  A1c 5.5%.  TSH normal.  Free T4 minimally elevated.  Vitamin D normal at 36.  A1C 6.7% 11/2019  Medications and allergies   Allergies  Allergen Reactions   Metamucil [Psyllium] Other (See Comments)    Severe constipation   Trulicity [Dulaglutide]     KIDNEY INFECTION,INABILITY TO HAVE BOWEL MOVEMENTS,BLOOD IN URINE   Adhesive [Tape]     Latex, band aids  causes whelts and blisters   Hydrocodone-Acetaminophen Itching    Tolerates small/ infrequent doses   Latex Other (See Comments)    Band-aids causes whelts and blisters   Oxycontin [Oxycodone Hcl]     "FELT LIKE HEAD WAS GOING TO EXPLODE"   Ciprofloxacin Rash    Yeast infection and a severe rash    Codeine Itching and Rash   Other Palpitations    Steroids      "makes me feel like I'm having a heart attack. -     Current Outpatient Medications:    acetaminophen (TYLENOL) 500 MG tablet, Take 1,000 mg by mouth every 6 (six) hours as needed for moderate pain., Disp: , Rfl:    diclofenac (CATAFLAM) 50 MG tablet, Take 1 tablet (50 mg total) by mouth daily., Disp: , Rfl:    famotidine (PEPCID) 40 MG tablet, Take 40 mg by mouth at bedtime., Disp: , Rfl:    furosemide (LASIX) 40 MG tablet, Take 1 tablet (40 mg total) by mouth daily., Disp: 30 tablet, Rfl: 1   levothyroxine (SYNTHROID, LEVOTHROID) 175 MCG tablet, Take 175-262.5 mcg by mouth See admin instructions. Take 175 mcg by mouth daily, except on Tuesday take 262.5 mcg, Disp: , Rfl:    linaclotide (LINZESS) 290 MCG CAPS capsule, Take 290 mcg by mouth daily before breakfast., Disp: , Rfl:    metoprolol tartrate (LOPRESSOR) 25 MG tablet, Take 1 tablet (25 mg total) by mouth 2 (two) times daily., Disp: 60 tablet, Rfl: 1   nitroGLYCERIN (NITROSTAT) 0.4 MG SL tablet, Place 0.4 mg under the tongue every 5 (five) minutes as  needed for chest pain., Disp: , Rfl:    omeprazole (PRILOSEC) 40 MG capsule, Take 40 mg by mouth daily., Disp: , Rfl:    polyethylene glycol (MIRALAX / GLYCOLAX) 17 g packet, Take 17 g by mouth daily., Disp: , Rfl:    potassium chloride SA (KLOR-CON M) 20 MEQ tablet, Take 1 tablet (20 mEq total) by mouth daily., Disp: 30 tablet, Rfl: 1   rosuvastatin (CRESTOR) 5 MG tablet, TAKE ONE TABLET BY MOUTH DAILY, Disp: 90 tablet, Rfl: 3   Radiology:   CT angiogram chest 10/07/2021: There is mild aortic atherosclerosis, no dissection. Fusiform aneurysmal dilatation of the ascending aorta, maximal dimension 4.2 cm. Recommend annual imaging followup by CTA or MRA. Tiny hiatal hernia.  CXR 2 view 01/02/2022: There is elevation of the left hemidiaphragm with left basilar atelectasis. There is gaseous distention of the stomach. This is increased compared to prior CT. No pleural effusion or pneumothorax identified. Cardiomediastinal silhouette is within normal limits. Patient is status post cardiac valve replacement.  Cardiac Studies:   Carotid artery duplex 2020-03-18: The bifurcation, internal, external and common carotid arteries reveal no evidence of significant stenosis, bilaterally. Antegrade right vertebral artery flow. Antegrade left vertebral artery flow.  ECHOCARDIOGRAM COMPLETE 11/22/2021  1. Left ventricular ejection fraction, by estimation, is 60 to 65%. Left ventricular ejection fraction by PLAX is 61 %. The left ventricle has normal function. The left ventricle has no regional wall motion abnormalities. The left ventricular internal  cavity size was moderately dilated. Left ventricular diastolic parameters were normal.  2. Right ventricular systolic function is normal. The right ventricular size is normal. There is normal pulmonary artery systolic pressure.  3. Left atrial size was mildly dilated.  4. The mitral valve is normal in structure. Mild mitral valve regurgitation. No evidence of  mitral stenosis.  5. The aortic valve is tricuspid. Aortic valve regurgitation is moderate to severe. Aortic regurgitation PHT measures 398 msec.  6. There is mild dilatation of the ascending aorta and of the aortic root, measuring 41 mm.   Comparison(s): No significant change from prior study. 09/06/2021. Previously LV was mildly dilated at 5.7 cm now 6.0 cm.  Right & Left Heart Catheterization 11/08/21:  LV: 134/9, EDP 21 mmHg.  Ao 130/52, mean 97 mmHg.  Pulse pressure 82 mmHg.  Moderately elevated LVEDP. LM: Large vessel: Smooth and normal. LAD: Large caliber vessel.  Gives origin to a moderate-sized D1 which immediately bifurcates into secondary branch.  Ostium has 85% focal stenosis.  D2 is moderate size, mild disease.  Mid segment of the LAD after D2 has 10 to 20% stenosis. CX: Gives origin to a small high OM1 followed by a moderate to large sized OM 2 and small OM 3 and OM 4.  AV groove circumflex is small to moderate-sized vessel with the ostium 10 to 20% stenosis. RCA: Dominant.  Mildly ectatic in the proximal segment.  Mild disease of 10 to 20% in the midsegment.  Slow flow in the right coronary artery. Ascending aortogram: There is moderate dilatation of the ascending aorta.  Aortic regurgitation is evident, severity was not assessed as it was a hand contrast injection.  Right heart: RA 12/9, mean 8 mmHg RV 32/3, EDP 16 mmHg PA 27/6, mean 16 mmHg PA saturation 80%. PW: Not obtained as I had difficulty in advancing the PA catheter to the wedge position due to tortuosity of the peripheral vessels. CO 11.19 and CI 4.57.  QP/QS 1.00.  Recommendation: Patient will be reassessed by Dr. Darcey Nora for consideration of [place] procedure.  D1 is small to moderate size, will discuss with him regarding revascularization versus medical therapy only.    EKG:   EKG 01/02/2022: Normal sinus rhythm at rate of 68 bpm, left axis deviation, left anterior fascicular block.  Poor R wave progression,  cannot exclude anteroseptal infarct old.  LVH.  Diffuse T wave abnormality.  Compared to 08/31/2021, no significant change.     Assessment     ICD-10-CM   1. Primary hypertension  I10 EKG 12-Lead    2. S/P aortic valve replacement with 25 mm Inspiris Resilia bioprosthetic valve 11/24/2021  Z95.3     3. Aortic root dilatation (HCC)  I77.810     4. Recurrent left pleural effusion  J90     5. Stage 3a chronic kidney disease (HCC)  N18.31      Medications Discontinued During This Encounter  Medication Reason   traMADol (ULTRAM) 50 MG tablet     No orders of the defined types were placed in this encounter.  Orders Placed This Encounter  Procedures   EKG 12-Lead    Recommendations:   Joanna Alvarado is a 66 y.o. female with a past medical history of hypertension, severe aortic  regurgitation S/P AV replacement 11/24/2021 with bioprosthetic AV. controlled diabetes mellitus, now only has hyperglycemia with weight loss morbid obesity with sleep apnea using CPAP at night, GERD, hypercholesterolemia, hiatal hernia, fibromyalgia, hypothyroidism and chronic stable angina with normal coronary arteries/minimal coronary artery disease by angiography in April 2021.  She presents for postsurgical follow-up, presently doing well still is recuperating from surgery.  Still has shortness of breath.  I pended her note for 1 day, I reviewed her note from Dr. Darcey Nora and also reviewed the chest x-ray.  She has significant left hemidiaphragm elevation and no pleural effusion fortunately.  Hence does not need any further aggressive diuresis.  She has been gradually losing weight, still morbidly obese, I encouraged her to continue to lose weight.  She needs repeat echocardiogram to follow-up on the aortic valve replacement.  She does have a prominent systolic ejection murmur.  She has aortic root dilatation but only mild.  We will continue to follow this as well.  We will continue to make changes to her  medications as she continues to recuperate from recent surgery.  Her husband is present and all questions answered.  She is also being enrolled in cardiac rehab which she will be starting in a week.  Blood pressure is well controlled, otherwise she has done well postsurgically, continue present medications, I would like to see her back in 6 weeks for follow-up.  40-minute office visit encounter.    Adrian Prows, MD, [facility] 01/03/2022, 8:38 PM Office: [phone]

## 2022-01-03 ENCOUNTER — Encounter: Payer: Self-pay | Admitting: Cardiology

## 2022-01-03 ENCOUNTER — Ambulatory Visit (INDEPENDENT_AMBULATORY_CARE_PROVIDER_SITE_OTHER): Payer: Self-pay | Admitting: Physician Assistant

## 2022-01-03 ENCOUNTER — Ambulatory Visit
Admission: RE | Admit: 2022-01-03 | Discharge: 2022-01-03 | Disposition: A | Discharge status: Home / Self Care | Payer: HMO | Source: Ambulatory Visit | Attending: Thoracic Surgery (Cardiothoracic Vascular Surgery) | Admitting: Thoracic Surgery (Cardiothoracic Vascular Surgery)

## 2022-01-03 VITALS — BP 133/85 | HR 79 | Resp 20 | Ht 68.0 in | Wt 314.0 lb

## 2022-01-03 DIAGNOSIS — I351 Nonrheumatic aortic (valve) insufficiency: Secondary | ICD-10-CM | POA: Diagnosis not present

## 2022-01-03 DIAGNOSIS — Z953 Presence of xenogenic heart valve: Secondary | ICD-10-CM

## 2022-01-03 DIAGNOSIS — Z952 Presence of prosthetic heart valve: Secondary | ICD-10-CM | POA: Diagnosis not present

## 2022-01-03 DIAGNOSIS — I10 Essential (primary) hypertension: Secondary | ICD-10-CM | POA: Diagnosis not present

## 2022-01-03 DIAGNOSIS — J9811 Atelectasis: Secondary | ICD-10-CM | POA: Diagnosis not present

## 2022-01-03 NOTE — Patient Instructions (Signed)

## 2022-01-04 LAB — CBC
Hematocrit: 35.7 % (ref 34.0–46.6)
Hemoglobin: 11.4 g/dL (ref 11.1–15.9)
MCH: 28.7 pg (ref 26.6–33.0)
MCHC: 31.9 g/dL (ref 31.5–35.7)
MCV: 90 fL (ref 79–97)
Platelets: 88 10*3/uL — CL (ref 150–450)
RBC: 3.97 x10E6/uL (ref 3.77–5.28)
RDW: 13.1 % (ref 11.7–15.4)
WBC: 3.6 10*3/uL (ref 3.4–10.8)

## 2022-01-04 LAB — CMP14+EGFR
ALT: 11 IU/L (ref 0–32)
AST: 31 IU/L (ref 0–40)
Albumin/Globulin Ratio: 1.7 (ref 1.2–2.2)
Albumin: 3.4 g/dL — ABNORMAL LOW (ref 3.9–4.9)
Alkaline Phosphatase: 115 IU/L (ref 44–121)
BUN/Creatinine Ratio: 15 (ref 12–28)
BUN: 16 mg/dL (ref 8–27)
Bilirubin Total: 0.6 mg/dL (ref 0.0–1.2)
CO2: 20 mmol/L (ref 20–29)
Calcium: 9.5 mg/dL (ref 8.7–10.3)
Chloride: 110 mmol/L — ABNORMAL HIGH (ref 96–106)
Creatinine, Ser: 1.1 mg/dL — ABNORMAL HIGH (ref 0.57–1.00)
Globulin, Total: 2 g/dL (ref 1.5–4.5)
Glucose: 97 mg/dL (ref 70–99)
Potassium: 4 mmol/L (ref 3.5–5.2)
Sodium: 143 mmol/L (ref 134–144)
Total Protein: 5.4 g/dL — ABNORMAL LOW (ref 6.0–8.5)
eGFR: 55 mL/min/{1.73_m2} — ABNORMAL LOW (ref >59)

## 2022-01-05 ENCOUNTER — Other Ambulatory Visit: Payer: Self-pay | Admitting: Physician Assistant

## 2022-01-13 ENCOUNTER — Other Ambulatory Visit: Payer: PPO

## 2022-01-13 DIAGNOSIS — I10 Essential (primary) hypertension: Secondary | ICD-10-CM | POA: Diagnosis not present

## 2022-01-13 DIAGNOSIS — Z952 Presence of prosthetic heart valve: Secondary | ICD-10-CM

## 2022-01-13 DIAGNOSIS — I209 Angina pectoris, unspecified: Secondary | ICD-10-CM | POA: Diagnosis not present

## 2022-01-16 DIAGNOSIS — Z954 Presence of other heart-valve replacement: Secondary | ICD-10-CM | POA: Diagnosis not present

## 2022-01-16 DIAGNOSIS — I208 Other forms of angina pectoris: Secondary | ICD-10-CM | POA: Diagnosis not present

## 2022-01-16 DIAGNOSIS — E785 Hyperlipidemia, unspecified: Secondary | ICD-10-CM | POA: Diagnosis not present

## 2022-01-17 ENCOUNTER — Other Ambulatory Visit: Payer: Self-pay | Admitting: Cardiology

## 2022-01-17 DIAGNOSIS — Z952 Presence of prosthetic heart valve: Secondary | ICD-10-CM

## 2022-01-25 ENCOUNTER — Other Ambulatory Visit: Payer: Self-pay | Admitting: Physician Assistant

## 2022-01-30 ENCOUNTER — Other Ambulatory Visit: Payer: Self-pay | Admitting: *Deleted

## 2022-01-30 ENCOUNTER — Encounter: Payer: Self-pay | Admitting: Cardiothoracic Surgery

## 2022-01-30 DIAGNOSIS — Z953 Presence of xenogenic heart valve: Secondary | ICD-10-CM

## 2022-01-31 ENCOUNTER — Ambulatory Visit
Admission: RE | Admit: 2022-01-31 | Discharge: 2022-01-31 | Disposition: A | Discharge status: Home / Self Care | Payer: HMO | Source: Ambulatory Visit | Attending: Cardiothoracic Surgery | Admitting: Cardiothoracic Surgery

## 2022-01-31 ENCOUNTER — Ambulatory Visit (INDEPENDENT_AMBULATORY_CARE_PROVIDER_SITE_OTHER): Payer: Self-pay | Admitting: Cardiothoracic Surgery

## 2022-01-31 ENCOUNTER — Encounter: Payer: Self-pay | Admitting: Cardiothoracic Surgery

## 2022-01-31 VITALS — BP 117/78 | HR 89 | Resp 20 | Ht 68.0 in | Wt 316.0 lb

## 2022-01-31 DIAGNOSIS — R0602 Shortness of breath: Secondary | ICD-10-CM | POA: Diagnosis not present

## 2022-01-31 DIAGNOSIS — Z5189 Encounter for other specified aftercare: Secondary | ICD-10-CM

## 2022-01-31 DIAGNOSIS — Z953 Presence of xenogenic heart valve: Secondary | ICD-10-CM

## 2022-01-31 NOTE — Progress Notes (Signed)
HPI Patient returns for 36-monthfollow-up after AVR with chest x-ray.  She continues to do well.  [place]  She denies shortness of breath.  She is able to walk further distances now in rehab.  Surgical incisions healing well.  Chest x-ray today shows sternal wires intact, clear lung fields,  mild elevation of left hemidiaphragm persistent since surgery.  Current Outpatient Medications  Medication Sig Dispense Refill   acetaminophen (TYLENOL) 500 MG tablet Take 1,000 mg by mouth every 6 (six) hours as needed for moderate pain.     diclofenac (CATAFLAM) 50 MG tablet Take 1 tablet (50 mg total) by mouth daily.     famotidine (PEPCID) 40 MG tablet Take 40 mg by mouth at bedtime.     levothyroxine (SYNTHROID, LEVOTHROID) 175 MCG tablet Take 175-262.5 mcg by mouth See admin instructions. Take 175 mcg by mouth daily, except on Tuesday take 262.5 mcg     linaclotide (LINZESS) 290 MCG CAPS capsule Take 290 mcg by mouth daily before breakfast.     metoprolol tartrate (LOPRESSOR) 25 MG tablet Take 1 tablet (25 mg total) by mouth 2 (two) times daily. 60 tablet 1   nitroGLYCERIN (NITROSTAT) 0.4 MG SL tablet Place 0.4 mg under the tongue every 5 (five) minutes as needed for chest pain.     omeprazole (PRILOSEC) 40 MG capsule Take 40 mg by mouth daily.     polyethylene glycol (MIRALAX / GLYCOLAX) 17 g packet Take 17 g by mouth daily.     rosuvastatin (CRESTOR) 5 MG tablet TAKE ONE TABLET BY MOUTH DAILY 90 tablet 3   No current facility-administered medications for this visit.     Review of Systems: No fever No bleeding from her ITP No edema  Physical Exam Blood pressure 117/78, pulse 89, resp. rate 20, height '5\' 8"'$  (1.727 m), weight (!) 316 lb (143.3 kg), SpO2 94 %.   Alert and comfortable Lungs clear Sternal incision well-healed Heart rate regular No cardiac murmur aortic valve sounds normal   Diagnostic Tests: Chest x-ray images personally reviewed.  Clear  lung fields.  Persistent left hemidiaphragm elevation.  Impression: Patient doing well.  No symptoms related to elevated left hemidiaphragm. She will continue her cardiac rehab and current meds. Follow-up echo by Dr. GEinar Gipreport is pending. Plan: Return for final postop visit in 6 weeks. Do not lift more than 15 pounds and no lifting weights above head rehab program.   PDahlia Byes MD [facility] [phone]

## 2022-02-02 ENCOUNTER — Ambulatory Visit: Payer: HMO | Admitting: Cardiology

## 2022-02-02 ENCOUNTER — Encounter: Payer: Self-pay | Admitting: Cardiology

## 2022-02-02 VITALS — BP 117/73 | HR 66 | Temp 97.6°F | Resp 16 | Ht 68.0 in | Wt 314.0 lb

## 2022-02-02 DIAGNOSIS — Z953 Presence of xenogenic heart valve: Secondary | ICD-10-CM | POA: Diagnosis not present

## 2022-02-02 DIAGNOSIS — I209 Angina pectoris, unspecified: Secondary | ICD-10-CM

## 2022-02-02 DIAGNOSIS — N1831 Chronic kidney disease, stage 3a: Secondary | ICD-10-CM

## 2022-02-02 DIAGNOSIS — I77810 Thoracic aortic ectasia: Secondary | ICD-10-CM

## 2022-02-02 DIAGNOSIS — I10 Essential (primary) hypertension: Secondary | ICD-10-CM

## 2022-02-02 MED ORDER — NITROGLYCERIN 0.4 MG SL SUBL: 0.4000 mg | SUBLINGUAL_TABLET | SUBLINGUAL | 2 refills | Status: DC | PRN

## 2022-02-02 MED ORDER — LOSARTAN POTASSIUM 25 MG PO TABS: 25.0000 mg | ORAL_TABLET | Freq: Every evening | ORAL | 3 refills | Status: DC

## 2022-02-02 NOTE — Progress Notes (Signed)
Primary Physician/Referring:  Ronita Hipps, MD  Patient ID: Joanna Alvarado, female    DOB: 11/14/1955, 66 y.o.   MRN: [id number]  Chief Complaint  Patient presents with   Aortic valve replacement   Follow-up    4 weeks   HPI:    Joanna Alvarado  is a 66 y.o. female with a past medical history of hypertension, severe aortic regurgitation S/P AV replacement 11/24/2021 with bioprosthetic AV. controlled diabetes mellitus, now only has hyperglycemia with weight loss morbid obesity with sleep apnea using CPAP at night, GERD, hypercholesterolemia, hiatal hernia, fibromyalgia, hypothyroidism and chronic stable angina with normal coronary arteries/minimal coronary artery disease by angiography in April 2021.  She has recuperated well from aortic valve replacement.  Dyspnea still present but is much improved.  No fever or chills, no leg edema.  Accompanied by her husband.     Past Surgical History:  Procedure Laterality Date   ABDOMINAL HYSTERECTOMY     AORTIC VALVE REPLACEMENT N/A 11/24/2021   Procedure: AORTIC VALVE REPLACEMENT (AVR) USING 25MM INSPIRIS RESILIA  AORTIC VALVE;  Surgeon: Dahlia Byes, MD;  Location: [facility];  Service: Open Heart Surgery;  Laterality: N/A;   APPENDECTOMY     CHOLECYSTECTOMY     COLONOSCOPY N/A 07/27/2015   Procedure: COLONOSCOPY;  Surgeon: Irene Shipper, MD;  Location: [facility];  Service: Endoscopy;  Laterality: N/A;   COLONOSCOPY WITH PROPOFOL N/A 04/28/2019   Procedure: COLONOSCOPY WITH PROPOFOL;  Surgeon: Irene Shipper, MD;  Location: [facility];  Service: Endoscopy;  Laterality: N/A;   EYE SURGERY     bilateral cataracts with lens implants   floater bone surgery     bone removed from right hand   FOOT SURGERY     RIGHT   FOOT SURGERY Left    triple orthodesis   left foot surgery     removed tendon and placed pins in foot   LEFT HEART CATH AND CORONARY ANGIOGRAPHY N/A 09/26/2019   Procedure: LEFT HEART CATH AND CORONARY ANGIOGRAPHY;  Surgeon:  Leonie Man, MD;  Location: [facility];  Service: Cardiovascular;  Laterality: N/A;   PLANTAR FASCIA SURGERY     both feet   POLYPECTOMY  04/28/2019   Procedure: POLYPECTOMY;  Surgeon: Irene Shipper, MD;  Location: [facility];  Service: Endoscopy;;   RIGHT HEART CATH N/A 02/15/2021   Procedure: RIGHT HEART CATH;  Surgeon: Nigel Mormon, MD;  Location: [facility];  Service: Cardiovascular;  Laterality: N/A;   RIGHT/LEFT HEART CATH AND CORONARY ANGIOGRAPHY N/A 11/08/2021   Procedure: RIGHT/LEFT HEART CATH AND CORONARY ANGIOGRAPHY;  Surgeon: Adrian Prows, MD;  Location: [facility];  Service: Cardiovascular;  Laterality: N/A;   TEE WITHOUT CARDIOVERSION N/A 11/24/2021   Procedure: TRANSESOPHAGEAL ECHOCARDIOGRAM (TEE);  Surgeon: Dahlia Byes, MD;  Location: [facility];  Service: Open Heart Surgery;  Laterality: N/A;   TONSILLECTOMY      Social History   Tobacco Use   Smoking status: Never   Smokeless tobacco: Never  Substance Use Topics   Alcohol use: Never   Marital Status: Married   ROS  Review of Systems  Cardiovascular:  Positive for dyspnea on exertion (mild). Negative for chest pain, leg swelling and orthopnea.  Respiratory:  Positive for snoring (on cpap).    Objective  Blood pressure 117/73, pulse 66, temperature 97.6 F (36.4 C), temperature source Temporal, resp. rate 16, height '5\' 8"'$  (1.727 m), weight (!) 314 lb (142.4 kg),  SpO2 97 %.     02/02/2022    3:05 PM 01/31/2022    4:18 PM 01/03/2022    3:12 PM  Vitals with BMI  Height '5\' 8"'$  '5\' 8"'$  '5\' 8"'$   Weight 314 lbs 316 lbs 314 lbs  BMI 47.75 16.01 09.32  Systolic 355 732 202  Diastolic 73 78 85  Pulse 66 89 79     Physical Exam Constitutional:      Appearance: She is morbidly obese.     Comments: Morbidly obese. No acute distress.  Neck:     Vascular: No JVD.  Cardiovascular:     Rate and Rhythm: Normal rate and regular rhythm.     Pulses: Normal pulses and intact distal pulses.           Radial pulses are 2+ on the right side and 2+ on the left side.       Dorsalis pedis pulses are 2+ on the right side and 2+ on the left side.       Posterior tibial pulses are 2+ on the right side and 2+ on the left side.     Heart sounds: Murmur heard.     Early diastolic murmur is present with a grade of 2/4 at the upper right sternal border radiating to the apex.  Pulmonary:     Effort: Pulmonary effort is normal.     Breath sounds: Normal breath sounds.  Abdominal:     General: Bowel sounds are normal.     Palpations: Abdomen is soft.     Comments: Large pannus  Musculoskeletal:     Right lower leg: No edema.     Left lower leg: No edema.    Laboratory examination:   Recent Labs    11/27/21 0450 11/28/21 0308 11/29/21 0210 01/03/22 1206  NA 138 139 140 143  K 3.7 4.1 4.0 4.0  CL 106 108 109 110*  CO2 '24 24 23 20  '$ GLUCOSE 107* 103* 108* 97  BUN 40* 33* 27* 16  CREATININE 1.32* 1.17* 1.01* 1.10*  CALCIUM 8.7* 8.7* 8.8* 9.5  GFRNONAA 44* 51* >60  --     CrCl cannot be calculated (Patient's most recent lab result is older than the maximum 21 days allowed.).     Latest Ref Rng & Units 01/03/2022   12:06 PM 11/29/2021    2:10 AM 11/28/2021    3:08 AM  CMP  Glucose 70 - 99 mg/dL 97  108  103   BUN 8 - 27 mg/dL 16  27  33   Creatinine 0.57 - 1.00 mg/dL 1.10  1.01  1.17   Sodium 134 - 144 mmol/L 143  140  139   Potassium 3.5 - 5.2 mmol/L 4.0  4.0  4.1   Chloride 96 - 106 mmol/L 110  109  108   CO2 20 - 29 mmol/L '20  23  24   '$ Calcium 8.7 - 10.3 mg/dL 9.5  8.8  8.7   Total Protein 6.0 - 8.5 g/dL 5.4     Total Bilirubin 0.0 - 1.2 mg/dL 0.6     Alkaline Phos 44 - 121 IU/L 115     AST 0 - 40 IU/L 31     ALT 0 - 32 IU/L 11         Latest Ref Rng & Units 01/03/2022   12:08 PM 11/29/2021    2:10 AM 11/28/2021    3:08 AM  CBC  WBC 3.4 - 10.8 x10E3/uL 3.6  6.9  7.9   Hemoglobin 11.1 - 15.9 g/dL 11.4  10.1  9.8   Hematocrit 34.0 - 46.6 % 35.7  29.5  29.0   Platelets 150 -  450 x10E3/uL 88  77  68     Lipid Panel No results for input(s): "CHOL", "TRIG", "LDLCALC", "VLDL", "HDL", "CHOLHDL", "LDLDIRECT" in the last 8760 hours.  External labs:   Medications and allergies   Allergies  Allergen Reactions   Metamucil [Psyllium] Other (See Comments)    Severe constipation   Trulicity [Dulaglutide]     KIDNEY INFECTION,INABILITY TO HAVE BOWEL MOVEMENTS,BLOOD IN URINE   Adhesive [Tape]     Latex, band aids  causes whelts and blisters   Hydrocodone-Acetaminophen Itching    Tolerates small/ infrequent doses   Latex Other (See Comments)    Band-aids causes whelts and blisters   Oxycontin [Oxycodone Hcl]     "FELT LIKE HEAD WAS GOING TO EXPLODE"   Ciprofloxacin Rash    Yeast infection and a severe rash    Codeine Itching and Rash   Other Palpitations    Steroids      "makes me feel like I'm having a heart attack. -     Current Outpatient Medications:    acetaminophen (TYLENOL) 500 MG tablet, Take 1,000 mg by mouth every 6 (six) hours as needed for moderate pain., Disp: , Rfl:    diclofenac (CATAFLAM) 50 MG tablet, Take 1 tablet (50 mg total) by mouth daily., Disp: , Rfl:    famotidine (PEPCID) 40 MG tablet, Take 40 mg by mouth at bedtime., Disp: , Rfl:    levothyroxine (SYNTHROID, LEVOTHROID) 175 MCG tablet, Take 175-262.5 mcg by mouth See admin instructions. Take 175 mcg by mouth daily, except on Tuesday take 262.5 mcg, Disp: , Rfl:    linaclotide (LINZESS) 290 MCG CAPS capsule, Take 290 mcg by mouth daily before breakfast., Disp: , Rfl:    losartan (COZAAR) 25 MG tablet, Take 1 tablet (25 mg total) by mouth every evening., Disp: 90 tablet, Rfl: 3   metoprolol tartrate (LOPRESSOR) 25 MG tablet, Take 1 tablet (25 mg total) by mouth 2 (two) times daily., Disp: 60 tablet, Rfl: 1   omeprazole (PRILOSEC) 40 MG capsule, Take 40 mg by mouth daily., Disp: , Rfl:    polyethylene glycol (MIRALAX / GLYCOLAX) 17 g packet, Take 17 g by mouth daily., Disp: , Rfl:     rosuvastatin (CRESTOR) 5 MG tablet, TAKE ONE TABLET BY MOUTH DAILY, Disp: 90 tablet, Rfl: 3   nitroGLYCERIN (NITROSTAT) 0.4 MG SL tablet, Place 1 tablet (0.4 mg total) under the tongue every 5 (five) minutes as needed for chest pain., Disp: 25 tablet, Rfl: 2   Radiology:   CT angiogram chest 10/07/2021: There is mild aortic atherosclerosis, no dissection. Fusiform aneurysmal dilatation of the ascending aorta, maximal dimension 4.2 cm. Recommend annual imaging followup by CTA or MRA. Tiny hiatal hernia.  CXR 2 view 01/02/2022: There is elevation of the left hemidiaphragm with left basilar atelectasis. There is gaseous distention of the stomach. This is increased compared to prior CT. No pleural effusion or pneumothorax identified. Cardiomediastinal silhouette is within normal limits. Patient is status post cardiac valve replacement.  Cardiac Studies:   Carotid artery duplex 04-02-2020: The bifurcation, internal, external and common carotid arteries reveal no evidence of significant stenosis, bilaterally. Antegrade right vertebral artery flow. Antegrade left vertebral artery flow.   Comparison(s): No significant change from prior study. 09/06/2021. Previously LV was mildly dilated at 5.7 cm  now 6.0 cm.  Right & Left Heart Catheterization 11/08/21:  LV: 134/9, EDP 21 mmHg.  Ao 130/52, mean 97 mmHg.  Pulse pressure 82 mmHg.  Moderately elevated LVEDP. LM: Large vessel: Smooth and normal. LAD: Large caliber vessel.  Gives origin to a moderate-sized D1 which immediately bifurcates into secondary branch.  Ostium has 85% focal stenosis.  D2 is moderate size, mild disease.  Mid segment of the LAD after D2 has 10 to 20% stenosis. CX: Gives origin to a small high OM1 followed by a moderate to large sized OM 2 and small OM 3 and OM 4.  AV groove circumflex is small to moderate-sized vessel with the ostium 10 to 20% stenosis. RCA: Dominant.  Mildly ectatic in the proximal segment.  Mild disease of 10  to 20% in the midsegment.  Slow flow in the right coronary artery. Ascending aortogram: There is moderate dilatation of the ascending aorta.  Aortic regurgitation is evident, severity was not assessed as it was a hand contrast injection.  Right heart: RA 12/9, mean 8 mmHg RV 32/3, EDP 16 mmHg PA 27/6, mean 16 mmHg PA saturation 80%. PW: Not obtained as I had difficulty in advancing the PA catheter to the wedge position due to tortuosity of the peripheral vessels. CO 11.19 and CI 4.57.  QP/QS 1.00.  Recommendation: Patient will be reassessed by Dr. Darcey Nora for consideration of [place] procedure.  D1 is small to moderate size, will discuss with him regarding revascularization versus medical therapy only.   Echocardiogram 01/13/2022: 1. Left ventricle cavity is normal in size and wall thickness. Abnormal septal wall motion due to postoperative valve. Normal LV systolic function with EF 56%. Normal diastolic filling pattern. 2. Left atrial cavity is mildly dilated. 3. Well seated bioprosthetic aortic valve (Inspiris resilia size 2m, per op note). Leaflets not well visualized. No significant aortic regurgitation. mean PG 17 mmHg slightly higher than intraoperative TEE. No significant stenosis. 4. Mild (Grade I) mitral regurgitation. 5. Mild tricuspid regurgitation. 6. The aortic root is normal. Mildly dilated ascending aorta at 4.1 cm. 7. No evidence of pulmonary hypertension. 8. Compared to pre-operative TTE dates 11/22/2021, bioprosthetic valve is new. LV dilatation, mod-severe AI is resolved.   EKG:   EKG 01/02/2022: Normal sinus rhythm at rate of 68 bpm, left axis deviation, left anterior fascicular block.  Poor R wave progression, cannot exclude anteroseptal infarct old.  LVH.  Diffuse T wave abnormality.  Compared to 08/31/2021, no significant change.     Assessment     ICD-10-CM   1. S/P aortic valve replacement with 25 mm Inspiris Resilia bioprosthetic valve 11/24/2021  Z95.3      2. Aortic root dilatation (HCC)  I77.810 losartan (COZAAR) 25 MG tablet    3. Stage 3a chronic kidney disease (HCC)  N18.31     4. Primary hypertension  I10     5. Angina pectoris with normal coronary arteriogram (HCC)  I20.9 nitroGLYCERIN (NITROSTAT) 0.4 MG SL tablet     Medications Discontinued During This Encounter  Medication Reason   nitroGLYCERIN (NITROSTAT) 0.4 MG SL tablet Reorder    Meds ordered this encounter  Medications   nitroGLYCERIN (NITROSTAT) 0.4 MG SL tablet    Sig: Place 1 tablet (0.4 mg total) under the tongue every 5 (five) minutes as needed for chest pain.    Dispense:  25 tablet    Refill:  2   losartan (COZAAR) 25 MG tablet    Sig: Take 1 tablet (25 mg total) by mouth  every evening.    Dispense:  90 tablet    Refill:  3   No orders of the defined types were placed in this encounter.   Recommendations:   CLYDIA NIEVES is a 66 y.o. female with a past medical history of hypertension, severe aortic regurgitation S/P AV replacement 11/24/2021 with bioprosthetic AV.  Diet controlled diabetes mellitus, now only has hyperglycemia with weight loss, morbid obesity with sleep apnea using CPAP at night, GERD, hypercholesterolemia, hiatal hernia, fibromyalgia, hypothyroidism and chronic stable angina with normal coronary arteries/minimal coronary artery disease by angiography in April 2021.  She has recuperated well from aortic valve replacement.  Dyspnea still present but is much improved.  She has now developed left hemidiaphragm paralysis.  Advised her to go and encouraged her to use incentive spirometry.  No clinical evidence of heart failure.  Advised her to increase her exercise in the cardiac rehab and increase her target heart rate as well.  Blood pressure is well controlled.  Stage IIIa chronic kidney disease has remained stable.  I have refilled her nitroglycerin for angina pectoris with normal coronary arteries.  She has used 1 sublingual nitroglycerin since  last office visit a month ago.  Otherwise she is on appropriate medical therapy, I will see her back in 6 months for follow-up.  Weight loss again discussed with the patient and her husband was at the bedside.   Adrian Prows, MD, [facility] 02/03/2022, 4:19 PM Office: [phone]

## 2022-02-03 ENCOUNTER — Encounter: Payer: Self-pay | Admitting: Cardiology

## 2022-02-03 DIAGNOSIS — E039 Hypothyroidism, unspecified: Secondary | ICD-10-CM | POA: Diagnosis not present

## 2022-02-03 DIAGNOSIS — E559 Vitamin D deficiency, unspecified: Secondary | ICD-10-CM | POA: Diagnosis not present

## 2022-02-03 DIAGNOSIS — E119 Type 2 diabetes mellitus without complications: Secondary | ICD-10-CM | POA: Diagnosis not present

## 2022-02-03 DIAGNOSIS — E041 Nontoxic single thyroid nodule: Secondary | ICD-10-CM | POA: Diagnosis not present

## 2022-02-03 NOTE — Telephone Encounter (Signed)
Please advise 

## 2022-02-07 DIAGNOSIS — Z953 Presence of xenogenic heart valve: Secondary | ICD-10-CM | POA: Diagnosis not present

## 2022-02-09 ENCOUNTER — Telehealth: Payer: Self-pay

## 2022-02-09 ENCOUNTER — Other Ambulatory Visit: Payer: Self-pay | Admitting: Cardiology

## 2022-02-09 DIAGNOSIS — I77810 Thoracic aortic ectasia: Secondary | ICD-10-CM

## 2022-02-09 DIAGNOSIS — I10 Essential (primary) hypertension: Secondary | ICD-10-CM

## 2022-02-09 MED ORDER — METOPROLOL TARTRATE 25 MG PO TABS
25.0000 mg | ORAL_TABLET | Freq: Two times a day (BID) | ORAL | 1 refills | Status: DC
Start: 2022-02-09 — End: 2022-07-12

## 2022-02-09 NOTE — Telephone Encounter (Signed)
Patient called and stated that Dr. Darcey Nora at the hospital prescribed her Metoprolol tartrate '25mg'$  and she wants to know if you want her to continue taking it. If so, she will need a refill. Please adsvise.

## 2022-02-15 ENCOUNTER — Telehealth: Payer: Self-pay

## 2022-02-15 NOTE — Telephone Encounter (Signed)
-----   Message from Dahlia Byes, MD sent at 02/15/2022  4:29 PM EDT ----- Regarding: RE: medication question Contact: [phone] Yes she can start now same dose ----- Message ----- From: Marylen Ponto, LPN Sent: 4/61/9012   2:04 PM EDT To: Dahlia Byes, MD Subject: medication question                            Mrs Niehaus called today stating that she recently had labs completed with her PCP and the Vit D levels were low. She was taking the vit D prior to surgery and wanted to know if she could restart. Please advise SW

## 2022-02-17 DIAGNOSIS — E119 Type 2 diabetes mellitus without complications: Secondary | ICD-10-CM | POA: Diagnosis not present

## 2022-02-17 DIAGNOSIS — Z953 Presence of xenogenic heart valve: Secondary | ICD-10-CM | POA: Diagnosis not present

## 2022-02-19 DIAGNOSIS — G4733 Obstructive sleep apnea (adult) (pediatric): Secondary | ICD-10-CM | POA: Diagnosis not present

## 2022-02-19 DIAGNOSIS — I10 Essential (primary) hypertension: Secondary | ICD-10-CM | POA: Diagnosis not present

## 2022-03-13 ENCOUNTER — Ambulatory Visit: Payer: HMO

## 2022-03-13 VITALS — BP 141/70 | HR 57 | Temp 97.6°F | Resp 16 | Ht 68.0 in | Wt 327.0 lb

## 2022-03-13 DIAGNOSIS — R600 Localized edema: Secondary | ICD-10-CM | POA: Diagnosis not present

## 2022-03-13 DIAGNOSIS — I10 Essential (primary) hypertension: Secondary | ICD-10-CM | POA: Diagnosis not present

## 2022-03-13 DIAGNOSIS — Z953 Presence of xenogenic heart valve: Secondary | ICD-10-CM

## 2022-03-13 DIAGNOSIS — G4733 Obstructive sleep apnea (adult) (pediatric): Secondary | ICD-10-CM | POA: Diagnosis not present

## 2022-03-13 MED ORDER — TORSEMIDE 20 MG PO TABS: 20.0000 mg | ORAL_TABLET | Freq: Two times a day (BID) | ORAL | 3 refills | Status: DC

## 2022-03-13 NOTE — Progress Notes (Signed)
Primary Physician/Referring:  Ronita Hipps, MD  Patient ID: Joanna Alvarado, female    DOB: 03-08-1956, 66 y.o.   MRN: [id number]  Chief Complaint  Patient presents with   Edema   Follow-up   HPI:    Joanna Alvarado  is a 66 y.o. female with a past medical history of hypertension, severe aortic regurgitation S/P AV replacement 11/24/2021 with bioprosthetic AV, controlled diabetes mellitus, sleep apnea using CPAP at night, GERD, hypercholesterolemia, hiatal hernia, fibromyalgia, hypothyroidism and chronic stable angina with normal coronary arteries/minimal coronary artery disease by angiography in April 2021.  She presents today with complaints of lower leg swelling, dyspnea on exertion, and weight gain of 13 pounds.  She also endorses upper mid abdominal pain after eating.  Denies nausea, vomiting, or diarrhea.  She has been attending cardiac rehab and has noticed increased swelling in her lower legs and ankles worsened over the past week.  She was also unable to walk on the treadmill due to lower extremity swelling and mild shortness of breath.  She has been elevating her legs on 2 pillows at night.  She has not wearing compression stockings.  She has been following a low-sodium diet and has worked on decreasing her portion sizes.  She denies chest pain, dizziness, lightheadedness, syncope, PND.  Past Surgical History:  Procedure Laterality Date   ABDOMINAL HYSTERECTOMY     AORTIC VALVE REPLACEMENT N/A 11/24/2021   Procedure: AORTIC VALVE REPLACEMENT (AVR) USING 25MM INSPIRIS RESILIA  AORTIC VALVE;  Surgeon: Dahlia Byes, MD;  Location: [facility];  Service: Open Heart Surgery;  Laterality: N/A;   APPENDECTOMY     CHOLECYSTECTOMY     COLONOSCOPY N/A 07/27/2015   Procedure: COLONOSCOPY;  Surgeon: Irene Shipper, MD;  Location: [facility];  Service: Endoscopy;  Laterality: N/A;   COLONOSCOPY WITH PROPOFOL N/A 04/28/2019   Procedure: COLONOSCOPY WITH PROPOFOL;  Surgeon: Irene Shipper, MD;   Location: [facility];  Service: Endoscopy;  Laterality: N/A;   EYE SURGERY     bilateral cataracts with lens implants   floater bone surgery     bone removed from right hand   FOOT SURGERY     RIGHT   FOOT SURGERY Left    triple orthodesis   left foot surgery     removed tendon and placed pins in foot   LEFT HEART CATH AND CORONARY ANGIOGRAPHY N/A 09/26/2019   Procedure: LEFT HEART CATH AND CORONARY ANGIOGRAPHY;  Surgeon: Leonie Man, MD;  Location: [facility];  Service: Cardiovascular;  Laterality: N/A;   PLANTAR FASCIA SURGERY     both feet   POLYPECTOMY  04/28/2019   Procedure: POLYPECTOMY;  Surgeon: Irene Shipper, MD;  Location: [facility];  Service: Endoscopy;;   RIGHT HEART CATH N/A 02/15/2021   Procedure: RIGHT HEART CATH;  Surgeon: Nigel Mormon, MD;  Location: [facility];  Service: Cardiovascular;  Laterality: N/A;   RIGHT/LEFT HEART CATH AND CORONARY ANGIOGRAPHY N/A 11/08/2021   Procedure: RIGHT/LEFT HEART CATH AND CORONARY ANGIOGRAPHY;  Surgeon: Adrian Prows, MD;  Location: [facility];  Service: Cardiovascular;  Laterality: N/A;   TEE WITHOUT CARDIOVERSION N/A 11/24/2021   Procedure: TRANSESOPHAGEAL ECHOCARDIOGRAM (TEE);  Surgeon: Dahlia Byes, MD;  Location: [facility];  Service: Open Heart Surgery;  Laterality: N/A;   TONSILLECTOMY      Social History   Tobacco Use   Smoking status: Never   Smokeless tobacco: Never  Substance Use Topics  Alcohol use: Never   Marital Status: Married   ROS  Review of Systems  Cardiovascular:  Positive for dyspnea on exertion (mild) and leg swelling. Negative for chest pain and orthopnea.  Musculoskeletal:  Positive for joint pain.  Gastrointestinal:  Positive for abdominal pain. Negative for constipation, diarrhea, nausea and vomiting.   Objective  Blood pressure (!) 141/70, pulse (!) 57, temperature 97.6 F (36.4 C), temperature source Temporal, resp. rate 16, height '5\' 8"'$  (1.727 m), weight (!)  327 lb (148.3 kg), SpO2 98 %.     03/13/2022    2:36 PM 02/02/2022    3:05 PM 01/31/2022    4:18 PM  Vitals with BMI  Height '5\' 8"'$  '5\' 8"'$  '5\' 8"'$   Weight 327 lbs 314 lbs 316 lbs  BMI 49.73 73.53 29.92  Systolic 426 834 196  Diastolic 70 73 78  Pulse 57 66 89     Physical Exam Constitutional:      Appearance: She is morbidly obese.     Comments: Morbidly obese. No acute distress.  Neck:     Vascular: No JVD.  Cardiovascular:     Rate and Rhythm: Normal rate and regular rhythm.     Pulses: Intact distal pulses.          Radial pulses are 2+ on the right side and 2+ on the left side.       Popliteal pulses are 0 on the right side and 0 on the left side.       Dorsalis pedis pulses are 1+ on the right side and 1+ on the left side.       Posterior tibial pulses are 0 on the right side and 0 on the left side.     Heart sounds: No murmur heard.    No gallop.  Pulmonary:     Effort: Pulmonary effort is normal.     Breath sounds: Normal breath sounds.  Abdominal:     General: Bowel sounds are normal.     Palpations: Abdomen is soft.     Comments: Large pannus  Musculoskeletal:     Right lower leg: No edema.     Left lower leg: No edema.    Laboratory examination:   Recent Labs    11/27/21 0450 11/28/21 0308 11/29/21 0210 01/03/22 1206  NA 138 139 140 143  K 3.7 4.1 4.0 4.0  CL 106 108 109 110*  CO2 '24 24 23 20  '$ GLUCOSE 107* 103* 108* 97  BUN 40* 33* 27* 16  CREATININE 1.32* 1.17* 1.01* 1.10*  CALCIUM 8.7* 8.7* 8.8* 9.5  GFRNONAA 44* 51* >60  --     CrCl cannot be calculated (Patient's most recent lab result is older than the maximum 21 days allowed.).     Latest Ref Rng & Units 01/03/2022   12:06 PM 11/29/2021    2:10 AM 11/28/2021    3:08 AM  CMP  Glucose 70 - 99 mg/dL 97  108  103   BUN 8 - 27 mg/dL 16  27  33   Creatinine 0.57 - 1.00 mg/dL 1.10  1.01  1.17   Sodium 134 - 144 mmol/L 143  140  139   Potassium 3.5 - 5.2 mmol/L 4.0  4.0  4.1   Chloride 96 - 106  mmol/L 110  109  108   CO2 20 - 29 mmol/L '20  23  24   '$ Calcium 8.7 - 10.3 mg/dL 9.5  8.8  8.7   Total Protein  6.0 - 8.5 g/dL 5.4     Total Bilirubin 0.0 - 1.2 mg/dL 0.6     Alkaline Phos 44 - 121 IU/L 115     AST 0 - 40 IU/L 31     ALT 0 - 32 IU/L 11         Latest Ref Rng & Units 01/03/2022   12:08 PM 11/29/2021    2:10 AM 11/28/2021    3:08 AM  CBC  WBC 3.4 - 10.8 x10E3/uL 3.6  6.9  7.9   Hemoglobin 11.1 - 15.9 g/dL 11.4  10.1  9.8   Hematocrit 34.0 - 46.6 % 35.7  29.5  29.0   Platelets 150 - 450 x10E3/uL 88  77  68     Lipid Panel No results for input(s): "CHOL", "TRIG", "[place]", "VLDL", "HDL", "CHOLHDL", "LDLDIRECT" in the last 8760 hours.  External labs:   Medications and allergies   Allergies  Allergen Reactions   Metamucil [Psyllium] Other (See Comments)    Severe constipation   Trulicity [Dulaglutide]     KIDNEY INFECTION,INABILITY TO HAVE BOWEL MOVEMENTS,BLOOD IN URINE   Adhesive [Tape]     Latex, band aids  causes whelts and blisters   Hydrocodone-Acetaminophen Itching    Tolerates small/ infrequent doses   Latex Other (See Comments)    Band-aids causes whelts and blisters   Oxycontin [Oxycodone Hcl]     "FELT LIKE HEAD WAS GOING TO EXPLODE"   Ciprofloxacin Rash    Yeast infection and a severe rash    Codeine Itching and Rash   Other Palpitations    Steroids      "makes me feel like I'm having a heart attack. -     Current Outpatient Medications:    acetaminophen (TYLENOL) 500 MG tablet, Take 1,000 mg by mouth every 6 (six) hours as needed for moderate pain., Disp: , Rfl:    diclofenac (CATAFLAM) 50 MG tablet, Take 1 tablet (50 mg total) by mouth daily., Disp: , Rfl:    levothyroxine (SYNTHROID, LEVOTHROID) 175 MCG tablet, Take 175-262.5 mcg by mouth See admin instructions. Take 175 mcg by mouth daily, except on Tuesday take 262.5 mcg, Disp: , Rfl:    linaclotide (LINZESS) 290 MCG CAPS capsule, Take 290 mcg by mouth daily before breakfast., Disp: , Rfl:     losartan (COZAAR) 25 MG tablet, Take 1 tablet (25 mg total) by mouth every evening., Disp: 90 tablet, Rfl: 3   metoprolol tartrate (LOPRESSOR) 25 MG tablet, Take 1 tablet (25 mg total) by mouth 2 (two) times daily., Disp: 180 tablet, Rfl: 1   nitroGLYCERIN (NITROSTAT) 0.4 MG SL tablet, Place 1 tablet (0.4 mg total) under the tongue every 5 (five) minutes as needed for chest pain., Disp: 25 tablet, Rfl: 2   omeprazole (PRILOSEC) 40 MG capsule, Take 40 mg by mouth daily., Disp: , Rfl:    polyethylene glycol (MIRALAX / GLYCOLAX) 17 g packet, Take 17 g by mouth daily., Disp: , Rfl:    rosuvastatin (CRESTOR) 5 MG tablet, TAKE ONE TABLET BY MOUTH DAILY, Disp: 90 tablet, Rfl: 3   torsemide (DEMADEX) 20 MG tablet, Take 1 tablet (20 mg total) by mouth 2 (two) times daily., Disp: 60 tablet, Rfl: 3   Radiology:   CT angiogram chest 10/07/2021: There is mild aortic atherosclerosis, no dissection. Fusiform aneurysmal dilatation of the ascending aorta, maximal dimension 4.2 cm. Recommend annual imaging followup by CTA or MRA. Tiny hiatal hernia.  CXR 2 view 01/02/2022: There is elevation of  the left hemidiaphragm with left basilar atelectasis. There is gaseous distention of the stomach. This is increased compared to prior CT. No pleural effusion or pneumothorax identified. Cardiomediastinal silhouette is within normal limits. Patient is status post cardiac valve replacement.  Cardiac Studies:   Carotid artery duplex Mar 24, 2020: The bifurcation, internal, external and common carotid arteries reveal no evidence of significant stenosis, bilaterally. Antegrade right vertebral artery flow. Antegrade left vertebral artery flow.   Comparison(s): No significant change from prior study. 09/06/2021. Previously LV was mildly dilated at 5.7 cm now 6.0 cm.  Right & Left Heart Catheterization 11/08/21:  LV: 134/9, EDP 21 mmHg.  Ao 130/52, mean 97 mmHg.  Pulse pressure 82 mmHg.  Moderately elevated LVEDP. LM:  Large vessel: Smooth and normal. LAD: Large caliber vessel.  Gives origin to a moderate-sized D1 which immediately bifurcates into secondary branch.  Ostium has 85% focal stenosis.  D2 is moderate size, mild disease.  Mid segment of the LAD after D2 has 10 to 20% stenosis. CX: Gives origin to a small high OM1 followed by a moderate to large sized OM 2 and small OM 3 and OM 4.  AV groove circumflex is small to moderate-sized vessel with the ostium 10 to 20% stenosis. RCA: Dominant.  Mildly ectatic in the proximal segment.  Mild disease of 10 to 20% in the midsegment.  Slow flow in the right coronary artery. Ascending aortogram: There is moderate dilatation of the ascending aorta.  Aortic regurgitation is evident, severity was not assessed as it was a hand contrast injection.  Right heart: RA 12/9, mean 8 mmHg RV 32/3, EDP 16 mmHg PA 27/6, mean 16 mmHg PA saturation 80%. PW: Not obtained as I had difficulty in advancing the PA catheter to the wedge position due to tortuosity of the peripheral vessels. CO 11.19 and CI 4.57.  QP/QS 1.00.  Recommendation: Patient will be reassessed by Dr. Darcey Nora for consideration of [place] procedure.  D1 is small to moderate size, will discuss with him regarding revascularization versus medical therapy only.  Echocardiogram 01/13/2022: 1. Left ventricle cavity is normal in size and wall thickness. Abnormal septal wall motion due to postoperative valve. Normal LV systolic function with EF 56%. Normal diastolic filling pattern. 2. Left atrial cavity is mildly dilated. 3. Well seated bioprosthetic aortic valve (Inspiris resilia size 72m, per op note). Leaflets not well visualized. No significant aortic regurgitation. mean PG 17 mmHg slightly higher than intraoperative TEE. No significant stenosis. 4. Mild (Grade I) mitral regurgitation. 5. Mild tricuspid regurgitation. 6. The aortic root is normal. Mildly dilated ascending aorta at 4.1 cm. 7. No evidence of  pulmonary hypertension. 8. Compared to pre-operative TTE dates 11/22/2021, bioprosthetic valve is new. LV dilatation, mod-severe AI is resolved.  EKG:   EKG 01/02/2022: Normal sinus rhythm at rate of 68 bpm, left axis deviation, left anterior fascicular block.  Poor R wave progression, cannot exclude anteroseptal infarct old.  LVH.  Diffuse T wave abnormality.  Compared to 08/31/2021, no significant change.     Assessment     ICD-10-CM   1. S/P aortic valve replacement with 25 mm Inspiris Resilia bioprosthetic valve 11/24/2021  Z95.3 Fe+TIBC+Fer    CBC    2. Primary hypertension  I10 Fe+TIBC+Fer    CBC    3. OSA on CPAP  G47.33     4. Bilateral leg edema  R60.0 Pro b natriuretic peptide (BNP)      Medications Discontinued During This Encounter  Medication Reason   famotidine (PEPCID)  40 MG tablet      Meds ordered this encounter  Medications   torsemide (DEMADEX) 20 MG tablet    Sig: Take 1 tablet (20 mg total) by mouth 2 (two) times daily.    Dispense:  60 tablet    Refill:  3    Order Specific Question:   Supervising Provider    Answer:   Adrian Prows [[id number]]   Orders Placed This Encounter  Procedures   Pro b natriuretic peptide (BNP)   Fe+TIBC+Fer   CBC    Recommendations:   Bilateral leg edema S/P aortic valve replacement with 25 mm Inspiris Resilia bioprosthetic valve 11/24/2021 Start torsemide '20mg'$  twice daily, will recheck BMP at next visit Encouraged compression stockings and increasing walking as tolerated Previous labs reviewed, placed order for Pro-BNP to evaluate fluid status. If elevated would consider repeat echo. Low suspicion for lower extremity DVT given unilateral edema in BLE, no edness or warmth noted on physical exam  Primary hypertension Blood pressure is slightly elevated at today's visit. She does monitor her blood pressure at home and during cardiac rehab and readings are 120s/60s.   Stage IIIa chronic kidney disease has remained  stable. Continue low sodium diet and increasing exercise as tolerated  Follow-up in 4 weeks or sooner if needed.   Ernst Spell, AGNP-C 03/13/2022, 3:18 PM Office: [phone]

## 2022-03-14 LAB — IRON,TIBC AND FERRITIN PANEL
Ferritin: 28 ng/mL (ref 15–150)
Iron Saturation: 15 % (ref 15–55)
Iron: 65 ug/dL (ref 27–139)
Total Iron Binding Capacity: 440 ug/dL (ref 250–450)
UIBC: 375 ug/dL — ABNORMAL HIGH (ref 118–369)

## 2022-03-14 LAB — CBC
Hematocrit: 34.2 % (ref 34.0–46.6)
Hemoglobin: 11.1 g/dL (ref 11.1–15.9)
MCH: 27.3 pg (ref 26.6–33.0)
MCHC: 32.5 g/dL (ref 31.5–35.7)
MCV: 84 fL (ref 79–97)
Platelets: 87 10*3/uL — CL (ref 150–450)
RBC: 4.06 x10E6/uL (ref 3.77–5.28)
RDW: 14.8 % (ref 11.7–15.4)
WBC: 4.4 10*3/uL (ref 3.4–10.8)

## 2022-03-14 LAB — PRO B NATRIURETIC PEPTIDE: NT-Pro BNP: 370 pg/mL — ABNORMAL HIGH (ref 0–301)

## 2022-03-15 ENCOUNTER — Other Ambulatory Visit: Payer: Self-pay | Admitting: Cardiothoracic Surgery

## 2022-03-15 DIAGNOSIS — Z953 Presence of xenogenic heart valve: Secondary | ICD-10-CM

## 2022-03-16 ENCOUNTER — Ambulatory Visit
Admission: RE | Admit: 2022-03-16 | Discharge: 2022-03-16 | Disposition: A | Discharge status: Home / Self Care | Payer: HMO | Source: Ambulatory Visit | Attending: Cardiothoracic Surgery | Admitting: Cardiothoracic Surgery

## 2022-03-16 ENCOUNTER — Encounter: Payer: Self-pay | Admitting: Cardiothoracic Surgery

## 2022-03-16 ENCOUNTER — Ambulatory Visit: Payer: HMO | Admitting: Cardiothoracic Surgery

## 2022-03-16 VITALS — BP 125/73 | HR 70 | Resp 20 | Ht 68.0 in | Wt 312.0 lb

## 2022-03-16 DIAGNOSIS — Z09 Encounter for follow-up examination after completed treatment for conditions other than malignant neoplasm: Secondary | ICD-10-CM | POA: Diagnosis not present

## 2022-03-16 DIAGNOSIS — Z952 Presence of prosthetic heart valve: Secondary | ICD-10-CM | POA: Diagnosis not present

## 2022-03-16 DIAGNOSIS — J9811 Atelectasis: Secondary | ICD-10-CM | POA: Diagnosis not present

## 2022-03-16 DIAGNOSIS — I351 Nonrheumatic aortic (valve) insufficiency: Secondary | ICD-10-CM | POA: Diagnosis not present

## 2022-03-16 DIAGNOSIS — Z953 Presence of xenogenic heart valve: Secondary | ICD-10-CM | POA: Diagnosis not present

## 2022-03-16 MED ORDER — AMOXICILLIN 500 MG PO TABS: 500.0000 mg | ORAL_TABLET | Freq: Two times a day (BID) | ORAL | 4 refills | Status: DC

## 2022-03-16 NOTE — Progress Notes (Signed)
HPI Patient returns for final 72-monthfollow-up after aortic valve replacement for severe aortic insufficiency.  She had an x-ray today which shows persistent elevation of the left hemidiaphragm otherwise clear. Her echocardiogram performed in August showed normal functioning of the 25 mm pericardial aortic valve without aortic insufficiency. Patient is completing her cardiac rehab at her local hospital.  Her main complaint is fluid retention which is now improving since Dr. GEinar Giphas placed her on Demadex.    Current Outpatient Medications  Medication Sig Dispense Refill   acetaminophen (TYLENOL) 500 MG tablet Take 1,000 mg by mouth every 6 (six) hours as needed for moderate pain.     amoxicillin (AMOXIL) 500 MG tablet Take 1 tablet (500 mg total) by mouth 2 (two) times daily. Take 4 tablets by mouth 1 hour before dental cleaning or dental procedures 10 tablet 4   diclofenac (CATAFLAM) 50 MG tablet Take 1 tablet (50 mg total) by mouth daily.     levothyroxine (SYNTHROID, LEVOTHROID) 175 MCG tablet Take 175-262.5 mcg by mouth See admin instructions. Take 175 mcg by mouth daily, except on Tuesday take 262.5 mcg     linaclotide (LINZESS) 290 MCG CAPS capsule Take 290 mcg by mouth daily before breakfast.     losartan (COZAAR) 25 MG tablet Take 1 tablet (25 mg total) by mouth every evening. 90 tablet 3   metoprolol tartrate (LOPRESSOR) 25 MG tablet Take 1 tablet (25 mg total) by mouth 2 (two) times daily. 180 tablet 1   nitroGLYCERIN (NITROSTAT) 0.4 MG SL tablet Place 1 tablet (0.4 mg total) under the tongue every 5 (five) minutes as needed for chest pain. 25 tablet 2   omeprazole (PRILOSEC) 40 MG capsule Take 40 mg by mouth daily.     polyethylene glycol (MIRALAX / GLYCOLAX) 17 g packet Take 17 g by mouth daily.     rosuvastatin (CRESTOR) 5 MG tablet TAKE ONE TABLET BY MOUTH DAILY 90 tablet 3   torsemide (DEMADEX) 20 MG tablet Take 1 tablet (20 mg total) by mouth 2 (two) times daily. 60 tablet 3    No current facility-administered medications for this visit.     Review of Systems: Some episodic shortness of breath probably related to left hemidiaphragm dysfunction.  She is encouraged to use her abdominal accessory muscles to help when she has episodes of difficulty with taking a deep breath.  Her I-S inspiratory volume is 1.2 L  Physical Exam Blood pressure 125/73, pulse 70, resp. rate 20, height '5\' 8"'$  (1.727 m), weight (!) 312 lb (141.5 kg), SpO2 96 %.   Alert and comfortable Breath sounds clear slightly diminished at the left base Heart rhythm regular, soft flow murmur through aortic valve No JVD 1+ ankle edema  Diagnostic Tests: Chest x-ray personally reviewed and is noted above  Impression: Doing well now 3 months postop AVR for severe AI.  I called in a prescription for amoxicillin to be used for dental prophylaxis and explained to her the process for that.  Sternal healing is now complete and sternal restrictions are lifted.  Plan: Patient will be followed by Dr. GEinar Gip Patient understands importance of antibiotic dental prophylaxis with her prosthetic valve. We discussed the importance of heart healthy lifestyle after she finishes her cardiac rehab with 30 to 40 minutes of ambulation daily is the goal. PDahlia Byes MD [facility] ([phone]

## 2022-03-22 ENCOUNTER — Ambulatory Visit: Payer: HMO | Admitting: Podiatry

## 2022-03-22 ENCOUNTER — Encounter: Payer: Self-pay | Admitting: Podiatry

## 2022-03-22 DIAGNOSIS — L600 Ingrowing nail: Secondary | ICD-10-CM

## 2022-03-22 NOTE — Patient Instructions (Signed)

## 2022-03-22 NOTE — Progress Notes (Signed)
Subjective:   Patient ID: Joanna Alvarado, female   DOB: 66 y.o.   MRN: [id number]   HPI Patient presents with painful ingrown toenail right big toe medial border that has hard for her to work on herself reach or soak   ROS      Objective:  Physical Exam  Neurovascular status intact incurvated medial border right hallux painful when pressed     Assessment:  Ingrown toenail deformity right hallux medial border with pain no indication of infection     Plan:  H&P reviewed ingrown toenail she wants it corrected allowed her to read then signed consent form and went ahead today infiltrated the right hallux 60 mg like Marcaine mixture sterile prep done using sterile instrumentation remove the medial border exposed matrix applied phenol 3 applications 30 seconds followed by alcohol lavage sterile dressing gave instructions on soaks leave dressing on 24 hours take it off earlier if throbbing were to occur and encouraged her to call with any questions concerns which may arise

## 2022-03-30 ENCOUNTER — Encounter: Payer: Self-pay | Admitting: Internal Medicine

## 2022-04-05 ENCOUNTER — Telehealth: Payer: Self-pay | Admitting: Internal Medicine

## 2022-04-05 NOTE — Telephone Encounter (Signed)
Patient called states she received an open surgery in 6 and her surgeon released her 2 weeks ago. Wondering if it's safe for her to proceed with colonoscopy. Requesting a call back. Please call to advise.

## 2022-04-05 NOTE — Telephone Encounter (Signed)
Please clarify what this message means, I do not understand.

## 2022-04-06 NOTE — Telephone Encounter (Signed)
Spoke with pt and she is aware of Dr. Blanch Media recommendations.New recall entered in epic.

## 2022-04-06 NOTE — Telephone Encounter (Signed)
Patient called states she received an open heart surgery in June. States her surgeon release her 2 weeks ago. She is wondering is she can get a colonoscopy now. Wondering if it's safe for her. Requesting a call for a nurse to discuss things further. Please call to advise.

## 2022-04-06 NOTE — Telephone Encounter (Signed)
Okay to postpone routine follow-up surveillance colonoscopy 6 months. Thanks

## 2022-04-06 NOTE — Telephone Encounter (Signed)
Pt had open heart surgery in June. She was released by her surgeon 2 weeks ago. Asked her if she was feeling ok and she states she is, the doctor had to switch her diuretic and she is doing well now. She wants to know if Dr. Henrene Pastor feels she is ok to proceed with scheduling her colon or if she needs to wait a while. Please advise.

## 2022-04-10 ENCOUNTER — Ambulatory Visit: Payer: HMO

## 2022-04-10 VITALS — BP 112/66 | HR 64 | Temp 97.9°F | Resp 16 | Ht 68.0 in | Wt 312.0 lb

## 2022-04-10 DIAGNOSIS — I10 Essential (primary) hypertension: Secondary | ICD-10-CM

## 2022-04-10 DIAGNOSIS — R600 Localized edema: Secondary | ICD-10-CM

## 2022-04-10 MED ORDER — TORSEMIDE 20 MG PO TABS: 20.0000 mg | ORAL_TABLET | Freq: Two times a day (BID) | ORAL | 3 refills | Status: DC

## 2022-04-10 NOTE — Progress Notes (Signed)
Primary Physician/Referring:  Ronita Hipps, MD  Patient ID: Joanna Alvarado, female    DOB: 1955-08-26, 66 y.o.   MRN: [id number]  Chief Complaint  Patient presents with   Edema   Follow-up   HPI:    Joanna Alvarado  is a 66 y.o. female with a past medical history of hypertension, severe aortic regurgitation S/P AV replacement 11/24/2021 with bioprosthetic AV, controlled diabetes mellitus, sleep apnea using CPAP at night, GERD, hypercholesterolemia, hiatal hernia, fibromyalgia, hypothyroidism and chronic stable angina with normal coronary arteries/minimal coronary artery disease by angiography in April 2021.  She presents today for follow-up on lower leg edema, dyspnea, and weight gain.  At last office visit she was started on torsemide 20 mg twice daily.  Since she has lost 15 pounds and lower leg edema has significantly improved.  She is still having mild dyspnea on exertion that has improved since previous visit.  She is attending cardiac rehab and is having significant pain in right knee, this is not new and she has been seen by orthopedics. Otherwise, she denies chest pain, palpitations, leg edema, orthopnea, PND, syncope.   Past Surgical History:  Procedure Laterality Date   ABDOMINAL HYSTERECTOMY     AORTIC VALVE REPLACEMENT N/A 11/24/2021   Procedure: AORTIC VALVE REPLACEMENT (AVR) USING 25MM INSPIRIS RESILIA  AORTIC VALVE;  Surgeon: Dahlia Byes, MD;  Location: [facility];  Service: Open Heart Surgery;  Laterality: N/A;   APPENDECTOMY     CHOLECYSTECTOMY     COLONOSCOPY N/A 07/27/2015   Procedure: COLONOSCOPY;  Surgeon: Irene Shipper, MD;  Location: [facility];  Service: Endoscopy;  Laterality: N/A;   COLONOSCOPY WITH PROPOFOL N/A 04/28/2019   Procedure: COLONOSCOPY WITH PROPOFOL;  Surgeon: Irene Shipper, MD;  Location: [facility];  Service: Endoscopy;  Laterality: N/A;   EYE SURGERY     bilateral cataracts with lens implants   floater bone surgery     bone removed from  right hand   FOOT SURGERY     RIGHT   FOOT SURGERY Left    triple orthodesis   left foot surgery     removed tendon and placed pins in foot   LEFT HEART CATH AND CORONARY ANGIOGRAPHY N/A 09/26/2019   Procedure: LEFT HEART CATH AND CORONARY ANGIOGRAPHY;  Surgeon: Leonie Man, MD;  Location: [facility];  Service: Cardiovascular;  Laterality: N/A;   PLANTAR FASCIA SURGERY     both feet   POLYPECTOMY  04/28/2019   Procedure: POLYPECTOMY;  Surgeon: Irene Shipper, MD;  Location: [facility];  Service: Endoscopy;;   RIGHT HEART CATH N/A 02/15/2021   Procedure: RIGHT HEART CATH;  Surgeon: Nigel Mormon, MD;  Location: [facility];  Service: Cardiovascular;  Laterality: N/A;   RIGHT/LEFT HEART CATH AND CORONARY ANGIOGRAPHY N/A 11/08/2021   Procedure: RIGHT/LEFT HEART CATH AND CORONARY ANGIOGRAPHY;  Surgeon: Adrian Prows, MD;  Location: [facility];  Service: Cardiovascular;  Laterality: N/A;   TEE WITHOUT CARDIOVERSION N/A 11/24/2021   Procedure: TRANSESOPHAGEAL ECHOCARDIOGRAM (TEE);  Surgeon: Dahlia Byes, MD;  Location: [facility];  Service: Open Heart Surgery;  Laterality: N/A;   TONSILLECTOMY      Social History   Tobacco Use   Smoking status: Never   Smokeless tobacco: Never  Substance Use Topics   Alcohol use: Never   Marital Status: Married   ROS  Review of Systems  Cardiovascular:  Positive for dyspnea on exertion (stable, improving). Negative for chest  pain, leg swelling and orthopnea.  Musculoskeletal:  Positive for joint pain.  Gastrointestinal:  Negative for nausea and vomiting.   Objective  Blood pressure 112/66, pulse 64, temperature 97.9 F (36.6 C), temperature source Temporal, resp. rate 16, height '5\' 8"'$  (1.727 m), weight (!) 312 lb (141.5 kg), SpO2 95 %.     04/10/2022    2:20 PM 03/16/2022    1:53 PM 03/13/2022    2:36 PM  Vitals with BMI  Height '5\' 8"'$  '5\' 8"'$  '5\' 8"'$   Weight 312 lbs 312 lbs 327 lbs  BMI 47.45 29.79 89.21  Systolic 194  174 081  Diastolic 66 73 70  Pulse 64 70 57     Physical Exam Constitutional:      Appearance: She is morbidly obese.     Comments: Morbidly obese. No acute distress.  Neck:     Vascular: No JVD.  Cardiovascular:     Rate and Rhythm: Normal rate and regular rhythm.     Pulses: Intact distal pulses.          Radial pulses are 2+ on the right side and 2+ on the left side.       Popliteal pulses are 0 on the right side and 0 on the left side.       Dorsalis pedis pulses are 1+ on the right side and 1+ on the left side.       Posterior tibial pulses are 0 on the right side and 0 on the left side.     Heart sounds: Murmur heard.     Low-pitched harsh midsystolic murmur is present at the upper right sternal border. Soft murmur through AV     No gallop.  Pulmonary:     Effort: Pulmonary effort is normal.     Breath sounds: Normal breath sounds.  Abdominal:     General: Bowel sounds are normal.     Palpations: Abdomen is soft.     Comments: Large pannus  Musculoskeletal:     Right lower leg: No edema.     Left lower leg: No edema.    Laboratory examination:   Recent Labs    11/27/21 0450 11/28/21 0308 11/29/21 0210 01/03/22 1206  NA 138 139 140 143  K 3.7 4.1 4.0 4.0  CL 106 108 109 110*  CO2 '24 24 23 20  '$ GLUCOSE 107* 103* 108* 97  BUN 40* 33* 27* 16  CREATININE 1.32* 1.17* 1.01* 1.10*  CALCIUM 8.7* 8.7* 8.8* 9.5  GFRNONAA 44* 51* >60  --     CrCl cannot be calculated (Patient's most recent lab result is older than the maximum 21 days allowed.).     Latest Ref Rng & Units 01/03/2022   12:06 PM 11/29/2021    2:10 AM 11/28/2021    3:08 AM  CMP  Glucose 70 - 99 mg/dL 97  108  103   BUN 8 - 27 mg/dL 16  27  33   Creatinine 0.57 - 1.00 mg/dL 1.10  1.01  1.17   Sodium 134 - 144 mmol/L 143  140  139   Potassium 3.5 - 5.2 mmol/L 4.0  4.0  4.1   Chloride 96 - 106 mmol/L 110  109  108   CO2 20 - 29 mmol/L '20  23  24   '$ Calcium 8.7 - 10.3 mg/dL 9.5  8.8  8.7   Total Protein  6.0 - 8.5 g/dL 5.4     Total Bilirubin 0.0 - 1.2 mg/dL 0.6  Alkaline Phos 44 - 121 IU/L 115     AST 0 - 40 IU/L 31     ALT 0 - 32 IU/L 11         Latest Ref Rng & Units 03/13/2022    3:32 PM 01/03/2022   12:08 PM 11/29/2021    2:10 AM  CBC  WBC 3.4 - 10.8 x10E3/uL 4.4  3.6  6.9   Hemoglobin 11.1 - 15.9 g/dL 11.1  11.4  10.1   Hematocrit 34.0 - 46.6 % 34.2  35.7  29.5   Platelets 150 - 450 x10E3/uL 87  88  77     Lipid Panel No results for input(s): "CHOL", "TRIG", "[place]", "VLDL", "HDL", "CHOLHDL", "LDLDIRECT" in the last 8760 hours.  External labs:   Medications and allergies   Allergies  Allergen Reactions   Metamucil [Psyllium] Other (See Comments)    Severe constipation   Trulicity [Dulaglutide]     KIDNEY INFECTION,INABILITY TO HAVE BOWEL MOVEMENTS,BLOOD IN URINE   Adhesive [Tape]     Latex, band aids  causes whelts and blisters   Hydrocodone-Acetaminophen Itching    Tolerates small/ infrequent doses   Latex Other (See Comments)    Band-aids causes whelts and blisters   Oxycontin [Oxycodone Hcl]     "FELT LIKE HEAD WAS GOING TO EXPLODE"   Ciprofloxacin Rash    Yeast infection and a severe rash    Codeine Itching and Rash   Other Palpitations    Steroids      "makes me feel like I'm having a heart attack. -     Current Outpatient Medications:    acetaminophen (TYLENOL) 500 MG tablet, Take 1,000 mg by mouth every 6 (six) hours as needed for moderate pain., Disp: , Rfl:    amoxicillin (AMOXIL) 500 MG tablet, Take 1 tablet (500 mg total) by mouth 2 (two) times daily. Take 4 tablets by mouth 1 hour before dental cleaning or dental procedures, Disp: 10 tablet, Rfl: 4   diclofenac (CATAFLAM) 50 MG tablet, Take 1 tablet (50 mg total) by mouth daily., Disp: , Rfl:    levothyroxine (SYNTHROID, LEVOTHROID) 175 MCG tablet, Take 175-262.5 mcg by mouth See admin instructions. Take 175 mcg by mouth daily, except on Tuesday take 262.5 mcg, Disp: , Rfl:    linaclotide  (LINZESS) 290 MCG CAPS capsule, Take 290 mcg by mouth daily before breakfast., Disp: , Rfl:    losartan (COZAAR) 25 MG tablet, Take 1 tablet (25 mg total) by mouth every evening., Disp: 90 tablet, Rfl: 3   metoprolol tartrate (LOPRESSOR) 25 MG tablet, Take 1 tablet (25 mg total) by mouth 2 (two) times daily., Disp: 180 tablet, Rfl: 1   nitroGLYCERIN (NITROSTAT) 0.4 MG SL tablet, Place 1 tablet (0.4 mg total) under the tongue every 5 (five) minutes as needed for chest pain., Disp: 25 tablet, Rfl: 2   omeprazole (PRILOSEC) 40 MG capsule, Take 40 mg by mouth daily., Disp: , Rfl:    polyethylene glycol (MIRALAX / GLYCOLAX) 17 g packet, Take 17 g by mouth daily., Disp: , Rfl:    rosuvastatin (CRESTOR) 5 MG tablet, TAKE ONE TABLET BY MOUTH DAILY, Disp: 90 tablet, Rfl: 3   torsemide (DEMADEX) 20 MG tablet, Take 1 tablet (20 mg total) by mouth 2 (two) times daily., Disp: 90 tablet, Rfl: 3   Radiology:   CT angiogram chest 10/07/2021: There is mild aortic atherosclerosis, no dissection. Fusiform aneurysmal dilatation of the ascending aorta, maximal dimension 4.2 cm. Recommend annual imaging  followup by CTA or MRA. Tiny hiatal hernia.  CXR 2 view 01/02/2022: There is elevation of the left hemidiaphragm with left basilar atelectasis. There is gaseous distention of the stomach. This is increased compared to prior CT. No pleural effusion or pneumothorax identified. Cardiomediastinal silhouette is within normal limits. Patient is status post cardiac valve replacement.  Cardiac Studies:   Carotid artery duplex 2020/03/20: The bifurcation, internal, external and common carotid arteries reveal no evidence of significant stenosis, bilaterally. Antegrade right vertebral artery flow. Antegrade left vertebral artery flow.   Comparison(s): No significant change from prior study. 09/06/2021. Previously LV was mildly dilated at 5.7 cm now 6.0 cm.  Right & Left Heart Catheterization 11/08/21:  LV: 134/9, EDP 21  mmHg.  Ao 130/52, mean 97 mmHg.  Pulse pressure 82 mmHg.  Moderately elevated LVEDP. LM: Large vessel: Smooth and normal. LAD: Large caliber vessel.  Gives origin to a moderate-sized D1 which immediately bifurcates into secondary branch.  Ostium has 85% focal stenosis.  D2 is moderate size, mild disease.  Mid segment of the LAD after D2 has 10 to 20% stenosis. CX: Gives origin to a small high OM1 followed by a moderate to large sized OM 2 and small OM 3 and OM 4.  AV groove circumflex is small to moderate-sized vessel with the ostium 10 to 20% stenosis. RCA: Dominant.  Mildly ectatic in the proximal segment.  Mild disease of 10 to 20% in the midsegment.  Slow flow in the right coronary artery. Ascending aortogram: There is moderate dilatation of the ascending aorta.  Aortic regurgitation is evident, severity was not assessed as it was a hand contrast injection.  Right heart: RA 12/9, mean 8 mmHg RV 32/3, EDP 16 mmHg PA 27/6, mean 16 mmHg PA saturation 80%. PW: Not obtained as I had difficulty in advancing the PA catheter to the wedge position due to tortuosity of the peripheral vessels. CO 11.19 and CI 4.57.  QP/QS 1.00.  Recommendation: Patient will be reassessed by Dr. Darcey Nora for consideration of [place] procedure.  D1 is small to moderate size, will discuss with him regarding revascularization versus medical therapy only.  Echocardiogram 01/13/2022: 1. Left ventricle cavity is normal in size and wall thickness. Abnormal septal wall motion due to postoperative valve. Normal LV systolic function with EF 56%. Normal diastolic filling pattern. 2. Left atrial cavity is mildly dilated. 3. Well seated bioprosthetic aortic valve (Inspiris resilia size 71m, per op note). Leaflets not well visualized. No significant aortic regurgitation. mean PG 17 mmHg slightly higher than intraoperative TEE. No significant stenosis. 4. Mild (Grade I) mitral regurgitation. 5. Mild tricuspid regurgitation. 6. The  aortic root is normal. Mildly dilated ascending aorta at 4.1 cm. 7. No evidence of pulmonary hypertension. 8. Compared to pre-operative TTE dates 11/22/2021, bioprosthetic valve is new. LV dilatation, mod-severe AI is resolved.  EKG:   EKG 01/02/2022: Normal sinus rhythm at rate of 68 bpm, left axis deviation, left anterior fascicular block.  Poor R wave progression, cannot exclude anteroseptal infarct old.  LVH.  Diffuse T wave abnormality.  Compared to 08/31/2021, no significant change.     Assessment     ICD-10-CM   1. Bilateral leg edema  R60.0     2. Primary hypertension  IG88Basic Metabolic Panel (BMET)      Medications Discontinued During This Encounter  Medication Reason   torsemide (DEMADEX) 20 MG tablet Reorder      Meds ordered this encounter  Medications   torsemide (DEMADEX) 20 MG  tablet    Sig: Take 1 tablet (20 mg total) by mouth 2 (two) times daily.    Dispense:  90 tablet    Refill:  3    Order Specific Question:   Supervising Provider    Answer:   Adrian Prows [[id number]]   Orders Placed This Encounter  Procedures   Basic Metabolic Panel (BMET)    Recommendations:   Bilateral leg edema She has diuresed well on torsemide 20 mg twice daily, will continue this. We will check BMP to assess electrolytes and kidney function given significant weight loss with diuresis.  Primary hypertension Blood pressure is well controlled.  She does monitor her blood pressure at home and during cardiac rehab and readings are 110-120s/70s. Continue low sodium diet less than '2000mg'$  daily and increasing exercise as tolerated. She does have difficulty with walking due to right knee pain, she currently ambulates with cane. She does follow with ortho and will need to undergo right knee arthoplasty at some point. Discussed the importance of weight loss.  Follow-up at next scheduled office visit.   Ernst Spell, AGNP-C 04/10/2022, 2:53 PM Office: [phone]

## 2022-04-11 LAB — BASIC METABOLIC PANEL
BUN/Creatinine Ratio: 15 (ref 12–28)
BUN: 16 mg/dL (ref 8–27)
CO2: 22 mmol/L (ref 20–29)
Calcium: 9.5 mg/dL (ref 8.7–10.3)
Chloride: 109 mmol/L — ABNORMAL HIGH (ref 96–106)
Creatinine, Ser: 1.09 mg/dL — ABNORMAL HIGH (ref 0.57–1.00)
Glucose: 97 mg/dL (ref 70–99)
Potassium: 4.1 mmol/L (ref 3.5–5.2)
Sodium: 146 mmol/L — ABNORMAL HIGH (ref 134–144)
eGFR: 56 mL/min/{1.73_m2} — ABNORMAL LOW (ref >59)

## 2022-04-14 ENCOUNTER — Other Ambulatory Visit: Payer: Self-pay

## 2022-04-14 DIAGNOSIS — D696 Thrombocytopenia, unspecified: Secondary | ICD-10-CM

## 2022-04-16 NOTE — Progress Notes (Unsigned)
[facility]  [address] [place],  [place]  [address] [phone]  Clinic Day:  10/13/2020  Referring physician: Ronita Hipps, MD  HISTORY OF PRESENT ILLNESS:  The patient is a 66 y.o. female  who I recently began seeing for thrombocytopenia.  Of note, past labs showed one of her liver enzymes being elevated.  Despite this, recent scans did not show any underlying liver disease and splenomegaly behind her thrombocytopenia.  Furthermore, prior labs did not reveal any type of nutritional deficiency factoring into her thrombocytopenia.  The patient comes in to reassess her platelet count.  Since her last visit, the patient has been doing fairly well.  She continues to deny having any underlying bleeding/bruising issues which concern her for severe thrombocytopenia being present.    PHYSICAL EXAM:  There were no vitals taken for this visit. Wt Readings from Last 3 Encounters:  04/10/22 (!) 312 lb (141.5 kg)  03/16/22 (!) 312 lb (141.5 kg)  03/13/22 (!) 327 lb (148.3 kg)   There is no height or weight on file to calculate BMI. Performance status (ECOG): 1 - Symptomatic but completely ambulatory Physical Exam Constitutional:      Appearance: Normal appearance. She is not ill-appearing.  HENT:     Mouth/Throat:     Mouth: Mucous membranes are moist.     Pharynx: Oropharynx is clear. No oropharyngeal exudate or posterior oropharyngeal erythema.  Cardiovascular:     Rate and Rhythm: Normal rate and regular rhythm.     Heart sounds: No murmur heard.    No friction rub. No gallop.  Pulmonary:     Effort: Pulmonary effort is normal. No respiratory distress.     Breath sounds: Normal breath sounds. No wheezing, rhonchi or rales.  Abdominal:     General: Bowel sounds are normal. There is no distension.     Palpations: Abdomen is soft. There is no mass.     Tenderness: There is no abdominal tenderness.  Musculoskeletal:        General: No swelling.      Right lower leg: No edema.     Left lower leg: No edema.  Lymphadenopathy:     Cervical: No cervical adenopathy.     Upper Body:     Right upper body: No supraclavicular or axillary adenopathy.     Left upper body: No supraclavicular or axillary adenopathy.     Lower Body: No right inguinal adenopathy. No left inguinal adenopathy.  Skin:    General: Skin is warm.     Coloration: Skin is not jaundiced.     Findings: No lesion or rash.  Neurological:     General: No focal deficit present.     Mental Status: She is alert and oriented to person, place, and time. Mental status is at baseline.  Psychiatric:        Mood and Affect: Mood normal.        Behavior: Behavior normal.        Thought Content: Thought content normal.    LABS:    Latest Reference Range & Units 10/13/21 00:00  WBC  3.7 (E)  RBC 3.87 - 5.11  4.18 (E)  Hemoglobin 12.0 - 16.0  12.6 (E)  HCT 36 - 46  39 (E)  Platelets 150 - 400 K/uL 88 ! (E)  !: Data is abnormal (E): External lab result   Latest Reference Range & Units 10/13/21 13:49  TSH 0.350 - 4.500 uIU/mL 0.850  ASSESSMENT & PLAN:  A 66 y.o. female with thrombocythemia.  Her platelet count of 88 today is not much different than what it has been in the recent past.  There remains no obvious etiology behind her mild low platelet count.  She is clinically behaving as if she has ITP.  As long as her platelet count remains well above 20,000 and she has no underlying bleeding/bruising issues, her thrombocytopenia continue to be followed conservatively.  I will see her back in 6 months for repeat clinical assessment.  The patient understands all the plans discussed today and is in agreement with them.  Joanna Alvarado Critchley, MD

## 2022-04-17 ENCOUNTER — Other Ambulatory Visit: Payer: Self-pay | Admitting: Oncology

## 2022-04-17 ENCOUNTER — Inpatient Hospital Stay: Payer: HMO | Attending: Oncology | Admitting: Oncology

## 2022-04-17 ENCOUNTER — Inpatient Hospital Stay: Payer: HMO

## 2022-04-17 ENCOUNTER — Telehealth: Payer: Self-pay

## 2022-04-17 ENCOUNTER — Telehealth: Payer: Self-pay | Admitting: Oncology

## 2022-04-17 VITALS — BP 134/77 | HR 96 | Temp 98.1°F | Resp 16 | Ht 68.0 in | Wt 313.2 lb

## 2022-04-17 DIAGNOSIS — D696 Thrombocytopenia, unspecified: Secondary | ICD-10-CM

## 2022-04-17 DIAGNOSIS — D539 Nutritional anemia, unspecified: Secondary | ICD-10-CM

## 2022-04-17 DIAGNOSIS — D509 Iron deficiency anemia, unspecified: Secondary | ICD-10-CM | POA: Insufficient documentation

## 2022-04-17 DIAGNOSIS — D508 Other iron deficiency anemias: Secondary | ICD-10-CM | POA: Diagnosis not present

## 2022-04-17 LAB — CMP (CANCER CENTER ONLY)
ALT: 17 U/L (ref 0–44)
AST: 34 U/L (ref 15–41)
Albumin: 3.2 g/dL — ABNORMAL LOW (ref 3.5–5.0)
Alkaline Phosphatase: 109 U/L (ref 38–126)
Anion gap: 12 (ref 5–15)
BUN: 20 mg/dL (ref 8–23)
CO2: 24 mmol/L (ref 22–32)
Calcium: 9.4 mg/dL (ref 8.9–10.3)
Chloride: 111 mmol/L (ref 98–111)
Creatinine: 1.27 mg/dL — ABNORMAL HIGH (ref 0.44–1.00)
GFR, Estimated: 46 mL/min — ABNORMAL LOW (ref >60)
Glucose, Bld: 101 mg/dL — ABNORMAL HIGH (ref 70–99)
Potassium: 3.6 mmol/L (ref 3.5–5.1)
Sodium: 147 mmol/L — ABNORMAL HIGH (ref 135–145)
Total Bilirubin: 1 mg/dL (ref 0.3–1.2)
Total Protein: 5.9 g/dL — ABNORMAL LOW (ref 6.5–8.1)

## 2022-04-17 LAB — FERRITIN: Ferritin: 12 ng/mL (ref 11–307)

## 2022-04-17 LAB — IRON AND TIBC
Iron: 75 ug/dL (ref 28–170)
Saturation Ratios: 15 % (ref 10.4–31.8)
TIBC: 486 ug/dL — ABNORMAL HIGH (ref 250–450)
UIBC: 411 ug/dL

## 2022-04-17 NOTE — Telephone Encounter (Signed)
Dr Bobby Rumpf reviewed pt's labs. He recommends she receive IV iron. I notified pt that once insurance approves, that the schedulers will call her to set up appt's. She verbalized understanding.   Latest Reference Range & Units 04/17/22 11:44  Iron 28 - 170 ug/dL 75  UIBC ug/dL 411  TIBC 250 - 450 ug/dL 486 (H)  Saturation Ratios 10.4 - 31.8 % 15  Ferritin 11 - 307 ng/mL 12

## 2022-04-17 NOTE — Telephone Encounter (Signed)
04/17/22 next appt scheduled and confirmed with patient

## 2022-04-18 ENCOUNTER — Telehealth: Payer: Self-pay | Admitting: Oncology

## 2022-04-18 NOTE — Telephone Encounter (Signed)
Patient has been scheduled. Aware of appt date and time   Scheduling Message Entered by [place], AMY W on 04/17/2022 at  3:15 PM Priority: [place] (90)  Department: [facility]  Provider: Marice Potter, MD  Appointment Notes:  Dr Bobby Rumpf recommends IV iron  Scheduling Notes:

## 2022-04-20 ENCOUNTER — Telehealth: Payer: Self-pay

## 2022-04-20 NOTE — Telephone Encounter (Signed)
Patient called and wants to know if it is ok to take tumeric, glucosamine, and to use Voltaren gel. Please advise.

## 2022-04-24 MED FILL — Ferumoxytol Inj 510 MG/17ML (30 MG/ML) (Elemental Fe): INTRAVENOUS | Qty: 17 | Status: AC

## 2022-04-25 ENCOUNTER — Inpatient Hospital Stay: Payer: HMO

## 2022-04-25 VITALS — BP 120/57 | HR 57 | Temp 98.4°F | Resp 20 | Ht 68.0 in | Wt 308.5 lb

## 2022-04-25 DIAGNOSIS — D696 Thrombocytopenia, unspecified: Secondary | ICD-10-CM | POA: Diagnosis not present

## 2022-04-25 DIAGNOSIS — D509 Iron deficiency anemia, unspecified: Secondary | ICD-10-CM

## 2022-04-25 MED ORDER — SODIUM CHLORIDE 0.9 % IV SOLN
510.0000 mg | Freq: Once | INTRAVENOUS | Status: AC
  Administered 2022-04-25: 510 mg via INTRAVENOUS
  Filled 2022-04-25: qty 510

## 2022-04-25 MED ORDER — ACETAMINOPHEN 325 MG PO TABS
650.0000 mg | ORAL_TABLET | Freq: Once | ORAL | Status: AC
  Administered 2022-04-25: 650 mg via ORAL
  Filled 2022-04-25: qty 2

## 2022-04-25 MED ORDER — FAMOTIDINE IN NACL 20-0.9 MG/50ML-% IV SOLN
20.0000 mg | Freq: Once | INTRAVENOUS | Status: AC
  Administered 2022-04-25: 20 mg via INTRAVENOUS
  Filled 2022-04-25: qty 50

## 2022-04-25 MED ORDER — SODIUM CHLORIDE 0.9 % IV SOLN: Freq: Once | INTRAVENOUS | Status: AC

## 2022-04-25 NOTE — Patient Instructions (Signed)
Iron Deficiency Anemia, Adult  Iron deficiency anemia is a condition in which the concentration of red blood cells or hemoglobin in the blood is below normal because of too little iron. Hemoglobin is a substance in red blood cells that carries oxygen to the body's tissues. When the concentration of red blood cells or hemoglobin is too low, not enough oxygen reaches these tissues. Iron deficiency anemia is usually long-lasting, and it develops over time. It may or may not cause symptoms. It is a common type of anemia. What are the causes? This condition may be caused by: Not enough iron in the diet. Abnormal absorption in the gut. Blood loss. What increases the risk? You are more likely to develop this condition if you get menstrual periods (menstruate) or are pregnant. What are the signs or symptoms? Symptoms of this condition may include: Pale skin, lips, and nail beds. Weakness, dizziness, and getting tired easily. Shortness of breath when moving or exercising. Cold hands or feet. Mild anemia may not cause any symptoms. How is this diagnosed? This condition is diagnosed based on: Your medical history. A physical exam. Blood tests. How is this treated? This condition is treated by correcting the cause of your iron deficiency. Treatment may involve: Adding iron-rich foods to your diet. Taking iron supplements. If you are pregnant or breastfeeding, you may need to take extra iron because your normal diet usually does not provide the amount of iron that you need. Increasing vitamin C intake. Vitamin C helps your body absorb iron. Your health care provider may recommend that you take iron supplements along with a glass of orange juice or a vitamin C supplement. Medicines to make heavy menstrual flow lighter. Surgery or additional testing procedures to determine the cause of your anemia. You may need repeat blood tests to determine whether treatment is working. If the treatment does not  seem to be working, you may need more tests. Follow these instructions at home: Medicines Take over-the-counter and prescription medicines only as told by your health care provider. This includes iron supplements and vitamins. This is important because too much iron can be harmful. For the best iron absorption, you should take iron supplements when your stomach is empty. If you cannot tolerate them on an empty stomach, you may need to take them with food. Do not drink milk or take antacids at the same time as your iron supplements. Milk and antacids may interfere with how your body absorbs iron. Iron supplements may turn stool (feces) a darker color and it may appear black. If you cannot tolerate taking iron supplements by mouth, talk with your health care provider about taking them through an IV or through an injection into a muscle. Eating and drinking Talk with your health care provider before changing your diet. Your provider may recommend that you eat foods that contain a lot of iron, such as: Liver. Low-fat (lean) beef. Breads and cereals that have iron added to them (are fortified). Eggs. Dried fruit. Dark green, leafy vegetables. To help your body use the iron from iron-rich foods, eat those foods at the same time as fresh fruits and vegetables that are high in vitamin C. Foods that are high in vitamin C include: Oranges. Peppers. Tomatoes. Mangoes. Managing constipation If you are taking an iron supplement, it may cause constipation. To prevent or treat constipation, you may need to: Drink enough fluid to keep your urine pale yellow. Take over-the-counter or prescription medicines. Eat foods that are high in fiber, such   as beans, whole grains, and fresh fruits and vegetables. Limit foods that are high in fat and processed sugars, such as fried or sweet foods. General instructions Return to your normal activities as told by your health care provider. Ask your health care provider  what activities are safe for you. Keep all follow-up visits. Contact a health care provider if: You feel nauseous or you vomit. You feel weak. You become light-headed when getting up from a sitting or lying down position. You have unexplained sweating. You develop symptoms of constipation. You have a heaviness in your chest. You have trouble breathing with physical activity. Get help right away if: You faint. If this happens, do not drive yourself to the hospital. You have an irregular or rapid heartbeat. Summary Iron deficiency anemia is a condition in which the concentration of red blood cells or hemoglobin in the blood is below normal because of too little iron. This condition is treated by correcting the cause of your iron deficiency. Take over-the-counter and prescription medicines only as told by your health care provider. This includes iron supplements and vitamins. To help your body use the iron from iron-rich foods, eat those foods at the same time as fresh fruits and vegetables that are high in vitamin C. Seek medical help if you have signs or symptoms of worsening anemia. This information is not intended to replace advice given to you by your health care provider. Make sure you discuss any questions you have with your health care provider. Document Revised: 06/29/2021 Document Reviewed: 06/29/2021 [employer] Patient Education  2023 [employer] Iron-Rich Diet  Iron is a mineral that helps your body produce hemoglobin. Hemoglobin is a protein in red blood cells that carries oxygen to your body's tissues. Eating too little iron may cause you to feel weak and tired, and it can increase your risk of infection. Iron is naturally found in many foods, and many foods have iron added to them (are iron-fortified). You may need to follow an iron-rich diet if you do not have enough iron in your body due to certain medical conditions. The amount of iron that you need each day depends on your  age, your sex, and any medical conditions you have. Follow instructions from your health care provider or a dietitian about how much iron you should eat each day. What are tips for following this plan? Reading food labels Check food labels to see how many milligrams (mg) of iron are in each serving. Cooking Cook foods in pots and pans that are made from iron. Take these steps to make it easier for your body to absorb iron from certain foods: Soak beans overnight before cooking. Soak whole grains overnight and drain them before using. Ferment flours before baking, such as by using yeast in bread dough. Meal planning When you eat foods that contain iron, you should eat them with foods that are high in vitamin C. These include oranges, peppers, tomatoes, potatoes, and mangoes. Vitamin C helps your body absorb iron. Certain foods and drinks prevent your body from absorbing iron properly. Avoid eating these foods in the same meal as iron-rich foods or with iron supplements. These foods include: Coffee, black tea, and red wine. Milk, dairy products, and foods that are high in calcium. Beans and soybeans. Whole grains. General information Take iron supplements only as told by your health care provider. An overdose of iron can be life-threatening. If you were prescribed iron supplements, take them with orange juice or a vitamin C   supplement. When you eat iron-fortified foods or take an iron supplement, you should also eat foods that naturally contain iron, such as meat, poultry, and fish. Eating naturally iron-rich foods helps your body absorb the iron that is added to other foods or contained in a supplement. Iron from animal sources is better absorbed than iron from plant sources. What foods should I eat? Fruits Prunes. Raisins. Eat fruits high in vitamin C, such as oranges, grapefruits, and strawberries, with iron-rich foods. Vegetables Spinach (cooked). Green peas. Broccoli. Fermented  vegetables. Eat vegetables high in vitamin C, such as leafy greens, potatoes, bell peppers, and tomatoes, with iron-rich foods. Grains Iron-fortified breakfast cereal. Iron-fortified whole-wheat bread. Enriched rice. Sprouted grains. Meats and other proteins Beef liver. Beef. Turkey. Chicken. Oysters. Shrimp. Tuna. Sardines. Chickpeas. Nuts. Tofu. Pumpkin seeds. Beverages Tomato juice. Fresh orange juice. Prune juice. Hibiscus tea. Iron-fortified instant breakfast shakes. Sweets and desserts Blackstrap molasses. Seasonings and condiments Tahini. Fermented soy sauce. Other foods Wheat germ. The items listed above may not be a complete list of recommended foods and beverages. Contact a dietitian for more information. What foods should I limit? These are foods that should be limited while eating iron-rich foods as they can reduce the absorption of iron in your body. Grains Whole grains. Bran cereal. Bran flour. Meats and other proteins Soybeans. Products made from soy protein. Black beans. Lentils. Mung beans. Split peas. Dairy Milk. Cream. Cheese. Yogurt. Cottage cheese. Beverages Coffee. Black tea. Red wine. Sweets and desserts Cocoa. Chocolate. Ice cream. Seasonings and condiments Basil. Oregano. Large amounts of parsley. The items listed above may not be a complete list of foods and beverages you should limit. Contact a dietitian for more information. Summary Iron is a mineral that helps your body produce hemoglobin. Hemoglobin is a protein in red blood cells that carries oxygen to your body's tissues. Iron is naturally found in many foods, and many foods have iron added to them (are iron-fortified). When you eat foods that contain iron, you should eat them with foods that are high in vitamin C. Vitamin C helps your body absorb iron. Certain foods and drinks prevent your body from absorbing iron properly, such as whole grains and dairy products. You should avoid eating these foods  in the same meal as iron-rich foods or with iron supplements. This information is not intended to replace advice given to you by your health care provider. Make sure you discuss any questions you have with your health care provider. Document Revised: 05/03/2020 Document Reviewed: 05/03/2020 [employer] Patient Education  2023 [employer]  

## 2022-04-26 NOTE — Telephone Encounter (Signed)
Patient is aware 

## 2022-05-01 MED FILL — Ferumoxytol Inj 510 MG/17ML (30 MG/ML) (Elemental Fe): INTRAVENOUS | Qty: 17 | Status: AC

## 2022-05-02 ENCOUNTER — Inpatient Hospital Stay: Payer: HMO

## 2022-05-02 VITALS — BP 120/55 | HR 64 | Temp 98.5°F | Resp 20 | Ht 68.0 in | Wt 305.5 lb

## 2022-05-02 DIAGNOSIS — D509 Iron deficiency anemia, unspecified: Secondary | ICD-10-CM

## 2022-05-02 DIAGNOSIS — D696 Thrombocytopenia, unspecified: Secondary | ICD-10-CM | POA: Diagnosis not present

## 2022-05-02 MED ORDER — SODIUM CHLORIDE 0.9 % IV SOLN: Freq: Once | INTRAVENOUS | Status: AC

## 2022-05-02 MED ORDER — ACETAMINOPHEN 325 MG PO TABS
650.0000 mg | ORAL_TABLET | Freq: Once | ORAL | Status: AC
  Administered 2022-05-02: 650 mg via ORAL
  Filled 2022-05-02: qty 2

## 2022-05-02 MED ORDER — SODIUM CHLORIDE 0.9 % IV SOLN
510.0000 mg | Freq: Once | INTRAVENOUS | Status: AC
  Administered 2022-05-02: 510 mg via INTRAVENOUS
  Filled 2022-05-02: qty 510

## 2022-05-02 MED ORDER — FAMOTIDINE IN NACL 20-0.9 MG/50ML-% IV SOLN
20.0000 mg | Freq: Once | INTRAVENOUS | Status: AC
  Administered 2022-05-02: 20 mg via INTRAVENOUS
  Filled 2022-05-02: qty 50

## 2022-05-02 NOTE — Patient Instructions (Signed)
Iron Deficiency Anemia, Adult  Iron deficiency anemia is a condition in which the concentration of red blood cells or hemoglobin in the blood is below normal because of too little iron. Hemoglobin is a substance in red blood cells that carries oxygen to the body's tissues. When the concentration of red blood cells or hemoglobin is too low, not enough oxygen reaches these tissues. Iron deficiency anemia is usually long-lasting, and it develops over time. It may or may not cause symptoms. It is a common type of anemia. What are the causes? This condition may be caused by: Not enough iron in the diet. Abnormal absorption in the gut. Blood loss. What increases the risk? You are more likely to develop this condition if you get menstrual periods (menstruate) or are pregnant. What are the signs or symptoms? Symptoms of this condition may include: Pale skin, lips, and nail beds. Weakness, dizziness, and getting tired easily. Shortness of breath when moving or exercising. Cold hands or feet. Mild anemia may not cause any symptoms. How is this diagnosed? This condition is diagnosed based on: Your medical history. A physical exam. Blood tests. How is this treated? This condition is treated by correcting the cause of your iron deficiency. Treatment may involve: Adding iron-rich foods to your diet. Taking iron supplements. If you are pregnant or breastfeeding, you may need to take extra iron because your normal diet usually does not provide the amount of iron that you need. Increasing vitamin C intake. Vitamin C helps your body absorb iron. Your health care provider may recommend that you take iron supplements along with a glass of orange juice or a vitamin C supplement. Medicines to make heavy menstrual flow lighter. Surgery or additional testing procedures to determine the cause of your anemia. You may need repeat blood tests to determine whether treatment is working. If the treatment does not  seem to be working, you may need more tests. Follow these instructions at home: Medicines Take over-the-counter and prescription medicines only as told by your health care provider. This includes iron supplements and vitamins. This is important because too much iron can be harmful. For the best iron absorption, you should take iron supplements when your stomach is empty. If you cannot tolerate them on an empty stomach, you may need to take them with food. Do not drink milk or take antacids at the same time as your iron supplements. Milk and antacids may interfere with how your body absorbs iron. Iron supplements may turn stool (feces) a darker color and it may appear black. If you cannot tolerate taking iron supplements by mouth, talk with your health care provider about taking them through an IV or through an injection into a muscle. Eating and drinking Talk with your health care provider before changing your diet. Your provider may recommend that you eat foods that contain a lot of iron, such as: Liver. Low-fat (lean) beef. Breads and cereals that have iron added to them (are fortified). Eggs. Dried fruit. Dark green, leafy vegetables. To help your body use the iron from iron-rich foods, eat those foods at the same time as fresh fruits and vegetables that are high in vitamin C. Foods that are high in vitamin C include: Oranges. Peppers. Tomatoes. Mangoes. Managing constipation If you are taking an iron supplement, it may cause constipation. To prevent or treat constipation, you may need to: Drink enough fluid to keep your urine pale yellow. Take over-the-counter or prescription medicines. Eat foods that are high in fiber, such   as beans, whole grains, and fresh fruits and vegetables. Limit foods that are high in fat and processed sugars, such as fried or sweet foods. General instructions Return to your normal activities as told by your health care provider. Ask your health care provider  what activities are safe for you. Keep all follow-up visits. Contact a health care provider if: You feel nauseous or you vomit. You feel weak. You become light-headed when getting up from a sitting or lying down position. You have unexplained sweating. You develop symptoms of constipation. You have a heaviness in your chest. You have trouble breathing with physical activity. Get help right away if: You faint. If this happens, do not drive yourself to the hospital. You have an irregular or rapid heartbeat. Summary Iron deficiency anemia is a condition in which the concentration of red blood cells or hemoglobin in the blood is below normal because of too little iron. This condition is treated by correcting the cause of your iron deficiency. Take over-the-counter and prescription medicines only as told by your health care provider. This includes iron supplements and vitamins. To help your body use the iron from iron-rich foods, eat those foods at the same time as fresh fruits and vegetables that are high in vitamin C. Seek medical help if you have signs or symptoms of worsening anemia. This information is not intended to replace advice given to you by your health care provider. Make sure you discuss any questions you have with your health care provider. Document Revised: 06/29/2021 Document Reviewed: 06/29/2021 [employer] Patient Education  2023 [employer]  

## 2022-05-16 NOTE — Progress Notes (Unsigned)
[facility] [address] [place]. [place] [address] [phone]       OFFICE FOLLOW UP NOTE  Ms. [name] Date of Birth:  11-May-1956 Medical Record Number:  [id number]   Reason for visit: CPAP follow-up    SUBJECTIVE:   CHIEF COMPLAINT:  No chief complaint on file.   HPI:    Update 05/17/2022 JM: Patient returns for CPAP compliance visit.       History provided for reference purposes only Update 11/15/2021 JM: patient returns for CPAP follow up visit.  She is accompanied by her husband.  She received her new CPAP machine back in April. Review of compliance report showed 29/30 usage days and 28 days greater than 4 hours for 93% compliance. Average usage 9 hrs and 18 minutes. Residual AHI 0.5.  Leaks in the 95th percentile 8.0.  Pressure in the 95th percentile 11.1 on pressure settings 6-12 with EPR level 3.  Reports initially difficulty tolerating nasal pillow mask which was initially provided.  She has been using her friends nasal mask which she has been tolerating well.  She does feel as though she could use a little bit more pressure.  She is scheduled for heart surgery on 6/22 - reports multiple episodes of passing out and "not getting enough oxygen". She believes increased fatigue and sleepiness is in setting of her heart condition and is hopeful this will improve after procedure.  Epworth Sleepiness Scale 11/24 (prior 1/24).  Fatigue severity scale 63/63 (prior 63/63).  No further concerns at this time  Update 09/12/2021 JM: Completed HST 06/2020 which showed overall mild OSA with total AHI 5.4/h and O2 nadir of 86%.  Recommended continued use of CPAP with plans on obtaining a new CPAP machine. She cancelled prior scheduled follow-up visit with Dr. Rexene Alberts in 10/2020.  She has not yet been able to obtain a new CPAP machine as insurance is requiring a face-to-face visit.  She is currently using her old machine with last 30-day compliance report reviewed  which showed 30 out of 30 usage days with 30 days greater than 4 hours for 100% compliance.  Average usage 9 hours and 18 minutes.  Residual AHI 0.1 on set pressure 9.  Leaks in the 95th percentile 18.0.  Reports not liking CPAP but uses it nightly. She has been having some issues tolerating mask - she reached out to DME company but was told she could not obtain a new mask until she receives a new CPAP machine.  Reports occasional leaks from mask especially when turning from side to side and current straps are uncomfortable.   No further apnea related concerns at this time.  Consult visit 05/20/2020 Dr. Rexene Alberts:  Ms. Cantrall is a 66 year old right-handed woman with an underlying medical history of vitamin D deficiency, thyroid disease, hypothyroidism, hypertension, reflux disease, intermittent, aortic valve insufficiency, with moderate to severe aortic regurgitation, anemia, arthritis, and morbid obesity with a BMI of over 50, who was previously diagnosed with obstructive sleep apnea and placed on CPAP therapy.  Prior sleep study results are not available for my review today.  Her original sleep studies were probably 15 years ago.  She reports having to studies back to back, likely diagnostic followed by a titration study.  Her current machine was given to her some 5 years ago after she had another set of studies through Dr. Alcide Clever in [place].  Her DME company was [place] [facility].  She has not received supplies in the recent months or  even years, she needs new supplies including a new headgear.  She uses nasal pillows.  Sometimes she has nostril irritation on the left side.  Of note, she fractured her nose some 20 years ago and has subsequently had a significant deviated septum and nasal deformity.    I reviewed your office note from 04/19/2020.  Her Epworth sleepiness score is 1 out of 24 today, fatigue severity score is 63 out of 63.  She feels easily short of breath with mild exertion, even just  walking from the parking lot to the clinic today.  She has been consistent with her CPAP.  She typically does not skip the night.  She has a variable sleep schedule depending on various factors, often she helps take care of her grandbabies.  She goes to bed anywhere between 9 PM and 1 AM and rise time is anywhere between 5 AM and 9 AM.  I was able to review her CPAP compliance data from 04/20/2020 through 05/19/2020, which is a total of 30 days, during which time she used her machine every night with percent use days greater than 4 hours at 100%, indicating superb compliance, average usage of 9 hours and 17 minutes, residual AHI at goal at 0.4/h, leak acceptable with a 95th percentile at 13.3 L/min on a pressure of 9 cm.  She reports that she could not tolerate the excess humidity and has reduced the humidity setting from 4 down to 2. She is working on weight loss.  She has lost some weight thus far.  She may need aortic valve replacement eventually.  She has a follow-up appointment with you in mid February.   She is married and lives with her husband.  She has a son and a daughter and 2 stepchildren.  She is retired, she worked as a [employer] representative.  She had a tonsillectomy and appendectomy at age 37.  She has undergone multiple other surgeries.  She also had significant left foot injury and surgery as well as hardware in place.   He is a non-smoker and does not drink alcohol and drinks caffeine in the form of coffee, 1 cup/day on average.       ROS:   14 system review of systems performed and negative with exception of those listed in HPI  PMH:  Past Medical History:  Diagnosis Date   Abnormal myocardial perfusion study 09/26/2019   Anemia    Angina pectoris ([place]) - Class II-III 09/26/2019   Aortic valve insufficiency    Arthritis    KNEES   Benign neoplasm of ascending colon    Benign neoplasm of cecum    Benign neoplasm of descending colon    Benign neoplasm of sigmoid colon     Benign neoplasm of transverse colon    Bowel habit changes    Cataract    BILATERAL-REMOVED   Chronic postoperative pain 12/02/2012   Diabetes mellitus without complication (HCC)    Disturbance of skin sensation 12/02/2012   Fibromyalgia    GERD (gastroesophageal reflux disease)    History of colonic polyps    History of kidney stones    Hypertension    Hypothyroidism    Idiopathic thrombocytopenic purpura (ITP) (HCC)    Myalgia and myositis, unspecified 12/02/2012   Personal history of colonic polyps    Pneumonia    Primary hypothyroidism 11/30/2015   Senile nuclear sclerosis 04/24/2014   Sleep apnea    uses c-pap   Thyroid disease    Thyroid nodule  11/30/2015   Type 2 diabetes mellitus without complications ([place]) 78/29/5621   Vitamin D deficiency 11/30/2015    PSH:  Past Surgical History:  Procedure Laterality Date   ABDOMINAL HYSTERECTOMY     AORTIC VALVE REPLACEMENT N/A 11/24/2021   Procedure: AORTIC VALVE REPLACEMENT (AVR) USING 25MM INSPIRIS RESILIA  AORTIC VALVE;  Surgeon: Dahlia Byes, MD;  Location: [facility];  Service: Open Heart Surgery;  Laterality: N/A;   APPENDECTOMY     CHOLECYSTECTOMY     COLONOSCOPY N/A 07/27/2015   Procedure: COLONOSCOPY;  Surgeon: Irene Shipper, MD;  Location: [facility];  Service: Endoscopy;  Laterality: N/A;   COLONOSCOPY WITH PROPOFOL N/A 04/28/2019   Procedure: COLONOSCOPY WITH PROPOFOL;  Surgeon: Irene Shipper, MD;  Location: [facility];  Service: Endoscopy;  Laterality: N/A;   EYE SURGERY     bilateral cataracts with lens implants   floater bone surgery     bone removed from right hand   FOOT SURGERY     RIGHT   FOOT SURGERY Left    triple orthodesis   left foot surgery     removed tendon and placed pins in foot   LEFT HEART CATH AND CORONARY ANGIOGRAPHY N/A 09/26/2019   Procedure: LEFT HEART CATH AND CORONARY ANGIOGRAPHY;  Surgeon: Leonie Man, MD;  Location: [facility];  Service: Cardiovascular;  Laterality:  N/A;   PLANTAR FASCIA SURGERY     both feet   POLYPECTOMY  04/28/2019   Procedure: POLYPECTOMY;  Surgeon: Irene Shipper, MD;  Location: [facility];  Service: Endoscopy;;   RIGHT HEART CATH N/A 02/15/2021   Procedure: RIGHT HEART CATH;  Surgeon: Nigel Mormon, MD;  Location: [facility];  Service: Cardiovascular;  Laterality: N/A;   RIGHT/LEFT HEART CATH AND CORONARY ANGIOGRAPHY N/A 11/08/2021   Procedure: RIGHT/LEFT HEART CATH AND CORONARY ANGIOGRAPHY;  Surgeon: Adrian Prows, MD;  Location: [facility];  Service: Cardiovascular;  Laterality: N/A;   TEE WITHOUT CARDIOVERSION N/A 11/24/2021   Procedure: TRANSESOPHAGEAL ECHOCARDIOGRAM (TEE);  Surgeon: Dahlia Byes, MD;  Location: [facility];  Service: Open Heart Surgery;  Laterality: N/A;   TONSILLECTOMY      Social History:  Social History   Socioeconomic History   Marital status: Married    Spouse name: Not on file   Number of children: 2   Years of education: Not on file   Highest education level: Some college, no degree  Occupational History    Comment: retired  Tobacco Use   Smoking status: Never   Smokeless tobacco: Never  Vaping Use   Vaping Use: Never used  Substance and Sexual Activity   Alcohol use: Never   Drug use: Never   Sexual activity: Not on file  Other Topics Concern   Not on file  Social History Narrative   Lives with husband   caffeine 1 c daily   Social Determinants of Health   Financial Resource Strain: Not on file  Food Insecurity: Not on file  Transportation Needs: Not on file  Physical Activity: Not on file  Stress: Not on file  Social Connections: Not on file  Intimate Partner Violence: Not on file    Family History:  Family History  Problem Relation Age of Onset   Heart disease Mother 36   Diabetes Mother    Hypertension Mother    Hypertension Father    Colon polyps Brother    Stroke Maternal Grandmother    Heart disease Paternal Grandmother  Throat cancer Paternal  Grandfather    Colon cancer Neg Hx    Esophageal cancer Neg Hx    Rectal cancer Neg Hx    Stomach cancer Neg Hx     Medications:   Current Outpatient Medications on File Prior to Visit  Medication Sig Dispense Refill   acetaminophen (TYLENOL) 500 MG tablet Take 1,000 mg by mouth every 6 (six) hours as needed for moderate pain.     amoxicillin (AMOXIL) 500 MG tablet Take 1 tablet (500 mg total) by mouth 2 (two) times daily. Take 4 tablets by mouth 1 hour before dental cleaning or dental procedures 10 tablet 4   diclofenac (CATAFLAM) 50 MG tablet Take 1 tablet (50 mg total) by mouth daily.     levothyroxine (SYNTHROID, LEVOTHROID) 175 MCG tablet Take 175-262.5 mcg by mouth See admin instructions. Take 175 mcg by mouth daily, except on Tuesday take 262.5 mcg     linaclotide (LINZESS) 290 MCG CAPS capsule Take 290 mcg by mouth daily before breakfast.     losartan (COZAAR) 25 MG tablet Take 1 tablet (25 mg total) by mouth every evening. 90 tablet 3   metoprolol tartrate (LOPRESSOR) 25 MG tablet Take 1 tablet (25 mg total) by mouth 2 (two) times daily. 180 tablet 1   nitroGLYCERIN (NITROSTAT) 0.4 MG SL tablet Place 1 tablet (0.4 mg total) under the tongue every 5 (five) minutes as needed for chest pain. 25 tablet 2   omeprazole (PRILOSEC) 40 MG capsule Take 40 mg by mouth daily.     polyethylene glycol (MIRALAX / GLYCOLAX) 17 g packet Take 17 g by mouth daily.     rosuvastatin (CRESTOR) 5 MG tablet TAKE ONE TABLET BY MOUTH DAILY 90 tablet 3   torsemide (DEMADEX) 20 MG tablet Take 1 tablet (20 mg total) by mouth 2 (two) times daily. 90 tablet 3   No current facility-administered medications on file prior to visit.    Allergies:   Allergies  Allergen Reactions   Metamucil [Psyllium] Other (See Comments)    Severe constipation   Trulicity [Dulaglutide]     KIDNEY INFECTION,INABILITY TO HAVE BOWEL MOVEMENTS,BLOOD IN URINE   Adhesive [Tape]     Latex, band aids  causes whelts and blisters    Hydrocodone-Acetaminophen Itching    Tolerates small/ infrequent doses   Latex Other (See Comments)    Band-aids causes whelts and blisters   Oxycontin [Oxycodone Hcl]     "FELT LIKE HEAD WAS GOING TO EXPLODE"   Ciprofloxacin Rash    Yeast infection and a severe rash    Codeine Itching and Rash   Other Palpitations    Steroids      "makes me feel like I'm having a heart attack. -      OBJECTIVE:  Physical Exam  There were no vitals filed for this visit.  There is no height or weight on file to calculate BMI. No results found.   General: Morbidly obese very pleasant middle-age Caucasian female, seated, in no evident distress Head: head normocephalic and atraumatic.   Neck: supple with no carotid or supraclavicular bruits Cardiovascular: regular rate and rhythm Musculoskeletal: no deformity Skin:  no rash/petichiae Vascular:  Normal pulses all extremities   Neurologic Exam Mental Status: Awake and fully alert. Oriented to place and time. Recent and remote memory intact. Attention span, concentration and fund of knowledge appropriate. Mood and affect appropriate.  Cranial Nerves: Pupils equal, briskly reactive to light. Extraocular movements full without nystagmus. Visual fields full  to confrontation. Hearing intact. Facial sensation intact. Face, tongue, palate moves normally and symmetrically.  Motor: Normal bulk and tone. Normal strength in all tested extremity muscles Sensory.: intact to touch , pinprick , position and vibratory sensation.  Coordination: Rapid alternating movements normal in all extremities. Finger-to-nose and heel-to-shin performed accurately bilaterally. Gait and Station: Arises from chair without difficulty. Stance is normal. Gait demonstrates wide-based gait without use of assistive device. Tandem walk and heel toe not attempted Reflexes: 1+ and symmetric. Toes downgoing.         ASSESSMENT: [place] OBORN is a 66 y.o. year old female OSA on  CPAP. Repeat HST 06/2020 which showed total AHI of 5.4/h.  Received new CPAP machine 09/2021     PLAN:  OSA on CPAP : per pt request, will submit order to be refitted with new mask (see HPI). Will also adjust pressure settings to improve comfort from 6-12 to 8-12.  Otherwise, compliance report shows satisfactory usage with optimal residual AHI.  Discussed continued nightly usage with ensuring greater than 4 hours nightly for optimal benefit and per insurance purposes.  Continue to follow with DME company for any needed supplies or CPAP related concerns   Follow-up in 6 months or call earlier if needed    CC:  PCP: Ronita Hipps, MD    I spent 26 minutes of face-to-face and non-face-to-face time with patient and husband.  This included previsit chart review, lab review, study review, order entry, electronic health record documentation, patient and husband education regarding sleep apnea with review and discussion of compliance report and answered all other questions to patient and husband's satisfaction   Frann Rider, AGNP-BC  [facility] [address] [place], [place] [address]  Phone [phone] Fax [phone] Note: This document was prepared with digital dictation and possible smart phrase technology. Any transcriptional errors that result from this process are unintentional.

## 2022-05-17 ENCOUNTER — Encounter: Payer: Self-pay | Admitting: Adult Health

## 2022-05-17 ENCOUNTER — Ambulatory Visit: Payer: HMO | Admitting: Adult Health

## 2022-05-17 VITALS — BP 118/70 | HR 88 | Ht 68.0 in | Wt 300.4 lb

## 2022-05-17 DIAGNOSIS — G4733 Obstructive sleep apnea (adult) (pediatric): Secondary | ICD-10-CM | POA: Diagnosis not present

## 2022-05-17 DIAGNOSIS — Z789 Other specified health status: Secondary | ICD-10-CM | POA: Diagnosis not present

## 2022-05-17 NOTE — Addendum Note (Signed)
Addended by: Frann Rider L on: 05/17/2022 01:57 PM   Modules accepted: Orders

## 2022-05-17 NOTE — Patient Instructions (Addendum)
Please contact [facility] to discuss machine concerns at [phone]  Try nasal spray and daily allergy medication to see if this helps with congestion. If symptoms persist, please let me know  Please try to restart using your CPAP routinely at night     Follow up with Dr. Rexene Alberts in 3 months or call earlier if needed

## 2022-05-22 ENCOUNTER — Telehealth: Payer: Self-pay

## 2022-05-22 NOTE — Telephone Encounter (Signed)
Patient left a voicemail about experiencing some dizziness and syncope. She saw her PCP and they took her off of the tosemide and said she was taking too much. She also had a sinus infection and was dehydrated. Since reducing the medication and upping her fluid intake she is feeling better. She is still a little dizzy but no syncope. She doesn't feel like it is necessary to come into the office because she is feeling better and in touch with her PCP.

## 2022-06-09 ENCOUNTER — Other Ambulatory Visit: Payer: Self-pay | Admitting: Cardiology

## 2022-06-09 DIAGNOSIS — E7800 Pure hypercholesterolemia, unspecified: Secondary | ICD-10-CM

## 2022-06-12 ENCOUNTER — Telehealth: Payer: Self-pay

## 2022-06-12 ENCOUNTER — Encounter: Payer: Self-pay | Admitting: Cardiology

## 2022-06-12 NOTE — Telephone Encounter (Signed)
Patient called and stated that her HR on her Joanna Alvarado and it keep saying "bradycardia and is concerned that she may need to come in soon. You can message her on MyChart, patient is aware.

## 2022-06-21 DIAGNOSIS — I10 Essential (primary) hypertension: Secondary | ICD-10-CM | POA: Diagnosis not present

## 2022-06-21 DIAGNOSIS — G4733 Obstructive sleep apnea (adult) (pediatric): Secondary | ICD-10-CM | POA: Diagnosis not present

## 2022-06-30 DIAGNOSIS — M25561 Pain in right knee: Secondary | ICD-10-CM | POA: Diagnosis not present

## 2022-07-12 ENCOUNTER — Ambulatory Visit: Payer: PPO | Admitting: Internal Medicine

## 2022-07-12 ENCOUNTER — Encounter: Payer: Self-pay | Admitting: Internal Medicine

## 2022-07-12 ENCOUNTER — Telehealth: Payer: Self-pay

## 2022-07-12 VITALS — BP 155/128 | HR 135 | Ht 68.0 in | Wt 300.2 lb

## 2022-07-12 DIAGNOSIS — I959 Hypotension, unspecified: Secondary | ICD-10-CM

## 2022-07-12 DIAGNOSIS — R600 Localized edema: Secondary | ICD-10-CM | POA: Diagnosis not present

## 2022-07-12 DIAGNOSIS — I10 Essential (primary) hypertension: Secondary | ICD-10-CM | POA: Diagnosis not present

## 2022-07-12 MED ORDER — METOPROLOL SUCCINATE ER 25 MG PO TB24: 25.0000 mg | ORAL_TABLET | Freq: Every day | ORAL | 3 refills | Status: DC

## 2022-07-12 MED ORDER — LOSARTAN POTASSIUM 25 MG PO TABS: 12.5000 mg | ORAL_TABLET | Freq: Every day | ORAL | 3 refills | Status: DC

## 2022-07-12 NOTE — Telephone Encounter (Signed)
error 

## 2022-07-12 NOTE — Progress Notes (Signed)
Primary Physician/Referring:  Ronita Hipps, MD  Patient ID: Joanna Alvarado, female    DOB: 1956/05/27, 67 y.o.   MRN: [id number]  Chief Complaint  Patient presents with  . Hypotension  . Follow-up  . Dizziness   HPI:    Joanna Alvarado  is a 67 y.o. female with a past medical history of hypertension, severe aortic regurgitation S/P AV replacement 11/24/2021 with bioprosthetic AV, controlled diabetes mellitus, sleep apnea using CPAP at night, GERD, hypercholesterolemia, hiatal hernia, fibromyalgia, hypothyroidism and chronic stable angina with normal coronary arteries/minimal coronary artery disease by angiography in April 2021.  She presents today for follow-up for low blood pressures. She feels very light-headed on all of these meds. She is taking torsemide only once daily and her edema is stable on this. We discussed spacing out her medications and staying well hydrated. Otherwise, she denies chest pain, palpitations, leg edema, orthopnea, PND, syncope.   Past Surgical History:  Procedure Laterality Date  . ABDOMINAL HYSTERECTOMY    . AORTIC VALVE REPLACEMENT N/A 11/24/2021   Procedure: AORTIC VALVE REPLACEMENT (AVR) USING 25MM INSPIRIS RESILIA  AORTIC VALVE;  Surgeon: Dahlia Byes, MD;  Location: [facility];  Service: Open Heart Surgery;  Laterality: N/A;  . APPENDECTOMY    . CHOLECYSTECTOMY    . COLONOSCOPY N/A 07/27/2015   Procedure: COLONOSCOPY;  Surgeon: Irene Shipper, MD;  Location: [facility];  Service: Endoscopy;  Laterality: N/A;  . COLONOSCOPY WITH PROPOFOL N/A 04/28/2019   Procedure: COLONOSCOPY WITH PROPOFOL;  Surgeon: Irene Shipper, MD;  Location: [facility];  Service: Endoscopy;  Laterality: N/A;  . EYE SURGERY     bilateral cataracts with lens implants  . floater bone surgery     bone removed from right hand  . FOOT SURGERY     RIGHT  . FOOT SURGERY Left    triple orthodesis  . left foot surgery     removed tendon and placed pins in foot  . LEFT HEART CATH  AND CORONARY ANGIOGRAPHY N/A 09/26/2019   Procedure: LEFT HEART CATH AND CORONARY ANGIOGRAPHY;  Surgeon: Leonie Man, MD;  Location: [facility];  Service: Cardiovascular;  Laterality: N/A;  . PLANTAR FASCIA SURGERY     both feet  . POLYPECTOMY  04/28/2019   Procedure: POLYPECTOMY;  Surgeon: Irene Shipper, MD;  Location: [facility];  Service: Endoscopy;;  . RIGHT HEART CATH N/A 02/15/2021   Procedure: RIGHT HEART CATH;  Surgeon: Nigel Mormon, MD;  Location: [facility];  Service: Cardiovascular;  Laterality: N/A;  . RIGHT/LEFT HEART CATH AND CORONARY ANGIOGRAPHY N/A 11/08/2021   Procedure: RIGHT/LEFT HEART CATH AND CORONARY ANGIOGRAPHY;  Surgeon: Adrian Prows, MD;  Location: [facility];  Service: Cardiovascular;  Laterality: N/A;  . TEE WITHOUT CARDIOVERSION N/A 11/24/2021   Procedure: TRANSESOPHAGEAL ECHOCARDIOGRAM (TEE);  Surgeon: Dahlia Byes, MD;  Location: [facility] [facility];  Service: Open Heart Surgery;  Laterality: N/A;  . TONSILLECTOMY      Social History   Tobacco Use  . Smoking status: Never  . Smokeless tobacco: Never  Substance Use Topics  . Alcohol use: Never   Marital Status: Married   ROS  Review of Systems  Cardiovascular:  Positive for dyspnea on exertion (stable, improving). Negative for chest pain, leg swelling and orthopnea.  Musculoskeletal:  Positive for joint pain.  Gastrointestinal:  Negative for nausea and vomiting.  Neurological:  Positive for light-headedness.   Objective  Blood pressure (!) 155/128, pulse Marland Kitchen)  135, height 5' 8"$  (1.727 m), weight (!) 300 lb 3.2 oz (136.2 kg), SpO2 99 %.     07/12/2022    1:17 PM 07/12/2022    1:15 PM 07/12/2022    1:13 PM  Vitals with BMI  Systolic [id number]  Diastolic 0000000 71 64  Pulse 135 96 72     Physical Exam Constitutional:      Appearance: She is morbidly obese.     Comments: Morbidly obese. No acute distress.  Neck:     Vascular: No JVD.  Cardiovascular:     Rate and Rhythm: Normal  rate and regular rhythm.     Pulses: Intact distal pulses.          Radial pulses are 2+ on the right side and 2+ on the left side.       Popliteal pulses are 0 on the right side and 0 on the left side.       Dorsalis pedis pulses are 1+ on the right side and 1+ on the left side.       Posterior tibial pulses are 0 on the right side and 0 on the left side.     Heart sounds: Murmur heard.     Low-pitched harsh midsystolic murmur is present at the upper right sternal border. Soft murmur through AV     No gallop.  Pulmonary:     Effort: Pulmonary effort is normal.     Breath sounds: Normal breath sounds.  Abdominal:     General: Bowel sounds are normal.     Palpations: Abdomen is soft.     Comments: Large pannus  Musculoskeletal:     Right lower leg: No edema.     Left lower leg: No edema.   Laboratory examination:   Recent Labs    11/28/21 0308 11/29/21 0210 01/03/22 1206 04/10/22 1500 04/17/22 1016  NA 139 140 143 146* 147*  K 4.1 4.0 4.0 4.1 3.6  CL 108 109 110* 109* 111  CO2 24 23 20 22 24  $ GLUCOSE 103* 108* 97 97 101*  BUN 33* 27* 16 16 20  $ CREATININE 1.17* 1.01* 1.10* 1.09* 1.27*  CALCIUM 8.7* 8.8* 9.5 9.5 9.4  GFRNONAA 51* >60  --   --  46*    CrCl cannot be calculated (Patient's most recent lab result is older than the maximum 21 days allowed.).     Latest Ref Rng & Units 04/17/2022   10:16 AM 04/10/2022    3:00 PM 01/03/2022   12:06 PM  CMP  Glucose 70 - 99 mg/dL 101  97  97   BUN 8 - 23 mg/dL 20  16  16   $ Creatinine 0.44 - 1.00 mg/dL 1.27  1.09  1.10   Sodium 135 - 145 mmol/L 147  146  143   Potassium 3.5 - 5.1 mmol/L 3.6  4.1  4.0   Chloride 98 - 111 mmol/L 111  109  110   CO2 22 - 32 mmol/L 24  22  20   $ Calcium 8.9 - 10.3 mg/dL 9.4  9.5  9.5   Total Protein 6.5 - 8.1 g/dL 5.9   5.4   Total Bilirubin 0.3 - 1.2 mg/dL 1.0   0.6   Alkaline Phos 38 - 126 U/L 109   115   AST 15 - 41 U/L 34   31   ALT 0 - 44 U/L 17   11       Latest Ref Rng &  Units  03/13/2022    3:32 PM 01/03/2022   12:08 PM 11/29/2021    2:10 AM  CBC  WBC 3.4 - 10.8 x10E3/uL 4.4  3.6  6.9   Hemoglobin 11.1 - 15.9 g/dL 11.1  11.4  10.1   Hematocrit 34.0 - 46.6 % 34.2  35.7  29.5   Platelets 150 - 450 x10E3/uL 87  88  77     Lipid Panel No results for input(s): "CHOL", "TRIG", "[place]", "VLDL", "HDL", "CHOLHDL", "LDLDIRECT" in the last 8760 hours.  External labs:   Medications and allergies   Allergies  Allergen Reactions  . Metamucil [Psyllium] Other (See Comments)    Severe constipation  . Trulicity [Dulaglutide]     KIDNEY INFECTION,INABILITY TO HAVE BOWEL MOVEMENTS,BLOOD IN URINE  . Adhesive [Tape]     Latex, band aids  causes whelts and blisters  . Hydrocodone-Acetaminophen Itching    Tolerates small/ infrequent doses  . Latex Other (See Comments)    Band-aids causes whelts and blisters  . Oxycontin [Oxycodone Hcl]     "FELT LIKE HEAD WAS GOING TO EXPLODE"  . Ciprofloxacin Rash    Yeast infection and a severe rash   . Codeine Itching and Rash  . Other Palpitations    Steroids      "makes me feel like I'm having a heart attack. -     Current Outpatient Medications:  .  acetaminophen (TYLENOL) 500 MG tablet, Take 1,000 mg by mouth every 6 (six) hours as needed for moderate pain., Disp: , Rfl:  .  amoxicillin (AMOXIL) 500 MG tablet, Take 1 tablet (500 mg total) by mouth 2 (two) times daily. Take 4 tablets by mouth 1 hour before dental cleaning or dental procedures, Disp: 10 tablet, Rfl: 4 .  diclofenac (CATAFLAM) 50 MG tablet, Take 1 tablet (50 mg total) by mouth daily., Disp: , Rfl:  .  levothyroxine (SYNTHROID, LEVOTHROID) 175 MCG tablet, Take 175-262.5 mcg by mouth See admin instructions. Take 175 mcg by mouth daily, except on Tuesday take 262.5 mcg, Disp: , Rfl:  .  linaclotide (LINZESS) 290 MCG CAPS capsule, Take 290 mcg by mouth daily before breakfast., Disp: , Rfl:  .  losartan (COZAAR) 25 MG tablet, Take 0.5 tablets (12.5 mg total) by mouth  at bedtime., Disp: 90 tablet, Rfl: 3 .  metoprolol succinate (TOPROL XL) 25 MG 24 hr tablet, Take 1 tablet (25 mg total) by mouth daily., Disp: 90 tablet, Rfl: 3 .  nitroGLYCERIN (NITROSTAT) 0.4 MG SL tablet, Place 1 tablet (0.4 mg total) under the tongue every 5 (five) minutes as needed for chest pain., Disp: 25 tablet, Rfl: 2 .  omeprazole (PRILOSEC) 40 MG capsule, Take 40 mg by mouth daily., Disp: , Rfl:  .  polyethylene glycol (MIRALAX / GLYCOLAX) 17 g packet, Take 17 g by mouth daily., Disp: , Rfl:  .  rosuvastatin (CRESTOR) 5 MG tablet, TAKE ONE TABLET BY MOUTH DAILY, Disp: 90 tablet, Rfl: 3 .  torsemide (DEMADEX) 20 MG tablet, Take 1 tablet (20 mg total) by mouth 2 (two) times daily. (Patient taking differently: Take 20 mg by mouth daily.), Disp: 90 tablet, Rfl: 3   Radiology:   CT angiogram chest 10/07/2021: There is mild aortic atherosclerosis, no dissection. Fusiform aneurysmal dilatation of the ascending aorta, maximal dimension 4.2 cm. Recommend annual imaging followup by CTA or MRA. Tiny hiatal hernia.  CXR 2 view 01/02/2022: There is elevation of the left hemidiaphragm with left basilar atelectasis. There is gaseous distention of  the stomach. This is increased compared to prior CT. No pleural effusion or pneumothorax identified. Cardiomediastinal silhouette is within normal limits. Patient is status post cardiac valve replacement.  Cardiac Studies:   Carotid artery duplex 04-Apr-2020: The bifurcation, internal, external and common carotid arteries reveal no evidence of significant stenosis, bilaterally. Antegrade right vertebral artery flow. Antegrade left vertebral artery flow.   Comparison(s): No significant change from prior study. 09/06/2021. Previously LV was mildly dilated at 5.7 cm now 6.0 cm.  Right & Left Heart Catheterization 11/08/21:  LV: 134/9, EDP 21 mmHg.  Ao 130/52, mean 97 mmHg.  Pulse pressure 82 mmHg.  Moderately elevated LVEDP. LM: Large vessel: Smooth  and normal. LAD: Large caliber vessel.  Gives origin to a moderate-sized D1 which immediately bifurcates into secondary branch.  Ostium has 85% focal stenosis.  D2 is moderate size, mild disease.  Mid segment of the LAD after D2 has 10 to 20% stenosis. CX: Gives origin to a small high OM1 followed by a moderate to large sized OM 2 and small OM 3 and OM 4.  AV groove circumflex is small to moderate-sized vessel with the ostium 10 to 20% stenosis. RCA: Dominant.  Mildly ectatic in the proximal segment.  Mild disease of 10 to 20% in the midsegment.  Slow flow in the right coronary artery. Ascending aortogram: There is moderate dilatation of the ascending aorta.  Aortic regurgitation is evident, severity was not assessed as it was a hand contrast injection.  Right heart: RA 12/9, mean 8 mmHg RV 32/3, EDP 16 mmHg PA 27/6, mean 16 mmHg PA saturation 80%. PW: Not obtained as I had difficulty in advancing the PA catheter to the wedge position due to tortuosity of the peripheral vessels. CO 11.19 and CI 4.57.  QP/QS 1.00.  Recommendation: Patient will be reassessed by Dr. Darcey Nora for consideration of [place] procedure.  D1 is small to moderate size, will discuss with him regarding revascularization versus medical therapy only.  Echocardiogram 01/13/2022: 1. Left ventricle cavity is normal in size and wall thickness. Abnormal septal wall motion due to postoperative valve. Normal LV systolic function with EF 56%. Normal diastolic filling pattern. 2. Left atrial cavity is mildly dilated. 3. Well seated bioprosthetic aortic valve (Inspiris resilia size 57m, per op note). Leaflets not well visualized. No significant aortic regurgitation. mean PG 17 mmHg slightly higher than intraoperative TEE. No significant stenosis. 4. Mild (Grade I) mitral regurgitation. 5. Mild tricuspid regurgitation. 6. The aortic root is normal. Mildly dilated ascending aorta at 4.1 cm. 7. No evidence of pulmonary hypertension. 8.  Compared to pre-operative TTE dates 11/22/2021, bioprosthetic valve is new. LV dilatation, mod-severe AI is resolved.  EKG:   EKG 01/02/2022: Normal sinus rhythm at rate of 68 bpm, left axis deviation, left anterior fascicular block.  Poor R wave progression, cannot exclude anteroseptal infarct old.  LVH.  Diffuse T wave abnormality.  Compared to 08/31/2021, no significant change.     Assessment     ICD-10-CM   1. Hypotension, unspecified hypotension type  I95.9 EKG 12-Lead    2. Essential hypertension  I10     3. Bilateral leg edema  R60.0       Medications Discontinued During This Encounter  Medication Reason  . metoprolol tartrate (LOPRESSOR) 25 MG tablet   . losartan (COZAAR) 25 MG tablet       Meds ordered this encounter  Medications  . losartan (COZAAR) 25 MG tablet    Sig: Take 0.5 tablets (12.5 mg total)  by mouth at bedtime.    Dispense:  90 tablet    Refill:  3  . metoprolol succinate (TOPROL XL) 25 MG 24 hr tablet    Sig: Take 1 tablet (25 mg total) by mouth daily.    Dispense:  90 tablet    Refill:  3   Orders Placed This Encounter  Procedures  . EKG 12-Lead    Recommendations:   Bilateral leg edema She has diuresed well on torsemide 20 mg once daily, will continue this. She was quite orthostatic with twice daily dosing and is doing well with once daily.  Primary hypertension Hypotension Blood pressure is well controlled but she has periods of very low BP.  She does monitor her blood pressure at home and during cardiac rehab and readings are 110-120s/70s. Continue low sodium diet less than 2034m daily and increasing exercise as tolerated.  Will cut losartan in half and change metoprolol to long acting   Follow-up at next scheduled office visit.   SFloydene Flock AGNP-C 07/16/2022, 2:23 PM Office: [phone]

## 2022-07-22 DIAGNOSIS — I10 Essential (primary) hypertension: Secondary | ICD-10-CM | POA: Diagnosis not present

## 2022-07-22 DIAGNOSIS — G4733 Obstructive sleep apnea (adult) (pediatric): Secondary | ICD-10-CM | POA: Diagnosis not present

## 2022-08-03 ENCOUNTER — Telehealth: Payer: Self-pay

## 2022-08-03 ENCOUNTER — Encounter: Payer: Self-pay | Admitting: Internal Medicine

## 2022-08-03 NOTE — Telephone Encounter (Signed)
Patient calling because she is experiencing some low BP. She says she hadn't taken her BP meds today. She just had an episode, and she felt like she was near syncope. She just took her BP and it is 102/63. She said she was in the shower and got a back cramp, got out of the shower, sat down, and then it hit her that she was going to pass out. She said she didn't fully pass out but felt very close.

## 2022-08-04 DIAGNOSIS — E041 Nontoxic single thyroid nodule: Secondary | ICD-10-CM | POA: Diagnosis not present

## 2022-08-04 DIAGNOSIS — E039 Hypothyroidism, unspecified: Secondary | ICD-10-CM | POA: Diagnosis not present

## 2022-08-04 DIAGNOSIS — E119 Type 2 diabetes mellitus without complications: Secondary | ICD-10-CM | POA: Diagnosis not present

## 2022-08-04 DIAGNOSIS — E559 Vitamin D deficiency, unspecified: Secondary | ICD-10-CM | POA: Diagnosis not present

## 2022-08-06 ENCOUNTER — Encounter: Payer: Self-pay | Admitting: Cardiology

## 2022-08-07 ENCOUNTER — Other Ambulatory Visit: Payer: Self-pay | Admitting: Internal Medicine

## 2022-08-07 DIAGNOSIS — I351 Nonrheumatic aortic (valve) insufficiency: Secondary | ICD-10-CM

## 2022-08-07 NOTE — Telephone Encounter (Signed)
From patient

## 2022-08-07 NOTE — Telephone Encounter (Signed)
I just ordered anm echo for her to be done in hospital. Have her hold all of her bp meds

## 2022-08-07 NOTE — Telephone Encounter (Signed)
I'm not here [place]

## 2022-08-09 ENCOUNTER — Encounter: Payer: Self-pay | Admitting: Cardiology

## 2022-08-09 ENCOUNTER — Ambulatory Visit: Payer: PPO | Admitting: Cardiology

## 2022-08-09 VITALS — BP 121/76 | HR 114 | Resp 16 | Ht 68.0 in | Wt 303.2 lb

## 2022-08-09 DIAGNOSIS — I77810 Thoracic aortic ectasia: Secondary | ICD-10-CM | POA: Diagnosis not present

## 2022-08-09 DIAGNOSIS — Z953 Presence of xenogenic heart valve: Secondary | ICD-10-CM | POA: Diagnosis not present

## 2022-08-09 DIAGNOSIS — I951 Orthostatic hypotension: Secondary | ICD-10-CM

## 2022-08-09 DIAGNOSIS — I10 Essential (primary) hypertension: Secondary | ICD-10-CM

## 2022-08-09 DIAGNOSIS — R55 Syncope and collapse: Secondary | ICD-10-CM

## 2022-08-09 MED ORDER — MIDODRINE HCL 5 MG PO TABS: 5.0000 mg | ORAL_TABLET | Freq: Three times a day (TID) | ORAL | 1 refills | Status: DC

## 2022-08-09 NOTE — Progress Notes (Signed)
Primary Physician/Referring:  Ronita Hipps, MD  Patient ID: Joanna Alvarado, female    DOB: 1956/05/15, 67 y.o.   MRN: [id number]  Chief Complaint  Patient presents with   Aortic valve replacement   Hypertension   Follow-up    6 months   HPI:    Joanna Alvarado  is a 67 y.o. female with a past medical history of hypertension, severe aortic regurgitation S/P BIOPROSTHETIC AV replacement 11/24/2021, controlled diabetes mellitus, chronic thrombocytopenia, OSA on CPAP, GERD, hiatal hernia, hypercholesterolemia, hypothyroidism and chronic stable angina with normal coronary arteries/minimal coronary artery disease by angiography in April 2021.  She was seen on 07/12/2022 with severe lightheadedness and near syncope and was diagnosed with hypotension and metoprolol to tartrate 25 mg was discontinued and changed to metoprolol succinate 25 mg daily losartan 25 mg was reduced to 1/2 tablet daily  Patient continues to have episodes of near syncope, had 1 episode of syncope at home when she suddenly stood up.  Since then she has been extremely nervous about standing up suddenly  Otherwise denies chest pain, no change in shortness of breath, denies leg edema.     Past Surgical History:  Procedure Laterality Date   ABDOMINAL HYSTERECTOMY     AORTIC VALVE REPLACEMENT N/A 11/24/2021   Procedure: AORTIC VALVE REPLACEMENT (AVR) USING 25MM INSPIRIS RESILIA  AORTIC VALVE;  Surgeon: Dahlia Byes, MD;  Location: [facility];  Service: Open Heart Surgery;  Laterality: N/A;   APPENDECTOMY     CHOLECYSTECTOMY     COLONOSCOPY N/A 07/27/2015   Procedure: COLONOSCOPY;  Surgeon: Irene Shipper, MD;  Location: [facility];  Service: Endoscopy;  Laterality: N/A;   COLONOSCOPY WITH PROPOFOL N/A 04/28/2019   Procedure: COLONOSCOPY WITH PROPOFOL;  Surgeon: Irene Shipper, MD;  Location: [facility];  Service: Endoscopy;  Laterality: N/A;   EYE SURGERY     bilateral cataracts with lens implants   floater bone surgery      bone removed from right hand   FOOT SURGERY     RIGHT   FOOT SURGERY Left    triple orthodesis   left foot surgery     removed tendon and placed pins in foot   LEFT HEART CATH AND CORONARY ANGIOGRAPHY N/A 09/26/2019   Procedure: LEFT HEART CATH AND CORONARY ANGIOGRAPHY;  Surgeon: Leonie Man, MD;  Location: [facility];  Service: Cardiovascular;  Laterality: N/A;   PLANTAR FASCIA SURGERY     both feet   POLYPECTOMY  04/28/2019   Procedure: POLYPECTOMY;  Surgeon: Irene Shipper, MD;  Location: [facility];  Service: Endoscopy;;   RIGHT HEART CATH N/A 02/15/2021   Procedure: RIGHT HEART CATH;  Surgeon: Nigel Mormon, MD;  Location: [facility];  Service: Cardiovascular;  Laterality: N/A;   RIGHT/LEFT HEART CATH AND CORONARY ANGIOGRAPHY N/A 11/08/2021   Procedure: RIGHT/LEFT HEART CATH AND CORONARY ANGIOGRAPHY;  Surgeon: Adrian Prows, MD;  Location: [facility];  Service: Cardiovascular;  Laterality: N/A;   TEE WITHOUT CARDIOVERSION N/A 11/24/2021   Procedure: TRANSESOPHAGEAL ECHOCARDIOGRAM (TEE);  Surgeon: Dahlia Byes, MD;  Location: [facility];  Service: Open Heart Surgery;  Laterality: N/A;   TONSILLECTOMY      Social History   Tobacco Use   Smoking status: Never   Smokeless tobacco: Never  Substance Use Topics   Alcohol use: Never   Marital Status: Married   ROS  Review of Systems  Cardiovascular:  Positive for dyspnea on exertion (stable,  improving). Negative for chest pain, leg swelling and orthopnea.  Gastrointestinal:  Negative for nausea and vomiting.  Neurological:  Positive for light-headedness.   Objective  Blood pressure 121/76, pulse (!) 114, resp. rate 16, height '5\' 8"'$  (1.727 m), weight (!) 303 lb 3.2 oz (137.5 kg), SpO2 97 %.      08/09/2022    1:20 PM 07/12/2022    1:17 PM 07/12/2022    1:15 PM  Vitals with BMI  Height '5\' 8"'$     Weight 303 lbs 3 oz    BMI 99991111    Systolic [id number]  Diastolic 76 0000000 71  Pulse 114 135 96     Orthostatic VS for the past 72 hrs (Last 3 readings):  Orthostatic BP Patient Position BP Location Cuff Size Orthostatic Pulse  08/09/22 1338 109/78 Standing Left Arm Large 127  08/09/22 1337 117/77 Sitting Left Arm Large 79  08/09/22 1335 115/69 Supine Left Arm Large 83    Physical Exam Constitutional:      Appearance: She is morbidly obese.     Comments: Morbidly obese. No acute distress.  Neck:     Vascular: No JVD.  Cardiovascular:     Rate and Rhythm: Normal rate and regular rhythm.     Pulses: Intact distal pulses.          Radial pulses are 2+ on the right side and 2+ on the left side.       Popliteal pulses are 0 on the right side and 0 on the left side.       Dorsalis pedis pulses are 1+ on the right side and 1+ on the left side.       Posterior tibial pulses are 0 on the right side and 0 on the left side.     Heart sounds: Murmur heard.     Low-pitched harsh midsystolic murmur is present at the upper right sternal border. Soft murmur through AV     No gallop.  Pulmonary:     Effort: Pulmonary effort is normal.     Breath sounds: Normal breath sounds.  Abdominal:     General: Bowel sounds are normal.     Palpations: Abdomen is soft.     Comments: Large pannus  Musculoskeletal:     Right lower leg: No edema.     Left lower leg: No edema.    Laboratory examination:   Recent Labs    11/28/21 0308 11/29/21 0210 01/03/22 1206 04/10/22 1500 04/17/22 1016  NA 139 140 143 146* 147*  K 4.1 4.0 4.0 4.1 3.6  CL 108 109 110* 109* 111  CO2 '24 23 20 22 24  '$ GLUCOSE 103* 108* 97 97 101*  BUN 33* 27* '16 16 20  '$ CREATININE 1.17* 1.01* 1.10* 1.09* 1.27*  CALCIUM 8.7* 8.8* 9.5 9.5 9.4  GFRNONAA 51* >60  --   --  46*       Latest Ref Rng & Units 04/17/2022   10:16 AM 04/10/2022    3:00 PM 01/03/2022   12:06 PM  CMP  Glucose 70 - 99 mg/dL 101  97  97   BUN 8 - 23 mg/dL '20  16  16   '$ Creatinine 0.44 - 1.00 mg/dL 1.27  1.09  1.10   Sodium 135 - 145 mmol/L 147  146  143    Potassium 3.5 - 5.1 mmol/L 3.6  4.1  4.0   Chloride 98 - 111 mmol/L 111  109  110   CO2  22 - 32 mmol/L '24  22  20   '$ Calcium 8.9 - [id number] mg/dL 9.4  9.5  9.5   Total Protein 6.5 - 8.1 g/dL 5.9   5.4   Total Bilirubin 0.3 - 1.2 mg/dL 1.0   0.6   Alkaline Phos 38 - 126 U/L 109   115   AST 15 - 41 U/L 34   31   ALT 0 - 44 U/L 17   11       Latest Ref Rng & Units 03/13/2022    3:32 PM 01/03/2022   12:08 PM 11/29/2021    2:10 AM  CBC  WBC 3.4 - 10.8 x10E3/uL 4.4  3.6  6.9   Hemoglobin 11.1 - 15.9 g/dL 11.1  11.4  10.1   Hematocrit 34.0 - 46.6 % 34.2  35.7  29.5   Platelets 150 - 450 x10E3/uL 87  88  77    Lab Results  Component Value Date   CHOL 119 09/30/2019   HDL 37 (L) 09/30/2019   LDLCALC 63 09/30/2019   TRIG 103 09/30/2019   CHOLHDL 3.2 09/30/2019      External labs:   Labs 08/04/2022:  TSH markedly reduced at 0.06.  T4 elevated at 2.2.  Free T3 decreased at 2.08.  Vitamin D 52.  A1c 5.7%.  Labs 04/17/2022:  Sodium 147, potassium 3.6, BUN 20, creatinine 1.27, EGFR 46 mL.  Hb 11.1/HCT 34.2, platelets 87.  Medications and allergies   Allergies  Allergen Reactions   Metamucil [Psyllium] Other (See Comments)    Severe constipation   Trulicity [Dulaglutide]     KIDNEY INFECTION,INABILITY TO HAVE BOWEL MOVEMENTS,BLOOD IN URINE   Adhesive [Tape]     Latex, band aids  causes whelts and blisters   Hydrocodone-Acetaminophen Itching    Tolerates small/ infrequent doses   Latex Other (See Comments)    Band-aids causes whelts and blisters   Oxycontin [Oxycodone Hcl]     "FELT LIKE HEAD WAS GOING TO EXPLODE"   Ciprofloxacin Rash    Yeast infection and a severe rash    Codeine Itching and Rash   Other Palpitations    Steroids      "makes me feel like I'm having a heart attack. -     Current Outpatient Medications:    acetaminophen (TYLENOL) 500 MG tablet, Take 1,000 mg by mouth every 6 (six) hours as needed for moderate pain., Disp: , Rfl:    levothyroxine (SYNTHROID)  150 MCG tablet, Take 150 mcg by mouth See admin instructions. Take 175 mcg by mouth daily, except on Tuesday take 262.5 mcg, Disp: , Rfl:    linaclotide (LINZESS) 290 MCG CAPS capsule, Take 290 mcg by mouth daily before breakfast., Disp: , Rfl:    midodrine (PROAMATINE) 5 MG tablet, Take 1 tablet (5 mg total) by mouth 3 (three) times daily with meals. While awake. Avoid laying down for 3 hours, Disp: 90 tablet, Rfl: 1   nitroGLYCERIN (NITROSTAT) 0.4 MG SL tablet, Place 1 tablet (0.4 mg total) under the tongue every 5 (five) minutes as needed for chest pain., Disp: 25 tablet, Rfl: 2   omeprazole (PRILOSEC) 40 MG capsule, Take 40 mg by mouth daily., Disp: , Rfl:    polyethylene glycol (MIRALAX / GLYCOLAX) 17 g packet, Take 17 g by mouth daily., Disp: , Rfl:    rosuvastatin (CRESTOR) 5 MG tablet, TAKE ONE TABLET BY MOUTH DAILY, Disp: 90 tablet, Rfl: 3   Radiology:   CT angiogram chest 10/07/2021: There  is mild aortic atherosclerosis, no dissection. Fusiform aneurysmal dilatation of the ascending aorta, maximal dimension 4.2 cm. Recommend annual imaging followup by CTA or MRA. Tiny hiatal hernia.  CXR 2 view 01/02/2022: There is elevation of the left hemidiaphragm with left basilar atelectasis. There is gaseous distention of the stomach. This is increased compared to prior CT. No pleural effusion or pneumothorax identified. Cardiomediastinal silhouette is within normal limits. Patient is status post cardiac valve replacement.  Cardiac Studies:   Carotid artery duplex 03/20/2020: The bifurcation, internal, external and common carotid arteries reveal no evidence of significant stenosis, bilaterally. Antegrade right vertebral artery flow. Antegrade left vertebral artery flow.   Comparison(s): No significant change from prior study. 09/06/2021. Previously LV was mildly dilated at 5.7 cm now 6.0 cm.  Right & Left Heart Catheterization 11/08/21:  LV: 134/9, EDP 21 mmHg.  Ao 130/52, mean 97 mmHg.   Pulse pressure 82 mmHg.  Moderately elevated LVEDP. LM: Large vessel: Smooth and normal. LAD: Large caliber vessel.  Gives origin to a moderate-sized D1 which immediately bifurcates into secondary branch.  Ostium has 85% focal stenosis.  D2 is moderate size, mild disease.  Mid segment of the LAD after D2 has 10 to 20% stenosis. CX: Gives origin to a small high OM1 followed by a moderate to large sized OM 2 and small OM 3 and OM 4.  AV groove circumflex is small to moderate-sized vessel with the ostium 10 to 20% stenosis. RCA: Dominant.  Mildly ectatic in the proximal segment.  Mild disease of 10 to 20% in the midsegment.  Slow flow in the right coronary artery. Ascending aortogram: There is moderate dilatation of the ascending aorta.  Aortic regurgitation is evident, severity was not assessed as it was a hand contrast injection.  Right heart: RA 12/9, mean 8 mmHg RV 32/3, EDP 16 mmHg PA 27/6, mean 16 mmHg PA saturation 80%. PW: Not obtained as I had difficulty in advancing the PA catheter to the wedge position due to tortuosity of the peripheral vessels. CO 11.19 and CI 4.57.  QP/QS 1.00.  Recommendation: Patient will be reassessed by Dr. Darcey Nora for consideration of Bentall procedure.  D1 is small to moderate size, will discuss with him regarding revascularization versus medical therapy only.  Echocardiogram 01/13/2022: 1. Left ventricle cavity is normal in size and wall thickness. Abnormal septal wall motion due to postoperative valve. Normal LV systolic function with EF 56%. Normal diastolic filling pattern. 2. Left atrial cavity is mildly dilated. 3. Well seated bioprosthetic aortic valve (Inspiris resilia size 73m, per op note). Leaflets not well visualized. No significant aortic regurgitation. mean PG 17 mmHg slightly higher than intraoperative TEE. No significant stenosis. 4. Mild (Grade I) mitral regurgitation. 5. Mild tricuspid regurgitation. 6. The aortic root is normal. Mildly  dilated ascending aorta at 4.1 cm. 7. No evidence of pulmonary hypertension. 8. Compared to pre-operative TTE dates 11/22/2021, bioprosthetic valve is new. LV dilatation, mod-severe AI is resolved.  EKG:   EKG 08/09/2022: Sinus tachycardia at the rate of 103 bpm, left axis deviation, left anterior fascicular block.  IRBBB normal poor R progression, cannot exclude anteroseptal infarct old.  LVH.  Nonspecific ST-T abnormality.  Compared to 07/12/2022, no significant change, PAC and PVC not present.   Assessment     ICD-10-CM   1. Hypotensive syncope  R55     2. Orthostatic hypotension  I95.1 midodrine (PROAMATINE) 5 MG tablet    3. Essential hypertension  I10 EKG 12-Lead    4. Aortic  root dilatation (HCC)  I77.810       Medications Discontinued During This Encounter  Medication Reason   losartan (COZAAR) 25 MG tablet    metoprolol succinate (TOPROL XL) 25 MG 24 hr tablet    torsemide (DEMADEX) 20 MG tablet    diclofenac (CATAFLAM) 50 MG tablet Completed Course   amoxicillin (AMOXIL) 500 MG tablet Completed Course    Meds ordered this encounter  Medications   midodrine (PROAMATINE) 5 MG tablet    Sig: Take 1 tablet (5 mg total) by mouth 3 (three) times daily with meals. While awake. Avoid laying down for 3 hours    Dispense:  90 tablet    Refill:  1   Orders Placed This Encounter  Procedures   EKG 12-Lead    Recommendations:   Joanna Alvarado is a 67 y.o.  female with a past medical history of hypertension, severe aortic regurgitation S/P BIOPROSTHETIC AV replacement 11/24/2021, controlled diabetes mellitus, chronic thrombocytopenia, morbid obesity and OSA on CPAP, GERD, hiatal hernia, hypercholesterolemia, hypothyroidism and chronic stable angina with normal coronary arteries/minimal coronary artery disease by angiography in April 2021. She was seen on 07/12/2022 with severe lightheadedness and near syncope and was diagnosed with hypotension   1. Hypotensive syncope Patient's  episodes of syncope is related to orthostatic hypotension.  She has classic symptoms of orthostasis.  Previously she was hypertensive and was on blood pressure medications all of this has been discontinued.  I do not know the exact etiology for her orthostasis.  Do not suspect significant arrhythmias or cardiac ischemia as an etiology.  Will repeat echocardiogram to follow-up on both aortic root dilatation and also aortic valve replacement.  She also has markedly reduced TSH, I am not sure whether her hypotensive effects are related to thyroid issues as well.  2. Orthostatic hypotension Her orthostasis may be contributed by severe anemia as well, previously was getting iron infusions.  She does have chronic thrombocytopenia and mildly reduced hemoglobin.  No history of GI bleed, I have prescribed her to 25 mg p.o. 3 times daily to be taken with food while awake.  She is aware that not to lay down after she takes the medicine at least for 2 to 3 hours.  Advised her to sleep reclined.  3. Essential hypertension Essential hypertension is now resolved   4. Aortic root dilatation (HCC) As dictated above, will obtain repeat echocardiogram.  5. S/P aortic valve replacement with bioprosthetic valve She is SP aortic valve replacement, no change in the heart murmur.  She does need endocarditis prophylaxis.  I would like to see her back in 6 weeks with repeat orthostasis.    Adrian Prows, MD, [facility] 08/09/2022, 2:56 PM Office: [phone] Fax: [phone] Pager: [phone]

## 2022-08-12 ENCOUNTER — Telehealth: Payer: Self-pay | Admitting: Cardiology

## 2022-08-12 ENCOUNTER — Encounter: Payer: Self-pay | Admitting: Cardiology

## 2022-08-12 DIAGNOSIS — I951 Orthostatic hypotension: Secondary | ICD-10-CM | POA: Diagnosis not present

## 2022-08-12 NOTE — Telephone Encounter (Signed)
ON-CALL CARDIOLOGY 08/12/22  Patient's name: Joanna Alvarado.   MRN: [id number].    DOB: Mar 16, 1956 Primary care provider: Ronita Hipps, MD. Primary cardiologist: Dr. Adrian Prows  Interaction regarding this patient's care today: She called the on-call service to discuss blood pressure and headaches.  Due to symptoms of orthostasis was recently placed on midodrine.  While sitting her SBP ranges between 98-105 mmHg and DBP is between 58 and 61 mmHg.  While standing SBP usually <130 mmHg.  Patient was wondering if these blood pressures could be the cause of her headaches.  Impression:   ICD-10-CM   1. Orthostatic hypotension  I95.1       Recommendations: Reassured patient that her current blood pressure should not be the cause of her headaches.  Would recommend low-dose NSAID or Tylenol to see if her symptoms improve.  No history of migraines.  No focal neurological deficits.  Medications reconciled.  Educated the patient that the midodrine should not cause headaches at the current blood pressure.  If the headaches worsen in intensity frequency or duration and/or develops focal neurological deficits she should go to the ED for further evaluation.   Telephone encounter total time: 7 minutes  Mechele Claude [facility]  Pager:  [phone] Office: [phone]

## 2022-08-14 NOTE — Telephone Encounter (Signed)
From patient.

## 2022-08-23 ENCOUNTER — Ambulatory Visit: Payer: HMO | Admitting: Neurology

## 2022-09-13 ENCOUNTER — Ambulatory Visit: Payer: PPO

## 2022-09-13 ENCOUNTER — Encounter: Payer: Self-pay | Admitting: Oncology

## 2022-09-13 DIAGNOSIS — R55 Syncope and collapse: Secondary | ICD-10-CM | POA: Diagnosis not present

## 2022-09-13 DIAGNOSIS — Z953 Presence of xenogenic heart valve: Secondary | ICD-10-CM

## 2022-09-13 DIAGNOSIS — I77810 Thoracic aortic ectasia: Secondary | ICD-10-CM | POA: Diagnosis not present

## 2022-09-21 ENCOUNTER — Encounter: Payer: Self-pay | Admitting: Cardiology

## 2022-09-21 ENCOUNTER — Ambulatory Visit: Payer: PPO | Admitting: Cardiology

## 2022-09-21 VITALS — BP 131/81 | HR 111 | Ht 68.0 in | Wt 306.0 lb

## 2022-09-21 DIAGNOSIS — I77810 Thoracic aortic ectasia: Secondary | ICD-10-CM

## 2022-09-21 DIAGNOSIS — I951 Orthostatic hypotension: Secondary | ICD-10-CM

## 2022-09-21 DIAGNOSIS — Z953 Presence of xenogenic heart valve: Secondary | ICD-10-CM | POA: Diagnosis not present

## 2022-09-21 DIAGNOSIS — Z6841 Body Mass Index (BMI) 40.0 and over, adult: Secondary | ICD-10-CM | POA: Diagnosis not present

## 2022-09-21 MED ORDER — FLUDROCORTISONE ACETATE 0.1 MG PO TABS
0.1000 mg | ORAL_TABLET | Freq: Every evening | ORAL | 6 refills | Status: DC
Start: 2022-09-21 — End: 2023-06-15

## 2022-09-21 MED ORDER — MIDODRINE HCL 5 MG PO TABS
2.5000 mg | ORAL_TABLET | Freq: Three times a day (TID) | ORAL | Status: DC | PRN
Start: 2022-09-21 — End: 2022-10-16

## 2022-09-21 NOTE — Progress Notes (Signed)
Primary Physician/Referring:  Marylen Ponto, MD  Patient ID: Joanna Alvarado, female    DOB: 17-Mar-1956, 67 y.o.   MRN: [id number]  Chief Complaint  Patient presents with   Orthostatic hypotension   Follow-up    6 weeks   HPI:    Joanna Alvarado  is a 67 y.o. ffemale with a past medical history of hypertension, severe aortic regurgitation S/P BIOPROSTHETIC AV replacement 11/24/2021, chronic thrombocytopenia, morbid obesity and OSA on CPAP, GERD, hiatal hernia, hypercholesterolemia, hypothyroidism and chronic stable angina with normal coronary arteries/minimal coronary artery disease by angiography in April 2021, new onset orthostatic hypotension, all antihypertensive medications were discontinued due to recurrent syncope/near syncope.  She now presents for a 6-week office visit.  I started her on midodrine which she is unable to tolerate due to severe headaches.  She reduce the dose to 2.5 mg dose in spite of this she has not been able to tolerate this.  She does state that her symptoms of near syncope and syncope has improved significantly since being on midodrine.  She is accompanied by her husband.   Otherwise denies chest pain, no change in shortness of breath, denies leg edema.     Past Surgical History:  Procedure Laterality Date   ABDOMINAL HYSTERECTOMY     AORTIC VALVE REPLACEMENT N/A 11/24/2021   Procedure: AORTIC VALVE REPLACEMENT (AVR) USING INSPIRIS RESILIA  AORTIC VALVE;  Surgeon: Lovett Sox, MD;  Location: [facility] OR;  Service: Open Heart Surgery;  Laterality: N/A;   APPENDECTOMY     CHOLECYSTECTOMY     COLONOSCOPY N/A 07/27/2015   Procedure: COLONOSCOPY;  Surgeon: Hilarie Fredrickson, MD;  Location: [facility] ENDOSCOPY;  Service: Endoscopy;  Laterality: N/A;   COLONOSCOPY WITH PROPOFOL N/A 04/28/2019   Procedure: COLONOSCOPY WITH PROPOFOL;  Surgeon: Hilarie Fredrickson, MD;  Location: [facility] ENDOSCOPY;  Service: Endoscopy;  Laterality: N/A;   EYE SURGERY     bilateral cataracts with lens  implants   floater bone surgery     bone removed from right hand   FOOT SURGERY     RIGHT   FOOT SURGERY Left    triple orthodesis   left foot surgery     removed tendon and placed pins in foot   LEFT HEART CATH AND CORONARY ANGIOGRAPHY N/A 09/26/2019   Procedure: LEFT HEART CATH AND CORONARY ANGIOGRAPHY;  Surgeon: Marykay Lex, MD;  Location: [facility] INVASIVE CV LAB;  Service: Cardiovascular;  Laterality: N/A;   PLANTAR FASCIA SURGERY     both feet   POLYPECTOMY  04/28/2019   Procedure: POLYPECTOMY;  Surgeon: Hilarie Fredrickson, MD;  Location: [facility] ENDOSCOPY;  Service: Endoscopy;;   RIGHT HEART CATH N/A 02/15/2021   Procedure: RIGHT HEART CATH;  Surgeon: Elder Negus, MD;  Location: [facility] INVASIVE CV LAB;  Service: Cardiovascular;  Laterality: N/A;   RIGHT/LEFT HEART CATH AND CORONARY ANGIOGRAPHY N/A 11/08/2021   Procedure: RIGHT/LEFT HEART CATH AND CORONARY ANGIOGRAPHY;  Surgeon: Yates Decamp, MD;  Location: [facility] INVASIVE CV LAB;  Service: Cardiovascular;  Laterality: N/A;   TEE WITHOUT CARDIOVERSION N/A 11/24/2021   Procedure: TRANSESOPHAGEAL ECHOCARDIOGRAM (TEE);  Surgeon: Lovett Sox, MD;  Location: [facility] OR;  Service: Open Heart Surgery;  Laterality: N/A;   TONSILLECTOMY      Social History   Tobacco Use   Smoking status: Never   Smokeless tobacco: Never  Substance Use Topics   Alcohol use: Never   Marital Status: Married   ROS  Review of Systems  Cardiovascular:  Positive for dyspnea on exertion (stable, improving). Negative for chest pain, leg swelling and orthopnea.  Gastrointestinal:  Negative for nausea and vomiting.  Neurological:  Positive for light-headedness.   Objective  Blood pressure 131/81, pulse (!) 111, height 5\' 8"  (1.727 m), weight (!) 306 lb (138.8 kg), SpO2 96 %.      09/21/2022   12:53 PM 08/09/2022    1:20 PM 07/12/2022    1:17 PM  Vitals with BMI  Height 5\' 8"  5\' 8"    Weight 306 lbs 303 lbs 3 oz   BMI 46.54 46.11   Systolic 131 121 295  Diastolic 81 76  128  Pulse 111 114 135    Orthostatic VS for the past 72 hrs (Last 3 readings):  Orthostatic BP Patient Position BP Location Cuff Size Orthostatic Pulse  09/21/22 1300 129/87 Standing Left Arm Large 111  09/21/22 1259 131/64 Sitting Left Arm Large 111  09/21/22 1258 (!) 139/94 Supine Left Arm Large 116     Physical Exam Constitutional:      Appearance: She is morbidly obese.     Comments: Morbidly obese. No acute distress.  Neck:     Vascular: No JVD.  Cardiovascular:     Rate and Rhythm: Normal rate and regular rhythm.     Pulses: Intact distal pulses.          Radial pulses are 2+ on the right side and 2+ on the left side.       Popliteal pulses are 0 on the right side and 0 on the left side.       Dorsalis pedis pulses are 1+ on the right side and 1+ on the left side.       Posterior tibial pulses are 0 on the right side and 0 on the left side.     Heart sounds: Murmur heard.     Low-pitched harsh midsystolic murmur is present at the upper right sternal border. Soft murmur through AV     No gallop.  Pulmonary:     Effort: Pulmonary effort is normal.     Breath sounds: Normal breath sounds.  Abdominal:     General: Bowel sounds are normal.     Palpations: Abdomen is soft.     Comments: Large pannus  Musculoskeletal:     Right lower leg: No edema.     Left lower leg: No edema.    Laboratory examination:   Recent Labs    11/28/21 0308 11/29/21 0210 01/03/22 1206 04/10/22 1500 04/17/22 1016  NA 139 140 143 146* 147*  K 4.1 4.0 4.0 4.1 3.6  CL 108 109 110* 109* 111  CO2 24 23 20 22 24   GLUCOSE 103* 108* 97 97 101*  BUN 33* 27* 16 16 20   CREATININE 1.17* 1.01* 1.10* 1.09* 1.27*  CALCIUM 8.7* 8.8* 9.5 9.5 9.4  GFRNONAA 51* >60  --   --  46*       Latest Ref Rng & Units 04/17/2022   10:16 AM 04/10/2022    3:00 PM 01/03/2022   12:06 PM  CMP  Glucose 70 - 99 mg/dL 621  97  97   BUN 8 - 23 mg/dL 20  16  16    Creatinine 0.44 - 1.00 mg/dL 3.08  6.57  8.46    Sodium 135 - 145 mmol/L 147  146  143   Potassium 3.5 - 5.1 mmol/L 3.6  4.1  4.0   Chloride 98 - 111 mmol/L 111  109  110   CO2 22 - 32 mmol/L 24  22  20    Calcium 8.9 - 10.3 mg/dL 9.4  9.5  9.5   Total Protein 6.5 - 8.1 g/dL 5.9   5.4   Total Bilirubin 0.3 - 1.2 mg/dL 1.0   0.6   Alkaline Phos 38 - 126 U/L 109   115   AST 15 - 41 U/L 34   31   ALT 0 - 44 U/L 17   11       Latest Ref Rng & Units 03/13/2022    3:32 PM 01/03/2022   12:08 PM 11/29/2021    2:10 AM  CBC  WBC 3.4 - 10.8 x10E3/uL 4.4  3.6  6.9   Hemoglobin 11.1 - 15.9 g/dL 78.2  95.6  21.3   Hematocrit 34.0 - 46.6 % 34.2  35.7  29.5   Platelets 150 - 450 x10E3/uL 87  88  77    Lab Results  Component Value Date   CHOL 119 09/30/2019   HDL 37 (L) 09/30/2019   LDLCALC 63 09/30/2019   TRIG 103 09/30/2019   CHOLHDL 3.2 09/30/2019      External labs:   Labs 08/04/2022:  TSH markedly reduced at 0.06.  T4 elevated at 2.2.  Free T3 decreased at 2.08.  Vitamin D 52.  A1c 5.7%.  Labs 04/17/2022:  Sodium 147, potassium 3.6, BUN 20, creatinine 1.27, EGFR 46 mL.  Hb 11.1/HCT 34.2, platelets 87.  Radiology:   CT angiogram chest 10/07/2021: There is mild aortic atherosclerosis, no dissection. Fusiform aneurysmal dilatation of the ascending aorta, maximal dimension 4.2 cm. Recommend annual imaging followup by CTA or MRA. Tiny hiatal hernia.  CXR 2 view 01/02/2022: There is elevation of the left hemidiaphragm with left basilar atelectasis. There is gaseous distention of the stomach. This is increased compared to prior CT. No pleural effusion or pneumothorax identified. Cardiomediastinal silhouette is within normal limits. Patient is status post cardiac valve replacement.  Cardiac Studies:   Carotid artery duplex 03-18-20: The bifurcation, internal, external and common carotid arteries reveal no evidence of significant stenosis, bilaterally. Antegrade right vertebral artery flow. Antegrade left vertebral artery flow.    Comparison(s): No significant change from prior study. 09/06/2021. Previously LV was mildly dilated at 5.7 cm now 6.0 cm.  Right & Left Heart Catheterization 11/08/21:  LV: 134/9, EDP 21 mmHg.  Ao 130/52, mean 97 mmHg.  Pulse pressure 82 mmHg.  Moderately elevated LVEDP. LM: Large vessel: Smooth and normal. LAD: Large caliber vessel.  Gives origin to a moderate-sized D1 which immediately bifurcates into secondary branch.  Ostium has 85% focal stenosis.  D2 is moderate size, mild disease.  Mid segment of the LAD after D2 has 10 to 20% stenosis. CX: Gives origin to a small high OM1 followed by a moderate to large sized OM 2 and small OM 3 and OM 4.  AV groove circumflex is small to moderate-sized vessel with the ostium 10 to 20% stenosis. RCA: Dominant.  Mildly ectatic in the proximal segment.  Mild disease of 10 to 20% in the midsegment.  Slow flow in the right coronary artery. Ascending aortogram: There is moderate dilatation of the ascending aorta.  Aortic regurgitation is evident, severity was not assessed as it was a hand contrast injection.  Right heart: RA 12/9, mean 8 mmHg RV 32/3, EDP 16 mmHg PA 27/6, mean 16 mmHg PA saturation 80%. PW: Not obtained as I had difficulty in advancing the PA catheter to the  wedge position due to tortuosity of the peripheral vessels. CO 11.19 and CI 4.57.  QP/QS 1.00.  Recommendation: Patient will be reassessed by Dr. Maren Beach for consideration of Bentall procedure.  D1 is small to moderate size, will discuss with him regarding revascularization versus medical therapy only.  Echocardiogram 09/13/2022:  Normal LV systolic function with visual EF 60-65%. Left ventricle cavity is normal in size. Mild concentric hypertrophy of the left ventricle. Normal global wall motion. Normal diastolic filling pattern, normal LAP. Calculated EF 65%. Left atrial cavity is moderately dilated at 4.8 cm. Well seated bioprosthetic aortic valve (Inspiris resilia size 25mm, per  op note). Leaflets not well visualized. No significant aortic regurgitation Structurally normal tricuspid valve.  Mild tricuspid regurgitation. No evidence of pulmonary hypertension. RVSP measures 31 mmHg. The aortic root is mildly dilated at 4.0 cm. No significant change compared to 09/2021.  EKG:   EKG 08/09/2022: Sinus tachycardia at the rate of 103 bpm, left axis deviation, left anterior fascicular block.  IRBBB normal poor R progression, cannot exclude anteroseptal infarct old.  LVH.  Nonspecific ST-T abnormality.  Compared to 07/12/2022, no significant change, PAC and PVC not present.   Medications and allergies   Allergies  Allergen Reactions   Metamucil [Psyllium] Other (See Comments)    Severe constipation   Trulicity [Dulaglutide]     KIDNEY INFECTION,INABILITY TO HAVE BOWEL MOVEMENTS,BLOOD IN URINE   Adhesive [Tape]     Latex, band aids  causes whelts and blisters   Hydrocodone-Acetaminophen Itching    Tolerates small/ infrequent doses   Latex Other (See Comments)    Band-aids causes whelts and blisters   Oxycontin [Oxycodone Hcl]     "FELT LIKE HEAD WAS GOING TO EXPLODE"   Ciprofloxacin Rash    Yeast infection and a severe rash    Codeine Itching and Rash   Other Palpitations    Steroids      "makes me feel like I'm having a heart attack. -    Current Outpatient Medications:    acetaminophen (TYLENOL) 500 MG tablet, Take 1,000 mg by mouth every 6 (six) hours as needed for moderate pain., Disp: , Rfl:    fludrocortisone (FLORINEF) 0.1 MG tablet, Take 1 tablet (0.1 mg total) by mouth every evening., Disp: 30 tablet, Rfl: 6   levothyroxine (SYNTHROID) 150 MCG tablet, Take 150 mcg by mouth See admin instructions. Take 175 mcg by mouth daily, except on Tuesday take 262.5 mcg, Disp: , Rfl:    lidocaine (LIDODERM) 5 %, Place 1 patch onto the skin daily. Remove & Discard patch within 12 hours or as directed by MD, Disp: , Rfl:    linaclotide (LINZESS) 290 MCG CAPS capsule, Take  290 mcg by mouth daily before breakfast., Disp: , Rfl:    nitroGLYCERIN (NITROSTAT) 0.4 MG SL tablet, Place 1 tablet (0.4 mg total) under the tongue every 5 (five) minutes as needed for chest pain., Disp: 25 tablet, Rfl: 2   omeprazole (PRILOSEC) 40 MG capsule, Take 40 mg by mouth daily., Disp: , Rfl:    polyethylene glycol (MIRALAX / GLYCOLAX) 17 g packet, Take 17 g by mouth daily., Disp: , Rfl:    rosuvastatin (CRESTOR) 5 MG tablet, TAKE ONE TABLET BY MOUTH DAILY, Disp: 90 tablet, Rfl: 3   midodrine (PROAMATINE) 5 MG tablet, Take 0.5 tablets (2.5 mg total) by mouth 3 (three) times daily as needed (If anticipating being on feet for long). While awake. Avoid laying down for 3 hours, Disp: , Rfl:  Assessment     ICD-10-CM   1. Orthostatic hypotension  I95.1 fludrocortisone (FLORINEF) 0.1 MG tablet    midodrine (PROAMATINE) 5 MG tablet    2. Aortic root dilatation  I77.810     3. S/P aortic valve replacement with bioprosthetic valve  Z95.3     4. Class 3 severe obesity due to excess calories with serious comorbidity and body mass index (BMI) of 45.0 to 49.9 in adult  E66.01    Z68.42      Medications Discontinued During This Encounter  Medication Reason   midodrine (PROAMATINE) 5 MG tablet Reorder     Meds ordered this encounter  Medications   fludrocortisone (FLORINEF) 0.1 MG tablet    Sig: Take 1 tablet (0.1 mg total) by mouth every evening.    Dispense:  30 tablet    Refill:  6   midodrine (PROAMATINE) 5 MG tablet    Sig: Take 0.5 tablets (2.5 mg total) by mouth 3 (three) times daily as needed (If anticipating being on feet for long). While awake. Avoid laying down for 3 hours   No orders of the defined types were placed in this encounter.   Recommendations:   Joanna Alvarado is a 67 y.o.  female with a past medical history of hypertension, severe aortic regurgitation S/P BIOPROSTHETIC AV replacement 11/24/2021, chronic thrombocytopenia, morbid obesity and OSA on CPAP, GERD,  hiatal hernia, hypercholesterolemia, hypothyroidism and chronic stable angina with normal coronary arteries/minimal coronary artery disease by angiography in April 2021, new onset orthostatic hypotension, all antihypertensive medications were discontinued due to recurrent syncope/near syncope.  She now presents for a 6-week office visit.  1. Orthostatic hypotension Patient with orthostatic hypotension, I suspect it is probably related to pickwickian leg syndrome, related to decreased venous return from morbid obesity.  I do not have any other explanation for her orthostasis.  She could not tolerate midodrine due to severe headache, advised her to use it only on a as needed basis along with prophylactic Tylenol.  Will try fludrocortisone, I have discussed with her regarding use of support stockings on a daily basis, side effects of fludrocortisone including weight gain, fluid retention and leg edema.  I do believe that she needs to address her obesity, she is presently weighing 306 pounds with a BMI of 46+.  She could probably consider going on a GLP-1 agonist.  She does have hypothyroidism and being repleted by thyroxine, recent TSH has been reduced and dosage was adjusted, she has an appointment coming up with her endocrinologist and I have encouraged her to discuss this.  - fludrocortisone (FLORINEF) 0.1 MG tablet; Take 1 tablet (0.1 mg total) by mouth every evening.  Dispense: 30 tablet; Refill: 6 - midodrine (PROAMATINE) 5 MG tablet; Take 0.5 tablets (2.5 mg total) by mouth 3 (three) times daily as needed (If anticipating being on feet for long). While awake. Avoid laying down for 3 hours  2. Aortic root dilatation Very mild aortic root dilatation SP aortic valve replacement, continue observation for now.  3. S/P aortic valve replacement with bioprosthetic valve I reviewed her echocardiogram, normal aortic valve function, normal LVEF.  4. Class 3 severe obesity due to excess calories with  serious comorbidity and body mass index (BMI) of 45.0 to 49.9 in adult As dictated above, could consider starting her on GLP-1 agonist for weight loss.  She is otherwise stable from cardiac standpoint, I will see her back on a annual basis.   Yates Decamp, MD, [facility] 09/21/2022, 1:31  PM Office: [phone] Fax: [phone] Pager: [phone]

## 2022-09-22 DIAGNOSIS — Z1231 Encounter for screening mammogram for malignant neoplasm of breast: Secondary | ICD-10-CM | POA: Diagnosis not present

## 2022-10-03 ENCOUNTER — Telehealth: Payer: Self-pay

## 2022-10-03 NOTE — Telephone Encounter (Signed)
Patient has been called and is aware.

## 2022-10-03 NOTE — Telephone Encounter (Addendum)
Patient says Florinef is making her agitated and she can't sleep. She wants to know if should continue the medication or is there something else she can take?

## 2022-10-03 NOTE — Telephone Encounter (Signed)
Not sure it is the medication. She can try taking this in the late afternoon

## 2022-10-04 ENCOUNTER — Encounter: Payer: Self-pay | Admitting: Internal Medicine

## 2022-10-05 ENCOUNTER — Encounter: Payer: Self-pay | Admitting: Cardiology

## 2022-10-05 ENCOUNTER — Encounter: Payer: Self-pay | Admitting: Internal Medicine

## 2022-10-06 NOTE — Telephone Encounter (Signed)
From patient.

## 2022-10-08 IMAGING — CT CT ANGIO CHEST
1 of 3 series · 12 of 32 positions shown · IV contrast (APPLIED)
Comparison: None Available. No prior chest CT. Radiograph
07/04/2019 reviewed

CLINICAL DATA: Aortic root dilatation. Aortic aneurysm, known or
suspected.

EXAM:
CT ANGIOGRAPHY CHEST WITH CONTRAST
TECHNIQUE: Multidetector CT imaging of the chest was performed using the
standard protocol during bolus administration of intravenous
contrast. Multiplanar CT image reconstructions and MIPs were
obtained to evaluate the vascular anatomy.

[Series 9: thins · axial · 0.89mm/px · z∈[-323,-64]mm · 12 of 384 slices shown]
[im 30/384  lung]
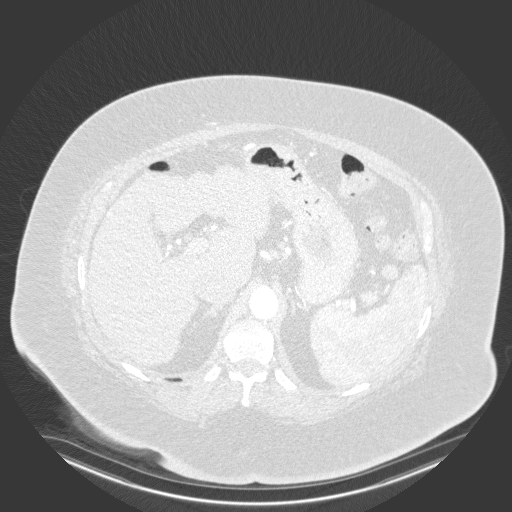
[im 59/384  soft-tissue]
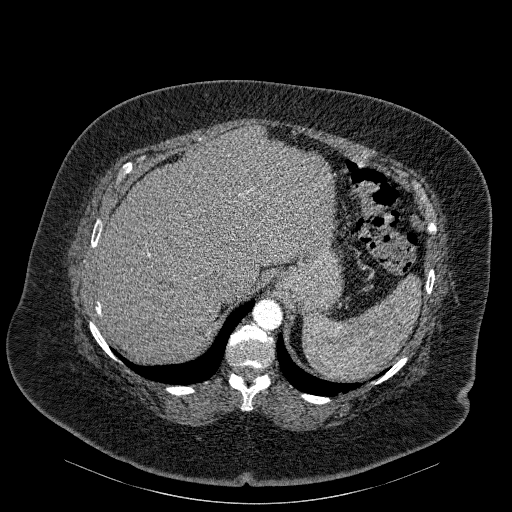
[im 89/384  lung]
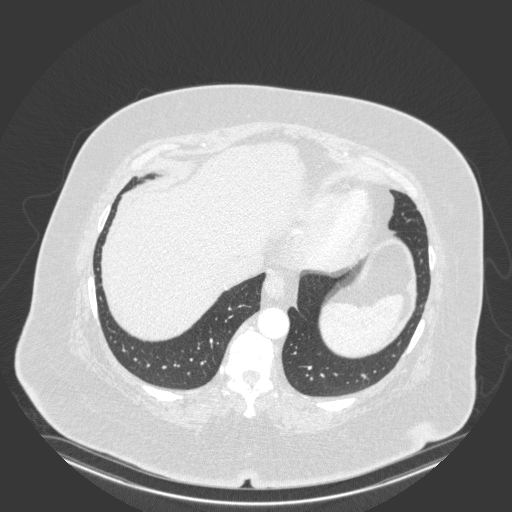
[im 118/384  soft-tissue]
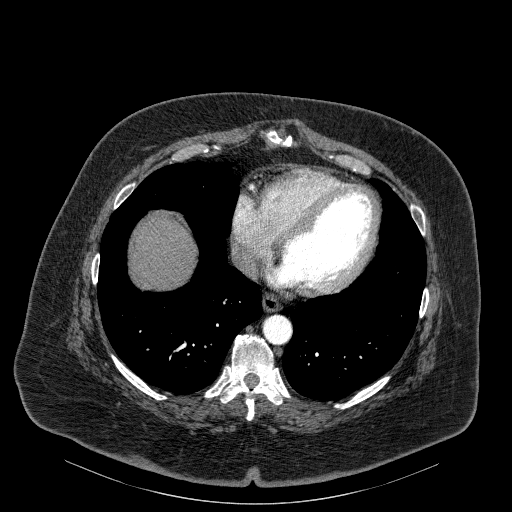
[im 148/384  lung]
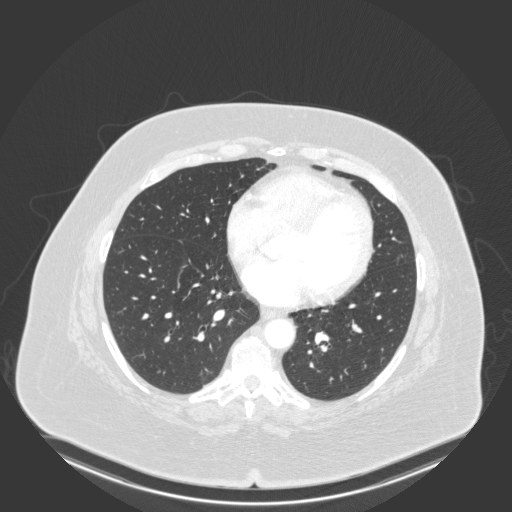
[im 177/384  soft-tissue]
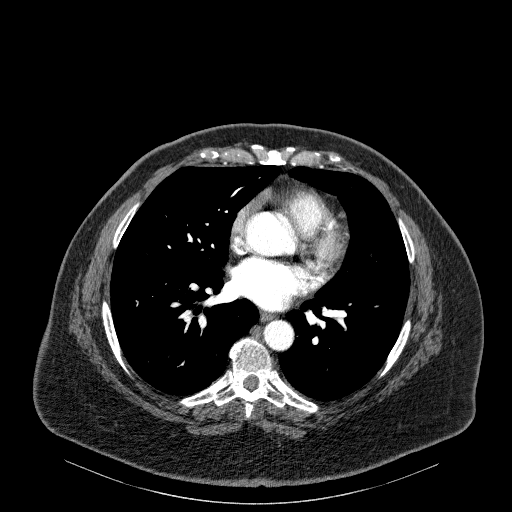
[im 207/384  lung]
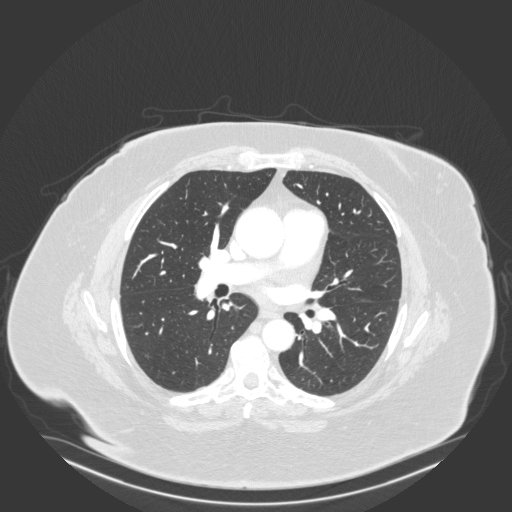
[im 236/384  soft-tissue]
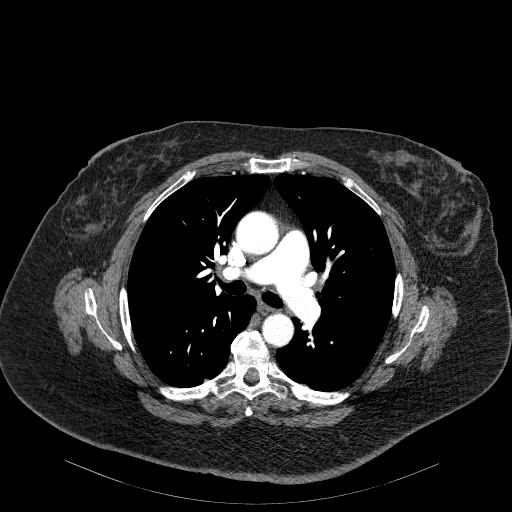
[im 266/384  lung]
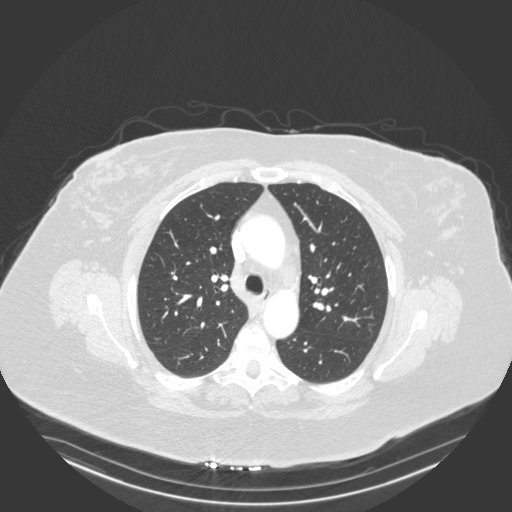
[im 295/384  soft-tissue]
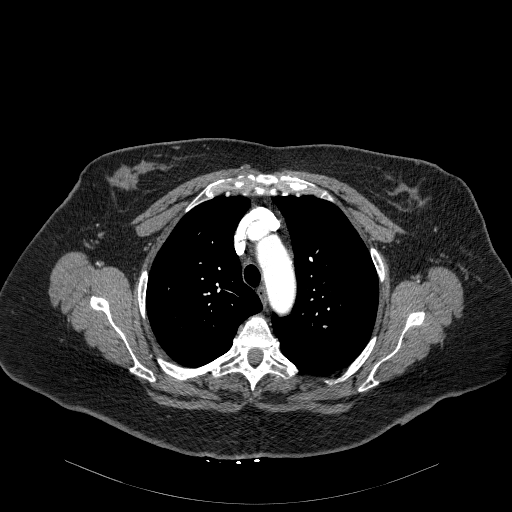
[im 325/384  lung]
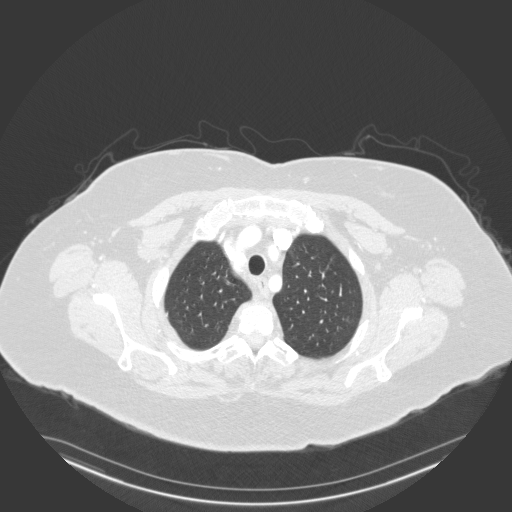
[im 354/384  soft-tissue]
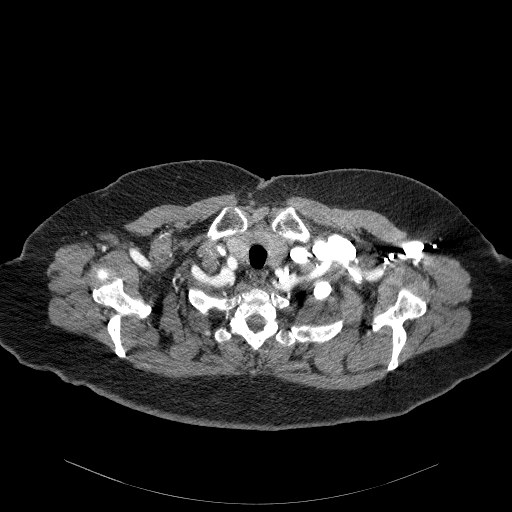

[12 of 32 positions shown; findings below may reference images not displayed]

RADIATION DOSE REDUCTION: This exam was performed according to the
departmental dose-optimization program which includes automated
exposure control, adjustment of the mA and/or kV according to
patient size and/or use of iterative reconstruction technique.

CONTRAST:  75mL [id number] IOPAMIDOL (BMSL54-UE9) INJECTION 76%

Creatinine was obtained on site at [HOSPITAL] at [HOSPITAL].

Results: Creatinine 1.1 mg/dL.
FINDINGS: Cardiovascular: There is mild aortic atherosclerosis. No dissection
or evidence of aortic inflammation. Aortic measurements as described
below. The heart is normal in size. There is no pericardial
effusion. Exam not tailored to pulmonary artery assessment, allowing
for this, there is no pulmonary embolus.

Aortic measurements

At the aortic leaflets: 4.1 cm

Sinuses of Valsalva: 3.2 cm

Mid ascending aorta 4.2 cm

Mid transverse colon 2.8 cm

Mid descending 2.7 cm

Mediastinum/Nodes: No mediastinal or hilar adenopathy. No enlarged
axillary nodes. No visualized thyroid nodule. Tiny hiatal hernia.

Lungs/Pleura: No focal airspace disease or pleural effusion. Minor
subsegmental atelectasis in the lingula. Slight scarring in the
dependent left lower lobe. No pulmonary mass or suspicious nodule.
The trachea and central bronchi are patent.

Upper Abdomen: 2 peripherally calcified lesions in the right lobe of
the liver, larger measuring 17 mm, series 4, image 19, unchanged
from prior exam. There is also a vague subcapsular calcifications in
the right lobe, series 4, image 99. This is also unchanged from
prior exam. No further follow-up is needed. Cholecystectomy. No
acute upper abdominal findings.

Musculoskeletal: Thoracic spondylosis with anterior spurring There
are no acute or suspicious osseous abnormalities. No chest wall soft
tissue abnormalities

Review of the MIP images confirms the above findings.
IMPRESSION: 1. Fusiform aneurysmal dilatation of the ascending aorta, maximal
dimension 4.2 cm. Recommend annual imaging followup by CTA or MRA.
This recommendation follows 4040
ACCF/AHA/AATS/ACR/ASA/SCA/KLPIGBB/ZOHIRA/POLIZZI/ZEINAB Guidelines for the
Diagnosis and Management of Patients with Thoracic Aortic Disease.
Circulation. 4040; 121: E266-e369. Aortic aneurysm NOS (3NHGZ-UVM.6)
2. Mild aortic atherosclerosis.
3. Tiny hiatal hernia.

Aortic Atherosclerosis (3NHGZ-NAV.V).

## 2022-10-09 ENCOUNTER — Other Ambulatory Visit: Payer: Self-pay

## 2022-10-09 NOTE — Telephone Encounter (Signed)
Can we try wegovy for her?

## 2022-10-10 ENCOUNTER — Other Ambulatory Visit: Payer: Self-pay

## 2022-10-10 DIAGNOSIS — Z7689 Persons encountering health services in other specified circumstances: Secondary | ICD-10-CM | POA: Diagnosis not present

## 2022-10-10 DIAGNOSIS — R1030 Lower abdominal pain, unspecified: Secondary | ICD-10-CM | POA: Diagnosis not present

## 2022-10-10 DIAGNOSIS — Z6841 Body Mass Index (BMI) 40.0 and over, adult: Secondary | ICD-10-CM | POA: Diagnosis not present

## 2022-10-10 DIAGNOSIS — K5900 Constipation, unspecified: Secondary | ICD-10-CM | POA: Diagnosis not present

## 2022-10-10 MED ORDER — WEGOVY 0.25 MG/0.5ML ~~LOC~~ SOAJ: 0.2500 mg | SUBCUTANEOUS | 3 refills | Status: DC

## 2022-10-10 NOTE — Telephone Encounter (Signed)
Ozempic was denied her A1c was normal so I asked if we could try to get wegovy approved for her ?

## 2022-10-10 NOTE — Telephone Encounter (Signed)
YES

## 2022-10-10 NOTE — Telephone Encounter (Signed)
Done

## 2022-10-12 DIAGNOSIS — S82101A Unspecified fracture of upper end of right tibia, initial encounter for closed fracture: Secondary | ICD-10-CM | POA: Diagnosis not present

## 2022-10-13 ENCOUNTER — Encounter: Payer: Self-pay | Admitting: Oncology

## 2022-10-15 NOTE — Progress Notes (Unsigned)
[facility]  [address] [place],  [place]  [address] [phone]  Clinic Day:  10/16/2022  Referring physician: Marylen Ponto, MD  HISTORY OF PRESENT ILLNESS:  The patient is a 67 y.o. female  with thrombocytopenia.  She was also found to be iron deficient recently for which she received IV iron.   She comes in today to reassess her hemoglobin and low platelets.  Since her last visit, the patient did undergo right knee cap surgery.  From an anemia perspective, the patient is doing well.  She denies having increased fatigue or any overt forms of blood loss.  She continues to deny having any underlying bleeding/bruising issues which concern her for worsening thrombocytopenia being present.    PHYSICAL EXAM:  Blood pressure (!) 142/82, pulse (!) 102, temperature 98.5 F (36.9 C), temperature source Oral, resp. rate 18, height 5\' 8"  (1.727 m), weight (!) 301 lb (136.5 kg), SpO2 95 %. Wt Readings from Last 3 Encounters:  10/16/22 (!) 301 lb (136.5 kg)  09/21/22 (!) 306 lb (138.8 kg)  08/09/22 (!) 303 lb 3.2 oz (137.5 kg)   Body mass index is 45.77 kg/m. Performance status (ECOG): 1 - Symptomatic but completely ambulatory Physical Exam Constitutional:      Appearance: Normal appearance. She is not ill-appearing.     Comments: She ambulates with a walker.  HENT:     Mouth/Throat:     Mouth: Mucous membranes are moist.     Pharynx: Oropharynx is clear. No oropharyngeal exudate or posterior oropharyngeal erythema.  Cardiovascular:     Rate and Rhythm: Normal rate and regular rhythm.     Heart sounds: No murmur heard.    No friction rub. No gallop.  Pulmonary:     Effort: Pulmonary effort is normal. No respiratory distress.     Breath sounds: Normal breath sounds. No wheezing, rhonchi or rales.  Abdominal:     General: Bowel sounds are normal. There is no distension.     Palpations: Abdomen is soft. There is no mass.     Tenderness: There is no  abdominal tenderness.  Musculoskeletal:        General: No swelling.     Right lower leg: No edema.     Left lower leg: No edema.  Lymphadenopathy:     Cervical: No cervical adenopathy.     Upper Body:     Right upper body: No supraclavicular or axillary adenopathy.     Left upper body: No supraclavicular or axillary adenopathy.     Lower Body: No right inguinal adenopathy. No left inguinal adenopathy.  Skin:    General: Skin is warm.     Coloration: Skin is not jaundiced.     Findings: No lesion or rash.  Neurological:     General: No focal deficit present.     Mental Status: She is alert and oriented to person, place, and time. Mental status is at baseline.  Psychiatric:        Mood and Affect: Mood normal.        Behavior: Behavior normal.        Thought Content: Thought content normal.    LABS:     Latest Reference Range & Units 10/16/22 10:06  Sodium 135 - 145 mmol/L 143  Potassium 3.5 - 5.1 mmol/L 3.6  Chloride 98 - 111 mmol/L 112 (H)  CO2 22 - 32 mmol/L 24  Glucose 70 - 99 mg/dL 94  BUN 8 - 23  mg/dL 9  Creatinine 1.61 - 0.96 mg/dL 0.45  Calcium 8.9 - 40.9 mg/dL 81.1 (H)  Anion gap 5 - 15  7  Alkaline Phosphatase 38 - 126 U/L 100  Albumin 3.5 - 5.0 g/dL 3.2 (L)  AST 15 - 41 U/L 41  ALT 0 - 44 U/L 20  Total Protein 6.5 - 8.1 g/dL 5.7 (L)  Total Bilirubin 0.3 - 1.2 mg/dL 1.4 (H)  GFR, Est Non African American >60 mL/min >60  (H): Data is abnormally high (L): Data is abnormally low   Latest Reference Range & Units 10/16/22 10:06  Iron 28 - 170 ug/dL 914  UIBC ug/dL 782  TIBC 956 - 213 ug/dL 086  Saturation Ratios 10.4 - 31.8 % 39 (H)  Ferritin 11 - 307 ng/mL 46  (H): Data is abnormally high  ASSESSMENT & PLAN:  A 67 y.o. female with thrombocythemia and iron deficiency anemia.  I am very pleased with the improvement in her hemoglobin since her IV iron was given.  Her platelet count of 86 today is essentially unchanged versus what it was 6 months ago.  Her  thrombocytopenia will continue to be followed conservatively.   As she is doing well from a hematologic perspective, I will see this patient back in 6 months for repeat clinical assessment.  The patient understands all the plans discussed today and is in agreement with them.  Ahmon Tosi Kirby Funk, MD

## 2022-10-16 ENCOUNTER — Inpatient Hospital Stay: Payer: PPO

## 2022-10-16 ENCOUNTER — Inpatient Hospital Stay: Payer: PPO | Attending: Oncology | Admitting: Oncology

## 2022-10-16 ENCOUNTER — Encounter: Payer: Self-pay | Admitting: Oncology

## 2022-10-16 ENCOUNTER — Telehealth: Payer: Self-pay

## 2022-10-16 ENCOUNTER — Telehealth: Payer: Self-pay | Admitting: Oncology

## 2022-10-16 ENCOUNTER — Other Ambulatory Visit: Payer: Self-pay | Admitting: Oncology

## 2022-10-16 VITALS — BP 142/82 | HR 102 | Temp 98.5°F | Resp 18 | Ht 68.0 in | Wt 301.0 lb

## 2022-10-16 DIAGNOSIS — D696 Thrombocytopenia, unspecified: Secondary | ICD-10-CM | POA: Diagnosis not present

## 2022-10-16 DIAGNOSIS — D509 Iron deficiency anemia, unspecified: Secondary | ICD-10-CM | POA: Diagnosis not present

## 2022-10-16 DIAGNOSIS — D649 Anemia, unspecified: Secondary | ICD-10-CM | POA: Diagnosis not present

## 2022-10-16 DIAGNOSIS — D508 Other iron deficiency anemias: Secondary | ICD-10-CM

## 2022-10-16 DIAGNOSIS — D539 Nutritional anemia, unspecified: Secondary | ICD-10-CM | POA: Diagnosis not present

## 2022-10-16 LAB — IRON AND TIBC
Iron: 146 ug/dL (ref 28–170)
Saturation Ratios: 39 % — ABNORMAL HIGH (ref 10.4–31.8)
TIBC: 378 ug/dL (ref 250–450)
UIBC: 232 ug/dL

## 2022-10-16 LAB — CMP (CANCER CENTER ONLY)
ALT: 20 U/L (ref 0–44)
AST: 41 U/L (ref 15–41)
Albumin: 3.2 g/dL — ABNORMAL LOW (ref 3.5–5.0)
Alkaline Phosphatase: 100 U/L (ref 38–126)
Anion gap: 7 (ref 5–15)
BUN: 9 mg/dL (ref 8–23)
CO2: 24 mmol/L (ref 22–32)
Calcium: 10.6 mg/dL — ABNORMAL HIGH (ref 8.9–10.3)
Chloride: 112 mmol/L — ABNORMAL HIGH (ref 98–111)
Creatinine: 0.91 mg/dL (ref 0.44–1.00)
GFR, Estimated: >60 mL/min (ref >60)
Glucose, Bld: 94 mg/dL (ref 70–99)
Potassium: 3.6 mmol/L (ref 3.5–5.1)
Sodium: 143 mmol/L (ref 135–145)
Total Bilirubin: 1.4 mg/dL — ABNORMAL HIGH (ref 0.3–1.2)
Total Protein: 5.7 g/dL — ABNORMAL LOW (ref 6.5–8.1)

## 2022-10-16 LAB — CBC AND DIFFERENTIAL
HCT: 42 (ref 36–46)
Hemoglobin: 14.2 (ref 12.0–16.0)
Neutrophils Absolute: 1.6
Platelets: 86 10*3/uL — AB (ref 150–400)
WBC: 5

## 2022-10-16 LAB — FERRITIN: Ferritin: 46 ng/mL (ref 11–307)

## 2022-10-16 LAB — CBC: RBC: 4.47 (ref 3.87–5.11)

## 2022-10-16 NOTE — Telephone Encounter (Signed)
I am fine in her starting the doxycycline and will not interact with any of her cardiac issues.

## 2022-10-16 NOTE — Telephone Encounter (Signed)
Patient called on-call line stating that her GP has sent her a prescription for Doxycycline for GI tract, and wants to know if you approve of her starting this medication. Please advise  Just FYI: Patient also wanted to let you know that they found a large obstruction of food in her intestine and they have had to use Linzess, Miralax and an Enema and she still cannot get it to move through.

## 2022-10-16 NOTE — Telephone Encounter (Signed)
10/16/22 Spoke with patient and scheduled next appt 

## 2022-10-18 ENCOUNTER — Encounter: Payer: Self-pay | Admitting: Oncology

## 2022-10-18 ENCOUNTER — Ambulatory Visit (AMBULATORY_SURGERY_CENTER): Payer: PPO

## 2022-10-18 ENCOUNTER — Telehealth: Payer: Self-pay

## 2022-10-18 VITALS — Ht 68.0 in | Wt 300.0 lb

## 2022-10-18 DIAGNOSIS — Z86010 Personal history of colonic polyps: Secondary | ICD-10-CM

## 2022-10-18 MED ORDER — NA SULFATE-K SULFATE-MG SULF 17.5-3.13-1.6 GM/177ML PO SOLN
1.0000 | Freq: Once | ORAL | 0 refills | Status: AC
Start: 2022-10-18 — End: 2022-10-18

## 2022-10-18 NOTE — Progress Notes (Signed)
No egg or soy allergy known to patient  No issues known to pt with past sedation with any surgeries or procedures Patient denies ever being told they had issues or difficulty with intubation  No FH of Malignant Hyperthermia Pt is not on diet pills Pt is not on  home 02  Pt is not on blood thinners  Pt denies issues with constipation  No A fib or A flutter Have any cardiac testing pending--no Pt instructed to use Singlecare.com or GoodRx for a price reduction on prep  Patient's chart reviewed by Cathlyn Parsons CNRA prior to previsit and patient appropriate for the [facility].  Previsit completed and red dot placed by patient's name on their procedure day (on provider's schedule).   Can ambulate with walker

## 2022-10-19 ENCOUNTER — Other Ambulatory Visit: Payer: Self-pay

## 2022-10-19 DIAGNOSIS — Z86010 Personal history of colonic polyps: Secondary | ICD-10-CM

## 2022-10-19 NOTE — Telephone Encounter (Signed)
Pt scheduled for colonoscopy at [facility] 01/01/23 at 8:30am. [phone]

## 2022-10-19 NOTE — Telephone Encounter (Signed)
Called patient and discussed upcoming colonoscopy and message from [facility].  See Johns TE on moving patient to hospital due to difficult intubation. Cancelled [facility] colonoscopy.  Will reach out to Dr. Lamar Sprinkles nurse to schedule at the hospital.

## 2022-10-20 ENCOUNTER — Telehealth: Payer: Self-pay

## 2022-10-20 NOTE — Telephone Encounter (Signed)
Let patient know, she can try with her PCP also

## 2022-10-20 NOTE — Telephone Encounter (Signed)
Patients wegovy has been denied I appealed it and t was still denied

## 2022-10-20 NOTE — Telephone Encounter (Signed)
Patient aware I sent her mychart message

## 2022-10-20 NOTE — Telephone Encounter (Signed)
Called patient about new instructions for hospital colonoscopy.  Left message that the instructions have changed since the pre visit and I would be sending her updated instructions in the mail.

## 2022-10-25 DIAGNOSIS — M25561 Pain in right knee: Secondary | ICD-10-CM | POA: Diagnosis not present

## 2022-10-26 ENCOUNTER — Encounter: Payer: HMO | Admitting: Internal Medicine

## 2022-11-01 DIAGNOSIS — J40 Bronchitis, not specified as acute or chronic: Secondary | ICD-10-CM | POA: Diagnosis not present

## 2022-11-16 ENCOUNTER — Other Ambulatory Visit: Payer: Self-pay

## 2022-11-16 DIAGNOSIS — Z952 Presence of prosthetic heart valve: Secondary | ICD-10-CM

## 2022-11-16 DIAGNOSIS — R079 Chest pain, unspecified: Secondary | ICD-10-CM

## 2022-11-16 DIAGNOSIS — M25561 Pain in right knee: Secondary | ICD-10-CM | POA: Diagnosis not present

## 2022-11-20 ENCOUNTER — Other Ambulatory Visit: Payer: Self-pay | Admitting: Cardiothoracic Surgery

## 2022-11-20 DIAGNOSIS — M25561 Pain in right knee: Secondary | ICD-10-CM | POA: Diagnosis not present

## 2022-11-20 DIAGNOSIS — Z952 Presence of prosthetic heart valve: Secondary | ICD-10-CM

## 2022-11-20 NOTE — Progress Notes (Unsigned)
     [address]       [place] [address]             [phone]   HPI: This is a 67 year old female who had an aortic valve replacement (with a 25 mm Inspiris Edwards pericardial valve) by Dr. Donata Clay on 11/24/2021.  She contacted the office because a sternal wire may be protruding.  Current Outpatient Medications  Medication Sig Dispense Refill   acetaminophen (TYLENOL) 500 MG tablet Take 1,000 mg by mouth every 6 (six) hours as needed for moderate pain.     Ergocalciferol (VITAMIN D2 PO)      fludrocortisone (FLORINEF) 0.1 MG tablet Take 1 tablet (0.1 mg total) by mouth every evening. 30 tablet 6   levothyroxine (SYNTHROID) 150 MCG tablet Take 150 mcg by mouth See admin instructions. Take 175 mcg by mouth daily, except on Tuesday take 262.5 mcg     lidocaine (LIDODERM) 5 % Place 1 patch onto the skin daily. Remove & Discard patch within 12 hours or as directed by MD     linaclotide (LINZESS) 290 MCG CAPS capsule Take 290 mcg by mouth daily before breakfast.     nitroGLYCERIN (NITROSTAT) 0.4 MG SL tablet Place 1 tablet (0.4 mg total) under the tongue every 5 (five) minutes as needed for chest pain. 25 tablet 2   omeprazole (PRILOSEC) 40 MG capsule Take 40 mg by mouth daily.     polyethylene glycol (MIRALAX / GLYCOLAX) 17 g packet Take 17 g by mouth daily.     rosuvastatin (CRESTOR) 5 MG tablet TAKE ONE TABLET BY MOUTH DAILY 90 tablet 3   traMADol (ULTRAM) 50 MG tablet Take 1 tablet every 6 hours by oral route as needed for 5 days.    Vital Signs:   Physical Exam: CV- Pulmonary- Sternal wound-   Impression and Plan: ***    Ardelle Balls, PA-C [facility] [phone]

## 2022-11-21 ENCOUNTER — Ambulatory Visit: Payer: PPO | Admitting: Physician Assistant

## 2022-11-21 ENCOUNTER — Ambulatory Visit
Admission: RE | Admit: 2022-11-21 | Discharge: 2022-11-21 | Disposition: A | Discharge status: Home / Self Care | Payer: PPO | Source: Ambulatory Visit | Attending: Cardiothoracic Surgery | Admitting: Cardiothoracic Surgery

## 2022-11-21 VITALS — BP 110/73 | HR 96 | Resp 20 | Ht 68.0 in | Wt 293.0 lb

## 2022-11-21 DIAGNOSIS — Z952 Presence of prosthetic heart valve: Secondary | ICD-10-CM

## 2022-11-21 DIAGNOSIS — J9811 Atelectasis: Secondary | ICD-10-CM | POA: Diagnosis not present

## 2022-11-21 DIAGNOSIS — Z953 Presence of xenogenic heart valve: Secondary | ICD-10-CM

## 2022-11-21 NOTE — Progress Notes (Signed)
[address]       [place] [address]             [phone]      HPI: This is a 67 year old female who had an aortic valve replacement (with a 25 mm Inspiris Edwards pericardial valve) by Dr. Maren Beach on 11/24/2021. She presents to the office today because she feels a sternal wire may be protruding.   Today she reports that last weekend she went to lie down for the first time in a couple months after having to sleep in a chair due to bronchitis. When she lied down she noticed a shooting pain at the superior aspect of her old sternal incision and had to sit up immediately. She states she has not been able to lie flat since then. She admits to tenderness at the area but denies any redness, protrusion of a wire through the skin, purulent drainage, fever and chills. She denies shortness of breath and chest pain.  Current Outpatient Medications  Medication Sig Dispense Refill   acetaminophen (TYLENOL) 500 MG tablet Take 1,000 mg by mouth every 6 (six) hours as needed for moderate pain.     Ergocalciferol (VITAMIN D2 PO)      fludrocortisone (FLORINEF) 0.1 MG tablet Take 1 tablet (0.1 mg total) by mouth every evening. 30 tablet 6   levothyroxine (SYNTHROID) 150 MCG tablet Take 150 mcg by mouth See admin instructions. Take 175 mcg by mouth daily, except on Tuesday take 262.5 mcg     lidocaine (LIDODERM) 5 % Place 1 patch onto the skin daily. Remove & Discard patch within 12 hours or as directed by MD     linaclotide (LINZESS) 290 MCG CAPS capsule Take 290 mcg by mouth daily before breakfast.     nitroGLYCERIN (NITROSTAT) 0.4 MG SL tablet Place 1 tablet (0.4 mg total) under the tongue every 5 (five) minutes as needed for chest pain. 25 tablet 2   omeprazole (PRILOSEC) 40 MG capsule Take 40 mg by mouth daily.     polyethylene glycol (MIRALAX / GLYCOLAX) 17 g packet Take 17 g by mouth daily.     rosuvastatin (CRESTOR) 5 MG tablet TAKE ONE TABLET BY MOUTH DAILY 90 tablet 3    traMADol (ULTRAM) 50 MG tablet Take 1 tablet every 6 hours by oral route as needed for 5 days.     No current facility-administered medications for this visit.   Vitals: Today's Vitals   11/21/22 1415  BP: 110/73  Pulse: 96  Resp: 20  SpO2: 95%  Weight: 300 lb (136.1 kg)  Height: 5\' 8"  (1.727 m)   Body mass index is 45.61 kg/m.   Physical Exam: General: No acute distress Neuro: Grossly intact CV: Regular rate and rhythm, 2/6 systolic murmur Pulm: Clear to auscultation Wound: Sternal incision is well healed, no movement of the sternum. Can palpate the top two sternal wires. No protrusion through the skin, no erythema, no purulent drainage.   Diagnostic Tests: CLINICAL DATA:  Status post aortic valve replacement.   EXAM: CHEST - 2 VIEW   COMPARISON:  March 16, 2022.   FINDINGS: The heart size and mediastinal contours are within normal limits. Sternotomy wires are noted. Right lung is clear. Stable elevated left hemidiaphragm with mild left basilar subsegmental atelectasis. The visualized skeletal structures are unremarkable.   IMPRESSION: Stable elevated left hemidiaphragm with mild left basilar subsegmental atelectasis.     Electronically Signed   By: Fayrene Fearing  Christen Butter M.D.   On: 11/21/2022 14:47  Assessment/Plan: Ms. Labrador presents about 1 year s/p aortic valve replacement by Dr. Maren Beach. She notes pain when lying down flat and discomfort when participating in physical therapy at the superior portion of her sternal incision. The superior most two sternal wires are easily palpable and tender to the patient when palpated. There is no sign of infection. We will plan to take her to the OR for sternal wire removal when OR availability allows.    Jenny Reichmann, PA-C [facility] [phone]

## 2022-11-21 NOTE — H&P (View-Only) (Signed)
    [address]       [place] [address]             [phone]      HPI: This is a 67 year old female who had an aortic valve replacement (with a 25 mm Inspiris Edwards pericardial valve) by Dr. VanTrigt on 11/24/2021. She presents to the office today because she feels a sternal wire may be protruding.   Today she reports that last weekend she went to lie down for the first time in a couple months after having to sleep in a chair due to bronchitis. When she lied down she noticed a shooting pain at the superior aspect of her old sternal incision and had to sit up immediately. She states she has not been able to lie flat since then. She admits to tenderness at the area but denies any redness, protrusion of a wire through the skin, purulent drainage, fever and chills. She denies shortness of breath and chest pain.  Current Outpatient Medications  Medication Sig Dispense Refill   acetaminophen (TYLENOL) 500 MG tablet Take 1,000 mg by mouth every 6 (six) hours as needed for moderate pain.     Ergocalciferol (VITAMIN D2 PO)      fludrocortisone (FLORINEF) 0.1 MG tablet Take 1 tablet (0.1 mg total) by mouth every evening. 30 tablet 6   levothyroxine (SYNTHROID) 150 MCG tablet Take 150 mcg by mouth See admin instructions. Take 175 mcg by mouth daily, except on Tuesday take 262.5 mcg     lidocaine (LIDODERM) 5 % Place 1 patch onto the skin daily. Remove & Discard patch within 12 hours or as directed by MD     linaclotide (LINZESS) 290 MCG CAPS capsule Take 290 mcg by mouth daily before breakfast.     nitroGLYCERIN (NITROSTAT) 0.4 MG SL tablet Place 1 tablet (0.4 mg total) under the tongue every 5 (five) minutes as needed for chest pain. 25 tablet 2   omeprazole (PRILOSEC) 40 MG capsule Take 40 mg by mouth daily.     polyethylene glycol (MIRALAX / GLYCOLAX) 17 g packet Take 17 g by mouth daily.     rosuvastatin (CRESTOR) 5 MG tablet TAKE ONE TABLET BY MOUTH DAILY 90 tablet 3    traMADol (ULTRAM) 50 MG tablet Take 1 tablet every 6 hours by oral route as needed for 5 days.     No current facility-administered medications for this visit.   Vitals: Today's Vitals   11/21/22 1415  BP: 110/73  Pulse: 96  Resp: 20  SpO2: 95%  Weight: 300 lb (136.1 kg)  Height: 5' 8" (1.727 m)   Body mass index is 45.61 kg/m.   Physical Exam: General: No acute distress Neuro: Grossly intact CV: Regular rate and rhythm, 2/6 systolic murmur Pulm: Clear to auscultation Wound: Sternal incision is well healed, no movement of the sternum. Can palpate the top two sternal wires. No protrusion through the skin, no erythema, no purulent drainage.   Diagnostic Tests: CLINICAL DATA:  Status post aortic valve replacement.   EXAM: CHEST - 2 VIEW   COMPARISON:  March 16, 2022.   FINDINGS: The heart size and mediastinal contours are within normal limits. Sternotomy wires are noted. Right lung is clear. Stable elevated left hemidiaphragm with mild left basilar subsegmental atelectasis. The visualized skeletal structures are unremarkable.   IMPRESSION: Stable elevated left hemidiaphragm with mild left basilar subsegmental atelectasis.     Electronically Signed   By: James    Green Jr M.D.   On: 11/21/2022 14:47  Assessment/Plan: Ms. Derden presents about 1 year s/p aortic valve replacement by Dr. VanTrigt. She notes pain when lying down flat and discomfort when participating in physical therapy at the superior portion of her sternal incision. The superior most two sternal wires are easily palpable and tender to the patient when palpated. There is no sign of infection. We will plan to take her to the OR for sternal wire removal when OR availability allows.    Callaway Hailes C Opie Maclaughlin, PA-C [facility] [phone]  

## 2022-11-22 ENCOUNTER — Other Ambulatory Visit: Payer: Self-pay | Admitting: *Deleted

## 2022-11-22 DIAGNOSIS — R0789 Other chest pain: Secondary | ICD-10-CM

## 2022-11-22 DIAGNOSIS — M25561 Pain in right knee: Secondary | ICD-10-CM | POA: Diagnosis not present

## 2022-11-28 ENCOUNTER — Other Ambulatory Visit: Payer: Self-pay

## 2022-11-28 ENCOUNTER — Encounter (HOSPITAL_COMMUNITY): Payer: Self-pay | Admitting: Thoracic Surgery (Cardiothoracic Vascular Surgery)

## 2022-11-28 DIAGNOSIS — Z13820 Encounter for screening for osteoporosis: Secondary | ICD-10-CM | POA: Diagnosis not present

## 2022-11-28 DIAGNOSIS — M25561 Pain in right knee: Secondary | ICD-10-CM | POA: Diagnosis not present

## 2022-11-28 NOTE — Progress Notes (Signed)
Anesthesia Chart Review: Joanna Alvarado  Case: [id number] Date/Time: 11/29/22 1317   Procedure: STERNAL WIRES REMOVAL (Chest)   Anesthesia type: Monitor Anesthesia Care   Pre-op diagnosis: STERNAL PAIN   [facility] ROOM 14 / [facility]   Surgeons: Loreli Slot, MD       DISCUSSION: Patient is a 67 year old female scheduled for the above procedure. S/p AVR 11/24/21. Office visit on 11/21/22 for possible protrusion of sternal wires. BY notes, "She notes pain when lying down flat and discomfort when participating in physical therapy at the superior portion of her sternal incision. The superior most two sternal wires are easily palpable and tender to the patient when palpated. There is no sign of infection. We will plan to take her to the OR for sternal wire removal when OR availability allows."   History includes never smoker, aortic insufficiency (s/p AVR 25 mm Inspiris Edwards pericardial tissue valve 11/24/21), aortic root dilatation, ITP, HTN (now with orthostatic hypotension, on Florinef), DM2, OSA, anemia, hypothyroidism, fibromyalgia, GERD, hiatal hernia, morbid obesity, DIFFICULT INTUBATION (11/24/21: difficulty was unanticipated. No view with MAC #3, mast ventilation without difficulty; on 2nd attempt successful intubation using Glidescope and 3, 7.5 mm ETT).  Last visit with cardiologist Dr. Jacinto Halim on 09/21/22 for orthostatic hypotension. She had not been able to tolerate midodrine due to severe headaches. He suspected orthostasis was due to "pickwickian leg syndrome, related to decreased venous return from morbid obesity.  He advise she use midodrine on an as-needed basis along and would try trial of fludrocortisone and use of support stockings. He also encouraged weight loss. Observing aortic root dilatation--4.0 cm on 09/2022 echo. One year cardiology follow-up planned.   Per 10/16/2022 hematology follow-up with Dr. Melvyn Neth, her hemoglobin improved since IV iron given.  Her platelet count  was 86K which was stable.  She was doing well from a hematologic perspective with a 58-month follow-up planned.    Anesthesia team to evaluate on the day of surgery. As of 11/28/22, she reported that she has not yet started Ozempic.   VS:  BP Readings from Last 3 Encounters:  11/21/22 110/73  10/16/22 (!) 142/82  09/21/22 131/81   Pulse Readings from Last 3 Encounters:  11/21/22 96  10/16/22 (!) 102  09/21/22 (!) 111     PROVIDERS: Marylen Ponto, MD is PCP  Yates Decamp, MD is cardiologist Rennis Harding, MD is Angela Nevin, MD is endocrinologist   LABS: For day of surgery as indicated. Last results in [facility] include: Lab Results  Component Value Date   WBC 5.0 10/16/2022   HGB 14.2 10/16/2022   HCT 42 10/16/2022   PLT 86 (A) 10/16/2022   GLUCOSE 94 10/16/2022   ALT 20 10/16/2022   AST 41 10/16/2022   NA 143 10/16/2022   K 3.6 10/16/2022   CL 112 (H) 10/16/2022   CREATININE 0.91 10/16/2022   BUN 9 10/16/2022   CO2 24 10/16/2022   TSH 0.850 10/13/2021  A1c 5.0%, TSH 0.06, FT3 2.08, FT4 2.2 on 08/04/22 ([facility])--appears she had levothyroxine dose adjustment per Dr. Katrinka Blazing with follow-up scheduled 02/06/23.    IMAGES: CXR 11/21/2022: FINDINGS: The heart size and mediastinal contours are within normal limits. Sternotomy wires are noted. Right lung is clear. Stable elevated left hemidiaphragm with mild left basilar subsegmental atelectasis. The visualized skeletal structures are unremarkable. IMPRESSION: Stable elevated left hemidiaphragm with mild left basilar subsegmental atelectasis.    EKG: EKG 08/09/2022: Sinus tachycardia at the rate of  103 bpm, left axis deviation, left anterior fascicular block. IRBBB normal poor R progression, cannot exclude anteroseptal infarct old. LVH. Nonspecific ST-T abnormality. Compared to 07/12/2022, no significant change, PAC and PVC not present.    CV: Echocardiogram 09/13/2022:  Normal LV systolic function with visual EF 60-65%.  Left ventricle cavity is normal in size. Mild concentric hypertrophy of the left ventricle. Normal global wall motion. Normal diastolic filling pattern, normal LAP. Calculated EF 65%. Left atrial cavity is moderately dilated at 4.8 cm. Well seated bioprosthetic aortic valve (Inspiris resilia size 25mm, per op note). Leaflets not well visualized. No significant aortic regurgitation Structurally normal tricuspid valve.  Mild tricuspid regurgitation. No evidence of pulmonary hypertension. RVSP measures 31 mmHg. The aortic root is mildly dilated at 4.0 cm. No significant change compared to 09/2021.   Carotid artery duplex 11/22/2021: Summary:  Right Carotid: Velocities in the right ICA are consistent with a 1-39%  stenosis.  Left Carotid: Velocities in the left ICA are consistent with a 1-39%  stenosis.  Vertebrals: Bilateral vertebral arteries demonstrate antegrade flow.  Subclavians: Normal flow hemodynamics were seen in bilateral subclavian               arteries.    Right & Left Heart Catheterization 11/08/2021:  LV: 134/9, EDP 21 mmHg.  Ao 130/52, mean 97 mmHg.  Pulse pressure 82 mmHg.  Moderately elevated LVEDP. LM: Large vessel: Smooth and normal. LAD: Large caliber vessel.  Gives origin to a moderate-sized D1 which immediately bifurcates into secondary branch.  Ostium has 85% focal stenosis.  D2 is moderate size, mild disease.  Mid segment of the LAD after D2 has 10 to 20% stenosis. CX: Gives origin to a small high OM1 followed by a moderate to large sized OM 2 and small OM 3 and OM 4.  AV groove circumflex is small to moderate-sized vessel with the ostium 10 to 20% stenosis. RCA: Dominant.  Mildly ectatic in the proximal segment.  Mild disease of 10 to 20% in the midsegment.  Slow flow in the right coronary artery. Ascending aortogram: There is moderate dilatation of the ascending aorta.  Aortic regurgitation is evident, severity was not assessed as it was a hand contrast  injection.  Right heart: RA 12/9, mean 8 mmHg RV 32/3, EDP 16 mmHg PA 27/6, mean 16 mmHg PA saturation 80%. PW: Not obtained as I had difficulty in advancing the PA catheter to the wedge position due to tortuosity of the peripheral vessels. CO 11.19 and CI 4.57.  QP/QS 1.00.  Recommendation: Patient will be reassessed by Dr. Maren Beach for consideration of Bentall procedure.  D1 is small to moderate size, will discuss with him regarding revascularization versus medical therapy only.    Long term monitor 08/27/2019: - ZIO monitor was performed for 7 days and 2 hours beginning 08/27/2019 to evaluate palpitation. - The rhythm throughout was sinus with minimum average and maximum heart rates of 50, 73 and 157 bpm. - There were no pauses of 3 seconds or greater and there were no episodes of second or third-degree AV block or sinus node exit block. - There where 5 triggered and 4 diary events predominantly seen with sinus rhythm although at times there were atrial or ventricular premature contractions. - Ventricular ectopy was rare with PVCs and couplets.  There was one 7 beat run of PVCs present. - Supraventricular ectopy was rare there were no episodes of atrial fibrillation or flutter.  There was one brief atrial tachycardias seen 9 complexes at  a rate of 111 bpm -  Conclusion, rare ventricular and supraventricular ectopy however there is one brief run of PVCs and one short episode of atrial tachycardia.  The diary and triggered events were predominantly sinus rhythm.   Past Medical History:  Diagnosis Date   Abnormal myocardial perfusion study 09/26/2019   Anemia    Angina pectoris (HCC) - Class II-III 09/26/2019   Aortic valve insufficiency    Arthritis    KNEES   Benign neoplasm of ascending colon    Benign neoplasm of cecum    Benign neoplasm of descending colon    Benign neoplasm of sigmoid colon    Benign neoplasm of transverse colon    Bowel habit changes    Cataract     BILATERAL-REMOVED   Chronic postoperative pain 12/02/2012   Complication of anesthesia    Diabetes mellitus without complication (HCC)    Difficult intubation    Disturbance of skin sensation 12/02/2012   Fibromyalgia    GERD (gastroesophageal reflux disease)    Head injury    scalp injury x2 required sutures   Headache    prior to hyster   Heart murmur    History of blood transfusion    History of colonic polyps    History of hiatal hernia    History of kidney stones    History of transfusion of platelets 11/2021   Hypertension    Hypothyroidism    Idiopathic thrombocytopenic purpura (ITP) (HCC)    Myalgia and myositis, unspecified 12/02/2012   Personal history of colonic polyps    Pneumonia    Primary hypothyroidism 11/30/2015   Senile nuclear sclerosis 04/24/2014   Sleep apnea    uses c-pap   Thyroid disease    Thyroid nodule 11/30/2015   Type 2 diabetes mellitus without complications (HCC) 05/27/2018   11/17/22- A1C 4.9   Vitamin D deficiency 11/30/2015    Past Surgical History:  Procedure Laterality Date   ABDOMINAL HYSTERECTOMY     AORTIC VALVE REPLACEMENT N/A 11/24/2021   Procedure: AORTIC VALVE REPLACEMENT (AVR) USING INSPIRIS RESILIA  AORTIC VALVE;  Surgeon: Lovett Sox, MD;  Location: [facility] OR;  Service: Open Heart Surgery;  Laterality: N/A;   APPENDECTOMY     CHOLECYSTECTOMY     COLONOSCOPY N/A 07/27/2015   Procedure: COLONOSCOPY;  Surgeon: Hilarie Fredrickson, MD;  Location: [facility] ENDOSCOPY;  Service: Endoscopy;  Laterality: N/A;   COLONOSCOPY WITH PROPOFOL N/A 04/28/2019   Procedure: COLONOSCOPY WITH PROPOFOL;  Surgeon: Hilarie Fredrickson, MD;  Location: [facility] ENDOSCOPY;  Service: Endoscopy;  Laterality: N/A;   EYE SURGERY     bilateral cataracts with lens implants   floater bone surgery     bone removed from right hand   FOOT SURGERY     RIGHT   FOOT SURGERY Left    triple orthodesis   FRACTURE SURGERY     left foot surgery     removed tendon and placed pins  in foot   LEFT HEART CATH AND CORONARY ANGIOGRAPHY N/A 09/26/2019   Procedure: LEFT HEART CATH AND CORONARY ANGIOGRAPHY;  Surgeon: Marykay Lex, MD;  [facility] INVASIVE CV LAB;  Service: Cardiovascular;  Laterality: N/A;   PLANTAR FASCIA SURGERY     both feet   POLYPECTOMY  04/28/2019   Procedure: POLYPECTOMY;  Surgeon: Hilarie Fredrickson, MD;  Location: [facility] ENDOS[facility];  Service: Endoscopy;;   RIGHT HEART CATH N/A 02/15/2021   Procedure: RIGHT HEART CATH;  Surgeon: Elder Negus, MD;  Location: [facility] INVASIVE CV LAB;  Service: Cardiovascular;  Laterality: N/A;   RIGHT/LEFT HEART CATH AND CORONARY ANGIOGRAPHY N/A 11/08/2021   Procedure: RIGHT/LEFT HEART CATH AND CORONARY ANGIOGRAPHY;  Surgeon: Yates Decamp, MD;  Location: [facility] INVASIVE CV LAB;  Service: Cardiovascular;  Laterality: N/A;   TEE WITHOUT CARDIOVERSION N/A 11/24/2021   Procedure: TRANSESOPHAGEAL ECHOCARDIOGRAM (TEE);  Surgeon: Lovett Sox, MD;  Location: [facility] OR;  Service: Open Heart Surgery;  Laterality: N/A;   TONSILLECTOMY      MEDICATIONS: No current facility-administered medications for this encounter.    acetaminophen (TYLENOL) 500 MG tablet   dicyclomine (BENTYL) 10 MG capsule   Ergocalciferol (VITAMIN D2 PO)   fludrocortisone (FLORINEF) 0.1 MG tablet   levothyroxine (SYNTHROID) 150 MCG tablet   lidocaine (LIDODERM) 5 %   linaclotide (LINZESS) 290 MCG CAPS capsule   nitroGLYCERIN (NITROSTAT) 0.4 MG SL tablet   omeprazole (PRILOSEC) 40 MG capsule   OVER THE COUNTER MEDICATION   polyethylene glycol (MIRALAX / GLYCOLAX) 17 g packet   rosuvastatin (CRESTOR) 5 MG tablet   traMADol (ULTRAM) 50 MG tablet   Semaglutide,0.25 or 0.5MG /DOS, (OZEMPIC, 0.25 OR 0.5 MG/DOSE,) 2 MG/1.5ML SOPN    Shonna Chock, PA-C Surgical Short Stay/Anesthesiology [facility] Phone [phone] [facility] Phone [phone] 11/28/2022 12:50 PM

## 2022-11-28 NOTE — Progress Notes (Signed)
[name] denies chest pain or shortness of breath. Patient reported that she has not had chest pain or much shortness of breath since heart surgery 11/24/21. Mrs. Joanna Alvarado  denies having any s/s of Covid in her household, also denies any known exposure to Covid.  Patient reports that she had bronchitis in May, no symptoms since the last week of May. [name] has a stuffy nose most mornings when she wakes up, patient attributes this to allergies.  Mrs. Joanna Alvarado's PCP is Dr. Juleen China, endrocrinologist is Dr. Bayard Beaver, cardiologist is Dr. Jacinto Halim.  Mrs. Joanna Alvarado has been a type II diabetic, patient reports that she took Ozempic in the past, has not taken in, "over 2.5 years ago."  Patient states that she lost weight and blood sugar returned to normal. Dr. Jacinto Halim wants Mrs. Joanna Alvarado to start taking Ozempic again for weight loss.  Patient has not started Ozempic at this time. [name]'s last A1C was 4.9.  [name] does not check CBGs.  Mrs. Joanna Alvarado has a history of dizziness spells, with passing out." Adjustments have been made in blood pressure medications, spells continued, Dr. Jacinto Halim discontinued blood pressure medications and started patient on Florinef, which decreased the spells.  Yesterday, 11/27/22, [name] was in PT, the therapist was pushing her knee down, this caused excruciating pain, patient began to feel sick and asked to have blood pressure taken, it was 98/67, patient then sat  up, and blood pressure did not register.  Mrs. Joanna Alvarado began to feel better, she went home, checked her blood pressure at home, it was 106/57.  Patient took Florinef and felt better, she always takes Florinef in the afternoon.  I asked anesthesia PA-C to review.

## 2022-11-28 NOTE — Anesthesia Preprocedure Evaluation (Addendum)
Anesthesia Evaluation  Patient identified by MRN, date of birth, ID band Patient awake    Reviewed: Allergy & Precautions, NPO status , Patient's Chart, lab work & pertinent test results  History of Anesthesia Complications (+) DIFFICULT AIRWAY and history of anesthetic complications (easy VideoGlide in past)  Airway Mallampati: III   Neck ROM: Full  Mouth opening: Limited Mouth Opening  Dental  (+) Dental Advisory Given   Pulmonary sleep apnea (does not tolerate CPAP)    breath sounds clear to auscultation       Cardiovascular hypertension, + Valvular Problems/Murmurs (s/p AVR)  Rhythm:Regular Rate:Normal  09/13/2022 ECHO: EF 60-65%, normal LVF with mild LVH, S/P AVR with no AI, mild TR   Neuro/Psych  Headaches    GI/Hepatic Neg liver ROS, hiatal hernia,GERD  Controlled and Medicated,,  Endo/Other  diabetesHypothyroidism  Morbid obesityBMI 44.55  Renal/GU Renal InsufficiencyRenal disease     Musculoskeletal  (+) Arthritis ,  Fibromyalgia -  Abdominal   Peds  Hematology Plt 61k, Hb 13.2   Anesthesia Other Findings   Reproductive/Obstetrics                             Anesthesia Physical Anesthesia Plan  ASA: 3  Anesthesia Plan: MAC   Post-op Pain Management: Tylenol PO (pre-op)*   Induction:   PONV Risk Score and Plan: 2 and Ondansetron and Dexamethasone  Airway Management Planned: Natural Airway and Simple Face Mask  Additional Equipment: None  Intra-op Plan:   Post-operative Plan:   Informed Consent: I have reviewed the patients History and Physical, chart, labs and discussed the procedure including the risks, benefits and alternatives for the proposed anesthesia with the patient or authorized representative who has indicated his/her understanding and acceptance.     Dental advisory given  Plan Discussed with: CRNA and Surgeon  Anesthesia Plan Comments: (PAT note written  11/28/2022 by Shonna Chock, PA-C.   DIFFICULT INTUBATION 11/24/21: difficulty was unanticipated. No view with MAC #3, mast ventilation without difficulty; on 2nd attempt successful intubation using Glidescope and 3, 7.5 mm ETT.)       Anesthesia Quick Evaluation

## 2022-11-29 ENCOUNTER — Other Ambulatory Visit: Payer: Self-pay

## 2022-11-29 ENCOUNTER — Ambulatory Visit (HOSPITAL_COMMUNITY): Payer: PPO | Admitting: Vascular Surgery

## 2022-11-29 ENCOUNTER — Encounter (HOSPITAL_COMMUNITY)
Admission: RE | Disposition: A | Discharge status: Home / Self Care | Payer: Self-pay | Source: Home / Self Care | Attending: Thoracic Surgery (Cardiothoracic Vascular Surgery)

## 2022-11-29 ENCOUNTER — Ambulatory Visit (HOSPITAL_BASED_OUTPATIENT_CLINIC_OR_DEPARTMENT_OTHER): Payer: PPO | Admitting: Vascular Surgery

## 2022-11-29 ENCOUNTER — Ambulatory Visit (HOSPITAL_COMMUNITY)
Admission: RE | Admit: 2022-11-29 | Discharge: 2022-11-29 | Disposition: A | Discharge status: Home / Self Care | Payer: PPO | Attending: Thoracic Surgery (Cardiothoracic Vascular Surgery) | Admitting: Thoracic Surgery (Cardiothoracic Vascular Surgery)

## 2022-11-29 ENCOUNTER — Encounter (HOSPITAL_COMMUNITY): Payer: Self-pay | Admitting: Thoracic Surgery (Cardiothoracic Vascular Surgery)

## 2022-11-29 DIAGNOSIS — E119 Type 2 diabetes mellitus without complications: Secondary | ICD-10-CM | POA: Insufficient documentation

## 2022-11-29 DIAGNOSIS — K449 Diaphragmatic hernia without obstruction or gangrene: Secondary | ICD-10-CM | POA: Diagnosis not present

## 2022-11-29 DIAGNOSIS — R519 Headache, unspecified: Secondary | ICD-10-CM | POA: Diagnosis not present

## 2022-11-29 DIAGNOSIS — M1990 Unspecified osteoarthritis, unspecified site: Secondary | ICD-10-CM | POA: Insufficient documentation

## 2022-11-29 DIAGNOSIS — Z472 Encounter for removal of internal fixation device: Secondary | ICD-10-CM | POA: Diagnosis not present

## 2022-11-29 DIAGNOSIS — R072 Precordial pain: Secondary | ICD-10-CM | POA: Diagnosis not present

## 2022-11-29 DIAGNOSIS — Z86010 Personal history of colonic polyps: Secondary | ICD-10-CM | POA: Diagnosis not present

## 2022-11-29 DIAGNOSIS — I129 Hypertensive chronic kidney disease with stage 1 through stage 4 chronic kidney disease, or unspecified chronic kidney disease: Secondary | ICD-10-CM | POA: Diagnosis not present

## 2022-11-29 DIAGNOSIS — X58XXXA Exposure to other specified factors, initial encounter: Secondary | ICD-10-CM | POA: Diagnosis not present

## 2022-11-29 DIAGNOSIS — M797 Fibromyalgia: Secondary | ICD-10-CM | POA: Insufficient documentation

## 2022-11-29 DIAGNOSIS — I10 Essential (primary) hypertension: Secondary | ICD-10-CM | POA: Insufficient documentation

## 2022-11-29 DIAGNOSIS — E1122 Type 2 diabetes mellitus with diabetic chronic kidney disease: Secondary | ICD-10-CM | POA: Diagnosis not present

## 2022-11-29 DIAGNOSIS — I444 Left anterior fascicular block: Secondary | ICD-10-CM | POA: Diagnosis not present

## 2022-11-29 DIAGNOSIS — R0789 Other chest pain: Secondary | ICD-10-CM | POA: Insufficient documentation

## 2022-11-29 DIAGNOSIS — T84218A Breakdown (mechanical) of internal fixation device of other bones, initial encounter: Secondary | ICD-10-CM | POA: Diagnosis not present

## 2022-11-29 DIAGNOSIS — G4730 Sleep apnea, unspecified: Secondary | ICD-10-CM | POA: Insufficient documentation

## 2022-11-29 DIAGNOSIS — K219 Gastro-esophageal reflux disease without esophagitis: Secondary | ICD-10-CM | POA: Insufficient documentation

## 2022-11-29 DIAGNOSIS — T8484XA Pain due to internal orthopedic prosthetic devices, implants and grafts, initial encounter: Secondary | ICD-10-CM

## 2022-11-29 DIAGNOSIS — Z6841 Body Mass Index (BMI) 40.0 and over, adult: Secondary | ICD-10-CM | POA: Diagnosis not present

## 2022-11-29 DIAGNOSIS — N183 Chronic kidney disease, stage 3 unspecified: Secondary | ICD-10-CM | POA: Diagnosis not present

## 2022-11-29 DIAGNOSIS — Z953 Presence of xenogenic heart valve: Secondary | ICD-10-CM | POA: Insufficient documentation

## 2022-11-29 DIAGNOSIS — E039 Hypothyroidism, unspecified: Secondary | ICD-10-CM

## 2022-11-29 DIAGNOSIS — I082 Rheumatic disorders of both aortic and tricuspid valves: Secondary | ICD-10-CM | POA: Diagnosis not present

## 2022-11-29 DIAGNOSIS — I2510 Atherosclerotic heart disease of native coronary artery without angina pectoris: Secondary | ICD-10-CM | POA: Diagnosis not present

## 2022-11-29 DIAGNOSIS — T82847A Pain from cardiac prosthetic devices, implants and grafts, initial encounter: Secondary | ICD-10-CM | POA: Insufficient documentation

## 2022-11-29 DIAGNOSIS — I491 Atrial premature depolarization: Secondary | ICD-10-CM | POA: Insufficient documentation

## 2022-11-29 HISTORY — PX: STERNAL WIRES REMOVAL: SHX2441

## 2022-11-29 HISTORY — DX: Unspecified injury of head, initial encounter: S09.90XA

## 2022-11-29 HISTORY — DX: Headache, unspecified: R51.9

## 2022-11-29 HISTORY — DX: Personal history of other diseases of the digestive system: Z87.19

## 2022-11-29 HISTORY — DX: Cardiac murmur, unspecified: R01.1

## 2022-11-29 HISTORY — DX: Other complications of anesthesia, initial encounter: T88.59XA

## 2022-11-29 HISTORY — DX: Failed or difficult intubation, initial encounter: T88.4XXA

## 2022-11-29 HISTORY — DX: Personal history of other medical treatment: Z92.89

## 2022-11-29 LAB — COMPREHENSIVE METABOLIC PANEL
ALT: 29 U/L (ref 0–44)
AST: 48 U/L — ABNORMAL HIGH (ref 15–41)
Albumin: 2.9 g/dL — ABNORMAL LOW (ref 3.5–5.0)
Alkaline Phosphatase: 128 U/L — ABNORMAL HIGH (ref 38–126)
Anion gap: 8 (ref 5–15)
BUN: 10 mg/dL (ref 8–23)
CO2: 20 mmol/L — ABNORMAL LOW (ref 22–32)
Calcium: 9.5 mg/dL (ref 8.9–10.3)
Chloride: 111 mmol/L (ref 98–111)
Creatinine, Ser: 0.92 mg/dL (ref 0.44–1.00)
GFR, Estimated: >60 mL/min (ref >60)
Glucose, Bld: 97 mg/dL (ref 70–99)
Potassium: 3.2 mmol/L — ABNORMAL LOW (ref 3.5–5.1)
Sodium: 139 mmol/L (ref 135–145)
Total Bilirubin: 1.4 mg/dL — ABNORMAL HIGH (ref 0.3–1.2)
Total Protein: 4.9 g/dL — ABNORMAL LOW (ref 6.5–8.1)

## 2022-11-29 LAB — CBC
HCT: 39.3 % (ref 36.0–46.0)
Hemoglobin: 13.2 g/dL (ref 12.0–15.0)
MCH: 30.8 pg (ref 26.0–34.0)
MCHC: 33.6 g/dL (ref 30.0–36.0)
MCV: 91.6 fL (ref 80.0–100.0)
Platelets: 61 10*3/uL — ABNORMAL LOW (ref 150–400)
RBC: 4.29 MIL/uL (ref 3.87–5.11)
RDW: 13.9 % (ref 11.5–15.5)
WBC: 3.8 10*3/uL — ABNORMAL LOW (ref 4.0–10.5)
nRBC: 0 % (ref 0.0–0.2)

## 2022-11-29 LAB — GLUCOSE, CAPILLARY
Glucose-Capillary: 104 mg/dL — ABNORMAL HIGH (ref 70–99)
Glucose-Capillary: 76 mg/dL (ref 70–99)

## 2022-11-29 LAB — PROTIME-INR
INR: 1.4 — ABNORMAL HIGH (ref 0.8–1.2)
Prothrombin Time: 16.9 seconds — ABNORMAL HIGH (ref 11.4–15.2)

## 2022-11-29 LAB — SURGICAL PCR SCREEN
MRSA, PCR: NEGATIVE
Staphylococcus aureus: NEGATIVE

## 2022-11-29 LAB — APTT: aPTT: 32 seconds (ref 24–36)

## 2022-11-29 SURGERY — REMOVAL, STERNAL WIRE
Anesthesia: Monitor Anesthesia Care | Site: Chest

## 2022-11-29 MED ORDER — ORAL CARE MOUTH RINSE: 15.0000 mL | Freq: Once | OROMUCOSAL | Status: AC

## 2022-11-29 MED ORDER — CHLORHEXIDINE GLUCONATE 0.12 % MT SOLN
15.0000 mL | Freq: Once | OROMUCOSAL | Status: AC
  Administered 2022-11-29: 15 mL via OROMUCOSAL
  Filled 2022-11-29: qty 15

## 2022-11-29 MED ORDER — CEFAZOLIN IN SODIUM CHLORIDE 3-0.9 GM/100ML-% IV SOLN
3.0000 g | INTRAVENOUS | Status: AC
  Administered 2022-11-29: 3 g via INTRAVENOUS
  Filled 2022-11-29: qty 100

## 2022-11-29 MED ORDER — FENTANYL CITRATE (PF) 250 MCG/5ML IJ SOLN
INTRAMUSCULAR | Status: AC
  Filled 2022-11-29: qty 5

## 2022-11-29 MED ORDER — MEPERIDINE HCL 25 MG/ML IJ SOLN: 6.2500 mg | INTRAMUSCULAR | Status: DC | PRN

## 2022-11-29 MED ORDER — TRAMADOL HCL 50 MG PO TABS: 50.0000 mg | ORAL_TABLET | Freq: Four times a day (QID) | ORAL | Status: DC | PRN

## 2022-11-29 MED ORDER — MIDAZOLAM HCL 2 MG/2ML IJ SOLN
INTRAMUSCULAR | Status: DC | PRN
  Administered 2022-11-29 (×2): 1 mg via INTRAVENOUS

## 2022-11-29 MED ORDER — TRAMADOL HCL 50 MG PO TABS
ORAL_TABLET | ORAL | Status: AC
  Administered 2022-11-29: 50 mg via ORAL
  Filled 2022-11-29: qty 1

## 2022-11-29 MED ORDER — FENTANYL CITRATE (PF) 250 MCG/5ML IJ SOLN
INTRAMUSCULAR | Status: DC | PRN
  Administered 2022-11-29: 50 ug via INTRAVENOUS
  Administered 2022-11-29: 25 ug via INTRAVENOUS
  Administered 2022-11-29 (×2): 50 ug via INTRAVENOUS
  Administered 2022-11-29: 25 ug via INTRAVENOUS

## 2022-11-29 MED ORDER — ACETAMINOPHEN 500 MG PO TABS
1000.0000 mg | ORAL_TABLET | Freq: Once | ORAL | Status: AC
  Administered 2022-11-29: 1000 mg via ORAL
  Filled 2022-11-29: qty 2

## 2022-11-29 MED ORDER — 0.9 % SODIUM CHLORIDE (POUR BTL) OPTIME
TOPICAL | Status: DC | PRN
  Administered 2022-11-29: 1000 mL

## 2022-11-29 MED ORDER — PROMETHAZINE HCL 25 MG/ML IJ SOLN: 6.2500 mg | INTRAMUSCULAR | Status: DC | PRN

## 2022-11-29 MED ORDER — MIDAZOLAM HCL 2 MG/2ML IJ SOLN: 0.5000 mg | Freq: Once | INTRAMUSCULAR | Status: DC | PRN

## 2022-11-29 MED ORDER — FENTANYL CITRATE (PF) 100 MCG/2ML IJ SOLN: 25.0000 ug | INTRAMUSCULAR | Status: DC | PRN

## 2022-11-29 MED ORDER — LACTATED RINGERS IV SOLN: INTRAVENOUS | Status: DC

## 2022-11-29 MED ORDER — LIDOCAINE 2% (20 MG/ML) 5 ML SYRINGE
INTRAMUSCULAR | Status: DC | PRN
  Administered 2022-11-29: 50 mg via INTRAVENOUS

## 2022-11-29 MED ORDER — LIDOCAINE HCL (PF) 1 % IJ SOLN
INTRAMUSCULAR | Status: AC
  Filled 2022-11-29: qty 30

## 2022-11-29 MED ORDER — LIDOCAINE 2% (20 MG/ML) 5 ML SYRINGE
INTRAMUSCULAR | Status: AC
  Filled 2022-11-29: qty 5

## 2022-11-29 MED ORDER — ONDANSETRON HCL 4 MG/2ML IJ SOLN
INTRAMUSCULAR | Status: AC
  Filled 2022-11-29: qty 2

## 2022-11-29 MED ORDER — MIDAZOLAM HCL 2 MG/2ML IJ SOLN
INTRAMUSCULAR | Status: AC
  Filled 2022-11-29: qty 2

## 2022-11-29 MED ORDER — LIDOCAINE HCL 1 % IJ SOLN
INTRAMUSCULAR | Status: DC | PRN
  Administered 2022-11-29: 17 mL via INTRADERMAL

## 2022-11-29 MED ORDER — PROPOFOL 500 MG/50ML IV EMUL
INTRAVENOUS | Status: DC | PRN
  Administered 2022-11-29: 50 ug/kg/min via INTRAVENOUS

## 2022-11-29 MED ORDER — TRAMADOL HCL 50 MG PO TABS: 50.0000 mg | ORAL_TABLET | Freq: Once | ORAL | Status: AC

## 2022-11-29 MED ORDER — TRAMADOL HCL 50 MG PO TABS: 50.0000 mg | ORAL_TABLET | Freq: Four times a day (QID) | ORAL | 0 refills | Status: DC | PRN

## 2022-11-29 MED ORDER — ONDANSETRON HCL 4 MG/2ML IJ SOLN
INTRAMUSCULAR | Status: DC | PRN
  Administered 2022-11-29: 4 mg via INTRAVENOUS

## 2022-11-29 MED ORDER — CEFAZOLIN IN SODIUM CHLORIDE 3-0.9 GM/100ML-% IV SOLN
INTRAVENOUS | Status: AC
  Filled 2022-11-29: qty 100

## 2022-11-29 MED ORDER — PROPOFOL 1000 MG/100ML IV EMUL
INTRAVENOUS | Status: AC
  Filled 2022-11-29: qty 100

## 2022-11-29 MED ORDER — PROPOFOL 10 MG/ML IV BOLUS
INTRAVENOUS | Status: DC | PRN
  Administered 2022-11-29 (×2): 10 mg via INTRAVENOUS
  Administered 2022-11-29: 25 mg via INTRAVENOUS
  Administered 2022-11-29: 15 mg via INTRAVENOUS
  Administered 2022-11-29: 10 mg via INTRAVENOUS

## 2022-11-29 SURGICAL SUPPLY — 61 items
ADH SKN CLS APL DERMABOND .7 (GAUZE/BANDAGES/DRESSINGS) ×1
BLADE CLIPPER SURG (BLADE) ×2 IMPLANT
BLADE SURG 10 STRL SS (BLADE) IMPLANT
BLADE SURG 15 STRL LF DISP TIS (BLADE) IMPLANT
BLADE SURG 15 STRL SS (BLADE) ×1
CANISTER SUCT 3000ML PPV (MISCELLANEOUS) ×2 IMPLANT
CLIP TI MEDIUM 6 (CLIP) ×2 IMPLANT
CNTNR URN SCR LID CUP LEK RST (MISCELLANEOUS) IMPLANT
CONT SPEC 4OZ STRL OR WHT (MISCELLANEOUS)
COVER MAYO STAND STRL (DRAPES) IMPLANT
COVER SURGICAL LIGHT HANDLE (MISCELLANEOUS) IMPLANT
DERMABOND ADVANCED .7 DNX12 (GAUZE/BANDAGES/DRESSINGS) IMPLANT
DRAPE INCISE 23X17 STRL (DRAPES) IMPLANT
DRAPE INCISE IOBAN 23X17 STRL (DRAPES) ×1 IMPLANT
DRAPE INCISE IOBAN 66X45 STRL (DRAPES) IMPLANT
DRAPE LAPAROSCOPIC ABDOMINAL (DRAPES) ×2 IMPLANT
DRAPE WARM FLUID 44X44 (DRAPES) IMPLANT
ELECT REM PT RETURN 9FT ADLT (ELECTROSURGICAL) ×1
ELECTRODE REM PT RTRN 9FT ADLT (ELECTROSURGICAL) ×2 IMPLANT
GAUZE 4X4 16PLY ~~LOC~~+RFID DBL (SPONGE) ×2 IMPLANT
GAUZE SPONGE 4X4 12PLY STRL (GAUZE/BANDAGES/DRESSINGS) ×2 IMPLANT
GLOVE SS BIOGEL STRL SZ 7.5 (GLOVE) ×2 IMPLANT
GLOVE SURG SIGNA 7.5 PF LTX (GLOVE) ×2 IMPLANT
GOWN STRL REUS W/ TWL LRG LVL3 (GOWN DISPOSABLE) ×2 IMPLANT
GOWN STRL REUS W/ TWL XL LVL3 (GOWN DISPOSABLE) ×2 IMPLANT
GOWN STRL REUS W/TWL LRG LVL3 (GOWN DISPOSABLE) ×1
GOWN STRL REUS W/TWL XL LVL3 (GOWN DISPOSABLE) ×1
HEMOSTAT POWDER SURGIFOAM 1G (HEMOSTASIS) IMPLANT
KIT BASIN OR (CUSTOM PROCEDURE TRAY) ×2 IMPLANT
KIT TURNOVER KIT B (KITS) ×2 IMPLANT
MAT PREVALON FULL STRYKER (MISCELLANEOUS) IMPLANT
NDL 25GX 5/8IN NON SAFETY (NEEDLE) IMPLANT
NDL HYPO 25GX1X1/2 BEV (NEEDLE) IMPLANT
NEEDLE 25GX 5/8IN NON SAFETY (NEEDLE) IMPLANT
NEEDLE HYPO 25GX1X1/2 BEV (NEEDLE) ×1 IMPLANT
NS IRRIG 1000ML POUR BTL (IV SOLUTION) ×2 IMPLANT
PACK CHEST (CUSTOM PROCEDURE TRAY) ×2 IMPLANT
PAD ARMBOARD 7.5X6 YLW CONV (MISCELLANEOUS) ×4 IMPLANT
PENCIL BUTTON HOLSTER BLD 10FT (ELECTRODE) IMPLANT
SPONGE T-LAP 18X18 ~~LOC~~+RFID (SPONGE) ×8 IMPLANT
SPONGE T-LAP 4X18 ~~LOC~~+RFID (SPONGE) ×2 IMPLANT
SUT STEEL 6MS V (SUTURE) IMPLANT
SUT STEEL STERNAL CCS#1 18IN (SUTURE) IMPLANT
SUT STEEL SZ 6 DBL 3X14 BALL (SUTURE) IMPLANT
SUT VIC AB 1 CTX 36 (SUTURE) ×1
SUT VIC AB 1 CTX36XBRD ANBCTR (SUTURE) IMPLANT
SUT VIC AB 2-0 CTX 27 (SUTURE) IMPLANT
SUT VIC AB 2-0 SH 27 (SUTURE)
SUT VIC AB 2-0 SH 27XBRD (SUTURE) ×2 IMPLANT
SUT VIC AB 3-0 SH 27 (SUTURE) ×1
SUT VIC AB 3-0 SH 27X BRD (SUTURE) IMPLANT
SUT VIC AB 3-0 X1 27 (SUTURE) ×2 IMPLANT
SWAB COLLECTION DEVICE MRSA (MISCELLANEOUS) IMPLANT
SWAB CULTURE ESWAB REG 1ML (MISCELLANEOUS) IMPLANT
SYR BULB IRRIG 60ML STRL (SYRINGE) IMPLANT
SYR CONTROL 10ML LL (SYRINGE) IMPLANT
TOWEL GREEN STERILE (TOWEL DISPOSABLE) ×2 IMPLANT
TOWEL GREEN STERILE FF (TOWEL DISPOSABLE) ×2 IMPLANT
TRAY FOLEY MTR SLVR 14FR STAT (SET/KITS/TRAYS/PACK) IMPLANT
TUBE CONNECTING 12X1/4 (SUCTIONS) IMPLANT
WATER STERILE IRR 1000ML POUR (IV SOLUTION) ×2 IMPLANT

## 2022-11-29 NOTE — Discharge Instructions (Signed)
Do not drive or engage in heavy physical activity for 24 hours.  You may resume normal activities tomorrow.  Do not go in swimming pool or hot tub for 3 weeks  There is a medical adhesive over the incision. It will peel off in about 2 weeks.  You may shower tomorrow.  Apply ice pack to incision for 20 minutes 4 times a day for 2 days then as needed after that.  You have a prescription for tramadol, a narcotic pain reliever. You may use as directed.  You may use acetaminophen (Tylenol) and or ibuprofen (Advil, Motrin) in addition to, or instead of the tramadol.  Call [phone] if you develop fever > 101 F, excessive pain, swelling, redness or drainage from the incision.  My office will contact you with a follow up appointment.

## 2022-11-29 NOTE — Anesthesia Postprocedure Evaluation (Signed)
Anesthesia Post Note  Patient: Joanna Alvarado  Procedure(s) Performed: STERNAL WIRES REMOVAL (Chest)     Patient location during evaluation: PACU Anesthesia Type: MAC Level of consciousness: awake and alert, patient cooperative and oriented Pain management: pain level controlled Vital Signs Assessment: post-procedure vital signs reviewed and stable Respiratory status: nonlabored ventilation, spontaneous breathing and respiratory function stable Cardiovascular status: blood pressure returned to baseline, stable and bradycardic Postop Assessment: adequate PO intake and no apparent nausea or vomiting Anesthetic complications: no   No notable events documented.  Last Vitals:  Vitals:   11/29/22 1108 11/29/22 1508  BP: 111/68   Pulse: 91   Resp: 14   Temp: 36.8 C (!) 36.1 C  SpO2: 96%     Last Pain:  Vitals:   11/29/22 1108  TempSrc: Oral  PainSc: 7                  [name],[name]

## 2022-11-29 NOTE — Brief Op Note (Signed)
11/29/2022  3:14 PM  PATIENT:  Joanna Alvarado  67 y.o. female  PRE-OPERATIVE DIAGNOSIS:  STERNAL PAIN  POST-OPERATIVE DIAGNOSIS:  STERNAL PAIN  PROCEDURE:  Procedure(s): STERNAL WIRES REMOVAL (N/A) x 3  SURGEON:  Surgeon(s) and Role:    * Loreli Slot, MD - Primary  PHYSICIAN ASSISTANT:   ASSISTANTS: none   ANESTHESIA:   local and IV sedation  EBL:  20 mL   BLOOD ADMINISTERED:none  DRAINS: none   LOCAL MEDICATIONS USED:  LIDOCAINE  and Amount: 17 ml  SPECIMEN:  Source of Specimen:  wires  DISPOSITION OF SPECIMEN:  N/A  COUNTS:  YES  TOURNIQUET:  * No tourniquets in log *  DICTATION: .Other Dictation: Dictation Number -  PLAN OF CARE: Discharge to home after PACU  PATIENT DISPOSITION:  PACU - hemodynamically stable.   Delay start of Pharmacological VTE agent (>24hrs) due to surgical blood loss or risk of bleeding: not applicable

## 2022-11-29 NOTE — Op Note (Signed)
NAME: DASH, GALLY MEDICAL RECORD NO: [id number] ACCOUNT NO: [id number] DATE OF BIRTH: [birth year] FACILITY: [facility] LOCATION: [facility] PHYSICIAN: Salvatore Decent. Dorris Fetch, MD  Operative Report   DATE OF PROCEDURE: 11/29/2022  PREOPERATIVE DIAGNOSIS:  Sternal pain.  POSTOPERATIVE DIAGNOSIS:  Sternal pain.  PROCEDURE:  Sternal wire removal x3.  SURGEON:  Salvatore Decent. Dorris Fetch, MD  ASSISTANT:  None.  ANESTHESIA:  Local with intravenous sedation.  FINDINGS: Upper 3 sternal wires were removed intact.  CLINICAL NOTE: Mrs. Duvernay is a 67 year old woman with a history of an aortic valve replacement about a year ago.  She presented back to the office with pain due to sternal wires.  It was bothering her significantly and she was having to sleep sitting up.  She was offered the option of a sternal wire removal.  Indications, risks, benefits, and alternatives were discussed in detail with the patient.  She wished to proceed.  DESCRIPTION OF PROCEDURE: Mrs. Insko was brought to the operating room on 11/29/2022.  She was given intravenous antibiotics.  She was given intravenous sedation and monitored by the anesthesia service.  The neck and chest were prepped and draped in the usual sterile fashion.  A timeout was performed.  Local anesthesia then was achieved with 1% lidocaine.it was injected into the skin and subcutaneous tissues and the areas around the wire.  She did have some discomfort with injection of the local. After ensuring adequate local anesthetic effect, an incision was made overlying the manubrium.  This incision was made through the patient's previous scar. The subcutaneous tissue was dissected down to the uppermost sternal wire, which was mobilized, cut and removed.  The second manubrial wire likewise was exposed, cut and removed.  There was a palpable wire at the sternomanubrial junction, which was accessible from the operative field.  It was dissected out.  Additional local was  injected around that site.  This was a double wire, and it was also cut and removed.  The wound was irrigated with saline.  There was good hemostasis.  Wound was closed in 3 layers using absorbable sutures.  Dermabond was applied.  The patient was taken from the operating room to the postanesthetic care unit in good condition.  All sponge, needle and instrument counts were correct at the end of the procedure.   PUS D: 11/29/2022 4:00:50 pm T: 11/29/2022 4:39:00 pm  JOB: [id number]/ [id number]

## 2022-11-29 NOTE — Anesthesia Procedure Notes (Signed)
Procedure Name: MAC Date/Time: 11/29/2022 2:28 PM  Performed by: Drema Pry, CRNAPre-anesthesia Checklist: Patient identified, Emergency Drugs available, Suction available and Patient being monitored Patient Re-evaluated:Patient Re-evaluated prior to induction Oxygen Delivery Method: Simple face mask

## 2022-11-29 NOTE — Interval H&P Note (Signed)
History and Physical Interval Note: For removal of top 2 sternal wires today.  She is aware of the indications, risks, benefits and alternatives.  11/29/2022 1:18 PM  Joanna Alvarado  has presented today for surgery, with the diagnosis of STERNAL PAIN.  The various methods of treatment have been discussed with the patient and family. After consideration of risks, benefits and other options for treatment, the patient has consented to  Procedure(s): STERNAL WIRES REMOVAL (N/A) as a surgical intervention.  The patient's history has been reviewed, patient examined, no change in status, stable for surgery.  I have reviewed the patient's chart and labs.  Questions were answered to the patient's satisfaction.     Loreli Slot

## 2022-11-29 NOTE — Transfer of Care (Signed)
Immediate Anesthesia Transfer of Care Note  Patient: Joanna Alvarado  Procedure(s) Performed: STERNAL WIRES REMOVAL (Chest)  Patient Location: PACU  Anesthesia Type:MAC  Level of Consciousness: awake, alert , and patient cooperative  Airway & Oxygen Therapy: Patient Spontanous Breathing and Patient connected to face mask oxygen  Post-op Assessment: Report given to RN and Post -op Vital signs reviewed and stable  Post vital signs: Reviewed and stable  Last Vitals:  Vitals Value Taken Time  BP 120/63 11/29/22 1507  Temp    Pulse 82 11/29/22 1512  Resp 14 11/29/22 1512  SpO2 100 % 11/29/22 1512  Vitals shown include unvalidated device data.  Last Pain:  Vitals:   11/29/22 1108  TempSrc: Oral  PainSc: 7       Patients Stated Pain Goal: 3 (11/29/22 1108)  Complications: No notable events documented.

## 2022-11-30 ENCOUNTER — Encounter (HOSPITAL_COMMUNITY): Payer: Self-pay | Admitting: Thoracic Surgery (Cardiothoracic Vascular Surgery)

## 2022-12-01 ENCOUNTER — Telehealth: Payer: Self-pay | Admitting: Thoracic Surgery (Cardiothoracic Vascular Surgery)

## 2022-12-01 MED ORDER — CEPHALEXIN 500 MG PO CAPS
500.0000 mg | ORAL_CAPSULE | Freq: Three times a day (TID) | ORAL | 0 refills | Status: DC
Start: 2022-12-01 — End: 2023-02-06

## 2022-12-01 NOTE — Telephone Encounter (Signed)
[name] called to report incision feels hot to touch, some redness around incision but not where it feels hot.  No drainage.  Will order Keflex  Salvatore Decent. Dorris Fetch, MD [facility] [phone]

## 2022-12-11 NOTE — Progress Notes (Unsigned)
      [address]       [place] [address]             [phone]    HPI: Patient returns for routine postoperative follow-up having undergone Sternal Wire removal on 11/29/2022. The patient presents for post operative follow up/wound check.  She contacted our office concerned her incision was warm to touch and red.  She denied drainage, but was giving Keflex for treatment.   Current Outpatient Medications  Medication Sig Dispense Refill   acetaminophen (TYLENOL) 500 MG tablet Take 1,000 mg by mouth every 6 (six) hours as needed for moderate pain.     cephALEXin (KEFLEX) 500 MG capsule Take 1 capsule (500 mg total) by mouth 3 (three) times daily. 21 capsule 0   dicyclomine (BENTYL) 10 MG capsule Take 10 mg by mouth daily as needed for spasms.     Ergocalciferol (VITAMIN D2 PO) Take 1.25 mg by mouth once a week.     fludrocortisone (FLORINEF) 0.1 MG tablet Take 1 tablet (0.1 mg total) by mouth every evening. 30 tablet 6   levothyroxine (SYNTHROID) 150 MCG tablet Take 150 mcg by mouth daily before breakfast.     lidocaine (LIDODERM) 5 % Place 1 patch onto the skin daily as needed (Knee pain). Remove & Discard patch within 12 hours or as directed by MD     linaclotide (LINZESS) 290 MCG CAPS capsule Take 290 mcg by mouth daily before breakfast.     nitroGLYCERIN (NITROSTAT) 0.4 MG SL tablet Place 1 tablet (0.4 mg total) under the tongue every 5 (five) minutes as needed for chest pain. 25 tablet 2   omeprazole (PRILOSEC) 40 MG capsule Take 40 mg by mouth daily.     OVER THE COUNTER MEDICATION Take 2 tablets by mouth in the morning. [facility]     polyethylene glycol (MIRALAX / GLYCOLAX) 17 g packet Take 17 g by mouth daily as needed for mild constipation or moderate constipation.     rosuvastatin (CRESTOR) 5 MG tablet TAKE ONE TABLET BY MOUTH DAILY 90 tablet 3   Semaglutide,0.25 or 0.5MG /DOS, (OZEMPIC, 0.25 OR 0.5 MG/DOSE,) 2 MG/1.5ML SOPN Inject into the skin.      traMADol (ULTRAM) 50 MG tablet Take 1 tablet (50 mg total) by mouth every 6 (six) hours as needed for moderate pain. 20 tablet 0   No current facility-administered medications for this visit.    Physical Exam: ***  Diagnostic Tests: ***  A/P:  S/P AVR performed 11/24/2021.Marland Kitchen developed pain in setting of sternal wire... she underwent removal of 11/29/2022   Lowella Dandy, PA-C [facility] [phone]

## 2022-12-14 ENCOUNTER — Ambulatory Visit (INDEPENDENT_AMBULATORY_CARE_PROVIDER_SITE_OTHER): Payer: Self-pay | Admitting: Physician Assistant

## 2022-12-14 VITALS — BP 106/69 | HR 72 | Resp 20 | Ht 68.0 in | Wt 293.0 lb

## 2022-12-14 DIAGNOSIS — Z953 Presence of xenogenic heart valve: Secondary | ICD-10-CM

## 2022-12-19 ENCOUNTER — Encounter (HOSPITAL_COMMUNITY): Payer: Self-pay | Admitting: Internal Medicine

## 2022-12-19 ENCOUNTER — Other Ambulatory Visit: Payer: Self-pay | Admitting: Specialist

## 2022-12-19 DIAGNOSIS — Z13820 Encounter for screening for osteoporosis: Secondary | ICD-10-CM

## 2022-12-19 NOTE — Progress Notes (Addendum)
Anesthesia Review:  PCP: Juleen China  Cardiologist : Jacinto Halim  LOV 09/21/22  Cardiovascular- DR Morton Peters  Oncology/Hematology- DR Melvyn Neth in [place] - LOV 10/16/22  Chest x-ray : 11/21/22  EKG : 11/29/22  Echo : 09/13/2022  Stress test: Cardiac Cath :  11/08/2021  Activity level:  Sleep Study/ CPAP : has sleep apnea does not use cpap  Fasting Blood Sugar :      / Checks Blood Sugar -- times a day:   Blood Thinner/ Instructions /Last Dose: ASA / Instructions/ Last Dose :     Ozempic- used for weight loss-  last dose on 12/22/2022  Hgba1c-4.9 on 10/2022 per pt  PT reports not diabetes on 12/19/22 preproceure instructions.    11/29/22- PLTC- 60  pt with hx of decreased platelets.  Instructed pt to call DR Marina Goodell and DR Melvyn Neth in [place] and make them aware.  PT voiced understanding.    PT has received bowel prep instructions from MD>     11/29/22- Sternal wire removal - DR Dorris Fetch  AVR- DR PVT - 11/2021    Hx of difficult intubation per pt

## 2022-12-27 DIAGNOSIS — R509 Fever, unspecified: Secondary | ICD-10-CM | POA: Diagnosis not present

## 2022-12-27 DIAGNOSIS — J40 Bronchitis, not specified as acute or chronic: Secondary | ICD-10-CM | POA: Diagnosis not present

## 2022-12-29 ENCOUNTER — Telehealth: Payer: Self-pay | Admitting: Internal Medicine

## 2022-12-29 DIAGNOSIS — J180 Bronchopneumonia, unspecified organism: Secondary | ICD-10-CM | POA: Diagnosis not present

## 2022-12-29 NOTE — Telephone Encounter (Signed)
Spoke with patient made her aware she can proceed as scheduled on her current medications. Made patient aware that she needed to take all meds prior to 4:30 AM on the day of the procedure or she would need to hold them. Patient is aware she is to be NPO 4 hours before procedure start time.

## 2022-12-29 NOTE — Telephone Encounter (Signed)
Patients spouse called stated the patient was just seen at the ED and they have her on Dicyclomine and Amoxicillin. She is scheduled for a hospital procedure on 01/01/23 would like to know if she can proceed.

## 2022-12-30 ENCOUNTER — Encounter (HOSPITAL_COMMUNITY): Payer: Self-pay | Admitting: Internal Medicine

## 2022-12-30 NOTE — Anesthesia Preprocedure Evaluation (Signed)
Anesthesia Evaluation  Patient identified by MRN, date of birth, ID band  Reviewed: Allergy & Precautions, Patient's Chart, lab work & pertinent test results  History of Anesthesia Complications (+) DIFFICULT AIRWAY and history of anesthetic complications (easy VideoGlide in past)  Airway Mallampati: III   Neck ROM: Full  Mouth opening: Limited Mouth Opening  Dental  (+) Dental Advisory Given   Pulmonary sleep apnea (does not tolerate CPAP)    breath sounds clear to auscultation       Cardiovascular hypertension, + Valvular Problems/Murmurs (s/p AVR)  Rhythm:Regular Rate:Normal  09/13/2022 ECHO: EF 60-65%, normal LVF with mild LVH, S/P AVR with no AI, mild TR   Neuro/Psych  Headaches    GI/Hepatic Neg liver ROS, hiatal hernia,GERD  Controlled and Medicated,,  Endo/Other  diabetesHypothyroidism  Morbid obesityBMI 44.55  Renal/GU Renal InsufficiencyRenal disease     Musculoskeletal  (+) Arthritis ,  Fibromyalgia -  Abdominal   Peds  Hematology Plt 61k, Hb 13.2   Anesthesia Other Findings   Reproductive/Obstetrics                              Anesthesia Physical Anesthesia Plan  ASA: 3  Anesthesia Plan: MAC   Post-op Pain Management: Minimal or no pain anticipated   Induction: Intravenous  PONV Risk Score and Plan: 2 and Propofol infusion  Airway Management Planned: Natural Airway and Simple Face Mask  Additional Equipment: None  Intra-op Plan:   Post-operative Plan:   Informed Consent: I have reviewed the patients History and Physical, chart, labs and discussed the procedure including the risks, benefits and alternatives for the proposed anesthesia with the patient or authorized representative who has indicated his/her understanding and acceptance.     Dental advisory given  Plan Discussed with: CRNA and Anesthesiologist  Anesthesia Plan Comments: (PAT note written 11/28/2022  by Shonna Chock, PA-C.   DIFFICULT INTUBATION 11/24/21: difficulty was unanticipated. No view with MAC #3, mast ventilation without difficulty; on 2nd attempt successful intubation using Glidescope and 3, 7.5 mm ETT.)        Anesthesia Quick Evaluation

## 2023-01-01 ENCOUNTER — Ambulatory Visit (HOSPITAL_COMMUNITY)
Admission: RE | Admit: 2023-01-01 | Discharge: 2023-01-01 | Disposition: A | Discharge status: Home / Self Care | Payer: PPO | Source: Home / Self Care | Attending: Internal Medicine | Admitting: Internal Medicine

## 2023-01-01 ENCOUNTER — Encounter (HOSPITAL_COMMUNITY): Payer: Self-pay | Admitting: Internal Medicine

## 2023-01-01 ENCOUNTER — Encounter (HOSPITAL_COMMUNITY)
Admission: RE | Disposition: A | Discharge status: Home / Self Care | Payer: Self-pay | Source: Home / Self Care | Attending: Internal Medicine

## 2023-01-01 ENCOUNTER — Other Ambulatory Visit: Payer: Self-pay

## 2023-01-01 ENCOUNTER — Ambulatory Visit (HOSPITAL_BASED_OUTPATIENT_CLINIC_OR_DEPARTMENT_OTHER): Payer: PPO | Admitting: Anesthesiology

## 2023-01-01 ENCOUNTER — Ambulatory Visit (HOSPITAL_COMMUNITY): Payer: PPO | Admitting: Anesthesiology

## 2023-01-01 DIAGNOSIS — Z86010 Personal history of colonic polyps: Secondary | ICD-10-CM | POA: Diagnosis not present

## 2023-01-01 DIAGNOSIS — Z1211 Encounter for screening for malignant neoplasm of colon: Secondary | ICD-10-CM

## 2023-01-01 DIAGNOSIS — D122 Benign neoplasm of ascending colon: Secondary | ICD-10-CM | POA: Insufficient documentation

## 2023-01-01 DIAGNOSIS — Z83719 Family history of colon polyps, unspecified: Secondary | ICD-10-CM | POA: Insufficient documentation

## 2023-01-01 DIAGNOSIS — K5730 Diverticulosis of large intestine without perforation or abscess without bleeding: Secondary | ICD-10-CM | POA: Diagnosis not present

## 2023-01-01 DIAGNOSIS — D120 Benign neoplasm of cecum: Secondary | ICD-10-CM | POA: Diagnosis not present

## 2023-01-01 DIAGNOSIS — I10 Essential (primary) hypertension: Secondary | ICD-10-CM

## 2023-01-01 DIAGNOSIS — K635 Polyp of colon: Secondary | ICD-10-CM | POA: Diagnosis not present

## 2023-01-01 DIAGNOSIS — E039 Hypothyroidism, unspecified: Secondary | ICD-10-CM | POA: Diagnosis not present

## 2023-01-01 HISTORY — PX: COLONOSCOPY WITH PROPOFOL: SHX5780

## 2023-01-01 HISTORY — DX: Atherosclerotic heart disease of native coronary artery without angina pectoris: I25.10

## 2023-01-01 HISTORY — DX: Heart failure, unspecified: I50.9

## 2023-01-01 HISTORY — PX: POLYPECTOMY: SHX5525

## 2023-01-01 SURGERY — COLONOSCOPY WITH PROPOFOL
Anesthesia: Monitor Anesthesia Care

## 2023-01-01 MED ORDER — LACTATED RINGERS IV SOLN: INTRAVENOUS | Status: DC

## 2023-01-01 MED ORDER — PROPOFOL 10 MG/ML IV BOLUS
INTRAVENOUS | Status: AC
  Filled 2023-01-01: qty 20

## 2023-01-01 MED ORDER — PROPOFOL 500 MG/50ML IV EMUL
INTRAVENOUS | Status: AC
  Filled 2023-01-01: qty 200

## 2023-01-01 MED ORDER — SODIUM CHLORIDE 0.9 % IV SOLN: INTRAVENOUS | Status: DC

## 2023-01-01 MED ORDER — FLEET ENEMA 7-19 GM/118ML RE ENEM
1.0000 | ENEMA | Freq: Once | RECTAL | Status: AC
  Administered 2023-01-01: 1 via RECTAL

## 2023-01-01 MED ORDER — LIDOCAINE HCL (CARDIAC) PF 100 MG/5ML IV SOSY
PREFILLED_SYRINGE | INTRAVENOUS | Status: DC | PRN
  Administered 2023-01-01: 60 mg via INTRAVENOUS

## 2023-01-01 MED ORDER — PROPOFOL 500 MG/50ML IV EMUL
INTRAVENOUS | Status: DC | PRN
  Administered 2023-01-01: 30 mg via INTRAVENOUS
  Administered 2023-01-01: 155 ug/kg/min via INTRAVENOUS

## 2023-01-01 MED ORDER — FLEET ENEMA 7-19 GM/118ML RE ENEM
ENEMA | RECTAL | Status: AC
  Filled 2023-01-01: qty 1

## 2023-01-01 MED ORDER — PROPOFOL 1000 MG/100ML IV EMUL
INTRAVENOUS | Status: AC
  Filled 2023-01-01: qty 400

## 2023-01-01 SURGICAL SUPPLY — 22 items
ELECT REM PT RETURN 9FT ADLT (ELECTROSURGICAL)
ELECTRODE REM PT RTRN 9FT ADLT (ELECTROSURGICAL) IMPLANT
FCP BXJMBJMB 240X2.8X (CUTTING FORCEPS)
FLOOR PAD 36X40 (MISCELLANEOUS) ×2
FORCEPS BIOP RAD 4 LRG CAP 4 (CUTTING FORCEPS) IMPLANT
FORCEPS BIOP RJ4 240 W/NDL (CUTTING FORCEPS)
FORCEPS BXJMBJMB 240X2.8X (CUTTING FORCEPS) IMPLANT
INJECTOR/SNARE I SNARE (MISCELLANEOUS) IMPLANT
LUBRICANT JELLY 4.5OZ STERILE (MISCELLANEOUS) IMPLANT
MANIFOLD NEPTUNE II (INSTRUMENTS) IMPLANT
NDL SCLEROTHERAPY 25GX240 (NEEDLE) IMPLANT
NEEDLE SCLEROTHERAPY 25GX240 (NEEDLE)
PAD FLOOR 36X40 (MISCELLANEOUS) ×3 IMPLANT
PROBE APC STR FIRE (PROBE) IMPLANT
PROBE INJECTION GOLD (MISCELLANEOUS)
PROBE INJECTION GOLD 7FR (MISCELLANEOUS) IMPLANT
SNARE ROTATE MED OVAL 20MM (MISCELLANEOUS) IMPLANT
SYR 50ML LL SCALE MARK (SYRINGE) IMPLANT
TRAP SPECIMEN MUCOUS 40CC (MISCELLANEOUS) IMPLANT
TUBING ENDO SMARTCAP PENTAX (MISCELLANEOUS) IMPLANT
TUBING IRRIGATION ENDOGATOR (MISCELLANEOUS) ×3 IMPLANT
WATER STERILE IRR 1000ML POUR (IV SOLUTION) IMPLANT

## 2023-01-01 NOTE — H&P (Signed)
HISTORY OF PRESENT ILLNESS:  Joanna Alvarado is a 67 y.o. female with a personal history of multiple adenomatous colon polyps.  Now for surveillance colonoscopy.  In addition to her prep, she received preprocedure enemas  REVIEW OF SYSTEMS:  All non-GI ROS negative.  Past Medical History:  Diagnosis Date   Abnormal myocardial perfusion study 09/26/2019   Anemia    Angina pectoris (HCC) - Class II-III 09/26/2019   Aortic valve insufficiency    Arthritis    KNEES   Benign neoplasm of ascending colon    Benign neoplasm of cecum    Benign neoplasm of descending colon    Benign neoplasm of sigmoid colon    Benign neoplasm of transverse colon    Bowel habit changes    Cataract    BILATERAL-REMOVED   CHF (congestive heart failure) (HCC)    Chronic postoperative pain 12/02/2012   Complication of anesthesia    Coronary artery disease    Diabetes mellitus without complication (HCC)    Difficult intubation    Disturbance of skin sensation 12/02/2012   Fibromyalgia    GERD (gastroesophageal reflux disease)    Head injury    scalp injury x2 required sutures   Headache    prior to hyster   Heart murmur    History of blood transfusion    History of colonic polyps    History of hiatal hernia    History of kidney stones    History of transfusion of platelets 11/2021   Hypertension    Hypothyroidism    Idiopathic thrombocytopenic purpura (ITP) (HCC)    Myalgia and myositis, unspecified 12/02/2012   Personal history of colonic polyps    Pneumonia    Primary hypothyroidism 11/30/2015   Senile nuclear sclerosis 04/24/2014   Sleep apnea    Thyroid disease    Thyroid nodule 11/30/2015   Type 2 diabetes mellitus without complications (HCC) 05/27/2018   11/17/22- A1C 4.9   Vitamin D deficiency 11/30/2015    Past Surgical History:  Procedure Laterality Date   ABDOMINAL HYSTERECTOMY     AORTIC VALVE REPLACEMENT N/A 11/24/2021   Procedure: AORTIC VALVE REPLACEMENT (AVR) USING  INSPIRIS RESILIA  AORTIC VALVE;  Surgeon: Lovett Sox, MD;  Location: [facility] OR;  Service: Open Heart Surgery;  Laterality: N/A;   APPENDECTOMY     CHOLECYSTECTOMY     COLONOSCOPY N/A 07/27/2015   Procedure: COLONOSCOPY;  Surgeon: Hilarie Fredrickson, MD;  Location: [facility]COPY;  Service: Endoscopy;  Laterality: N/A;   COLONOSCOPY WITH PROPOFOL N/A 04/28/2019   Procedure: COLONOSCOPY WITH PROPOFOL;  Surgeon: Hilarie Fredrickson, MD;  Location: [facility];  Service: Endoscopy;  Laterality: N/A;   EYE SURGERY     bilateral cataracts with lens implants   floater bone surgery     bone removed from right hand   FOOT SURGERY     RIGHT   FOOT SURGERY Left    triple orthodesis   FRACTURE SURGERY     left foot surgery     removed tendon and placed pins in foot   LEFT HEART CATH AND CORONARY ANGIOGRAPHY N/A 09/26/2019   Procedure: LEFT HEART CATH AND CORONARY ANGIOGRAPHY;  Surgeon: Marykay Lex, MD;  Location[facility] INVASIVE CV LAB;  Service: Cardiovascular;  Laterality: N/A;   PLANTAR FASCIA SURGERY     both feet   POLYPECTOMY  04/28/2019   Procedure: POLYPECTOMY;  Surgeon: Hilarie Fredrickson, MD;  Location: [facility];  Service: Endoscopy;;   RIGHT HEART  CATH N/A 02/15/2021   Procedure: RIGHT HEART CATH;  Surgeon: Elder Negus, MD;  Location: [facility] INVASIVE CV LAB;  Service: Cardiovascular;  Laterality: N/A;   RIGHT/LEFT HEART CATH AND CORONARY ANGIOGRAPHY N/A 11/08/2021   Procedure: RIGHT/LEFT HEART CATH AND CORONARY ANGIOGRAPHY;  Surgeon: Yates Decamp, MD;  Location: [facility] INVASIVE CV LAB;  Service: Cardiovascular;  Laterality: N/A;   STERNAL WIRES REMOVAL N/A 11/29/2022   Procedure: STERNAL WIRES REMOVAL;  Surgeon: Loreli Slot, MD;  Location: [facility] OR;  Service: Thoracic;  Laterality: N/A;   TEE WITHOUT CARDIOVERSION N/A 11/24/2021   Procedure: TRANSESOPHAGEAL ECHOCARDIOGRAM (TEE);  Surgeon: Lovett Sox, MD;  Location: [facility] OR;  Service: Open Heart Surgery;  Laterality: N/A;   TONSILLECTOMY       Social History Joanna Alvarado  reports that she has never smoked. She has never used smokeless tobacco. She reports that she does not drink alcohol and does not use drugs.  family history includes Colon polyps in her brother; Diabetes in her mother; Heart disease in her paternal grandmother; Heart disease (age of onset: 28) in her mother; Hypertension in her father and mother; Stroke in her maternal grandmother; Throat cancer in her paternal grandfather.  Allergies  Allergen Reactions   Adhesive [Tape]     Latex, band aids  causes whelts and blisters   Dermabond causes, welts   Psyllium Other (See Comments)    Severe constipation   Trulicity [Dulaglutide]     KIDNEY INFECTION,INABILITY TO HAVE BOWEL MOVEMENTS,BLOOD IN URINE   Hydrocodone-Acetaminophen Itching    Tolerates small/ infrequent doses   Latex Other (See Comments)    Band-aids causes whelts and blisters   Oxycontin [Oxycodone Hcl] Other (See Comments)    "FELT LIKE HEAD WAS GOING TO EXPLODE" in head   Ciprofloxacin Rash    Yeast infection and a severe rash    Codeine Itching and Rash   Other Palpitations    Steroids      "makes me feel like I'm having a heart attack. -       PHYSICAL EXAMINATION: Vital signs: BP 115/73   Pulse 85   Temp (!) 97.1 F (36.2 C) (Temporal)   Resp 16   Ht 5\' 8"  (1.727 m)   Wt (!) 137.9 kg   SpO2 97%   BMI 46.22 kg/m  General: Well-developed, well-nourished, no acute distress HEENT: Sclerae are anicteric, conjunctiva pink. Oral mucosa intact Lungs: Clear Heart: Regular Abdomen: soft, nontender, nondistended, no obvious ascites, no peritoneal signs, normal bowel sounds. No organomegaly. Extremities: No edema Psychiatric: alert and oriented x3. Cooperative     ASSESSMENT:  Personal history of multiple adenomatous colon polyps   PLAN:   Surveillance colonoscopy

## 2023-01-01 NOTE — Discharge Instructions (Signed)

## 2023-01-01 NOTE — Op Note (Signed)
[facility] Patient Name: Joanna Alvarado Procedure Date: 01/01/2023 MRN: [id number] Attending MD: Wilhemina Bonito. Marina Goodell , MD, [id number] Date of Birth: [birth year] CSN: [id number] Age: 67 Admit Type: Outpatient Procedure:                Colonoscopy with cold snare polypectomy x 3 Indications:              High risk colon cancer surveillance: Personal                            history of multiple (3 or more) adenomas. Previous                            examinations 2003, 2006, 2011, 2017, 2020 Providers:                Wilhemina Bonito. Marina Goodell, MD, Stephens Shire RN, RN, Cephus Richer, RN, Rozetta Nunnery, Technician Referring MD:             Rodman Key, MD Medicines:                Monitored Anesthesia Care Complications:            No immediate complications. Estimated blood loss:                            None. Estimated Blood Loss:     Estimated blood loss: none. Procedure:                Pre-Anesthesia Assessment:                           - Prior to the procedure, a History and Physical                            was performed, and patient medications and                            allergies were reviewed. The patient's tolerance of                            previous anesthesia was also reviewed. The risks                            and benefits of the procedure and the sedation                            options and risks were discussed with the patient.                            All questions were answered, and informed consent                            was obtained. Prior Anticoagulants: The patient has  taken no anticoagulant or antiplatelet agents. ASA                            Grade Assessment: II - A patient with mild systemic                            disease. After reviewing the risks and benefits,                            the patient was deemed in satisfactory condition to                            undergo the  procedure.                           After obtaining informed consent, the colonoscope                            was passed under direct vision. Throughout the                            procedure, the patient's blood pressure, pulse, and                            oxygen saturations were monitored continuously. The                            CF-HQ190L ([id number]) Olympus colonoscope was                            introduced through the anus and advanced to the the                            cecum, identified by appendiceal orifice and                            ileocecal valve. The ileocecal valve, appendiceal                            orifice, and rectum were photographed. The quality                            of the bowel preparation was excellent. The                            colonoscopy was performed without difficulty. The                            patient tolerated the procedure well. The bowel                            preparation used was SUPREP via split dose  instruction. Scope In: 9:52:15 AM Scope Out: 10:05:58 AM Scope Withdrawal Time: 0 hours 11 minutes 19 seconds  Total Procedure Duration: 0 hours 13 minutes 43 seconds  Findings:      Three polyps were found in the ascending colon and cecum. The polyps       were 2 to 4 mm in size. These polyps were removed with a cold snare.       Resection and retrieval were complete.      Diverticula were found in the left colon.      The exam was otherwise without abnormality on direct and retroflexion       views. Impression:               - Three 2 to 4 mm polyps in the ascending colon and                            in the cecum, removed with a cold snare. Resected                            and retrieved.                           - Diverticulosis in the left colon.                           - The examination was otherwise normal on direct                            and retroflexion views. Moderate  Sedation:      none Recommendation:           - Repeat colonoscopy in 5 years for surveillance.                           - Patient has a contact number available for                            emergencies. The signs and symptoms of potential                            delayed complications were discussed with the                            patient. Return to normal activities tomorrow.                            Written discharge instructions were provided to the                            patient.                           - Resume previous diet.                           - Continue present medications.                           -  Await pathology results. Procedure Code(s):        --- Professional ---                           [phone], Colonoscopy, flexible; with removal of                            tumor(s), polyp(s), or other lesion(s) by snare                            technique Diagnosis Code(s):        --- Professional ---                           Z86.010, Personal history of colonic polyps                           D12.2, Benign neoplasm of ascending colon                           D12.0, Benign neoplasm of cecum                           K57.30, Diverticulosis of large intestine without                            perforation or abscess without bleeding CPT copyright 2022 [employer]. All rights reserved. The codes documented in this report are preliminary and upon coder review may  be revised to meet current compliance requirements. Wilhemina Bonito. Marina Goodell, MD 01/01/2023 10:23:20 AM This report has been signed electronically. Number of Addenda: 0

## 2023-01-01 NOTE — Transfer of Care (Signed)
Immediate Anesthesia Transfer of Care Note  Patient: Joanna Alvarado  Procedure(s) Performed: Procedure(s): COLONOSCOPY WITH PROPOFOL (N/A) POLYPECTOMY  Patient Location: Nursing Unit and Endoscopy Unit  Anesthesia Type:MAC  Level of Consciousness:  sedated, patient cooperative and responds to stimulation  Airway & Oxygen Therapy:Patient Spontanous Breathing and Patient connected to face mask oxgen  Post-op Assessment:  Report given to PACU RN and Post -op Vital signs reviewed and stable  Post vital signs:  Reviewed and stable  Last Vitals:  Vitals:   01/01/23 0814  BP: 115/73  Pulse: 85  Resp: 16  Temp: (!) 36.2 C  SpO2: 97%    Complications: No apparent anesthesia complications

## 2023-01-01 NOTE — Anesthesia Postprocedure Evaluation (Signed)
Anesthesia Post Note  Patient: Joanna Alvarado  Procedure(s) Performed: COLONOSCOPY WITH PROPOFOL POLYPECTOMY     Patient location during evaluation: PACU Anesthesia Type: MAC Level of consciousness: awake and alert Pain management: pain level controlled Vital Signs Assessment: post-procedure vital signs reviewed and stable Respiratory status: spontaneous breathing, nonlabored ventilation, respiratory function stable and patient connected to nasal cannula oxygen Cardiovascular status: stable and blood pressure returned to baseline Postop Assessment: no apparent nausea or vomiting Anesthetic complications: no   No notable events documented.  Last Vitals:  Vitals:   01/01/23 1030 01/01/23 1035  BP: 114/63   Pulse: 71   Resp: 14 19  Temp:    SpO2: 100%     Last Pain:  Vitals:   01/01/23 1030  TempSrc:   PainSc: 0-No pain                 Nanako Stopher

## 2023-01-03 ENCOUNTER — Telehealth: Payer: Self-pay | Admitting: Internal Medicine

## 2023-01-03 DIAGNOSIS — Z6841 Body Mass Index (BMI) 40.0 and over, adult: Secondary | ICD-10-CM | POA: Diagnosis not present

## 2023-01-03 DIAGNOSIS — R7309 Other abnormal glucose: Secondary | ICD-10-CM | POA: Diagnosis not present

## 2023-01-03 DIAGNOSIS — R300 Dysuria: Secondary | ICD-10-CM | POA: Diagnosis not present

## 2023-01-03 MED ORDER — LINACLOTIDE 290 MCG PO CAPS: 290.0000 ug | ORAL_CAPSULE | Freq: Every day | ORAL | Status: AC

## 2023-01-03 NOTE — Telephone Encounter (Signed)
Spoke with patient who stated she is currently having trouble affording her Linzess and requested samples.I told her I would put some up front to be picked up.  Patient agreed.

## 2023-01-03 NOTE — Telephone Encounter (Signed)
Patient called requesting to speak with a nurse regarding Linz[employer]. No other information was provided.

## 2023-01-04 ENCOUNTER — Encounter (HOSPITAL_COMMUNITY): Payer: Self-pay | Admitting: Internal Medicine

## 2023-01-04 DIAGNOSIS — R300 Dysuria: Secondary | ICD-10-CM | POA: Diagnosis not present

## 2023-01-04 DIAGNOSIS — R7309 Other abnormal glucose: Secondary | ICD-10-CM | POA: Diagnosis not present

## 2023-01-08 DIAGNOSIS — M1711 Unilateral primary osteoarthritis, right knee: Secondary | ICD-10-CM | POA: Diagnosis not present

## 2023-01-30 DIAGNOSIS — M1711 Unilateral primary osteoarthritis, right knee: Secondary | ICD-10-CM | POA: Diagnosis not present

## 2023-01-30 DIAGNOSIS — M25561 Pain in right knee: Secondary | ICD-10-CM | POA: Diagnosis not present

## 2023-02-01 DIAGNOSIS — Z131 Encounter for screening for diabetes mellitus: Secondary | ICD-10-CM | POA: Diagnosis not present

## 2023-02-06 ENCOUNTER — Ambulatory Visit: Payer: PPO | Admitting: Cardiology

## 2023-02-06 ENCOUNTER — Encounter: Payer: Self-pay | Admitting: Cardiology

## 2023-02-06 VITALS — BP 129/79 | HR 97 | Resp 12 | Ht 68.0 in | Wt 295.0 lb

## 2023-02-06 DIAGNOSIS — R002 Palpitations: Secondary | ICD-10-CM

## 2023-02-06 DIAGNOSIS — E559 Vitamin D deficiency, unspecified: Secondary | ICD-10-CM | POA: Diagnosis not present

## 2023-02-06 DIAGNOSIS — Z953 Presence of xenogenic heart valve: Secondary | ICD-10-CM | POA: Diagnosis not present

## 2023-02-06 DIAGNOSIS — E041 Nontoxic single thyroid nodule: Secondary | ICD-10-CM | POA: Diagnosis not present

## 2023-02-06 DIAGNOSIS — E119 Type 2 diabetes mellitus without complications: Secondary | ICD-10-CM | POA: Diagnosis not present

## 2023-02-06 DIAGNOSIS — I209 Angina pectoris, unspecified: Secondary | ICD-10-CM | POA: Diagnosis not present

## 2023-02-06 DIAGNOSIS — E039 Hypothyroidism, unspecified: Secondary | ICD-10-CM | POA: Diagnosis not present

## 2023-02-06 MED ORDER — NITROGLYCERIN 0.4 MG SL SUBL
0.4000 mg | SUBLINGUAL_TABLET | SUBLINGUAL | 2 refills | Status: AC | PRN
Start: 2023-02-06 — End: ?

## 2023-02-06 MED ORDER — METOPROLOL TARTRATE 25 MG PO TABS
25.0000 mg | ORAL_TABLET | Freq: Three times a day (TID) | ORAL | 2 refills | Status: DC | PRN
Start: 2023-02-06 — End: 2024-04-19

## 2023-02-06 NOTE — Progress Notes (Signed)
Primary Physician/Referring:  Marylen Ponto, MD  Patient ID: Joanna Alvarado, female    DOB: [birth year], 67 y.o.   MRN: [id number]  Chief Complaint  Patient presents with  . Atrial Fibrillation  . Follow-up   HPI:    Joanna Alvarado  is a 67 y.o. ffemale with a past medical history of hypertension, severe aortic regurgitation S/P BIOPROSTHETIC AV replacement 11/24/2021, chronic thrombocytopenia, morbid obesity and OSA on CPAP, GERD, hiatal hernia, hypercholesterolemia, hypothyroidism and chronic stable angina with normal coronary arteries/minimal coronary artery disease by angiography in April 2021, orthostatic hypotension.  Patient made an early appointment to see me as she had rapid palpitations about 3 to 4 days ago, thought that she was in atrial fibrillation as she woke up from sleep lasted for about 2 minutes.  She has not had any recurrence.  She is extremely anxious about her cardiac issues and wanted to be seen.  She is accompanied by her husband. Otherwise denies chest pain, no change in shortness of breath, denies leg edema.     Past Surgical History:  Procedure Laterality Date  . ABDOMINAL HYSTERECTOMY    . AORTIC VALVE REPLACEMENT N/A 11/24/2021   Procedure: AORTIC VALVE REPLACEMENT (AVR) USING INSPIRIS RESILIA  AORTIC VALVE;  Surgeon: Lovett Sox, MD;  Location: [facility] OR;  Service: Open Heart Surgery;  Laterality: N/A;  . APPENDECTOMY    . CHOLECYSTECTOMY    . COLONOSCOPY N/A 07/27/2015   Procedure: COLONOSCOPY;  Surgeon: Hilarie Fredrickson, MD;  Location: [facility];  Service: Endoscopy;  Laterality: N/A;  . COLONOSCOPY WITH PROPOFOL N/A 04/28/2019   Procedure: COLONOSCOPY WITH PROPOFOL;  Surgeon: Hilarie Fredrickson, MD;  Location: [facility];  Service: Endoscopy;  Laterality: N/A;  . COLONOSCOPY WITH PROPOFOL N/A 01/01/2023   Procedure: COLONOSCOPY WITH PROPOFOL;  Surgeon: Hilarie Fredrickson, MD;  Location: [facility];  Service: Gastroenterology;  Laterality: N/A;  . EYE  SURGERY     bilateral cataracts with lens implants  . floater bone surgery     bone removed from right hand  . FOOT SURGERY     RIGHT  . FOOT SURGERY Left    triple orthodesis  . FRACTURE SURGERY    . left foot surgery     removed tendon and placed pins in foot  . LEFT HEART CATH AND CORONARY ANGIOGRAPHY N/A 09/26/2019   Procedure: LEFT HEART CATH AND CORONARY ANGIOGRAPHY;  Surgeon: Marykay Lex, MD;  Location: [facility] INVASIVE CV LAB;  Service: Cardiovascular;  Laterality: N/A;  . PLANTAR FASCIA SURGERY     both feet  . POLYPECTOMY  04/28/2019   Procedure: POLYPECTOMY;  Surgeon: Hilarie Fredrickson, MD;  Location: [facility];  Service: Endoscopy;;  . POLYPECTOMY  01/01/2023   Procedure: POLYPECTOMY;  Surgeon: Hilarie Fredrickson, MD;  Location: [facility];  Service: Gastroenterology;;  . RIGHT HEART CATH N/A 02/15/2021   Procedure: RIGHT HEART CATH;  Surgeon: Elder Negus, MD;  Location: [facility] INVASIVE CV LAB;  Service: Cardiovascular;  Laterality: N/A;  . RIGHT/LEFT HEART CATH AND CORONARY ANGIOGRAPHY N/A 11/08/2021   Procedure: RIGHT/LEFT HEART CATH AND CORONARY ANGIOGRAPHY;  Surgeon: Yates Decamp, MD;  Location: [facility] INVASIVE CV LAB;  Service: Cardiovascular;  Laterality: N/A;  . STERNAL WIRES REMOVAL N/A 11/29/2022   Procedure: STERNAL WIRES REMOVAL;  Surgeon: Loreli Slot, MD;  Location: [facility] OR;  Service: Thoracic;  Laterality: N/A;  . TEE WITHOUT CARDIOVERSION N/A 11/24/2021   Procedure: TRANSESOPHAGEAL ECHOCARDIOGRAM (TEE);  Surgeon:  Lovett Sox, MD;  Location: [facility] OR;  Service: Open Heart Surgery;  Laterality: N/A;  . TONSILLECTOMY      Social History   Tobacco Use  . Smoking status: Never  . Smokeless tobacco: Never  Substance Use Topics  . Alcohol use: Never   Marital Status: Married   ROS  Review of Systems  Cardiovascular:  Positive for dyspnea on exertion (stable) and palpitations. Negative for chest pain, leg swelling and orthopnea.  Gastrointestinal:   Negative for nausea and vomiting.  Neurological:  Positive for light-headedness.   Objective  Blood pressure 129/79, pulse 97, resp. rate 12, height 5\' 8"  (1.727 m), weight 295 lb (133.8 kg), SpO2 96%.      02/06/2023   11:27 AM 01/01/2023   10:30 AM 01/01/2023   10:25 AM  Vitals with BMI  Height 5\' 8"     Weight 295 lbs    BMI 44.86    Systolic 129 114 401  Diastolic 79 63 63  Pulse 97 71 88    Orthostatic VS for the past 72 hrs (Last 3 readings):  Patient Position BP Location Cuff Size  02/06/23 1127 Sitting Left Arm Large     Physical Exam Constitutional:      Appearance: She is morbidly obese.     Comments: Morbidly obese. No acute distress.  Neck:     Vascular: No JVD.  Cardiovascular:     Rate and Rhythm: Normal rate and regular rhythm.     Pulses: Intact distal pulses.          Radial pulses are 2+ on the right side and 2+ on the left side.       Popliteal pulses are 0 on the right side and 0 on the left side.       Dorsalis pedis pulses are 1+ on the right side and 1+ on the left side.       Posterior tibial pulses are 0 on the right side and 0 on the left side.     Heart sounds: Murmur heard.     Midsystolic murmur is present with a grade of 2/6 at the upper right sternal border. Soft murmur through AV     No gallop.  Pulmonary:     Effort: Pulmonary effort is normal.     Breath sounds: Normal breath sounds.  Abdominal:     General: Bowel sounds are normal.     Palpations: Abdomen is soft.     Comments: Large pannus  Musculoskeletal:     Right lower leg: No edema.     Left lower leg: No edema.   Laboratory examination:   Recent Labs    04/17/22 1016 10/16/22 1006 11/29/22 1054  NA 147* 143 139  K 3.6 3.6 3.2*  CL 111 112* 111  CO2 24 24 20*  GLUCOSE 101* 94 97  BUN 20 9 10   CREATININE 1.27* 0.91 0.92  CALCIUM 9.4 10.6* 9.5  GFRNONAA 46* >60 >60       Latest Ref Rng & Units 11/29/2022   10:54 AM 10/16/2022   10:06 AM 04/17/2022   10:16 AM   CMP  Glucose 70 - 99 mg/dL 97  94  027   BUN 8 - 23 mg/dL 10  9  20    Creatinine 0.44 - 1.00 mg/dL 2.53  6.64  4.03   Sodium 135 - 145 mmol/L 139  143  147   Potassium 3.5 - 5.1 mmol/L 3.2  3.6  3.6   Chloride  98 - 111 mmol/L 111  112  111   CO2 22 - 32 mmol/L 20  24  24    Calcium 8.9 - 10.3 mg/dL 9.5  29.5  9.4   Total Protein 6.5 - 8.1 g/dL 4.9  5.7  5.9   Total Bilirubin 0.3 - 1.2 mg/dL 1.4  1.4  1.0   Alkaline Phos 38 - 126 U/L 128  100  109   AST 15 - 41 U/L 48  41  34   ALT 0 - 44 U/L 29  20  17        Latest Ref Rng & Units 11/29/2022   10:54 AM 10/16/2022   12:00 AM 03/13/2022    3:32 PM  CBC  WBC 4.0 - 10.5 K/uL 3.8  5.0     4.4   Hemoglobin 12.0 - 15.0 g/dL 28.4  13.2     44.0   Hematocrit 36.0 - 46.0 % 39.3  42     34.2   Platelets 150 - 400 K/uL 61  86     87      This result is from an external source.   Lab Results  Component Value Date   CHOL 119 09/30/2019   HDL 37 (L) 09/30/2019   LDLCALC 63 09/30/2019   TRIG 103 09/30/2019   CHOLHDL 3.2 09/30/2019      External labs:   Labs 08/04/2022:  TSH markedly reduced at 0.06.  T4 elevated at 2.2.  Free T3 decreased at 2.08.  Vitamin D 52.  A1c 5.7%.  Labs 04/17/2022:  Sodium 147, potassium 3.6, BUN 20, creatinine 1.27, EGFR 46 mL.  Hb 11.1/HCT 34.2, platelets 87.  Radiology:   CT angiogram chest 10/07/2021: There is mild aortic atherosclerosis, no dissection. Fusiform aneurysmal dilatation of the ascending aorta, maximal dimension 4.2 cm. Recommend annual imaging followup by CTA or MRA. Tiny hiatal hernia.  CXR 2 view 01/02/2022: There is elevation of the left hemidiaphragm with left basilar atelectasis. There is gaseous distention of the stomach. This is increased compared to prior CT. No pleural effusion or pneumothorax identified. Cardiomediastinal silhouette is within normal limits. Patient is status post cardiac valve replacement.  Cardiac Studies:   Carotid artery duplex 09-Apr-2020: The  bifurcation, internal, external and common carotid arteries reveal no evidence of significant stenosis, bilaterally. Antegrade right vertebral artery flow. Antegrade left vertebral artery flow.   Comparison(s): No significant change from prior study. 09/06/2021. Previously LV was mildly dilated at 5.7 cm now 6.0 cm.  Right & Left Heart Catheterization 11/08/21:  LV: 134/9, EDP 21 mmHg.  Ao 130/52, mean 97 mmHg.  Pulse pressure 82 mmHg.  Moderately elevated LVEDP. LM: Large vessel: Smooth and normal. LAD: Large caliber vessel.  Gives origin to a moderate-sized D1 which immediately bifurcates into secondary branch.  Ostium has 85% focal stenosis.  D2 is moderate size, mild disease.  Mid segment of the LAD after D2 has 10 to 20% stenosis. CX: Gives origin to a small high OM1 followed by a moderate to large sized OM 2 and small OM 3 and OM 4.  AV groove circumflex is small to moderate-sized vessel with the ostium 10 to 20% stenosis. RCA: Dominant.  Mildly ectatic in the proximal segment.  Mild disease of 10 to 20% in the midsegment.  Slow flow in the right coronary artery. Ascending aortogram: There is moderate dilatation of the ascending aorta.  Aortic regurgitation is evident, severity was not assessed as it was a hand contrast injection.  Right  heart: RA 12/9, mean 8 mmHg RV 32/3, EDP 16 mmHg PA 27/6, mean 16 mmHg PA saturation 80%. PW: Not obtained as I had difficulty in advancing the PA catheter to the wedge position due to tortuosity of the peripheral vessels. CO 11.19 and CI 4.57.  QP/QS 1.00.  Recommendation: Patient will be reassessed by Dr. Maren Beach for consideration of Bentall procedure.  D1 is small to moderate size, will discuss with him regarding revascularization versus medical therapy only.  Echocardiogram 09/13/2022:  Normal LV systolic function with visual EF 60-65%. Left ventricle cavity is normal in size. Mild concentric hypertrophy of the left ventricle. Normal global wall  motion. Normal diastolic filling pattern, normal LAP. Calculated EF 65%. Left atrial cavity is moderately dilated at 4.8 cm. Well seated bioprosthetic aortic valve (Inspiris resilia size 25mm, per op note). Leaflets not well visualized. No significant aortic regurgitation Structurally normal tricuspid valve.  Mild tricuspid regurgitation. No evidence of pulmonary hypertension. RVSP measures 31 mmHg. The aortic root is mildly dilated at 4.0 cm. No significant change compared to 09/2021.  EKG:   EKG 02/06/2023: Normal sinus rhythm at rate of 93 bpm, left anterior fascicular block.  IVCD, LVH.  Poor R wave progression, probably normal variant.  Compared to 08/09/2022, no significant change.  Medications and allergies   Allergies  Allergen Reactions  . Adhesive [Tape]     Latex, band aids  causes whelts and blisters   Dermabond causes, welts  . Psyllium Other (See Comments)    Severe constipation  . Trulicity [Dulaglutide]     KIDNEY INFECTION,INABILITY TO HAVE BOWEL MOVEMENTS,BLOOD IN URINE  . Hydrocodone-Acetaminophen Itching    Tolerates small/ infrequent doses  . Latex Other (See Comments)    Band-aids causes whelts and blisters  . Oxycontin [Oxycodone Hcl] Other (See Comments)    "FELT LIKE HEAD WAS GOING TO EXPLODE" in head  . Ciprofloxacin Rash    Yeast infection and a severe rash   . Codeine Itching and Rash  . Other Palpitations    Steroids      "makes me feel like I'm having a heart attack. -    Current Outpatient Medications:  .  acetaminophen (TYLENOL) 500 MG tablet, Take 1,000 mg by mouth every 6 (six) hours as needed for moderate pain., Disp: , Rfl:  .  dicyclomine (BENTYL) 10 MG capsule, Take 10 mg by mouth 4 (four) times daily -  before meals and at bedtime., Disp: , Rfl:  .  ergocalciferol (VITAMIN D2) 1.25 MG (50000 UT) capsule, Take 50,000 Units by mouth every Tuesday., Disp: , Rfl:  .  fludrocortisone (FLORINEF) 0.1 MG tablet, Take 1 tablet (0.1 mg total) by  mouth every evening. (Patient taking differently: Take 0.1 mg by mouth daily.), Disp: 30 tablet, Rfl: 6 .  levothyroxine (SYNTHROID) 150 MCG tablet, Take 150 mcg by mouth daily before breakfast., Disp: , Rfl:  .  lidocaine (LIDODERM) 5 %, Place 1 patch onto the skin daily as needed (Knee pain). Remove & Discard patch within 12 hours or as directed by MD, Disp: , Rfl:  .  linaclotide (LINZESS) 290 MCG CAPS capsule, Take 1 capsule (290 mcg total) by mouth daily before breakfast., Disp: , Rfl:  .  metoprolol tartrate (LOPRESSOR) 25 MG tablet, Take 1 tablet (25 mg total) by mouth 3 (three) times daily as needed (Palpitations)., Disp: 30 tablet, Rfl: 2 .  omeprazole (PRILOSEC) 40 MG capsule, Take 40 mg by mouth daily., Disp: , Rfl:  .  OVER THE  COUNTER MEDICATION, Take 2 tablets by mouth in the morning. [facility], Disp: , Rfl:  .  polyethylene glycol (MIRALAX / GLYCOLAX) 17 g packet, Take 17 g by mouth daily as needed for mild constipation or moderate constipation., Disp: , Rfl:  .  rosuvastatin (CRESTOR) 5 MG tablet, TAKE ONE TABLET BY MOUTH DAILY, Disp: 90 tablet, Rfl: 3 .  Semaglutide,0.25 or 0.5MG /DOS, (OZEMPIC, 0.25 OR 0.5 MG/DOSE,) 2 MG/1.5ML SOPN, Inject 0.25 mg into the skin every 7 (seven) days., Disp: , Rfl:  .  traMADol (ULTRAM) 50 MG tablet, Take 1 tablet (50 mg total) by mouth every 6 (six) hours as needed for moderate pain. (Patient taking differently: Take 25-50 mg by mouth every 6 (six) hours as needed for moderate pain.), Disp: 20 tablet, Rfl: 0 .  albuterol (VENTOLIN HFA) 108 (90 Base) MCG/ACT inhaler, Inhale 2 puffs into the lungs every 4 (four) hours as needed for wheezing or shortness of breath. (Patient not taking: Reported on 02/06/2023), Disp: , Rfl:  .  nitroGLYCERIN (NITROSTAT) 0.4 MG SL tablet, Place 1 tablet (0.4 mg total) under the tongue every 5 (five) minutes as needed for chest pain., Disp: 25 tablet, Rfl: 2  Assessment     ICD-10-CM   1. S/P aortic valve  replacement with bioprosthetic valve  Z95.3 EKG 12-Lead    2. Palpitations  R00.2 metoprolol tartrate (LOPRESSOR) 25 MG tablet    3. Angina pectoris with normal coronary arteriogram (HCC)  I20.9 nitroGLYCERIN (NITROSTAT) 0.4 MG SL tablet     Medications Discontinued During This Encounter  Medication Reason  . cephALEXin (KEFLEX) 500 MG capsule   . amoxicillin-clavulanate (AUGMENTIN) 875-125 MG tablet   . nitroGLYCERIN (NITROSTAT) 0.4 MG SL tablet Reorder     Meds ordered this encounter  Medications  . metoprolol tartrate (LOPRESSOR) 25 MG tablet    Sig: Take 1 tablet (25 mg total) by mouth 3 (three) times daily as needed (Palpitations).    Dispense:  30 tablet    Refill:  2  . nitroGLYCERIN (NITROSTAT) 0.4 MG SL tablet    Sig: Place 1 tablet (0.4 mg total) under the tongue every 5 (five) minutes as needed for chest pain.    Dispense:  25 tablet    Refill:  2   Orders Placed This Encounter  Procedures  . EKG 12-Lead    Recommendations:   SHAELEY YAKLIN is a 67 y.o.  female with a past medical history of hypertension, severe aortic regurgitation S/P BIOPROSTHETIC AV replacement 11/24/2021, chronic thrombocytopenia, morbid obesity and OSA on CPAP, GERD, hiatal hernia, hypercholesterolemia, hypothyroidism and chronic stable angina with normal coronary arteries/minimal coronary artery disease by angiography in April 2021, orthostatic hypotension.  1. Palpitations Patient bringing up to see me as 2 days ago she woke up in the night with rapid palpitations that lasted for about 2 minutes.  She thought she may have been in atrial fibrillation.  She has had complete resolution of symptoms but she is extremely anxious regarding her cardiac issues, today's EKG demonstrated normal sinus rhythm.  Advised her that she is clearly at risk for atrial fibrillation but however as long as it is short lasting, we could continue observation and do not think she needs event monitor or any further  evaluation at this point.  Prescribed her metoprolol to be used only on a as needed basis.  Patient does have orthostatic hypotension, all of her antihypertensive medications were discontinued and since then she has not had any further dizziness  and she has not had any syncope.  Blood pressure is well-controlled.  - metoprolol tartrate (LOPRESSOR) 25 MG tablet; Take 1 tablet (25 mg total) by mouth 3 (three) times daily as needed (Palpitations).  Dispense: 30 tablet; Refill: 2  2. S/P aortic valve replacement with bioprosthetic valve Aortic valve is functioning normally.  She is aware of endocarditis prophylaxis. - EKG 12-Lead  3. Angina pectoris with normal coronary arteriogram (HCC) I refilled the prescription for nitroglycerin.  Fortunately she has not had any recent anginal symptoms although she has had musculoskeletal chest pain.  - nitroGLYCERIN (NITROSTAT) 0.4 MG SL tablet; Place 1 tablet (0.4 mg total) under the tongue every 5 (five) minutes as needed for chest pain.  Dispense: 25 tablet; Refill: 2    Yates Decamp, MD, [facility] 02/06/2023, 12:06 PM Office: [phone] Fax: [phone] Pager: [phone]

## 2023-02-20 DIAGNOSIS — J180 Bronchopneumonia, unspecified organism: Secondary | ICD-10-CM | POA: Diagnosis not present

## 2023-03-30 ENCOUNTER — Other Ambulatory Visit (HOSPITAL_BASED_OUTPATIENT_CLINIC_OR_DEPARTMENT_OTHER): Payer: Self-pay | Admitting: Family Medicine

## 2023-03-30 ENCOUNTER — Ambulatory Visit (HOSPITAL_BASED_OUTPATIENT_CLINIC_OR_DEPARTMENT_OTHER)
Admission: RE | Admit: 2023-03-30 | Discharge: 2023-03-30 | Disposition: A | Discharge status: Home / Self Care | Payer: PPO | Source: Ambulatory Visit | Attending: Family Medicine | Admitting: Family Medicine

## 2023-03-30 DIAGNOSIS — R109 Unspecified abdominal pain: Secondary | ICD-10-CM | POA: Insufficient documentation

## 2023-03-30 DIAGNOSIS — Z90710 Acquired absence of both cervix and uterus: Secondary | ICD-10-CM | POA: Diagnosis not present

## 2023-03-30 DIAGNOSIS — Z6841 Body Mass Index (BMI) 40.0 and over, adult: Secondary | ICD-10-CM | POA: Diagnosis not present

## 2023-03-30 DIAGNOSIS — R319 Hematuria, unspecified: Secondary | ICD-10-CM | POA: Diagnosis not present

## 2023-04-06 ENCOUNTER — Other Ambulatory Visit (HOSPITAL_COMMUNITY): Payer: Self-pay | Admitting: Family Medicine

## 2023-04-06 DIAGNOSIS — R109 Unspecified abdominal pain: Secondary | ICD-10-CM

## 2023-04-08 ENCOUNTER — Ambulatory Visit (HOSPITAL_BASED_OUTPATIENT_CLINIC_OR_DEPARTMENT_OTHER)
Admission: RE | Admit: 2023-04-08 | Discharge: 2023-04-08 | Disposition: A | Discharge status: Home / Self Care | Payer: PPO | Source: Ambulatory Visit | Attending: Family Medicine | Admitting: Family Medicine

## 2023-04-08 DIAGNOSIS — N281 Cyst of kidney, acquired: Secondary | ICD-10-CM | POA: Diagnosis not present

## 2023-04-08 DIAGNOSIS — R109 Unspecified abdominal pain: Secondary | ICD-10-CM | POA: Insufficient documentation

## 2023-04-08 DIAGNOSIS — K8689 Other specified diseases of pancreas: Secondary | ICD-10-CM | POA: Diagnosis not present

## 2023-04-08 LAB — POCT I-STAT CREATININE: Creatinine, Ser: 1.1 mg/dL — ABNORMAL HIGH (ref 0.44–1.00)

## 2023-04-08 MED ORDER — IOHEXOL 300 MG/ML  SOLN
100.0000 mL | Freq: Once | INTRAMUSCULAR | Status: AC | PRN
  Administered 2023-04-08: 100 mL via INTRAVENOUS

## 2023-04-10 DIAGNOSIS — M8580 Other specified disorders of bone density and structure, unspecified site: Secondary | ICD-10-CM | POA: Diagnosis not present

## 2023-04-10 DIAGNOSIS — Z6841 Body Mass Index (BMI) 40.0 and over, adult: Secondary | ICD-10-CM | POA: Diagnosis not present

## 2023-04-10 DIAGNOSIS — K7460 Unspecified cirrhosis of liver: Secondary | ICD-10-CM | POA: Diagnosis not present

## 2023-04-10 DIAGNOSIS — Z0000 Encounter for general adult medical examination without abnormal findings: Secondary | ICD-10-CM | POA: Diagnosis not present

## 2023-04-10 DIAGNOSIS — Z1339 Encounter for screening examination for other mental health and behavioral disorders: Secondary | ICD-10-CM | POA: Diagnosis not present

## 2023-04-10 DIAGNOSIS — Z1331 Encounter for screening for depression: Secondary | ICD-10-CM | POA: Diagnosis not present

## 2023-04-16 ENCOUNTER — Telehealth: Payer: Self-pay | Admitting: Internal Medicine

## 2023-04-16 NOTE — Telephone Encounter (Signed)
Inbound call from patient stating her pcp referred her due to abnormal liver labs. Advised patient the next available is 07/23/23. Patient is requesting a call to discuss if anything could be done sooner. Please advise, thank you.

## 2023-04-16 NOTE — Telephone Encounter (Signed)
Advised patient of appointment. Patient confirmed. Please advise, thank you

## 2023-04-16 NOTE — Telephone Encounter (Signed)
There was a cancellation on Joanna Alvarado's schedule for tomorrow. Pt scheduled to see [name] PA 04/17/23 at 9am. Please let her know about the appt.

## 2023-04-17 ENCOUNTER — Encounter: Payer: Self-pay | Admitting: Physician Assistant

## 2023-04-17 ENCOUNTER — Telehealth: Payer: Self-pay | Admitting: Oncology

## 2023-04-17 ENCOUNTER — Other Ambulatory Visit (INDEPENDENT_AMBULATORY_CARE_PROVIDER_SITE_OTHER): Payer: PPO

## 2023-04-17 ENCOUNTER — Ambulatory Visit: Payer: PPO | Admitting: Physician Assistant

## 2023-04-17 VITALS — BP 120/60 | Ht 68.0 in | Wt 306.0 lb

## 2023-04-17 DIAGNOSIS — K5904 Chronic idiopathic constipation: Secondary | ICD-10-CM

## 2023-04-17 DIAGNOSIS — D696 Thrombocytopenia, unspecified: Secondary | ICD-10-CM

## 2023-04-17 DIAGNOSIS — R7989 Other specified abnormal findings of blood chemistry: Secondary | ICD-10-CM

## 2023-04-17 DIAGNOSIS — E538 Deficiency of other specified B group vitamins: Secondary | ICD-10-CM | POA: Diagnosis not present

## 2023-04-17 DIAGNOSIS — I868 Varicose veins of other specified sites: Secondary | ICD-10-CM | POA: Diagnosis not present

## 2023-04-17 DIAGNOSIS — N1831 Chronic kidney disease, stage 3a: Secondary | ICD-10-CM | POA: Diagnosis not present

## 2023-04-17 DIAGNOSIS — Z953 Presence of xenogenic heart valve: Secondary | ICD-10-CM | POA: Diagnosis not present

## 2023-04-17 DIAGNOSIS — K766 Portal hypertension: Secondary | ICD-10-CM | POA: Diagnosis not present

## 2023-04-17 DIAGNOSIS — E1122 Type 2 diabetes mellitus with diabetic chronic kidney disease: Secondary | ICD-10-CM | POA: Diagnosis not present

## 2023-04-17 DIAGNOSIS — T884XXD Failed or difficult intubation, subsequent encounter: Secondary | ICD-10-CM

## 2023-04-17 LAB — VITAMIN B12: Vitamin B-12: 711 pg/mL (ref 211–911)

## 2023-04-17 LAB — IBC + FERRITIN
Ferritin: 110.3 ng/mL (ref 10.0–291.0)
Iron: 144 ug/dL (ref 42–145)
Saturation Ratios: 51.2 % — ABNORMAL HIGH (ref 20.0–50.0)
TIBC: 281.4 ug/dL (ref 250.0–450.0)
Transferrin: 201 mg/dL — ABNORMAL LOW (ref 212.0–360.0)

## 2023-04-17 LAB — CBC WITH DIFFERENTIAL/PLATELET
Basophils Absolute: 0.1 10*3/uL (ref 0.0–0.1)
Basophils Relative: 2.2 % (ref 0.0–3.0)
Eosinophils Absolute: 0.5 10*3/uL (ref 0.0–0.7)
Eosinophils Relative: 10.9 % — ABNORMAL HIGH (ref 0.0–5.0)
HCT: 36.8 % (ref 36.0–46.0)
Hemoglobin: 12.6 g/dL (ref 12.0–15.0)
Lymphocytes Relative: 33.8 % (ref 12.0–46.0)
Lymphs Abs: 1.6 10*3/uL (ref 0.7–4.0)
MCHC: 34.2 g/dL (ref 30.0–36.0)
MCV: 96.7 fL (ref 78.0–100.0)
Monocytes Absolute: 0.3 10*3/uL (ref 0.1–1.0)
Monocytes Relative: 7.3 % (ref 3.0–12.0)
Neutro Abs: 2.1 10*3/uL (ref 1.4–7.7)
Neutrophils Relative %: 45.8 % (ref 43.0–77.0)
Platelets: 68 10*3/uL — ABNORMAL LOW (ref 150.0–400.0)
RBC: 3.8 Mil/uL — ABNORMAL LOW (ref 3.87–5.11)
RDW: 14.9 % (ref 11.5–15.5)
WBC: 4.6 10*3/uL (ref 4.0–10.5)

## 2023-04-17 LAB — COMPREHENSIVE METABOLIC PANEL
ALT: 20 U/L (ref 0–35)
AST: 43 U/L — ABNORMAL HIGH (ref 0–37)
Albumin: 3 g/dL — ABNORMAL LOW (ref 3.5–5.2)
Alkaline Phosphatase: 109 U/L (ref 39–117)
BUN: 12 mg/dL (ref 6–23)
CO2: 24 meq/L (ref 19–32)
Calcium: 9.3 mg/dL (ref 8.4–10.5)
Chloride: 110 meq/L (ref 96–112)
Creatinine, Ser: 0.78 mg/dL (ref 0.40–1.20)
GFR: 78.02 mL/min (ref >60.00)
Glucose, Bld: 83 mg/dL (ref 70–99)
Potassium: 3.3 meq/L — ABNORMAL LOW (ref 3.5–5.1)
Sodium: 140 meq/L (ref 135–145)
Total Bilirubin: 1.6 mg/dL — ABNORMAL HIGH (ref 0.2–1.2)
Total Protein: 5 g/dL — ABNORMAL LOW (ref 6.0–8.3)

## 2023-04-17 LAB — PROTIME-INR
INR: 1.3 {ratio} — ABNORMAL HIGH (ref 0.8–1.0)
Prothrombin Time: 13.3 s — ABNORMAL HIGH (ref 9.6–13.1)

## 2023-04-17 LAB — AMMONIA: Ammonia: 24 umol/L (ref 11–35)

## 2023-04-17 MED ORDER — LACTULOSE 10 GM/15ML PO SOLN: ORAL | 1 refills | Status: DC

## 2023-04-17 NOTE — Telephone Encounter (Signed)
04/17/23 Patient cancelled lab appt.Lab drawn at [facility].

## 2023-04-17 NOTE — Patient Instructions (Addendum)
Your provider has requested that you go to the basement level for lab work before leaving today. Press "B" on the elevator. The lab is located at the first door on the left as you exit the elevator.  You Endoscopy has been scheduled at [facility] on 05/16/2023 at 10 am, Separate instructions have been given  Can try the lactulose for constipation Start out with 10mg  or 15 ml once a day, can go up to 30 ml twice a day as needed for constipation Please also make sure you are drinking a lot of water, up to 64-80 oz a day Try to increase your fiber such as whole graines and veggies You can also add a fiber supplement like Citracel or Benefiber, these do not cause gas and bloating and are safe to use.   Walking or moving 20 mins a day can helps as well Continue linzess every other day or every two days  If you have severe AB pain, no BM for 3-4 days, or blood in your stool let us know.   You should have an imaging study every 6 months to monitor for the development of hepatocellular carcinoma (liver cancer). The risk is low, but, if liver cancer is diagnosed early, there are better treatment options. Will get AFP, INR, CBC, and CMET.  Will follow up in 6 months to check labs and evaluate.   I recommend a high-protein, primarily plant-based diet. Avoid red meat. Work to maintain a health weight. Weigh yourself daily- call if you have weight gain of greater than 5 lbs in a 1-2 days, leg swelling, or new swelling in your abdomen. Minimize salt intake- VERY important. Please do not consume more than 2000 mg of sodium every day. Monitor your blood pressure at home.  Stay active. Weight-based exercise for 30 minutes at least 3 days a week is recommended. I recommend that you not drink any alcohol including beer, wine, liquor, and non-alcoholic beer.   You are at increased risk of osteopenia and osteoporosis. You should be screened for these metabolic bone diseases if you have not already had the testing  performed.  Cirrhosis Cirrhosis is long-term (chronic) liver injury. The liver is the body's largest internal organ, and it performs many functions. It converts food into energy, removes toxic material from the blood, makes important proteins, and absorbs necessary vitamins from food. In cirrhosis, healthy liver cells are replaced by scar tissue. This prevents blood from flowing through the liver and makes it difficult for the liver to complete its functions. What are the causes? Common causes of this condition are hepatitis C and long-term alcohol abuse. Other causes include: Nonalcoholic fatty liver disease (NAFLD). This happens when fat is deposited in the liver by causes other than alcohol. Hepatitis B infection. Autoimmune hepatitis. In this condition, the body's defense system (immune system) mistakenly attacks the liver cells, causing inflammation. Diseases that cause blockage of ducts inside the liver. Inherited liver diseases, such as hemochromatosis. This is one of the most common inherited liver diseases. In this disease, deposits of iron collect in the liver and other organs. Reactions to certain long-term medicines, such as amiodarone, a heart medicine. Parasitic infections. These include schistosomiasis, which is caused by a flatworm. Long-term contact to certain toxins. These toxins include certain organic solvents, such as toluene and chloroform. What increases the risk? You are more likely to develop this condition if: You have certain types of viral hepatitis. You abuse alcohol, especially if you are female. You are overweight. You  use IV drugs and share needles. You have unprotected sex with someone who has viral hepatitis. What are the signs or symptoms? You may not have any signs and symptoms at first. Symptoms may not develop until the damage to your liver starts to get worse. Early symptoms may include: Weakness and tiredness (fatigue). Changes in sleep patterns or  having trouble sleeping. Itchiness. Tenderness in the right-upper part of your abdomen. Weight loss and muscle loss. Nausea. Loss of appetite. Later symptoms may include: Fatigue or weakness that is getting worse. Yellow skin and eyes (jaundice). Buildup of fluid in the abdomen (ascites). You may notice that your clothes are tight around your waist. Weight gain and swelling of the feet and ankles (edema). Trouble breathing. Easy bruising and bleeding. Vomiting blood, or black or bloody stool. Mental confusion. How is this diagnosed? Your health care provider may suspect cirrhosis based on your symptoms and medical history, especially if you have other medical conditions or a history of alcohol abuse. Your health care provider will do a physical exam to feel your liver and to check for signs of cirrhosis. Tests may include: Blood tests to check: For hepatitis B or C. Kidney function. Liver function. Imaging tests such as: MRI or CT scan to look for changes seen in advanced cirrhosis. Ultrasound to see if normal liver tissue is being replaced by scar tissue. A procedure in which a long needle is used to take a sample of liver tissue to be checked in a lab (biopsy). Liver biopsy can confirm the diagnosis of cirrhosis. How is this treated? Treatment for this condition depends on how damaged your liver is and what caused the damage. It may include treating the symptoms of cirrhosis, or treating the underlying causes to slow the damage. Treatment may include: Making lifestyle changes, such as: Eating a healthy diet. You may need to work with your health care provider or a dietitian to develop an eating plan. Restricting salt intake. Maintaining a healthy weight. Not abusing drugs or alcohol. Taking medicines to: Treat liver infections or other infections. Control itching. Reduce fluid buildup. Reduce certain blood toxins. Reduce risk of bleeding from enlarged blood vessels in the  stomach or esophagus (varices). Liver transplant. In this procedure, a liver from a donor is used to replace your diseased liver. This is done if cirrhosis has caused liver failure. Other treatments and procedures may be done depending on the problems that you get from cirrhosis. Common problems include liver-related kidney failure (hepatorenal syndrome). Follow these instructions at home:  Take medicines only as told by your health care provider. Do not use medicines that are toxic to your liver. Ask your health care provider before taking any new medicines, including over-the-counter medicines such as NSAIDs. Rest as needed. Eat a well-balanced diet. Limit your salt or water intake, if your health care provider asks you to do this. Do not drink alcohol. This is especially important if you routinely take acetaminophen. Keep all follow-up visits. This is important. Contact a health care provider if you: Have fatigue or weakness that is getting worse. Develop swelling of the hands, feet, or legs, or a buildup of fluid in the abdomen (ascites). Have a fever or chills. Develop loss of appetite. Have nausea or vomiting. Develop jaundice. Develop easy bruising or bleeding. Get help right away if you: Vomit bright red blood or a material that looks like coffee grounds. Have blood in your stools. Notice that your stools appear black and tarry. Become confused. Have  chest pain or trouble breathing. These symptoms may represent a serious problem that is an emergency. Do not wait to see if the symptoms will go away. Get medical help right away. Call your local emergency services (911 in the [place] Do not drive yourself to the hospital. Summary Cirrhosis is chronic liver injury. Common causes are hepatitis C and long-term alcohol abuse. Tests used to diagnose cirrhosis include blood tests, imaging tests, and liver biopsy. Treatment for this condition involves treating the underlying cause. Avoid  alcohol, drugs, salt, and medicines that may damage your liver. Get help right away if you vomit bright red blood or a material that looks like coffee grounds. This information is not intended to replace advice given to you by your health care provider. Make sure you discuss any questions you have with your health care provider.  Toileting tips to help with your constipation - Drink at least 64-80 ounces of water/liquid per day. - Establish a time to try to move your bowels every day.  For many people, this is after a cup of coffee or after a meal such as breakfast. - Sit all of the way back on the toilet keeping your back fairly straight and while sitting up, try to rest the tops of your forearms on your upper thighs.   - Raising your feet with a step stool/squatty potty can be helpful to improve the angle that allows your stool to pass through the rectum. - Relax the rectum feeling it bulge toward the toilet water.  If you feel your rectum raising toward your body, you are contracting rather than relaxing. - Breathe in and slowly exhale. "Belly breath" by expanding your belly towards your belly button. Keep belly expanded as you gently direct pressure down and back to the anus.  A low pitched GRRR sound can assist with increasing intra-abdominal pressure.  (Can also trying to blow on a pinwheel and make it move, this helps with the same belly breathing) - Repeat 3-4 times. If unsuccessful, contract the pelvic floor to restore normal tone and get off the toilet.  Avoid excessive straining. - To reduce excessive wiping by teaching your anus to normally contract, place hands on outer aspect of knees and resist knee movement outward.  Hold 5-10 second then place hands just inside of knees and resist inward movement of knees.  Hold 5 seconds.  Repeat a few times each way.  Go to the ER if unable to pass gas, severe AB pain, unable to hold down food, any shortness of breath of chest pain. Low-Sodium  Eating Plan Salt (sodium) helps you keep a healthy balance of fluids in your body. Too much sodium can raise your blood pressure. It can also cause fluid and waste to be held in your body. Your health care provider or dietitian may recommend a low-sodium eating plan if you have high blood pressure (hypertension), kidney disease, liver disease, or heart failure. Eating less sodium can help lower your blood pressure and reduce swelling. It can also protect your heart, liver, and kidneys. What are tips for following this plan? Reading food labels  Check food labels for the amount of sodium per serving. If you eat more than one serving, you must multiply the listed amount by the number of servings. Choose foods with less than 140 milligrams (mg) of sodium per serving. Avoid foods with 300 mg of sodium or more per serving. Always check how much sodium is in a product, even if the label says "  unsalted" or "no salt added." Shopping  Buy products labeled as "low-sodium" or "no salt added." Buy fresh foods. Avoid canned foods and pre-made or frozen meals. Avoid canned, cured, or processed meats. Buy breads that have less than 80 mg of sodium per slice. Cooking  Eat more home-cooked food. Try to eat less restaurant, buffet, and fast food. Try not to add salt when you cook. Use salt-free seasonings or herbs instead of table salt or sea salt. Check with your provider or pharmacist before using salt substitutes. Cook with plant-based oils, such as canola, sunflower, or olive oil. Meal planning When eating at a restaurant, ask if your food can be made with less salt or no salt. Avoid dishes labeled as brined, pickled, cured, or smoked. Avoid dishes made with soy sauce, miso, or teriyaki sauce. Avoid foods that have monosodium glutamate (MSG) in them. MSG may be added to some restaurant food, sauces, soups, bouillon, and canned foods. Make meals that can be grilled, baked, poached, roasted, or steamed.  These are often made with less sodium. General information Try to limit your sodium intake to 1,500-2,300 mg each day, or the amount told by your provider. What foods should I eat? Fruits Fresh, frozen, or canned fruit. Fruit juice. Vegetables Fresh or frozen vegetables. "No salt added" canned vegetables. "No salt added" tomato sauce and paste. Low-sodium or reduced-sodium tomato and vegetable juice. Grains Low-sodium cereals, such as oats, puffed wheat and rice, and shredded wheat. Low-sodium crackers. Unsalted rice. Unsalted pasta. Low-sodium bread. Whole grain breads and whole grain pasta. Meats and other proteins Fresh or frozen meat, poultry, seafood, and fish. These should have no added salt. Low-sodium canned tuna and salmon. Unsalted nuts. Dried peas, beans, and lentils without added salt. Unsalted canned beans. Eggs. Unsalted nut butters. Dairy Milk. Soy milk. Cheese that is naturally low in sodium, such as ricotta cheese, fresh mozzarella, or Swiss cheese. Low-sodium or reduced-sodium cheese. Cream cheese. Yogurt. Seasonings and condiments Fresh and dried herbs and spices. Salt-free seasonings. Low-sodium mustard and ketchup. Sodium-free salad dressing. Sodium-free light mayonnaise. Fresh or refrigerated horseradish. Lemon juice. Vinegar. Other foods Homemade, reduced-sodium, or low-sodium soups. Unsalted popcorn and pretzels. Low-salt or salt-free chips. The items listed above may not be all the foods and drinks you can have. Talk to a dietitian to learn more. What foods should I avoid? Vegetables Sauerkraut, pickled vegetables, and relishes. Olives. [place] fries. Onion rings. Regular canned vegetables, except low-sodium or reduced-sodium items. Regular canned tomato sauce and paste. Regular tomato and vegetable juice. Frozen vegetables in sauces. Grains Instant hot cereals. Bread stuffing, pancake, and biscuit mixes. Croutons. Seasoned rice or pasta mixes. Noodle soup cups. Boxed  or frozen macaroni and cheese. Regular salted crackers. Self-rising flour. Meats and other proteins Meat or fish that is salted, canned, smoked, spiced, or pickled. Precooked or cured meat, such as sausages or meat loaves. Tomasa Blase. Ham. Pepperoni. Hot dogs. Corned beef. Chipped beef. Salt pork. Jerky. Pickled herring, anchovies, and sardines. Regular canned tuna. Salted nuts. Dairy Processed cheese and cheese spreads. Hard cheeses. Cheese curds. Blue cheese. Feta cheese. String cheese. Regular cottage cheese. Buttermilk. Canned milk. Fats and oils Salted butter. Regular margarine. Ghee. Bacon fat. Seasonings and condiments Onion salt, garlic salt, seasoned salt, table salt, and sea salt. Canned and packaged gravies. Worcestershire sauce. Tartar sauce. Barbecue sauce. Teriyaki sauce. Soy sauce, including reduced-sodium soy sauce. Steak sauce. Fish sauce. Oyster sauce. Cocktail sauce. Horseradish that you find on the shelf. Regular ketchup and mustard. Meat  flavorings and tenderizers. Bouillon cubes. Hot sauce. Pre-made or packaged marinades. Pre-made or packaged taco seasonings. Relishes. Regular salad dressings. Salsa. Other foods Salted popcorn and pretzels. Corn chips and puffs. Potato and tortilla chips. Canned or dried soups. Pizza. Frozen entrees and pot pies. The items listed above may not be all the foods and drinks you should avoid. Talk to a dietitian to learn more. This information is not intended to replace advice given to you by your health care provider. Make sure you discuss any questions you have with your health care provider. Document Revised: 06/08/2022 Document Reviewed: 06/08/2022 Elsevier Patient Education  2024 [employer]  Document Revised: 03/04/2020 Document Reviewed: 03/04/2020 Elsevier Patient Education  2022 [employer].

## 2023-04-17 NOTE — Progress Notes (Signed)
04/17/2023 Joanna Alvarado [id number] Oct 19, 1955  Referring provider: Marylen Ponto, MD Primary GI doctor: Dr. Marina Goodell  ASSESSMENT AND PLAN:     Likely cirrhosis secondary to [facility] by history and physical MELD 3.0: 13 on 11/29/2022  Elevated liver function tests, possible morphologic changes of cirrhosis on CT, and multiple large left abdominal varices with probable splenorenal shunt enlarged from 2020, suggesting portal hypertension. No history of alcohol or drug use. No hepatocellular workup done previously. - Labs to exclude other causes of cirrhossis: hepatitis C antibody, hepatitis B surface antigen, iron, ferritin, total iron binding capacity, IgG, ANA, celiac -Will add on lactulose for history of chronic constipation can continue Linzess as needed -Will check PT/INR, ammonia level and calculate MELD -Discussed low-sodium diet 2 g daily or less, discussed protein snack at night, discussed monitoring weight and for edema.  Constipation Chronic constipation, not fully relieved by Linzess, Miralax, or stool softeners. Patient has difficulty affording Linzess. -Provide Linzess samples, 290 mcg -Add on lactulose twice daily as needed for constipation can also be helpful for ammonia levels though no evidence of HE on exam  Portal hypertension with abdominal varices some reflux and history of IDA Recent normal colonoscopy Will plan for EGD for variceal screening in the hospital with Dr. Marina Goodell, patient is difficult intubation I discussed risks of EGD with patient today, including risk of sedation, bleeding or perforation.  Patient provides understanding and gave verbal consent to proceed.  Cardiovascular Health History of hypertension, CAD, heart failure, aortic regurgitation moderate. Bioprosthetic aortic valve. Ejection fraction 60-65% on 4/10/124. -Continue follow-up with cardiology  Colon Polyps History of multiple adenomatous polyps. Colonoscopy on 7/20 showed two polyps.  Follow-up in 5 years. -Continue current management.  Thrombocytopenia likely secondary to cirrhosis Chronic low platelets (61,000). -Continue monitoring, may need to recheck prior to hospitalization for EGD  Follow-up 6 months for evaluation   Patient Care Team: Marylen Ponto, MD as PCP - General (Family Medicine) Yates Decamp, MD as Consulting Physician (Cardiology) Weston Settle, MD as Consulting Physician (Oncology)  HISTORY OF PRESENT ILLNESS: Discussed the use of AI scribe software for clinical note transcription with the patient, who gave verbal consent to proceed.  [name] with a complex medical history including hypertension, coronary artery disease, heart failure, moderate aortic regurgitation, sleep apnea, gastroesophageal reflux disease, type 2 diabetes, hyperthyroidism, stage 3B chronic kidney disease, thrombocytopenia, iron deficiency anemia, and a history of colon polyps, presents with elevated liver function.   The patient has a history of multiple adenomatous polyps, with the most recent colonoscopy revealing two polyps. The patient's most recent echocardiogram showed an ejection fraction of 60-65% and a bioprosthetic aortic valve.  The patient's most recent imaging of the abdomen showed  possible mild morphologic changes of cirrhosis no lesions, and multiple large left abdominal varices with probable splenorenal shunt enlarged from the previous year, suggesting portal hypertension. The patient's most recent liver labs showed an INR of 1.4, potassium of 3.2, albumin of 2.9, AST of 48, ALKPAS of 128, and total bilirubin of 1.4. The patient's CBC showed platelets of 61, with no anemia.  The patient has been experiencing constipation, with an episode of not having a bowel movement for 14 days. The patient has been taking Linzess, but not daily due to cost, and has been supplementing with a stool softener and Miralax. The patient has also noticed yellowing of the  eyes, specifically the left eye, and has had yellow pus discharge from the  same eye that has resolved.  Patient has swelling bilateral legs worse if her legs hanging for 2 hours better at night, denies abdominal swelling. Has had some generalized pruritus for the last several months no rashes. Denies any confusion or slow mentation. The patient denies any history of alcohol or drug use.  Denies family history of liver issues.   She  reports that she has never smoked. She has never used smokeless tobacco. She reports that she does not drink alcohol and does not use drugs.  RELEVANT LABS AND IMAGING:  Results   LABS INR: 1.4 (11/29/2022) Potassium: 3.2 (11/29/2022) Albumin: 2.9 (11/29/2022) AST: 48 (11/29/2022) ALP: 128 (11/29/2022) Total bilirubin: 1.4 (11/29/2022) CBC with platelets: PLT 61 (11/29/2022) Iron: 146 (10/16/2022) Ferritin: 46 (10/16/2022)  RADIOLOGY CT abdomen pelvis with contrast: Normal density of liver without suspicious focal abnormality, subcapsular liver lesions appear chronic unchanged, possible mild morphologic changes of cirrhosis, no evidence of biliary dilation, status post cholecystitis, mild atrophy of the pancreas without ductal dilation, normal spleen, unremarkable stomach and bowel without inflammatory changes or obstruction, multiple large left abdominal varices with probable splenorenal shunt enlarged from 2020, suggesting portal hypertension (04/08/2023)  DIAGNOSTIC Colonoscopy: Three polyps, 2-4 mm, ascending in cecum, diverticulosis, left colon (12/23/2022) Echocardiogram: Ejection fraction 60-65%, bioprosthetic aortic valve (09/13/2022)      CBC    Component Value Date/Time   WBC 3.8 (L) 11/29/2022 1054   RBC 4.29 11/29/2022 1054   HGB 13.2 11/29/2022 1054   HGB 11.1 03/13/2022 1532   HCT 39.3 11/29/2022 1054   HCT 34.2 03/13/2022 1532   PLT 61 (L) 11/29/2022 1054   PLT 87 (LL) 03/13/2022 1532   MCV 91.6 11/29/2022 1054   MCV 84  03/13/2022 1532   MCV 94 03/15/2021 0000   MCH 30.8 11/29/2022 1054   MCHC 33.6 11/29/2022 1054   RDW 13.9 11/29/2022 1054   RDW 14.8 03/13/2022 1532   LYMPHSABS 2.2 08/15/2018 1734   MONOABS 0.8 08/15/2018 1734   EOSABS 0.2 08/15/2018 1734   BASOSABS 0.1 08/15/2018 1734   Recent Labs    10/16/22 0000 11/29/22 1054  HGB 14.2 13.2    CMP     Component Value Date/Time   NA 139 11/29/2022 1054   NA 146 (H) 04/10/2022 1500   K 3.2 (L) 11/29/2022 1054   CL 111 11/29/2022 1054   CO2 20 (L) 11/29/2022 1054   GLUCOSE 97 11/29/2022 1054   BUN 10 11/29/2022 1054   BUN 16 04/10/2022 1500   CREATININE 1.10 (H) 04/08/2023 1144   CREATININE 0.91 10/16/2022 1006   CALCIUM 9.5 11/29/2022 1054   PROT 4.9 (L) 11/29/2022 1054   PROT 5.4 (L) 01/03/2022 1206   ALBUMIN 2.9 (L) 11/29/2022 1054   ALBUMIN 3.4 (L) 01/03/2022 1206   AST 48 (H) 11/29/2022 1054   AST 41 10/16/2022 1006   ALT 29 11/29/2022 1054   ALT 20 10/16/2022 1006   ALKPHOS 128 (H) 11/29/2022 1054   BILITOT 1.4 (H) 11/29/2022 1054   BILITOT 1.4 (H) 10/16/2022 1006   GFRNONAA >60 11/29/2022 1054   GFRNONAA >60 10/16/2022 1006   GFRAA 56 (L) 09/30/2019 1041      Latest Ref Rng & Units 11/29/2022   10:54 AM 10/16/2022   10:06 AM 04/17/2022   10:16 AM  Hepatic Function  Total Protein 6.5 - 8.1 g/dL 4.9  5.7  5.9   Albumin 3.5 - 5.0 g/dL 2.9  3.2  3.2   AST 15 - 41 U/L  48  41  34   ALT 0 - 44 U/L 29  20  17    Alk Phosphatase 38 - 126 U/L 128  100  109   Total Bilirubin 0.3 - 1.2 mg/dL 1.4  1.4  1.0       Current Medications:   Current Outpatient Medications (Endocrine & Metabolic):    fludrocortisone (FLORINEF) 0.1 MG tablet, Take 1 tablet (0.1 mg total) by mouth every evening. (Patient taking differently: Take 0.1 mg by mouth daily.)   levothyroxine (SYNTHROID) 150 MCG tablet, Take 150 mcg by mouth daily before breakfast.  Current Outpatient Medications (Cardiovascular):    metoprolol tartrate (LOPRESSOR) 25 MG  tablet, Take 1 tablet (25 mg total) by mouth 3 (three) times daily as needed (Palpitations).   nitroGLYCERIN (NITROSTAT) 0.4 MG SL tablet, Place 1 tablet (0.4 mg total) under the tongue every 5 (five) minutes as needed for chest pain.   rosuvastatin (CRESTOR) 5 MG tablet, TAKE ONE TABLET BY MOUTH DAILY  Current Outpatient Medications (Respiratory):    albuterol (VENTOLIN HFA) 108 (90 Base) MCG/ACT inhaler, Inhale 2 puffs into the lungs every 4 (four) hours as needed for wheezing or shortness of breath.  Current Outpatient Medications (Analgesics):    acetaminophen (TYLENOL) 500 MG tablet, Take 1,000 mg by mouth every 6 (six) hours as needed for moderate pain.   traMADol (ULTRAM) 50 MG tablet, Take 1 tablet (50 mg total) by mouth every 6 (six) hours as needed for moderate pain. (Patient taking differently: Take 25-50 mg by mouth every 6 (six) hours as needed for moderate pain (pain score 4-6).)   Current Outpatient Medications (Other):    ergocalciferol (VITAMIN D2) 1.25 MG (50000 UT) capsule, Take 50,000 Units by mouth every Tuesday.   lactulose (CHRONULAC) 10 GM/15ML solution, Take 15-30 ml up to twice daily for constipation   lidocaine (LIDODERM) 5 %, Place 1 patch onto the skin daily as needed (Knee pain). Remove & Discard patch within 12 hours or as directed by MD   linaclotide (LINZESS) 290 MCG CAPS capsule, Take 1 capsule (290 mcg total) by mouth daily before breakfast.   omeprazole (PRILOSEC) 40 MG capsule, Take 40 mg by mouth daily.   OVER THE COUNTER MEDICATION, Take 2 tablets by mouth in the morning. [facility]   polyethylene glycol (MIRALAX / GLYCOLAX) 17 g packet, Take 17 g by mouth daily as needed for mild constipation or moderate constipation.  Medical History:  Past Medical History:  Diagnosis Date   Abnormal myocardial perfusion study 09/26/2019   Anemia    Angina pectoris (HCC) - Class II-III 09/26/2019   Aortic valve insufficiency    Arthritis    KNEES    Benign neoplasm of ascending colon    Benign neoplasm of cecum    Benign neoplasm of descending colon    Benign neoplasm of sigmoid colon    Benign neoplasm of transverse colon    Bowel habit changes    Cataract    BILATERAL-REMOVED   CHF (congestive heart failure) (HCC)    Chronic postoperative pain 12/02/2012   Complication of anesthesia    Coronary artery disease    Diabetes mellitus without complication (HCC)    Difficult intubation    Disturbance of skin sensation 12/02/2012   Fibromyalgia    GERD (gastroesophageal reflux disease)    Head injury    scalp injury x2 required sutures   Headache    prior to hyster   Heart murmur    History of  blood transfusion    History of colonic polyps    History of hiatal hernia    History of kidney stones    History of transfusion of platelets 11/2021   Hypertension    Hypothyroidism    Idiopathic thrombocytopenic purpura (ITP) (HCC)    Myalgia and myositis, unspecified 12/02/2012   Personal history of colonic polyps    Pneumonia    Primary hypothyroidism 11/30/2015   Senile nuclear sclerosis 04/24/2014   Sleep apnea    Thyroid disease    Thyroid nodule 11/30/2015   Type 2 diabetes mellitus without complications (HCC) 05/27/2018   11/17/22- A1C 4.9   Vitamin D deficiency 11/30/2015   Allergies:  Allergies  Allergen Reactions   Adhesive [Tape]     Latex, band aids  causes whelts and blisters   Dermabond causes, welts   Psyllium Other (See Comments)    Severe constipation   Trulicity [Dulaglutide]     KIDNEY INFECTION,INABILITY TO HAVE BOWEL MOVEMENTS,BLOOD IN URINE   Hydrocodone-Acetaminophen Itching    Tolerates small/ infrequent doses   Latex Other (See Comments)    Band-aids causes whelts and blisters   Oxycontin [Oxycodone Hcl] Other (See Comments)    "FELT LIKE HEAD WAS GOING TO EXPLODE" in head   Ciprofloxacin Rash    Yeast infection and a severe rash    Codeine Itching and Rash   Other Palpitations     Steroids      "makes me feel like I'm having a heart attack. -     Surgical History:  She  has a past surgical history that includes Tonsillectomy; Appendectomy; Abdominal hysterectomy; Cholecystectomy; left foot surgery; Eye surgery; floater bone surgery; Plantar fascia surgery; Colonoscopy (N/A, 07/27/2015); Foot surgery; Colonoscopy with propofol (N/A, 04/28/2019); polypectomy (04/28/2019); LEFT HEART CATH AND CORONARY ANGIOGRAPHY (N/A, 09/26/2019); Foot surgery (Left); RIGHT HEART CATH (N/A, 02/15/2021); RIGHT/LEFT HEART CATH AND CORONARY ANGIOGRAPHY (N/A, 11/08/2021); Aortic valve replacement (N/A, 11/24/2021); TEE without cardioversion (N/A, 11/24/2021); Fracture surgery; Sternal wires removal (N/A, 11/29/2022); Colonoscopy with propofol (N/A, 01/01/2023); and polypectomy (01/01/2023). Family History:  Her family history includes Colon polyps in her brother; Diabetes in her mother; Heart disease in her paternal grandmother; Heart disease (age of onset: 81) in her mother; Hypertension in her father and mother; Stroke in her maternal grandmother; Throat cancer in her paternal grandfather.  REVIEW OF SYSTEMS  : All other systems reviewed and negative except where noted in the History of Present Illness.  PHYSICAL EXAM: BP 120/60   Ht 5\' 8"  (1.727 m)   Wt (!) 306 lb (138.8 kg)   BMI 46.53 kg/m  General :  Alert, well developed female in no acute distress Head:  Normocephalic and atraumatic. Eyes :  sclerae anicteric,conjunctive pink  Heart:  regular rate and rhythm, systolic murmur Pulm:  Clear anteriorly; no wheezing Abdomen:   Soft, mordibly Obese AB, Normal bowel sounds.  no  tenderness . Without guarding and Without rebound, without hepatomegaly. no  fluid wave, no  shifting dullness.  Extremities:   Without edema. Msk:  Symmetrical without gross deformities. Peripheral pulses intact. Patient in wheel chair, gait not tested.  Neurologic: Alert and  oriented x4;  grossly normal  neurologically. without asterixis or clonus.  Skin:   without jaundice. no palmar erythema or spider angioma.   Psychiatric:  Demonstrates good judgement and reason without abnormal affect or behaviors.    Doree Albee, PA-C 9:45 AM

## 2023-04-17 NOTE — Progress Notes (Signed)
Noted  

## 2023-04-18 NOTE — Progress Notes (Unsigned)
[facility]  [address] [place],  [place]  [address] [phone]  Clinic Day:  10/16/2022  Referring physician: Marylen Ponto, MD  HISTORY OF PRESENT ILLNESS:  The patient is a 67 y.o. female  with thrombocytopenia.  She was also found to be iron deficient recently for which she received IV iron.   She comes in today to reassess her hemoglobin and low platelets.  Since her last visit, the patient did undergo right knee cap surgery.  From an anemia perspective, the patient is doing well.  She denies having increased fatigue or any overt forms of blood loss.  She continues to deny having any underlying bleeding/bruising issues which concern her for worsening thrombocytopenia being present.    PHYSICAL EXAM:  There were no vitals taken for this visit. Wt Readings from Last 3 Encounters:  04/17/23 (!) 306 lb (138.8 kg)  02/06/23 295 lb (133.8 kg)  01/01/23 (!) 304 lb (137.9 kg)   There is no height or weight on file to calculate BMI. Performance status (ECOG): 1 - Symptomatic but completely ambulatory Physical Exam Constitutional:      Appearance: Normal appearance. She is not ill-appearing.     Comments: She ambulates with a walker.  HENT:     Mouth/Throat:     Mouth: Mucous membranes are moist.     Pharynx: Oropharynx is clear. No oropharyngeal exudate or posterior oropharyngeal erythema.  Cardiovascular:     Rate and Rhythm: Normal rate and regular rhythm.     Heart sounds: No murmur heard.    No friction rub. No gallop.  Pulmonary:     Effort: Pulmonary effort is normal. No respiratory distress.     Breath sounds: Normal breath sounds. No wheezing, rhonchi or rales.  Abdominal:     General: Bowel sounds are normal. There is no distension.     Palpations: Abdomen is soft. There is no mass.     Tenderness: There is no abdominal tenderness.  Musculoskeletal:        General: No swelling.     Right lower leg: No edema.     Left lower leg:  No edema.  Lymphadenopathy:     Cervical: No cervical adenopathy.     Upper Body:     Right upper body: No supraclavicular or axillary adenopathy.     Left upper body: No supraclavicular or axillary adenopathy.     Lower Body: No right inguinal adenopathy. No left inguinal adenopathy.  Skin:    General: Skin is warm.     Coloration: Skin is not jaundiced.     Findings: No lesion or rash.  Neurological:     General: No focal deficit present.     Mental Status: She is alert and oriented to person, place, and time. Mental status is at baseline.  Psychiatric:        Mood and Affect: Mood normal.        Behavior: Behavior normal.        Thought Content: Thought content normal.    LABS:     Latest Reference Range & Units 10/16/22 10:06  Sodium 135 - 145 mmol/L 143  Potassium 3.5 - 5.1 mmol/L 3.6  Chloride 98 - 111 mmol/L 112 (H)  CO2 22 - 32 mmol/L 24  Glucose 70 - 99 mg/dL 94  BUN 8 - 23 mg/dL 9  Creatinine 6.96 - 2.95 mg/dL 2.84  Calcium 8.9 - 13.2 mg/dL 44.0 (H)  Anion gap 5 -  15  7  Alkaline Phosphatase 38 - 126 U/L 100  Albumin 3.5 - 5.0 g/dL 3.2 (L)  AST 15 - 41 U/L 41  ALT 0 - 44 U/L 20  Total Protein 6.5 - 8.1 g/dL 5.7 (L)  Total Bilirubin 0.3 - 1.2 mg/dL 1.4 (H)  GFR, Est Non African American >60 mL/min >60  (H): Data is abnormally high (L): Data is abnormally low   Latest Reference Range & Units 10/16/22 10:06  Iron 28 - 170 ug/dL 469  UIBC ug/dL 629  TIBC 528 - 413 ug/dL 244  Saturation Ratios 10.4 - 31.8 % 39 (H)  Ferritin 11 - 307 ng/mL 46  (H): Data is abnormally high  ASSESSMENT & PLAN:  A 67 y.o. female with thrombocythemia and iron deficiency anemia.  I am very pleased with the improvement in her hemoglobin since her IV iron was given.  Her platelet count of 86 today is essentially unchanged versus what it was 6 months ago.  Her thrombocytopenia will continue to be followed conservatively.   As she is doing well from a hematologic perspective, I will see  this patient back in 6 months for repeat clinical assessment.  The patient understands all the plans discussed today and is in agreement with them.  Imonie Tuch Kirby Funk, MD

## 2023-04-19 ENCOUNTER — Inpatient Hospital Stay: Payer: PPO | Attending: Oncology | Admitting: Oncology

## 2023-04-19 ENCOUNTER — Other Ambulatory Visit: Payer: Self-pay | Admitting: Oncology

## 2023-04-19 ENCOUNTER — Telehealth: Payer: Self-pay | Admitting: Oncology

## 2023-04-19 ENCOUNTER — Inpatient Hospital Stay: Payer: PPO

## 2023-04-19 VITALS — BP 126/74 | HR 90 | Temp 98.4°F | Resp 16 | Ht 68.0 in

## 2023-04-19 DIAGNOSIS — Z862 Personal history of diseases of the blood and blood-forming organs and certain disorders involving the immune mechanism: Secondary | ICD-10-CM | POA: Insufficient documentation

## 2023-04-19 DIAGNOSIS — D509 Iron deficiency anemia, unspecified: Secondary | ICD-10-CM

## 2023-04-19 DIAGNOSIS — D696 Thrombocytopenia, unspecified: Secondary | ICD-10-CM | POA: Diagnosis not present

## 2023-04-19 NOTE — Progress Notes (Signed)
[facility]  [address] [place],  [place]  [address] [phone]  Clinic Day:  04/19/2023  Referring physician: Marylen Ponto, MD   HISTORY OF PRESENT ILLNESS:  The patient is a 67 y.o. female with thrombocytopenia.  She was also found to be iron deficient recently for which she last received IV iron in November 2023.   She comes in today to reassess her hemoglobin and low platelets.  Since her last visit, the patient has been doing okay.  From an anemia perspective, she denies having increased fatigue or any overt forms of blood loss.  She continues to deny having any underlying bleeding/bruising issues which concern her for worsening thrombocytopenia being present.  Of note, a CT scan done this month showed morphologic changes in her liver that were suspicious for cirrhosis.  Here was no evidence of splenomegaly.     PHYSICAL EXAM:  Blood pressure 126/74, pulse 90, temperature 98.4 F (36.9 C), resp. rate 16, height 5\' 8"  (1.727 m), SpO2 99%. Wt Readings from Last 3 Encounters:  04/17/23 (!) 306 lb (138.8 kg)  02/06/23 295 lb (133.8 kg)  01/01/23 (!) 304 lb (137.9 kg)   Body mass index is 46.53 kg/m. Performance status (ECOG): 2 - Symptomatic, <50% confined to bed Physical Exam Constitutional:      Appearance: Normal appearance. She is obese. She is not ill-appearing.     Comments: She is in a wheelchair  HENT:     Mouth/Throat:     Mouth: Mucous membranes are moist.     Pharynx: Oropharynx is clear. No oropharyngeal exudate or posterior oropharyngeal erythema.  Cardiovascular:     Rate and Rhythm: Normal rate and regular rhythm.     Heart sounds: No murmur heard.    No friction rub. No gallop.  Pulmonary:     Effort: Pulmonary effort is normal. No respiratory distress.     Breath sounds: Normal breath sounds. No wheezing, rhonchi or rales.  Abdominal:     General: Bowel sounds are normal. There is no distension.     Palpations:  Abdomen is soft. There is no mass.     Tenderness: There is no abdominal tenderness.  Musculoskeletal:        General: No swelling.     Right lower leg: No edema.     Left lower leg: No edema.  Lymphadenopathy:     Cervical: No cervical adenopathy.     Upper Body:     Right upper body: No supraclavicular or axillary adenopathy.     Left upper body: No supraclavicular or axillary adenopathy.     Lower Body: No right inguinal adenopathy. No left inguinal adenopathy.  Skin:    General: Skin is warm.     Coloration: Skin is not jaundiced.     Findings: No lesion or rash.  Neurological:     General: No focal deficit present.     Mental Status: She is alert and oriented to person, place, and time. Mental status is at baseline.  Psychiatric:        Mood and Affect: Mood normal.        Behavior: Behavior normal.        Thought Content: Thought content normal.     LABS:      Latest Ref Rng & Units 04/17/2023   10:16 AM 11/29/2022   10:54 AM 10/16/2022   12:00 AM  CBC  WBC 4.0 - 10.5 K/uL 4.6  3.8  5.0  Hemoglobin 12.0 - 15.0 g/dL 10.2  72.5  36.6      Hematocrit 36.0 - 46.0 % 36.8  39.3  42      Platelets 150.0 - 400.0 K/uL 68.0  61  86         This result is from an external source.      Latest Ref Rng & Units 04/17/2023   10:16 AM 04/08/2023   11:44 AM 11/29/2022   10:54 AM  CMP  Glucose 70 - 99 mg/dL 83   97   BUN 6 - 23 mg/dL 12   10   Creatinine 4.40 - 1.20 mg/dL 3.47  4.25  9.56   Sodium 135 - 145 mEq/L 140   139   Potassium 3.5 - 5.1 mEq/L 3.3   3.2   Chloride 96 - 112 mEq/L 110   111   CO2 19 - 32 mEq/L 24   20   Calcium 8.4 - 10.5 mg/dL 9.3   9.5   Total Protein 6.0 - 8.3 g/dL 5.0   4.9   Total Bilirubin 0.2 - 1.2 mg/dL 1.6   1.4   Alkaline Phos 39 - 117 U/L 109   128   AST 0 - 37 U/L 43   48   ALT 0 - 35 U/L 20   29     Latest Reference Range & Units 04/17/23 10:16  Iron 42 - 145 ug/dL 387  TIBC 564.3 - 329.5 mcg/dL 188.4  Saturation Ratios 20.0 - 50.0  % 51.2 (H)  Ferritin 10.0 - 291.0 ng/mL 110.3  Transferrin 212.0 - 360.0 mg/dL 166.0 (L)  (H): Data is abnormally high (L): Data is abnormally low   STUDIES:  CT ABDOMEN PELVIS W CONTRAST  Result Date: 04/08/2023 CLINICAL DATA:  Left flank pain. EXAM: CT ABDOMEN AND PELVIS WITH CONTRAST TECHNIQUE: Multidetector CT imaging of the abdomen and pelvis was performed using the standard protocol following bolus administration of intravenous contrast. RADIATION DOSE REDUCTION: This exam was performed according to the departmental dose-optimization program which includes automated exposure control, adjustment of the mA and/or kV according to patient size and/or use of iterative reconstruction technique. CONTRAST:  OMNIPAQUE IOHEXOL 300 MG/ML  SOLN COMPARISON:  Abdominopelvic CT 03/30/2023, 04/13/2021 and 08/15/2018. FINDINGS: Lower chest: Stable chronic elevation of the left hemidiaphragm with chronic left lower lobe atelectasis. Previous median sternotomy and aortic valve replacement. No significant pleural or pericardial effusion. Hepatobiliary: The liver is normal in density without suspicious focal abnormality. Several peripherally calcified subcapsular liver lesions appear chronic and unchanged. Possible mild morphologic changes of cirrhosis. No evidence of biliary dilatation status post cholecystectomy. Pancreas: Mild atrophy. No focal abnormality, ductal dilatation or surrounding inflammation. Spleen: Normal in size without focal abnormality. Adrenals/Urinary Tract: Both adrenal glands appear normal. No evidence of urinary tract calculus, suspicious renal lesion or hydronephrosis. Unchanged central left renal parapelvic cyst and cortical scarring for which no specific follow-up imaging is recommended. The bladder appears unremarkable for its degree of distention. Stomach/Bowel: No enteric contrast administered. The stomach appears unremarkable for its degree of distension. No evidence of bowel wall  thickening, distention or surrounding inflammatory change. The appendix is not visualized and may be surgically absent. Vascular/Lymphatic: There are no enlarged abdominal or pelvic lymph nodes. Aortic and branch vessel atherosclerosis without evidence of aneurysm or large vessel occlusion. The portal, superior mesenteric and splenic veins are patent. Multiple large left abdominal varices with a probable splenorenal shunt, enlarged from 2020. Reproductive: Hysterectomy.  No adnexal mass.  Other: There is a small amount of free pelvic fluid. No focal extraluminal fluid collection or pneumoperitoneum. The abdominal wall appears intact. Musculoskeletal: No acute or significant osseous findings. Asymmetric right hip arthropathy. Unless specific follow-up recommendations are mentioned in the findings or impression sections, no imaging follow-up of any mentioned incidental findings is recommended. IMPRESSION: 1. No acute findings or explanation for the patient's symptoms. No evidence of urinary tract calculus or hydronephrosis. 2. Multiple large left abdominal varices with a probable splenorenal shunt, enlarged from 2020 and suggesting portal hypertension. Possible mild morphologic changes of cirrhosis. 3. Small amount of free pelvic fluid, nonspecific. 4. Chronic elevation of the left hemidiaphragm with chronic left basilar atelectasis. 5.  Aortic Atherosclerosis (ICD10-I70.0). Electronically Signed   By: Carey Bullocks M.D.   On: 04/08/2023 11:35   CT ABDOMEN PELVIS WO CONTRAST  Result Date: 03/30/2023 CLINICAL DATA:  Left flank pain and hematuria EXAM: CT ABDOMEN AND PELVIS WITHOUT CONTRAST TECHNIQUE: Multidetector CT imaging of the abdomen and pelvis was performed following the standard protocol without IV contrast. RADIATION DOSE REDUCTION: This exam was performed according to the departmental dose-optimization program which includes automated exposure control, adjustment of the mA and/or kV according to patient  size and/or use of iterative reconstruction technique. COMPARISON:  CT abdomen and pelvis 04/13/2021. CT abdomen and pelvis 08/15/2018 FINDINGS: Lower chest: There is mild elevation of the left hemidiaphragm. There some atelectasis in the left lung base. Hepatobiliary: No focal liver abnormality is seen. Status post cholecystectomy. No biliary dilatation. Pancreas: Unremarkable. No pancreatic ductal dilatation or surrounding inflammatory changes. Spleen: Normal in size without focal abnormality. Adrenals/Urinary Tract: There is scarring in the left kidney. There is a cyst in the left kidney measuring 3.6 cm. There is no hydronephrosis or urinary tract calculus. The adrenal glands, right kidney and bladder are within normal limits. Stomach/Bowel: Stomach is within normal limits. No evidence of bowel wall thickening, distention, or inflammatory changes. The appendix is not seen. Vascular/Lymphatic: Aortic atherosclerosis. No enlarged abdominal or pelvic lymph nodes. There are multiple new serpiginous densities tracking along the left retroperitoneum worrisome for varices. Lymphadenopathy would be difficult to exclude at this level. These are new from prior. Aorta is normal in size. Reproductive: Status post hysterectomy. No adnexal masses. Other: No abdominal wall hernia or abnormality. No abdominopelvic ascites. Musculoskeletal: No acute or significant osseous findings. IMPRESSION: 1. No hydronephrosis or urinary tract calculus. 2. New serpiginous densities tracking along the left retroperitoneum worrisome for varices. Lymphadenopathy would be difficult to exclude at this level. Recommend further evaluation with contrast-enhanced CT. 3. Left Bosniak I benign renal cyst measuring 3.6 cm. No follow-up imaging is recommended. JACR 2018 Feb; 264-273, Management of the Incidental Renal Mass on CT, [employer] 2021; [id number], Bosniak Classification of Cystic Renal Masses, Version 2019. Aortic Atherosclerosis  (ICD10-I70.0). Electronically Signed   By: Darliss Cheney M.D.   On: 03/30/2023 18:48     ASSESSMENT & PLAN:  Assessment/Plan:  A 67 y.o. female with thrombocytopenia and a history of iron deficiency anemia.  Her platelets are mildly lower than what they have been previously.  However, she has more than enough platelets to do the clotting her body needs.  If she has cirrhosis, this could be the ultimate etiology behind her thrombocytopenia.  She apparently is going to undergo a GI workup in the near future.  With respect to her iron deficiency anemia, although lower, her hemoglobin remains normal.  Furthermore, her iron parameters remain normal.  For now, all of her  hematologic parameters will be followed conservatively.  I will see her back in 6 months for repeat clinical assessment.  The patient understands all the plans discussed today and is in agreement with them.    Aditri Louischarles Kirby Funk, MD

## 2023-04-19 NOTE — Telephone Encounter (Signed)
04/19/23 Next appt scheduled and confirmed with patient.

## 2023-04-22 LAB — HEPATITIS A ANTIBODY, TOTAL: Hepatitis A AB,Total: NONREACTIVE

## 2023-04-22 LAB — MITOCHONDRIAL ANTIBODIES: Mitochondrial M2 Ab, IgG: <=20 U (ref <=20.0)

## 2023-04-22 LAB — TISSUE TRANSGLUTAMINASE, IGA: (tTG) Ab, IgA: <1 U/mL

## 2023-04-22 LAB — AFP TUMOR MARKER: AFP-Tumor Marker: 5.8 ng/mL

## 2023-04-22 LAB — HEPATITIS B SURFACE ANTIGEN: Hepatitis B Surface Ag: NONREACTIVE

## 2023-04-22 LAB — HEPATITIS B SURFACE ANTIBODY,QUALITATIVE: Hep B S Ab: NONREACTIVE

## 2023-04-22 LAB — IGG: IgG (Immunoglobin G), Serum: 718 mg/dL (ref 600–1540)

## 2023-04-22 LAB — ANA: Anti Nuclear Antibody (ANA): NEGATIVE

## 2023-04-22 LAB — ANTI-SMOOTH MUSCLE ANTIBODY, IGG: Actin (Smooth Muscle) Antibody (IGG): <20 U (ref <20)

## 2023-04-22 LAB — HEPATITIS C ANTIBODY: Hepatitis C Ab: NONREACTIVE

## 2023-04-22 LAB — IGA: Immunoglobulin A: 121 mg/dL (ref 70–320)

## 2023-04-24 DIAGNOSIS — Z13820 Encounter for screening for osteoporosis: Secondary | ICD-10-CM | POA: Diagnosis not present

## 2023-04-24 DIAGNOSIS — M1711 Unilateral primary osteoarthritis, right knee: Secondary | ICD-10-CM | POA: Diagnosis not present

## 2023-04-27 ENCOUNTER — Telehealth: Payer: Self-pay | Admitting: Physician Assistant

## 2023-04-27 NOTE — Telephone Encounter (Signed)
Diet and food list copied from OV note and sent to pt per her request. Lab order faxed to [facility] per pt request.

## 2023-04-27 NOTE — Telephone Encounter (Signed)
PT was told that she needed to have her potassium rechecked and she would like to do it at [facility]. She also would like to have the list of foods they discussed at last OV

## 2023-05-01 DIAGNOSIS — I10 Essential (primary) hypertension: Secondary | ICD-10-CM | POA: Diagnosis not present

## 2023-05-09 ENCOUNTER — Encounter (HOSPITAL_COMMUNITY): Payer: Self-pay | Admitting: Internal Medicine

## 2023-05-09 DIAGNOSIS — M1711 Unilateral primary osteoarthritis, right knee: Secondary | ICD-10-CM | POA: Diagnosis not present

## 2023-05-15 NOTE — Anesthesia Preprocedure Evaluation (Signed)
Anesthesia Evaluation  Patient identified by MRN, date of birth, ID band Patient awake    Reviewed: Allergy & Precautions, NPO status , Patient's Chart, lab work & pertinent test results, reviewed documented beta blocker date and time   History of Anesthesia Complications (+) DIFFICULT AIRWAY and history of anesthetic complications  Airway Mallampati: III  TM Distance: >3 FB Neck ROM: Full    Dental  (+) Dental Advisory Given, Teeth Intact   Pulmonary sleep apnea and Continuous Positive Airway Pressure Ventilation    Pulmonary exam normal        Cardiovascular hypertension, Pt. on home beta blockers and Pt. on medications + CAD  Normal cardiovascular exam+ Valvular Problems/Murmurs    '24 TTE - EF 60-65%. Mild concentric hypertrophy of the left ventricle. Left atrial cavity is moderately dilated at 4.8 cm. Well seated bioprosthetic aortic valve (Inspiris resilia size 25mm, per op note). Mild tricuspid regurgitation. RVSP measures 31 mmHg. The aortic root is mildly dilated at 4.0 cm.     Neuro/Psych  Headaches  negative psych ROS   GI/Hepatic Neg liver ROS, hiatal hernia,GERD  Medicated and Controlled,,  Endo/Other  diabetes, Type 2Hypothyroidism    Renal/GU CRFRenal disease     Musculoskeletal  (+) Arthritis ,  Fibromyalgia -  Abdominal   Peds  Hematology negative hematology ROS (+)   Anesthesia Other Findings   Reproductive/Obstetrics                             Anesthesia Physical Anesthesia Plan  ASA: 3  Anesthesia Plan: MAC   Post-op Pain Management: Minimal or no pain anticipated   Induction:   PONV Risk Score and Plan: 2 and Propofol infusion and Treatment may vary due to age or medical condition  Airway Management Planned: Nasal Cannula and Natural Airway  Additional Equipment: None  Intra-op Plan:   Post-operative Plan:   Informed Consent: I have reviewed the  patients History and Physical, chart, labs and discussed the procedure including the risks, benefits and alternatives for the proposed anesthesia with the patient or authorized representative who has indicated his/her understanding and acceptance.       Plan Discussed with: CRNA and Anesthesiologist  Anesthesia Plan Comments:        Anesthesia Quick Evaluation

## 2023-05-16 ENCOUNTER — Ambulatory Visit (HOSPITAL_BASED_OUTPATIENT_CLINIC_OR_DEPARTMENT_OTHER): Payer: Self-pay | Admitting: Anesthesiology

## 2023-05-16 ENCOUNTER — Encounter (HOSPITAL_COMMUNITY): Payer: Self-pay | Admitting: Internal Medicine

## 2023-05-16 ENCOUNTER — Other Ambulatory Visit: Payer: Self-pay

## 2023-05-16 ENCOUNTER — Ambulatory Visit (HOSPITAL_COMMUNITY)
Admission: RE | Admit: 2023-05-16 | Discharge: 2023-05-16 | Disposition: A | Discharge status: Home / Self Care | Payer: PPO | Attending: Internal Medicine | Admitting: Internal Medicine

## 2023-05-16 ENCOUNTER — Encounter (HOSPITAL_COMMUNITY)
Admission: RE | Disposition: A | Discharge status: Home / Self Care | Payer: Self-pay | Source: Home / Self Care | Attending: Internal Medicine

## 2023-05-16 ENCOUNTER — Ambulatory Visit (HOSPITAL_COMMUNITY): Payer: Self-pay | Admitting: Anesthesiology

## 2023-05-16 DIAGNOSIS — N1832 Chronic kidney disease, stage 3b: Secondary | ICD-10-CM | POA: Diagnosis not present

## 2023-05-16 DIAGNOSIS — K317 Polyp of stomach and duodenum: Secondary | ICD-10-CM | POA: Diagnosis not present

## 2023-05-16 DIAGNOSIS — I351 Nonrheumatic aortic (valve) insufficiency: Secondary | ICD-10-CM | POA: Diagnosis not present

## 2023-05-16 DIAGNOSIS — D693 Immune thrombocytopenic purpura: Secondary | ICD-10-CM | POA: Insufficient documentation

## 2023-05-16 DIAGNOSIS — K219 Gastro-esophageal reflux disease without esophagitis: Secondary | ICD-10-CM | POA: Diagnosis not present

## 2023-05-16 DIAGNOSIS — K2100 Gastro-esophageal reflux disease with esophagitis, without bleeding: Secondary | ICD-10-CM | POA: Insufficient documentation

## 2023-05-16 DIAGNOSIS — I2510 Atherosclerotic heart disease of native coronary artery without angina pectoris: Secondary | ICD-10-CM | POA: Diagnosis not present

## 2023-05-16 DIAGNOSIS — I130 Hypertensive heart and chronic kidney disease with heart failure and stage 1 through stage 4 chronic kidney disease, or unspecified chronic kidney disease: Secondary | ICD-10-CM | POA: Insufficient documentation

## 2023-05-16 DIAGNOSIS — K5909 Other constipation: Secondary | ICD-10-CM | POA: Insufficient documentation

## 2023-05-16 DIAGNOSIS — I868 Varicose veins of other specified sites: Secondary | ICD-10-CM | POA: Insufficient documentation

## 2023-05-16 DIAGNOSIS — K5904 Chronic idiopathic constipation: Secondary | ICD-10-CM

## 2023-05-16 DIAGNOSIS — N1831 Chronic kidney disease, stage 3a: Secondary | ICD-10-CM

## 2023-05-16 DIAGNOSIS — G4730 Sleep apnea, unspecified: Secondary | ICD-10-CM | POA: Insufficient documentation

## 2023-05-16 DIAGNOSIS — Z79899 Other long term (current) drug therapy: Secondary | ICD-10-CM | POA: Insufficient documentation

## 2023-05-16 DIAGNOSIS — I509 Heart failure, unspecified: Secondary | ICD-10-CM | POA: Insufficient documentation

## 2023-05-16 DIAGNOSIS — R7989 Other specified abnormal findings of blood chemistry: Secondary | ICD-10-CM | POA: Diagnosis not present

## 2023-05-16 DIAGNOSIS — I10 Essential (primary) hypertension: Secondary | ICD-10-CM | POA: Diagnosis not present

## 2023-05-16 DIAGNOSIS — T884XXD Failed or difficult intubation, subsequent encounter: Secondary | ICD-10-CM

## 2023-05-16 DIAGNOSIS — K7581 Nonalcoholic steatohepatitis (NASH): Secondary | ICD-10-CM

## 2023-05-16 DIAGNOSIS — K7460 Unspecified cirrhosis of liver: Secondary | ICD-10-CM | POA: Diagnosis not present

## 2023-05-16 DIAGNOSIS — Z953 Presence of xenogenic heart valve: Secondary | ICD-10-CM | POA: Diagnosis not present

## 2023-05-16 DIAGNOSIS — K449 Diaphragmatic hernia without obstruction or gangrene: Secondary | ICD-10-CM | POA: Diagnosis not present

## 2023-05-16 DIAGNOSIS — K766 Portal hypertension: Secondary | ICD-10-CM

## 2023-05-16 DIAGNOSIS — E1122 Type 2 diabetes mellitus with diabetic chronic kidney disease: Secondary | ICD-10-CM | POA: Diagnosis not present

## 2023-05-16 DIAGNOSIS — M797 Fibromyalgia: Secondary | ICD-10-CM | POA: Diagnosis not present

## 2023-05-16 DIAGNOSIS — E119 Type 2 diabetes mellitus without complications: Secondary | ICD-10-CM | POA: Diagnosis not present

## 2023-05-16 DIAGNOSIS — E538 Deficiency of other specified B group vitamins: Secondary | ICD-10-CM

## 2023-05-16 DIAGNOSIS — D696 Thrombocytopenia, unspecified: Secondary | ICD-10-CM

## 2023-05-16 DIAGNOSIS — D649 Anemia, unspecified: Secondary | ICD-10-CM | POA: Diagnosis not present

## 2023-05-16 HISTORY — PX: ESOPHAGOGASTRODUODENOSCOPY (EGD) WITH PROPOFOL: SHX5813

## 2023-05-16 SURGERY — ESOPHAGOGASTRODUODENOSCOPY (EGD) WITH PROPOFOL
Anesthesia: Monitor Anesthesia Care

## 2023-05-16 MED ORDER — PROPOFOL 10 MG/ML IV BOLUS
INTRAVENOUS | Status: DC | PRN
  Administered 2023-05-16: 40 mg via INTRAVENOUS
  Administered 2023-05-16 (×4): 50 mg via INTRAVENOUS

## 2023-05-16 MED ORDER — FENTANYL CITRATE (PF) 100 MCG/2ML IJ SOLN
INTRAMUSCULAR | Status: AC
  Filled 2023-05-16: qty 2

## 2023-05-16 MED ORDER — PROPOFOL 1000 MG/100ML IV EMUL
INTRAVENOUS | Status: AC
Start: 2023-05-16 — End: ?
  Filled 2023-05-16: qty 100

## 2023-05-16 MED ORDER — SODIUM CHLORIDE 0.9 % IV SOLN: INTRAVENOUS | Status: DC

## 2023-05-16 MED ORDER — LIDOCAINE 2% (20 MG/ML) 5 ML SYRINGE
INTRAMUSCULAR | Status: DC | PRN
  Administered 2023-05-16: 60 mg via INTRAVENOUS

## 2023-05-16 MED ORDER — FENTANYL CITRATE (PF) 100 MCG/2ML IJ SOLN
INTRAMUSCULAR | Status: DC | PRN
  Administered 2023-05-16 (×2): 25 ug via INTRAVENOUS

## 2023-05-16 MED ORDER — GLYCOPYRROLATE 0.2 MG/ML IJ SOLN
INTRAMUSCULAR | Status: DC | PRN
  Administered 2023-05-16: .1 mg via INTRAVENOUS

## 2023-05-16 SURGICAL SUPPLY — 14 items

## 2023-05-16 NOTE — Anesthesia Procedure Notes (Signed)
Procedure Name: MAC Date/Time: 05/16/2023 9:41 AM  Performed by: Floydene Flock, CRNAPre-anesthesia Checklist: Patient identified, Emergency Drugs available, Suction available, Patient being monitored and Timeout performed Patient Re-evaluated:Patient Re-evaluated prior to induction Oxygen Delivery Method: Simple face mask Preoxygenation: Pre-oxygenation with 100% oxygen Induction Type: IV induction Placement Confirmation: CO2 detector

## 2023-05-16 NOTE — Anesthesia Procedure Notes (Signed)
Procedure Name: MAC Date/Time: 05/16/2023 9:21 AM  Performed by: Floydene Flock, CRNAPre-anesthesia Checklist: Patient identified, Emergency Drugs available, Suction available, Patient being monitored and Timeout performed Patient Re-evaluated:Patient Re-evaluated prior to induction Oxygen Delivery Method: Simple face mask Preoxygenation: Pre-oxygenation with 100% oxygen Induction Type: IV induction Placement Confirmation: CO2 detector

## 2023-05-16 NOTE — H&P (Signed)
Expand All Collapse All        04/17/2023 Joanna Alvarado [id number] 11-02-1955   Referring provider: Marylen Ponto, MD Primary GI doctor: Dr. Marina Goodell   ASSESSMENT AND PLAN:     Likely cirrhosis secondary to [facility] by history and physical MELD 3.0: 13 on 11/29/2022  Elevated liver function tests, possible morphologic changes of cirrhosis on CT, and multiple large left abdominal varices with probable splenorenal shunt enlarged from 2020, suggesting portal hypertension. No history of alcohol or drug use. No hepatocellular workup done previously. - Labs to exclude other causes of cirrhossis: hepatitis C antibody, hepatitis B surface antigen, iron, ferritin, total iron binding capacity, IgG, ANA, celiac -Will add on lactulose for history of chronic constipation can continue Linzess as needed -Will check PT/INR, ammonia level and calculate MELD -Discussed low-sodium diet 2 g daily or less, discussed protein snack at night, discussed monitoring weight and for edema.   Constipation Chronic constipation, not fully relieved by Linzess, Miralax, or stool softeners. Alvarado has difficulty affording Linzess. -Provide Linzess samples, 290 mcg -Add on lactulose twice daily as needed for constipation can also be helpful for ammonia levels though no evidence of HE on exam   Portal hypertension with abdominal varices some reflux and history of IDA Recent normal colonoscopy Will plan for EGD for variceal screening in the hospital with Dr. Marina Goodell, Alvarado is difficult intubation I discussed risks of EGD with Alvarado today, including risk of sedation, bleeding or perforation.  Alvarado provides understanding and gave verbal consent to proceed.   Cardiovascular Health History of hypertension, CAD, heart failure, aortic regurgitation moderate. Bioprosthetic aortic valve. Ejection fraction 60-65% on 4/10/124. -Continue follow-up with cardiology   Colon Polyps History of multiple adenomatous polyps.  Colonoscopy on 7/20 showed two polyps. Follow-up in 5 years. -Continue current management.   Thrombocytopenia likely secondary to cirrhosis Chronic low platelets (61,000). -Continue monitoring, may need to recheck prior to hospitalization for EGD   Follow-up 6 months for evaluation     Alvarado Care Team: Marylen Ponto, MD as PCP - General (Family Medicine) Yates Decamp, MD as Consulting Physician (Cardiology) Weston Settle, MD as Consulting Physician (Oncology)   HISTORY OF PRESENT ILLNESS: Discussed the use of AI scribe software for clinical note transcription with the Alvarado, who gave verbal consent to proceed.   A Joanna Alvarado with a complex medical history including hypertension, coronary artery disease, heart failure, moderate aortic regurgitation, sleep apnea, gastroesophageal reflux disease, type 2 diabetes, hyperthyroidism, stage 3B chronic kidney disease, thrombocytopenia, iron deficiency anemia, and a history of colon polyps, presents with elevated liver function.    The Alvarado has a history of multiple adenomatous polyps, with the most recent colonoscopy revealing two polyps. The Alvarado's most recent echocardiogram showed an ejection fraction of 60-65% and a bioprosthetic aortic valve.   The Alvarado's most recent imaging of the abdomen showed  possible mild morphologic changes of cirrhosis no lesions, and multiple large left abdominal varices with probable splenorenal shunt enlarged from the previous year, suggesting portal hypertension. The Alvarado's most recent liver labs showed an INR of 1.4, potassium of 3.2, albumin of 2.9, AST of 48, ALKPAS of 128, and total bilirubin of 1.4. The Alvarado's CBC showed platelets of 61, with no anemia.   The Alvarado has been experiencing constipation, with an episode of not having a bowel movement for 14 days. The Alvarado has been taking Linzess, but not daily due to cost, and has been supplementing with a stool softener and  Miralax. The Alvarado has also noticed yellowing of the eyes, specifically the left eye, and has had yellow pus discharge from the same eye that has resolved.  Alvarado has swelling bilateral legs worse if her legs hanging for 2 hours better at night, denies abdominal swelling. Has had some generalized pruritus for the last several months no rashes. Denies any confusion or slow mentation. The Alvarado denies any history of alcohol or drug use.  Denies family history of liver issues.     She  reports that she has never smoked. She has never used smokeless tobacco. She reports that she does not drink alcohol and does not use drugs.   RELEVANT LABS AND IMAGING:   Results   LABS INR: 1.4 (11/29/2022) Potassium: 3.2 (11/29/2022) Albumin: 2.9 (11/29/2022) AST: 48 (11/29/2022) ALP: 128 (11/29/2022) Total bilirubin: 1.4 (11/29/2022) CBC with platelets: PLT 61 (11/29/2022) Iron: 146 (10/16/2022) Ferritin: 46 (10/16/2022)   RADIOLOGY CT abdomen pelvis with contrast: Normal density of liver without suspicious focal abnormality, subcapsular liver lesions appear chronic unchanged, possible mild morphologic changes of cirrhosis, no evidence of biliary dilation, status post cholecystitis, mild atrophy of the pancreas without ductal dilation, normal spleen, unremarkable stomach and bowel without inflammatory changes or obstruction, multiple large left abdominal varices with probable splenorenal shunt enlarged from 2020, suggesting portal hypertension (04/08/2023)   DIAGNOSTIC Colonoscopy: Three polyps, 2-4 mm, ascending in cecum, diverticulosis, left colon (12/23/2022) Echocardiogram: Ejection fraction 60-65%, bioprosthetic aortic valve (09/13/2022)       CBC Labs (Brief)          Component Value Date/Time    WBC 3.8 (L) 11/29/2022 1054    RBC 4.29 11/29/2022 1054    HGB 13.2 11/29/2022 1054    HGB 11.1 03/13/2022 1532    HCT 39.3 11/29/2022 1054    HCT 34.2 03/13/2022 1532    PLT 61 (L)  11/29/2022 1054    PLT 87 (LL) 03/13/2022 1532    MCV 91.6 11/29/2022 1054    MCV 84 03/13/2022 1532    MCV 94 03/15/2021 0000    MCH 30.8 11/29/2022 1054    MCHC 33.6 11/29/2022 1054    RDW 13.9 11/29/2022 1054    RDW 14.8 03/13/2022 1532    LYMPHSABS 2.2 08/15/2018 1734    MONOABS 0.8 08/15/2018 1734    EOSABS 0.2 08/15/2018 1734    BASOSABS 0.1 08/15/2018 1734      Recent Labs (within last 365 days)      Recent Labs    10/16/22 0000 11/29/22 1054  HGB 14.2 13.2        CMP     Labs (Brief)          Component Value Date/Time    NA 139 11/29/2022 1054    NA 146 (H) 04/10/2022 1500    K 3.2 (L) 11/29/2022 1054    CL 111 11/29/2022 1054    CO2 20 (L) 11/29/2022 1054    GLUCOSE 97 11/29/2022 1054    BUN 10 11/29/2022 1054    BUN 16 04/10/2022 1500    CREATININE 1.10 (H) 04/08/2023 1144    CREATININE 0.91 10/16/2022 1006    CALCIUM 9.5 11/29/2022 1054    PROT 4.9 (L) 11/29/2022 1054    PROT 5.4 (L) 01/03/2022 1206    ALBUMIN 2.9 (L) 11/29/2022 1054    ALBUMIN 3.4 (L) 01/03/2022 1206    AST 48 (H) 11/29/2022 1054    AST 41 10/16/2022 1006    ALT 29 11/29/2022 1054  ALT 20 10/16/2022 1006    ALKPHOS 128 (H) 11/29/2022 1054    BILITOT 1.4 (H) 11/29/2022 1054    BILITOT 1.4 (H) 10/16/2022 1006    GFRNONAA >60 11/29/2022 1054    GFRNONAA >60 10/16/2022 1006    GFRAA 56 (L) 09/30/2019 1041          Latest Ref Rng & Units 11/29/2022   10:54 AM 10/16/2022   10:06 AM 04/17/2022   10:16 AM  Hepatic Function  Total Protein 6.5 - 8.1 g/dL 4.9  5.7  5.9   Albumin 3.5 - 5.0 g/dL 2.9  3.2  3.2   AST 15 - 41 U/L 48  41  34   ALT 0 - 44 U/L 29  20  17    Alk Phosphatase 38 - 126 U/L 128  100  109   Total Bilirubin 0.3 - 1.2 mg/dL 1.4  1.4  1.0       Current Medications:    Current Outpatient Medications (Endocrine & Metabolic):    fludrocortisone (FLORINEF) 0.1 MG tablet, Take 1 tablet (0.1 mg total) by mouth every evening. (Alvarado taking differently: Take 0.1 mg  by mouth daily.)   levothyroxine (SYNTHROID) 150 MCG tablet, Take 150 mcg by mouth daily before breakfast.   Current Outpatient Medications (Cardiovascular):    metoprolol tartrate (LOPRESSOR) 25 MG tablet, Take 1 tablet (25 mg total) by mouth 3 (three) times daily as needed (Palpitations).   nitroGLYCERIN (NITROSTAT) 0.4 MG SL tablet, Place 1 tablet (0.4 mg total) under the tongue every 5 (five) minutes as needed for chest pain.   rosuvastatin (CRESTOR) 5 MG tablet, TAKE ONE TABLET BY MOUTH DAILY   Current Outpatient Medications (Respiratory):    albuterol (VENTOLIN HFA) 108 (90 Base) MCG/ACT inhaler, Inhale 2 puffs into the lungs every 4 (four) hours as needed for wheezing or shortness of breath.   Current Outpatient Medications (Analgesics):    acetaminophen (TYLENOL) 500 MG tablet, Take 1,000 mg by mouth every 6 (six) hours as needed for moderate pain.   traMADol (ULTRAM) 50 MG tablet, Take 1 tablet (50 mg total) by mouth every 6 (six) hours as needed for moderate pain. (Alvarado taking differently: Take 25-50 mg by mouth every 6 (six) hours as needed for moderate pain (pain score 4-6).)     Current Outpatient Medications (Other):    ergocalciferol (VITAMIN D2) 1.25 MG (50000 UT) capsule, Take 50,000 Units by mouth every Tuesday.   lactulose (CHRONULAC) 10 GM/15ML solution, Take 15-30 ml up to twice daily for constipation   lidocaine (LIDODERM) 5 %, Place 1 patch onto the skin daily as needed (Knee pain). Remove & Discard patch within 12 hours or as directed by MD   linaclotide (LINZESS) 290 MCG CAPS capsule, Take 1 capsule (290 mcg total) by mouth daily before breakfast.   omeprazole (PRILOSEC) 40 MG capsule, Take 40 mg by mouth daily.   OVER THE COUNTER MEDICATION, Take 2 tablets by mouth in the morning. [facility]   polyethylene glycol (MIRALAX / GLYCOLAX) 17 g packet, Take 17 g by mouth daily as needed for mild constipation or moderate constipation.   Medical History:       Past Medical History:  Diagnosis Date   Abnormal myocardial perfusion study 09/26/2019   Anemia     Angina pectoris (HCC) - Class II-III 09/26/2019   Aortic valve insufficiency     Arthritis      KNEES   Benign neoplasm of ascending colon  Benign neoplasm of cecum     Benign neoplasm of descending colon     Benign neoplasm of sigmoid colon     Benign neoplasm of transverse colon     Bowel habit changes     Cataract      BILATERAL-REMOVED   CHF (congestive heart failure) (HCC)     Chronic postoperative pain 12/02/2012   Complication of anesthesia     Coronary artery disease     Diabetes mellitus without complication (HCC)     Difficult intubation     Disturbance of skin sensation 12/02/2012   Fibromyalgia     GERD (gastroesophageal reflux disease)     Head injury      scalp injury x2 required sutures   Headache      prior to hyster   Heart murmur     History of blood transfusion     History of colonic polyps     History of hiatal hernia     History of kidney stones     History of transfusion of platelets 11/2021   Hypertension     Hypothyroidism     Idiopathic thrombocytopenic purpura (ITP) (HCC)     Myalgia and myositis, unspecified 12/02/2012   Personal history of colonic polyps     Pneumonia     Primary hypothyroidism 11/30/2015   Senile nuclear sclerosis 04/24/2014   Sleep apnea     Thyroid disease     Thyroid nodule 11/30/2015   Type 2 diabetes mellitus without complications (HCC) 05/27/2018    11/17/22- A1C 4.9   Vitamin D deficiency 11/30/2015        Allergies:  Allergies       Allergies  Allergen Reactions   Adhesive [Tape]        Latex, band aids  causes whelts and blisters     Dermabond causes, welts   Psyllium Other (See Comments)      Severe constipation   Trulicity [Dulaglutide]        KIDNEY INFECTION,INABILITY TO HAVE BOWEL MOVEMENTS,BLOOD IN URINE   Hydrocodone-Acetaminophen Itching      Tolerates small/ infrequent doses   Latex  Other (See Comments)      Band-aids causes whelts and blisters   Oxycontin [Oxycodone Hcl] Other (See Comments)      "FELT LIKE HEAD WAS GOING TO EXPLODE" in head   Ciprofloxacin Rash      Yeast infection and a severe rash    Codeine Itching and Rash   Other Palpitations      Steroids      "makes me feel like I'm having a heart attack. -        Surgical History:  She  has a past surgical history that includes Tonsillectomy; Appendectomy; Abdominal hysterectomy; Cholecystectomy; left foot surgery; Eye surgery; floater bone surgery; Plantar fascia surgery; Colonoscopy (N/A, 07/27/2015); Foot surgery; Colonoscopy with propofol (N/A, 04/28/2019); polypectomy (04/28/2019); LEFT HEART CATH AND CORONARY ANGIOGRAPHY (N/A, 09/26/2019); Foot surgery (Left); RIGHT HEART CATH (N/A, 02/15/2021); RIGHT/LEFT HEART CATH AND CORONARY ANGIOGRAPHY (N/A, 11/08/2021); Aortic valve replacement (N/A, 11/24/2021); TEE without cardioversion (N/A, 11/24/2021); Fracture surgery; Sternal wires removal (N/A, 11/29/2022); Colonoscopy with propofol (N/A, 01/01/2023); and polypectomy (01/01/2023). Family History:  Her family history includes Colon polyps in her brother; Diabetes in her mother; Heart disease in her paternal grandmother; Heart disease (age of onset: 5) in her mother; Hypertension in her father and mother; Stroke in her maternal grandmother; Throat cancer in her paternal grandfather.   REVIEW OF SYSTEMS  :  All other systems reviewed and negative except where noted in the History of Present Illness.   PHYSICAL EXAM: BP 120/60   Ht 5\' 8"  (1.727 m)   Wt (!) 306 lb (138.8 kg)   BMI 46.53 kg/m  General :  Alert, well developed female in no acute distress Head:  Normocephalic and atraumatic. Eyes :  sclerae anicteric,conjunctive pink  Heart:  regular rate and rhythm, systolic murmur Pulm:  Clear anteriorly; no wheezing Abdomen:   Soft, mordibly Obese AB, Normal bowel sounds.  no  tenderness . Without guarding  and Without rebound, without hepatomegaly. no  fluid wave, no  shifting dullness.  Extremities:   Without edema. Msk:  Symmetrical without gross deformities. Peripheral pulses intact. Alvarado in wheel chair, gait not tested.  Neurologic: Alert and  oriented x4;  grossly normal neurologically. without asterixis or clonus.  Skin:   without jaundice. no palmar erythema or spider angioma.   Psychiatric:  Demonstrates good judgement and reason without abnormal affect or behaviors.       Doree Albee, PA-C 9:45 AM

## 2023-05-16 NOTE — Anesthesia Postprocedure Evaluation (Signed)
Anesthesia Post Note  Patient: Joanna Alvarado  Procedure(s) Performed: ESOPHAGOGASTRODUODENOSCOPY (EGD) WITH PROPOFOL     Patient location during evaluation: PACU Anesthesia Type: MAC Level of consciousness: awake and alert Pain management: pain level controlled Vital Signs Assessment: post-procedure vital signs reviewed and stable Respiratory status: spontaneous breathing, nonlabored ventilation and respiratory function stable Cardiovascular status: stable and blood pressure returned to baseline Anesthetic complications: no   No notable events documented.  Last Vitals:  Vitals:   05/16/23 1010 05/16/23 1014  BP: 127/66 131/69  Pulse: 81 85  Resp: 17 (!) 21  Temp:    SpO2: 96% 96%    Last Pain:  Vitals:   05/16/23 1014  TempSrc:   PainSc: 0-No pain                 Beryle Lathe

## 2023-05-16 NOTE — Discharge Instructions (Signed)

## 2023-05-16 NOTE — Transfer of Care (Signed)
Immediate Anesthesia Transfer of Care Note  Patient: Joanna Alvarado  Procedure(s) Performed: ESOPHAGOGASTRODUODENOSCOPY (EGD) WITH PROPOFOL  Patient Location: PACU  Anesthesia Type:General  Level of Consciousness: awake, alert , and oriented  Airway & Oxygen Therapy: Patient Spontanous Breathing  Post-op Assessment: Report given to RN and Post -op Vital signs reviewed and stable  Post vital signs: Reviewed and stable  Last Vitals:  Vitals Value Taken Time  BP 116/66   Temp 36.3 C 05/16/23 0958  Pulse 85 05/16/23 0959  Resp 12 05/16/23 0959  SpO2 97 % 05/16/23 0959  Vitals shown include unfiled device data.  Last Pain:  Vitals:   05/16/23 0958  TempSrc: Temporal  PainSc: 0-No pain         Complications: No notable events documented.

## 2023-05-16 NOTE — Op Note (Signed)
[facility] Patient Name: Joanna Alvarado Procedure Date: 05/16/2023 MRN: [id number] Attending MD: Wilhemina Bonito. Marina Goodell , MD, [id number] Date of Birth: [birth year] CSN: [id number] Age: 67 Admit Type: Outpatient Procedure:                Upper GI endoscopy Indications:              Esophageal reflux, Cirrhosis (MASH) rule out                            esophageal varices?"screening Providers:                Wilhemina Bonito. Marina Goodell, MD, Stephens Shire RN, RN, Kandice Robinsons, Technician Referring MD:             Rodman Key, MD Medicines:                Monitored Anesthesia Care Complications:            No immediate complications. Estimated Blood Loss:     Estimated blood loss: none. Procedure:                Pre-Anesthesia Assessment:                           - Prior to the procedure, a History and Physical                            was performed, and patient medications and                            allergies were reviewed. The patient's tolerance of                            previous anesthesia was also reviewed. The risks                            and benefits of the procedure and the sedation                            options and risks were discussed with the patient.                            All questions were answered, and informed consent                            was obtained. Prior Anticoagulants: The patient has                            taken no anticoagulant or antiplatelet agents. ASA                            Grade Assessment: III - A patient with severe  systemic disease. After reviewing the risks and                            benefits, the patient was deemed in satisfactory                            condition to undergo the procedure.                           After obtaining informed consent, the endoscope was                            passed under direct vision. Throughout the                             procedure, the patient's blood pressure, pulse, and                            oxygen saturations were monitored continuously. The                            [id number]-[id number] ([id number]) Olympus endoscope was introduced                            through the mouth, and advanced to the second part                            of duodenum. The upper GI endoscopy was                            accomplished without difficulty. The patient                            tolerated the procedure well. Scope In: Scope Out: Findings:      The esophagus was normal. No varices.      The stomach was normal, save hiatal hernia and a few small fundic gland       polyps..      The examined duodenum was normal.      The cardia and gastric fundus were normal on retroflexion. Impression:               - Normal esophagus. No varices                           - Normal stomach, except for small hiatal hernia                            and benign fundic gland polyps. No varices.                           - Normal examined duodenum.                           - No specimens collected. Moderate Sedation:      none Recommendation:           -  Patient has a contact number available for                            emergencies. The signs and symptoms of potential                            delayed complications were discussed with the                            patient. Return to normal activities tomorrow.                            Written discharge instructions were provided to the                            patient.                           - Resume previous diet.                           - Continue present medications.                           - Repeat EGD for variceal screening and 2-3 years Procedure Code(s):        --- Professional ---                           [phone], Esophagogastroduodenoscopy, flexible,                            transoral; diagnostic, including collection of                            specimen(s)  by brushing or washing, when performed                            (separate procedure) Diagnosis Code(s):        --- Professional ---                           K21.9, Gastro-esophageal reflux disease without                            esophagitis                           K74.60, Unspecified cirrhosis of liver CPT copyright 2022 American [employer] Association. All rights reserved. The codes documented in this report are preliminary and upon coder review may  be revised to meet current compliance requirements. Wilhemina Bonito. Marina Goodell, MD 05/16/2023 9:55:18 AM This report has been signed electronically. Number of Addenda: 0

## 2023-05-17 ENCOUNTER — Encounter (HOSPITAL_COMMUNITY): Payer: Self-pay | Admitting: Internal Medicine

## 2023-05-17 DIAGNOSIS — M1711 Unilateral primary osteoarthritis, right knee: Secondary | ICD-10-CM | POA: Diagnosis not present

## 2023-05-24 DIAGNOSIS — J00 Acute nasopharyngitis [common cold]: Secondary | ICD-10-CM | POA: Diagnosis not present

## 2023-05-31 DIAGNOSIS — M1711 Unilateral primary osteoarthritis, right knee: Secondary | ICD-10-CM | POA: Diagnosis not present

## 2023-06-14 ENCOUNTER — Telehealth: Payer: Self-pay | Admitting: Cardiology

## 2023-06-14 DIAGNOSIS — R600 Localized edema: Secondary | ICD-10-CM | POA: Diagnosis not present

## 2023-06-14 DIAGNOSIS — E876 Hypokalemia: Secondary | ICD-10-CM | POA: Diagnosis not present

## 2023-06-14 DIAGNOSIS — Z6841 Body Mass Index (BMI) 40.0 and over, adult: Secondary | ICD-10-CM | POA: Diagnosis not present

## 2023-06-14 DIAGNOSIS — R079 Chest pain, unspecified: Secondary | ICD-10-CM | POA: Diagnosis not present

## 2023-06-14 DIAGNOSIS — R300 Dysuria: Secondary | ICD-10-CM | POA: Diagnosis not present

## 2023-06-14 NOTE — Telephone Encounter (Signed)
 Patient reports right-sided chest pain over the last 2-3 weeks. She states it comes and goes and usually on lasts a few seconds, however in the last 2 weeks she had episodes that lasted for a few minutes. She took a nitroglycerin  tablet twice in the last 2 weeks with relief of chest pain.  Patient also reports swelling from above her knees to her waist. She states swelling came on overnight and is hard. She reports she did receive an injection in her knee 2 weeks ago.  In 2 weeks weight has increased from 281 lbs to 299 lbs today.  Patient states she limits her salt intake to about 1,000mg  daily. No increased salt in diet to attribute weight gain/swelling to.  Denies any SOB. She went to [facility] for assessment of swelling. No redness, no rash, no itching. An EKG was performed and patient states she was told she did not have A-Fib but EKG showed an irregular heart rhythm and was encouraged to reach out to Dr. Godfrey office.  Requested EKG be faxed to our office at [phone] for Dr. Ladona to review.   Provider at [facility] instructed patient to take torsemide  20mg  for swelling. Patient had not taken yet at time of call.  Urine culture pending for suspected UTI.  Will forward to Dr. Ladona to review and advise when EKG is received.  Advised on ED precautions for CP, patient verbalized understanding.

## 2023-06-14 NOTE — Telephone Encounter (Signed)
 Left message for patient informing her Dr. Jacinto Halim recommends she come in for evaluation of symptoms. Scheduled appt with DOD Dr. Wyline Mood on 06/21/23 at 4:20PM.   Provided office number for callback if any questions or need to reschedule.

## 2023-06-14 NOTE — Telephone Encounter (Signed)
 She needs an appointment to be evaluated for her symptoms, hard to make out

## 2023-06-14 NOTE — Telephone Encounter (Signed)
 Pt c/o of Chest Pain: STAT if CP now or developed within 24 hours  1. Are you having CP right now? No   2. Are you experiencing any other symptoms (ex. SOB, nausea, vomiting, sweating)? Swelling   3. How long have you been experiencing CP? 3 weeks  4. Is your CP continuous or coming and going? Coming and going   5. Have you taken Nitroglycerin ? Yes, 2 two weeks ago.  ?   Patient states that she was just seen at [facility] and they did an EKG and wanted patient to call cardiologist to discuss this further to see what it is that she may need to do next or if there is a sooner appt for patient. Pt states that she seems to be having irregular heart beat. Please advise.

## 2023-06-15 ENCOUNTER — Other Ambulatory Visit: Payer: Self-pay | Admitting: Cardiology

## 2023-06-15 DIAGNOSIS — I951 Orthostatic hypotension: Secondary | ICD-10-CM

## 2023-06-21 ENCOUNTER — Ambulatory Visit: Payer: PPO | Attending: Internal Medicine | Admitting: Internal Medicine

## 2023-06-21 VITALS — BP 110/60 | HR 71 | Ht 68.0 in | Wt 284.0 lb

## 2023-06-21 DIAGNOSIS — R600 Localized edema: Secondary | ICD-10-CM

## 2023-06-21 DIAGNOSIS — I10 Essential (primary) hypertension: Secondary | ICD-10-CM | POA: Diagnosis not present

## 2023-06-21 DIAGNOSIS — I209 Angina pectoris, unspecified: Secondary | ICD-10-CM | POA: Diagnosis not present

## 2023-06-21 DIAGNOSIS — N1831 Chronic kidney disease, stage 3a: Secondary | ICD-10-CM | POA: Diagnosis not present

## 2023-06-21 DIAGNOSIS — I351 Nonrheumatic aortic (valve) insufficiency: Secondary | ICD-10-CM | POA: Diagnosis not present

## 2023-06-21 NOTE — Patient Instructions (Addendum)
Medication Instructions:  No changes  *If you need a refill on your cardiac medications before your next appointment, please call your pharmacy*   Lab Work: BNP BMET In 2 weeks  If you have labs (blood work) drawn today and your tests are completely normal, you will receive your results only by: MyChart Message (if you have MyChart) OR A paper copy in the mail If you have any lab test that is abnormal or we need to change your treatment, we will call you to review the results.   Testing/Procedures: Steffanie Dunn Your physician has requested that you have a lexiscan myoview. For further information please visit [email or web address] Please follow instruction sheet, as given.    Echo next available  Your physician has requested that you have an echocardiogram. Echocardiography is a painless test that uses sound waves to create images of your heart. It provides your doctor with information about the size and shape of your heart and how well your heart's chambers and valves are working. This procedure takes approximately one hour. There are no restrictions for this procedure. Please do NOT wear cologne, perfume, aftershave, or lotions (deodorant is allowed). Please arrive 15 minutes prior to your appointment time.  Please note: We ask at that you not bring children with you during ultrasound (echo/ vascular) testing. Due to room size and safety concerns, children are not allowed in the ultrasound rooms during exams. Our front office staff cannot provide observation of children in our lobby area while testing is being conducted. An adult accompanying a patient to their appointment will only be allowed in the ultrasound room at the discretion of the ultrasound technician under special circumstances. We apologize for any inconvenience.  Follow-Up: At [facility], you and your health needs are our priority.  As part of our continuing mission to provide you with exceptional heart care, we  have created designated Provider Care Teams.  These Care Teams include your primary Cardiologist (physician) and Advanced Practice Providers (APPs -  Physician Assistants and Nurse Practitioners) who all work together to provide you with the care you need, when you need it.    Your next appointment:   Already has an appointment   Provider:   [facility]     Other Instructions

## 2023-06-21 NOTE — Progress Notes (Signed)
  Cardiology Office Note:  .   Date:  06/21/2023  ID:  Joanna Alvarado, DOB 28-Jun-1955, MRN [id number] PCP: Marylen Ponto, MD  [facility] Providers Cardiologist:  None    History of Present Illness: .   Joanna Alvarado is a 68 y.o. female she is a patient of Dr. Jacinto Halim.  She has a history of a bioprosthetic aortic valve replacement 11/24/2021 2/2 severe AI, chronic thrombocytopenia, morbid obesity, chronic stable angina with a cath in June 2023 that showed showed no significant coronary disease except for the ostium of D1 85% focal stenosis.   Today she comes in a for an acute visit. She noted a lot of fluid  build up overnight. Sh was started on torsemide 20 mg daily. She can't lay flat in the bed due to localized pain along her mediansternotomy scar. She also had a UTI.  She was asked if she was having any chest pain and she noted that she had  mild sharp pain. She has needed to take SL nitroglycerin. She also had radiation down her arms. This occurred last week. It lasted for a couple of days.  She said this is not like her typical chronic angina. Otherwise the torsemide has reduced her edema.  Her weight is down almost 20 pounds.  Wt Readings from Last 3 Encounters:  06/21/23 284 lb (128.8 kg)  05/16/23 (!) 306 lb (138.8 kg)  04/17/23 (!) 306 lb (138.8 kg)      ROS:  per HPI otherwise negative   Studies Reviewed: Marland Kitchen        EKG Interpretation Date/Time:  Thursday June 21 2023 16:23:17 EST Ventricular Rate:  71 PR Interval:  160 QRS Duration:  128 QT Interval:  464 QTC Calculation: 504 R Axis:   -31  Text Interpretation: Sinus rhythm with Premature supraventricular complexes Left axis deviation Left ventricular hypertrophy with QRS widening and repolarization abnormality ( R in aVL , Cornell product ) Cannot rule out Septal infarct (cited on or before 21-Jun-2023) When compared with ECG of 29-Nov-2022 11:13, Questionable change in initial forces of Anterior leads  Confirmed by Carolan Clines ([id number]) on 06/21/2023 4:28:51 PM  Risk Assessment/Calculations:        Physical Exam:   VS:   Vitals:   06/21/23 1616  BP: 110/60  Pulse: 71  SpO2: 97%    Wt Readings from Last 3 Encounters:  05/16/23 (!) 306 lb (138.8 kg)  04/17/23 (!) 306 lb (138.8 kg)  02/06/23 295 lb (133.8 kg)    GEN: Well nourished, well developed in no acute distress NECK: No JVD; CARDIAC: RRR, no murmurs, rubs, gallops RESPIRATORY:  Clear to auscultation without rales, wheezing or rhonchi  ABDOMEN: Soft, non-tender, non-distended EXTREMITIES:  No edema; No deformity   ASSESSMENT AND PLAN: .   CP She was noted to have chronic angina. She has known CAD at the ostium of D1.  She states that this chest pain is different from her chronic angina.  I will obtain a lexiscan SPECT   ?HFpEF Challenging to exam with her size.  She is euvolemic on exam.  Her weight has come down with 20 pounds.  This was possibly related to new onset HFpEF. - continue torsemide 20 mg daily - repeat TTE -BNP/BMET in 2 weeks.       Dispo:  Follow up with Dr. Jacinto Halim  Signed, Elliotte Marsalis, Alben Spittle, MD

## 2023-06-22 ENCOUNTER — Encounter: Payer: Self-pay | Admitting: Cardiology

## 2023-06-25 ENCOUNTER — Telehealth (HOSPITAL_COMMUNITY): Payer: Self-pay | Admitting: *Deleted

## 2023-06-25 NOTE — Telephone Encounter (Signed)
Patient given detailed instructions per Myocardial Perfusion Study Information Sheet for the test on 07/02/2023 at 11:00. Patient notified to arrive 15 minutes early and that it is imperative to arrive on time for appointment to keep from having the test rescheduled.  If you need to cancel or reschedule your appointment, please call the office within 24 hours of your appointment. . Patient verbalized understanding.Joanna Alvarado

## 2023-06-27 ENCOUNTER — Other Ambulatory Visit (HOSPITAL_COMMUNITY): Payer: PPO

## 2023-06-29 ENCOUNTER — Encounter: Payer: Self-pay | Admitting: Internal Medicine

## 2023-06-29 ENCOUNTER — Ambulatory Visit (HOSPITAL_COMMUNITY)
Admission: RE | Admit: 2023-06-29 | Discharge: 2023-06-29 | Disposition: A | Discharge status: Home / Self Care | Payer: PPO | Source: Ambulatory Visit | Attending: Internal Medicine | Admitting: Internal Medicine

## 2023-06-29 DIAGNOSIS — I129 Hypertensive chronic kidney disease with stage 1 through stage 4 chronic kidney disease, or unspecified chronic kidney disease: Secondary | ICD-10-CM | POA: Diagnosis not present

## 2023-06-29 DIAGNOSIS — Z952 Presence of prosthetic heart valve: Secondary | ICD-10-CM | POA: Insufficient documentation

## 2023-06-29 DIAGNOSIS — R600 Localized edema: Secondary | ICD-10-CM | POA: Diagnosis not present

## 2023-06-29 DIAGNOSIS — N1831 Chronic kidney disease, stage 3a: Secondary | ICD-10-CM | POA: Insufficient documentation

## 2023-06-29 DIAGNOSIS — I209 Angina pectoris, unspecified: Secondary | ICD-10-CM | POA: Diagnosis not present

## 2023-06-29 DIAGNOSIS — I351 Nonrheumatic aortic (valve) insufficiency: Secondary | ICD-10-CM

## 2023-06-29 DIAGNOSIS — I10 Essential (primary) hypertension: Secondary | ICD-10-CM | POA: Diagnosis not present

## 2023-06-29 LAB — ECHOCARDIOGRAM COMPLETE
AR max vel: 1.38 cm2
AV Area VTI: 1.5 cm2
AV Area mean vel: 1.35 cm2
AV Mean grad: 13 mm[Hg]
AV Peak grad: 18.5 mm[Hg]
Ao pk vel: 2.15 m/s
Area-P 1/2: 7.37 cm2
Calc EF: 48.7 %
Est EF: 55
MV VTI: 2.14 cm2
S' Lateral: 3.4 cm
Single Plane A2C EF: 51.1 %
Single Plane A4C EF: 48.3 %

## 2023-06-29 NOTE — Progress Notes (Signed)
*  PRELIMINARY RESULTS* Echocardiogram 2D Echocardiogram has been performed.  Joanna Alvarado 06/29/2023, 11:56 AM

## 2023-07-02 ENCOUNTER — Ambulatory Visit (HOSPITAL_COMMUNITY): Payer: PPO | Attending: Internal Medicine

## 2023-07-02 ENCOUNTER — Telehealth: Payer: Self-pay | Admitting: Internal Medicine

## 2023-07-02 DIAGNOSIS — N1831 Chronic kidney disease, stage 3a: Secondary | ICD-10-CM | POA: Diagnosis not present

## 2023-07-02 DIAGNOSIS — I351 Nonrheumatic aortic (valve) insufficiency: Secondary | ICD-10-CM | POA: Insufficient documentation

## 2023-07-02 DIAGNOSIS — I209 Angina pectoris, unspecified: Secondary | ICD-10-CM | POA: Diagnosis not present

## 2023-07-02 DIAGNOSIS — R600 Localized edema: Secondary | ICD-10-CM | POA: Diagnosis not present

## 2023-07-02 DIAGNOSIS — I10 Essential (primary) hypertension: Secondary | ICD-10-CM | POA: Diagnosis not present

## 2023-07-02 MED ORDER — TECHNETIUM TC 99M TETROFOSMIN IV KIT
31.6000 | PACK | Freq: Once | INTRAVENOUS | Status: AC | PRN
  Administered 2023-07-02: 31.6 via INTRAVENOUS

## 2023-07-02 MED ORDER — REGADENOSON 0.4 MG/5ML IV SOLN
0.4000 mg | Freq: Once | INTRAVENOUS | Status: AC
  Administered 2023-07-02: 0.4 mg via INTRAVENOUS

## 2023-07-02 NOTE — Telephone Encounter (Signed)
Line rings busy will attempt later.

## 2023-07-02 NOTE — Telephone Encounter (Signed)
Patient called and stated that she is not sure if she is having spasm or diverticulitis pain. Patient stated that she gets pain underneath her strum and near the top of her abdomen. Patient states her pain last about 20 sec on and off. Patient is requesting a call back. Please advise. Patient is requesting a call back to her husband phone due to her leaving her phone at home.

## 2023-07-03 ENCOUNTER — Ambulatory Visit (HOSPITAL_COMMUNITY): Payer: PPO | Attending: Internal Medicine

## 2023-07-03 LAB — MYOCARDIAL PERFUSION IMAGING
LV dias vol: 99 mL (ref 46–106)
LV sys vol: 45 mL
Nuc Stress EF: 55 %
Peak HR: 100 {beats}/min
Rest HR: 74 {beats}/min
Rest Nuclear Isotope Dose: 31.6 mCi
SDS: 0
SRS: 2
SSS: 2
ST Depression (mm): 0 mm
Stress Nuclear Isotope Dose: 31.6 mCi
TID: 1.01

## 2023-07-03 MED ORDER — TECHNETIUM TC 99M TETROFOSMIN IV KIT
31.6000 | PACK | Freq: Once | INTRAVENOUS | Status: AC | PRN
Start: 2023-07-03 — End: 2023-07-03
  Administered 2023-07-03: 31.6 via INTRAVENOUS

## 2023-07-04 ENCOUNTER — Encounter: Payer: Self-pay | Admitting: Internal Medicine

## 2023-07-04 NOTE — Telephone Encounter (Signed)
Spoke with Joanna Alvarado and she reports that Saturday evening she had terrible pain in the middle of her chest right below her sternum. States she would have pain for about and only have about 20 seconds rest before the next wave of pain came. She felt warm to touch but they did not take her temp. Joanna Alvarado said this went on for quite a while on Saturday evening. On Sunday the pain was gone but the area under her sternum was still tender to touch. She did not eat much at all Sunday. She wanted to let Dr. Marina Goodell know and see is he has any recommendations regarding the symptoms she experienced.

## 2023-07-04 NOTE — Telephone Encounter (Signed)
I am not sure what is causing her symptoms.  I am not sure that it is GI. She had an upper endoscopy 1 month ago which was unremarkable. She is on a PPI. I would recommend that she reach out to her cardiologist and/or PCP regarding her symptoms, ASAP. Thanks, Dr. Marina Goodell

## 2023-07-04 NOTE — Telephone Encounter (Signed)
Spoke with pt and she is aware of Dr. Lamar Sprinkles recommendations. Pt has an appt with her PCP tomorrow and will keep that as scheduled.

## 2023-07-05 DIAGNOSIS — I10 Essential (primary) hypertension: Secondary | ICD-10-CM | POA: Diagnosis not present

## 2023-07-05 DIAGNOSIS — N1831 Chronic kidney disease, stage 3a: Secondary | ICD-10-CM | POA: Diagnosis not present

## 2023-07-05 DIAGNOSIS — R600 Localized edema: Secondary | ICD-10-CM | POA: Diagnosis not present

## 2023-07-05 DIAGNOSIS — I209 Angina pectoris, unspecified: Secondary | ICD-10-CM | POA: Diagnosis not present

## 2023-07-05 DIAGNOSIS — I351 Nonrheumatic aortic (valve) insufficiency: Secondary | ICD-10-CM | POA: Diagnosis not present

## 2023-07-06 ENCOUNTER — Encounter: Payer: Self-pay | Admitting: Internal Medicine

## 2023-07-06 DIAGNOSIS — Z6841 Body Mass Index (BMI) 40.0 and over, adult: Secondary | ICD-10-CM | POA: Diagnosis not present

## 2023-07-06 DIAGNOSIS — G4700 Insomnia, unspecified: Secondary | ICD-10-CM | POA: Diagnosis not present

## 2023-07-06 DIAGNOSIS — R300 Dysuria: Secondary | ICD-10-CM | POA: Diagnosis not present

## 2023-07-06 LAB — BASIC METABOLIC PANEL
BUN/Creatinine Ratio: 14 (ref 12–28)
BUN: 13 mg/dL (ref 8–27)
CO2: 24 mmol/L (ref 20–29)
Calcium: 9.4 mg/dL (ref 8.7–10.3)
Chloride: 111 mmol/L — ABNORMAL HIGH (ref 96–106)
Creatinine, Ser: 0.9 mg/dL (ref 0.57–1.00)
Glucose: 89 mg/dL (ref 70–99)
Potassium: 3.7 mmol/L (ref 3.5–5.2)
Sodium: 146 mmol/L — ABNORMAL HIGH (ref 134–144)
eGFR: 70 mL/min/{1.73_m2} (ref >59)

## 2023-07-06 LAB — BRAIN NATRIURETIC PEPTIDE: BNP: 39.9 pg/mL (ref 0.0–100.0)

## 2023-07-09 ENCOUNTER — Encounter: Payer: Self-pay | Admitting: Cardiology

## 2023-07-09 ENCOUNTER — Ambulatory Visit: Payer: PPO | Attending: Cardiology | Admitting: Cardiology

## 2023-07-09 VITALS — BP 104/58 | HR 76 | Resp 16 | Ht 68.0 in | Wt 298.4 lb

## 2023-07-09 DIAGNOSIS — R300 Dysuria: Secondary | ICD-10-CM | POA: Diagnosis not present

## 2023-07-09 DIAGNOSIS — I5032 Chronic diastolic (congestive) heart failure: Secondary | ICD-10-CM | POA: Diagnosis not present

## 2023-07-09 DIAGNOSIS — I951 Orthostatic hypotension: Secondary | ICD-10-CM

## 2023-07-09 DIAGNOSIS — R0609 Other forms of dyspnea: Secondary | ICD-10-CM | POA: Diagnosis not present

## 2023-07-09 DIAGNOSIS — I209 Angina pectoris, unspecified: Secondary | ICD-10-CM | POA: Diagnosis not present

## 2023-07-09 MED ORDER — SPIRONOLACTONE 25 MG PO TABS: 25.0000 mg | ORAL_TABLET | ORAL | 2 refills | Status: DC

## 2023-07-09 NOTE — Patient Instructions (Signed)
Medication Instructions:  Your physician has recommended you make the following change in your medication: Start spironolactone 25 mg by mouth daily   *If you need a refill on your cardiac medications before your next appointment, please call your pharmacy*   Lab Work: Have lab work done at [facility] in 2-3 weeks.  BMP.  This is not fasting.  There is an office on the first floor of our building If you have labs (blood work) drawn today and your tests are completely normal, you will receive your results only by: MyChart Message (if you have MyChart) OR A paper copy in the mail If you have any lab test that is abnormal or we need to change your treatment, we will call you to review the results.   Testing/Procedures: none   Follow-Up: At [facility], you and your health needs are our priority.  As part of our continuing mission to provide you with exceptional heart care, we have created designated Provider Care Teams.  These Care Teams include your primary Cardiologist (physician) and Advanced Practice Providers (APPs -  Physician Assistants and Nurse Practitioners) who all work together to provide you with the care you need, when you need it.  We recommend signing up for the patient portal called "MyChart".  Sign up information is provided on this After Visit Summary.  MyChart is used to connect with patients for Virtual Visits (Telemedicine).  Patients are able to view lab/test results, encounter notes, upcoming appointments, etc.  Non-urgent messages can be sent to your provider as well.   To learn more about what you can do with MyChart, go to [email or web address].    Your next appointment:   6 month(s)  Provider:   Yates Decamp, MD     Other Instructions

## 2023-07-09 NOTE — Progress Notes (Signed)
Cardiology Office Note:  .   Date:  07/09/2023  ID:  Joanna Alvarado, DOB Dec 12, 1955, MRN [id number] PCP: Marylen Ponto, MD  [facility] Providers Cardiologist:  Yates Decamp, MD   History of Present Illness: .   Joanna Alvarado is a 68 y.o. female with a past medical history of hypertension, severe aortic regurgitation S/P BIOPROSTHETIC AV replacement 11/24/2021, chronic thrombocytopenia, morbid obesity and OSA on CPAP, GERD, hiatal hernia, hypercholesterolemia, hypothyroidism and chronic stable angina with normal coronary arteries/minimal coronary artery disease by angiography in April 2021, orthostatic hypotension.   She was seen by my partner on 06/21/2023 as an acute visit with worsening dyspnea, fluid retention, chest pain, was recommended echocardiogram and stress test.  She now presents for follow-up.  Discussed the use of AI scribe software for clinical note transcription with the patient, who gave verbal consent to proceed.  History of Present Illness   The patient presents with constipation and fluid retention.  She experiences severe constipation, having had only one bowel movement in the past twenty days despite using multiple medications including lactulose, MiraLax, Linzess 290 mg, and three Dulcolax tablets without relief.  Significant swelling is noted, particularly in her buttocks and legs, with her weight decreasing to 271 pounds after taking torsemide but subsequently increasing to 298 pounds. She uses torsemide 20 mg as needed for swelling, having taken it five times in one week recently. She is not currently taking potassium supplements.No PND or orthopnea.   Labs   Lab Results  Component Value Date   CHOL 119 09/30/2019   HDL 37 (L) 09/30/2019   LDLCALC 63 09/30/2019   TRIG 103 09/30/2019   CHOLHDL 3.2 09/30/2019   Lab Results  Component Value Date   NA 146 (H) 07/05/2023   K 3.7 07/05/2023   CO2 24 07/05/2023   GLUCOSE 89 07/05/2023   BUN 13  07/05/2023   CREATININE 0.90 07/05/2023   CALCIUM 9.4 07/05/2023   GFR 78.02 04/17/2023   EGFR 70 07/05/2023   GFRNONAA >60 11/29/2022      Latest Ref Rng & Units 07/05/2023    1:06 PM 04/17/2023   10:16 AM 04/08/2023   11:44 AM  BMP  Glucose 70 - 99 mg/dL 89  83    BUN 8 - 27 mg/dL 13  12    Creatinine 8.29 - 1.00 mg/dL 5.62  1.30  8.65   BUN/Creat Ratio 12 - 28 14     Sodium 134 - 144 mmol/L 146  140    Potassium 3.5 - 5.2 mmol/L 3.7  3.3    Chloride 96 - 106 mmol/L 111  110    CO2 20 - 29 mmol/L 24  24    Calcium 8.7 - 10.3 mg/dL 9.4  9.3        Latest Ref Rng & Units 04/17/2023   10:16 AM 11/29/2022   10:54 AM 10/16/2022   12:00 AM  CBC  WBC 4.0 - 10.5 K/uL 4.6  3.8  5.0      Hemoglobin 12.0 - 15.0 g/dL 78.4  69.6  29.5      Hematocrit 36.0 - 46.0 % 36.8  39.3  42      Platelets 150.0 - 400.0 K/uL 68.0  61  86         This result is from an external source.   BNP (last 3 results) Recent Labs    07/05/23 1306  BNP 39.9   Review of Systems  Cardiovascular:  Positive for dyspnea on exertion and leg swelling. Negative for chest pain.  Gastrointestinal:  Positive for constipation.   Physical Exam:   VS:  BP (!) 104/58 (BP Location: Left Arm, Patient Position: Sitting, Cuff Size: Large)   Pulse 76   Resp 16   Ht 5\' 8"  (1.727 m)   Wt 298 lb 6.4 oz (135.4 kg)   SpO2 98%   BMI 45.37 kg/m    Wt Readings from Last 3 Encounters:  07/09/23 298 lb 6.4 oz (135.4 kg)  07/02/23 284 lb (128.8 kg)  06/21/23 284 lb (128.8 kg)    Orthostatic Vitals for the past 48 hrs (Last 6 readings):  Patient Position BP Pulse BP Location Cuff Size  07/09/23 1518 Sitting (!) 104/58 76 Left Arm Large   Physical Exam Constitutional:      Comments: Morbidly obese in no acute distress.  Neck:     Vascular: No carotid bruit or JVD.  Cardiovascular:     Rate and Rhythm: Normal rate and regular rhythm.     Pulses:          Dorsalis pedis pulses are 1+ on the right side and 1+ on the  left side.     Heart sounds: Normal heart sounds. No murmur heard.    No gallop.  Pulmonary:     Effort: Pulmonary effort is normal.     Breath sounds: Normal breath sounds.  Abdominal:     General: Bowel sounds are normal.     Palpations: Abdomen is soft.     Comments: Obese. Large Pannus present  Musculoskeletal:     Right lower leg: No edema.     Left lower leg: No edema.    Studies Reviewed: .    MYOCARDIAL PERFUSION IMAGING 07/03/2023   Findings are consistent with no ischemia and no infarction. The study is low risk.   No ST deviation was noted.   LV perfusion is normal. There is no evidence of ischemia. There is no evidence of infarction.   Left ventricular function is abnormal. There were multiple regional abnormalities. Calculated nuclear stress EF: 55%. The left ventricular ejection fraction is normal (55-65%).  However, visually wall motion is abnormal.  End diastolic cavity size is normal. End systolic cavity size is normal.   Prior study not available for comparison.  ECHOCARDIOGRAM COMPLETE 06/29/2023  1. Left ventricular ejection fraction, by estimation, is 55%. The left ventricle has normal function. The left ventricle has no regional wall motion abnormalities. There is mild concentric left ventricular hypertrophy. Left ventricular diastolic parameters are consistent with Grade I diastolic dysfunction (impaired relaxation). 2. Right ventricular systolic function is normal. The right ventricular size is normal. There is normal pulmonary artery systolic pressure. The estimated right ventricular systolic pressure is 29.4 mmHg. 3. Left atrial size was mildly dilated. 4. Right atrial size was mildly dilated. 5. The mitral valve is normal in structure. Trivial mitral valve regurgitation. No evidence of mitral stenosis. 6. There is a bioprosthetic aortic valve. Mean gradient 13 mmHg with no significant regurgitation noted. 7. Aortic dilatation noted. There is borderline  dilatation of the ascending aorta, measuring 37 mm. 8. The inferior vena cava is normal in size with greater than 50% respiratory variability, suggesting right atrial pressure of 3 mmHg.  EKG:    Medications and allergies    Allergies  Allergen Reactions   Adhesive [Tape]     Latex, band aids  causes whelts and blisters   Dermabond causes, welts   Psyllium  Other (See Comments)    Severe constipation   Trulicity [Dulaglutide]     KIDNEY INFECTION,INABILITY TO HAVE BOWEL MOVEMENTS,BLOOD IN URINE   Hydrocodone-Acetaminophen Itching    Tolerates small/ infrequent doses   Latex Other (See Comments)    Band-aids causes whelts and blisters   Oxycontin [Oxycodone Hcl] Other (See Comments)    "FELT LIKE HEAD WAS GOING TO EXPLODE" in head   Ciprofloxacin Rash    Yeast infection and a severe rash    Codeine Itching and Rash   Other Palpitations    Steroids      "makes me feel like I'm having a heart attack. -     Current Outpatient Medications:    acetaminophen (TYLENOL) 500 MG tablet, Take 1,000 mg by mouth every 6 (six) hours as needed for moderate pain., Disp: , Rfl:    albuterol (VENTOLIN HFA) 108 (90 Base) MCG/ACT inhaler, Inhale 2 puffs into the lungs every 4 (four) hours as needed for wheezing or shortness of breath., Disp: , Rfl:    ciprofloxacin (CIPRO) 250 MG tablet, Take 250 mg by mouth 2 (two) times daily., Disp: , Rfl:    ergocalciferol (VITAMIN D2) 1.25 MG (50000 UT) capsule, Take 50,000 Units by mouth every Tuesday., Disp: , Rfl:    fludrocortisone (FLORINEF) 0.1 MG tablet, TAKE ONE TABLET BY MOUTH EVERY EVENING, Disp: 30 tablet, Rfl: 11   lactulose (CHRONULAC) 10 GM/15ML solution, Take 15-30 ml up to twice daily for constipation, Disp: 1800 mL, Rfl: 1   levothyroxine (SYNTHROID) 150 MCG tablet, Take 150 mcg by mouth daily before breakfast., Disp: , Rfl:    lidocaine (LIDODERM) 5 %, Place 1 patch onto the skin daily as needed (Knee pain). Remove & Discard patch within 12  hours or as directed by MD, Disp: , Rfl:    linaclotide (LINZESS) 290 MCG CAPS capsule, Take 1 capsule (290 mcg total) by mouth daily before breakfast., Disp: , Rfl:    metoprolol tartrate (LOPRESSOR) 25 MG tablet, Take 1 tablet (25 mg total) by mouth 3 (three) times daily as needed (Palpitations)., Disp: 30 tablet, Rfl: 2   nitroGLYCERIN (NITROSTAT) 0.4 MG SL tablet, Place 1 tablet (0.4 mg total) under the tongue every 5 (five) minutes as needed for chest pain., Disp: 25 tablet, Rfl: 2   omeprazole (PRILOSEC) 40 MG capsule, Take 40 mg by mouth daily., Disp: , Rfl:    OVER THE COUNTER MEDICATION, Take 2 tablets by mouth in the morning. [facility], Disp: , Rfl:    polyethylene glycol (MIRALAX / GLYCOLAX) 17 g packet, Take 17 g by mouth daily as needed for mild constipation or moderate constipation., Disp: , Rfl:    rosuvastatin (CRESTOR) 5 MG tablet, TAKE ONE TABLET BY MOUTH DAILY, Disp: 90 tablet, Rfl: 3   spironolactone (ALDACTONE) 25 MG tablet, Take 1 tablet (25 mg total) by mouth every morning., Disp: 30 tablet, Rfl: 2   torsemide (DEMADEX) 20 MG tablet, Take 20 mg by mouth daily as needed (Fluid and weight gain)., Disp: , Rfl:    traMADol (ULTRAM) 50 MG tablet, Take 1 tablet (50 mg total) by mouth every 6 (six) hours as needed for moderate pain. (Patient taking differently: Take 25-50 mg by mouth every 6 (six) hours as needed for moderate pain (pain score 4-6).), Disp: 20 tablet, Rfl: 0   traZODone (DESYREL) 50 MG tablet, Take 50 mg by mouth at bedtime., Disp: , Rfl:    ASSESSMENT AND PLAN: .  ICD-10-CM   1. Dyspnea on exertion  R06.09     2. Chronic diastolic heart failure (HCC)  N82.95 spironolactone (ALDACTONE) 25 MG tablet    Basic Metabolic Panel (BMET)    3. Angina pectoris with normal coronary arteriogram (HCC)  I20.9     4. Orthostatic hypotension  I95.1      Assessment and Plan    Chronic Diastolic Heart Failure Well-managed with no recent hospitalizations.  Prosthetic aortic valve and mild aortic root dilatation remain stable. Emphasized the importance of continuing current heart failure management and routine follow-up. - Continue current heart failure management, presently on metoprolol tartrate 25 mg 3 times daily and torsemide 20 mg on a as needed basis daily. -Add spironolactone 25 mg daily.  Patient is very sensitive to medications. -Obtain BMP in 2 to 3 weeks. - Follow up in 6 months for routine evaluation -Optimization of diastolic heart failure is difficult in view of orthostatic hypotension as well.  Jardiance or Marcelline Deist not an option in view of increased risk for infections.  Fluid Retention Significant swelling in legs and buttocks. Intermittent use of torsemide provides some relief. Patient on a low sodium diet. Discussed adding spironolactone 25 mg once daily as a gentle diuretic, less likely to cause excessive urination compared to torsemide. Continue torsemide as needed. BMP in 2-3 weeks to monitor kidney function and electrolytes. - Prescribe spironolactone 25 mg once daily - Monitor fluid levels and weight - Order BMP in 2-3 weeks to monitor kidney function and electrolytes - Continue torsemide as needed  Chest pain with normal coronary arteries Patient fortunately has not had any further episodes of chest pain since he last saw my partner on 06/21/2023.  I reviewed the results of the echocardiogram which reveals normally functioning prosthetic aortic valve and stress test revealing no evidence of ischemia.  I reassured her.  She has not had any further chest pain.  Office visit in 6 months or sooner if problems.  Signed,  Yates Decamp, MD, [facility] 07/09/2023, 5:14 PM [facility] [address] [place] [address] [place], [place] [address] Phone: [phone]. Fax:  [phone]

## 2023-07-13 ENCOUNTER — Telehealth: Payer: Self-pay | Admitting: Cardiology

## 2023-07-13 NOTE — Telephone Encounter (Signed)
 See other telephone note.

## 2023-07-13 NOTE — Telephone Encounter (Signed)
 Called and spoke with patient.  She states she is taking her spironalactone 25 mg daily, is not taking her prn metoprolol  , is taking the torsemide  on a as needed basis.  She states that she has seen an increase in bil LE edema, has not weighed herself but thinks she has gained weight. States she has hardly been making urine as well and feels like she should be peeing more. When asked why she wasn't taking her metoprolol  she stated that is just as needed as its for chest pressure. I reminded her that it was for her blood pressure and heart rate. Attempted to offer her an office visit today but she states that he has to go to a funeral today and would not be able to come until after 4 pm. Advised I would let Dr Ladona know her symptoms and call her back.

## 2023-07-13 NOTE — Telephone Encounter (Signed)
 Pt c/o medication issue:  1. Name of Medication: spironolactone  (ALDACTONE ) 25 MG tablet   2. How are you currently taking this medication (dosage and times per day)? Take 1 tablet (25 mg total) by mouth every morning.   3. Are you having a reaction (difficulty breathing--STAT)? No   4. What is your medication issue? Patient said that medication is not working and that her legs are both really swollen and cannot walk on them

## 2023-07-13 NOTE — Telephone Encounter (Signed)
 Pt c/o swelling/edema: STAT if pt has developed SOB within 24 hours  If swelling, where is the swelling located? Both legs  How much weight have you gained and in what time span? N/A  Have you gained 2 pounds in a day or 5 pounds in a week? Yes   Do you have a log of your daily weights (if so, list)? No   Are you currently taking a fluid pill? Yes   Are you currently SOB? No   Have you traveled recently in a car or plane for an extended period of time? No

## 2023-07-15 NOTE — Telephone Encounter (Signed)
 She probably should start taking Torsemide  daily for fluid. If not we can schedule her for right heart cath and evaluate her fluid status. Her weight overall is a major contributor to her symptoms.

## 2023-07-16 NOTE — Telephone Encounter (Signed)
 Patient states spironolactone  does not work for her, she took it for 3 days and did not urinate. She reports she swelled up more over the weekend and decided to take her torsemide , she reports she has lost 10 lbs since the weekend and swelling to ankles/knees is gone. She still has swelling in her hips. Instructed her to continue taking torsemide  daily, when swelling has gone down she can resume taking PRN and to let us  know if she continues to need to take torsemide  daily. She will have labs drawn next week to check kidney function/electrolytes.  Patient verbalized understanding of instructions and expressed appreciation for call.

## 2023-07-17 ENCOUNTER — Telehealth: Payer: Self-pay | Admitting: Cardiology

## 2023-07-17 DIAGNOSIS — I5032 Chronic diastolic (congestive) heart failure: Secondary | ICD-10-CM | POA: Diagnosis not present

## 2023-07-17 NOTE — Telephone Encounter (Signed)
Vasovagal episode, to be careful to take hot shower especially when she has lost weight with loss of fluid blood pressure can drop when she is standing up after a hot shower would recommend sitting after taking a hot shower for at least 30 minutes to pull her body prior to standing up for prolonged length of time.

## 2023-07-17 NOTE — Telephone Encounter (Signed)
Spoke with pt who reports an episode of near fainting this morning.  Current BP 103/52.  Pt reports she had just showered and was drying her hair when everything went black and she became sweaty.  Pt reports she sat down.  Lower half of both arms felt numb and tingling.  Hands are pale.  Lower legs were tingling and dark red.  She also felt like she needed to have a bowel movement after.  She sat for a few minutes and drank a bottle of water.  She reports she recently was started on Spironolactone but did not have increased fluid output  Pt was restarted on Torsemide daily and has since lost 15 pounds of fluid since making this change. Pt also takes Florinef 0.1mg  every evening.  Pt states she has not eaten today.  She is currently lying on the bed.  Denies chest pain or SOB. Pt encouraged to continue hydrating and have something salty to help raise blood pressure.  Continue to monitor BP and HR.  Will forward information to Dr Jacinto Halim for review and further recommendation.  Reviewed ED precautions.  Pt verbalizes understanding and agrees with current plan.

## 2023-07-17 NOTE — Telephone Encounter (Signed)
STAT if patient feels like he/she is going to faint   Are you dizzy now? Yes   Do you feel faint or have you passed out? Feels faint   Do you have any other symptoms? Sweating, dizziness and numb arms    Have you checked your HR and BP (record if available)?  90/50 - after almost passing out around 8:50 a.m.  124/115  103/52 - most recent

## 2023-07-17 NOTE — Telephone Encounter (Signed)
Spoke with pt and advised of Dr Verl Dicker message and recommendation.  Pt states she is in [place] office now with her husband for echo testing.  She would like to go ahead and have lab work scheduled for next week drawn today since she is in the office.  Pt advised she may go ahead and have BMET drawn and will make Dr Jacinto Halim aware.  Pt verbalizes understanding and agrees with current plan.

## 2023-07-18 ENCOUNTER — Encounter: Payer: Self-pay | Admitting: Cardiology

## 2023-07-18 DIAGNOSIS — R0609 Other forms of dyspnea: Secondary | ICD-10-CM

## 2023-07-18 DIAGNOSIS — I5032 Chronic diastolic (congestive) heart failure: Secondary | ICD-10-CM

## 2023-07-18 DIAGNOSIS — E876 Hypokalemia: Secondary | ICD-10-CM

## 2023-07-18 LAB — BASIC METABOLIC PANEL
BUN/Creatinine Ratio: 15 (ref 12–28)
BUN: 15 mg/dL (ref 8–27)
CO2: 24 mmol/L (ref 20–29)
Calcium: 8.8 mg/dL (ref 8.7–10.3)
Chloride: 109 mmol/L — ABNORMAL HIGH (ref 96–106)
Creatinine, Ser: 1.02 mg/dL — ABNORMAL HIGH (ref 0.57–1.00)
Glucose: 81 mg/dL (ref 70–99)
Potassium: 3.2 mmol/L — ABNORMAL LOW (ref 3.5–5.2)
Sodium: 146 mmol/L — ABNORMAL HIGH (ref 134–144)
eGFR: 60 mL/min/{1.73_m2} (ref >59)

## 2023-07-18 MED ORDER — POTASSIUM CHLORIDE CRYS ER 20 MEQ PO TBCR: EXTENDED_RELEASE_TABLET | ORAL | 0 refills | Status: DC

## 2023-07-18 NOTE — Telephone Encounter (Signed)
I spoke with patient.  She has not been taking spironolactone.  She stopped it because she did not urinate when taking it.  I told patient I would review with Dr Jacinto Halim and call her back

## 2023-07-18 NOTE — Telephone Encounter (Signed)
Reviewed with Dr Jacinto Halim and patient should follow other previous instructions but start spironolactone back at 25 mg daily.  Patient notified.  She will let us know if she is not urinating after starting spironolactone.  I asked patient to contact office before making any medication changes.

## 2023-07-18 NOTE — Progress Notes (Signed)
Increase Spironolactone to 50 mg daily. Give KCL 20 mEq daily for 1 week only. Recheck BMP in 3 weeks. Stop Torsemide and change fromd aily to PRN again

## 2023-07-18 NOTE — Telephone Encounter (Signed)
-----   Message from Yates Decamp sent at 07/18/2023  4:58 PM EST ----- Increase Spironolactone to 50 mg daily. Give KCL 20 mEq daily for 1 week only. Recheck BMP in 3 weeks. Stop Torsemide and change fromd aily to PRN again

## 2023-07-18 NOTE — Telephone Encounter (Signed)
To keep her from going back into CHF, plese restart Aldo 25 mg daily

## 2023-07-25 ENCOUNTER — Other Ambulatory Visit: Payer: PPO

## 2023-07-31 DIAGNOSIS — M25572 Pain in left ankle and joints of left foot: Secondary | ICD-10-CM | POA: Diagnosis not present

## 2023-07-31 DIAGNOSIS — M79672 Pain in left foot: Secondary | ICD-10-CM | POA: Diagnosis not present

## 2023-08-06 DIAGNOSIS — E041 Nontoxic single thyroid nodule: Secondary | ICD-10-CM | POA: Diagnosis not present

## 2023-08-06 DIAGNOSIS — E039 Hypothyroidism, unspecified: Secondary | ICD-10-CM | POA: Diagnosis not present

## 2023-08-06 DIAGNOSIS — E559 Vitamin D deficiency, unspecified: Secondary | ICD-10-CM | POA: Diagnosis not present

## 2023-08-06 DIAGNOSIS — E119 Type 2 diabetes mellitus without complications: Secondary | ICD-10-CM | POA: Diagnosis not present

## 2023-08-07 ENCOUNTER — Encounter: Payer: Self-pay | Admitting: Cardiology

## 2023-08-17 DIAGNOSIS — M5451 Vertebrogenic low back pain: Secondary | ICD-10-CM | POA: Diagnosis not present

## 2023-09-03 ENCOUNTER — Other Ambulatory Visit: Payer: Self-pay | Admitting: Cardiology

## 2023-09-03 DIAGNOSIS — I5032 Chronic diastolic (congestive) heart failure: Secondary | ICD-10-CM

## 2023-09-06 DIAGNOSIS — M5451 Vertebrogenic low back pain: Secondary | ICD-10-CM | POA: Diagnosis not present

## 2023-09-11 ENCOUNTER — Other Ambulatory Visit (HOSPITAL_COMMUNITY): Payer: Self-pay | Admitting: Specialist

## 2023-09-11 ENCOUNTER — Ambulatory Visit (HOSPITAL_COMMUNITY)
Admission: RE | Admit: 2023-09-11 | Discharge: 2023-09-11 | Disposition: A | Discharge status: Home / Self Care | Source: Ambulatory Visit | Attending: Cardiology | Admitting: Cardiology

## 2023-09-11 DIAGNOSIS — M79605 Pain in left leg: Secondary | ICD-10-CM

## 2023-09-11 DIAGNOSIS — M5451 Vertebrogenic low back pain: Secondary | ICD-10-CM | POA: Diagnosis not present

## 2023-09-14 DIAGNOSIS — R300 Dysuria: Secondary | ICD-10-CM | POA: Diagnosis not present

## 2023-09-20 ENCOUNTER — Ambulatory Visit: Payer: PPO | Admitting: Cardiology

## 2023-09-28 ENCOUNTER — Other Ambulatory Visit: Payer: Self-pay

## 2023-09-28 DIAGNOSIS — Z1231 Encounter for screening mammogram for malignant neoplasm of breast: Secondary | ICD-10-CM | POA: Diagnosis not present

## 2023-09-28 DIAGNOSIS — E7800 Pure hypercholesterolemia, unspecified: Secondary | ICD-10-CM

## 2023-09-28 MED ORDER — ROSUVASTATIN CALCIUM 5 MG PO TABS: 5.0000 mg | ORAL_TABLET | Freq: Every day | ORAL | 3 refills | Status: AC

## 2023-10-03 ENCOUNTER — Telehealth: Payer: Self-pay | Admitting: Cardiology

## 2023-10-03 DIAGNOSIS — Z6841 Body Mass Index (BMI) 40.0 and over, adult: Secondary | ICD-10-CM | POA: Diagnosis not present

## 2023-10-03 DIAGNOSIS — R002 Palpitations: Secondary | ICD-10-CM | POA: Diagnosis not present

## 2023-10-03 DIAGNOSIS — R600 Localized edema: Secondary | ICD-10-CM | POA: Diagnosis not present

## 2023-10-03 NOTE — Telephone Encounter (Signed)
 Agree with your assessment. Unless she joins a diet program and try to get some weight off, she will have significant swings in her weight. She should join wellness program and it will certainly help with her fluid status.  Happy to see her in the clinic

## 2023-10-03 NOTE — Telephone Encounter (Signed)
 Spoke with patient and gave provider recommendations. She states she has been working on her diet and has joined a diet program. So she is unsure why her weight is increasing.  Her mom have an appointment on 5/7 she would like to know if you can see her that day as well.   You are booked

## 2023-10-03 NOTE — Telephone Encounter (Signed)
 Spoke with patient and she went to see PCP today. She would like for you to review the notes from PCP office. She still has swelling and she has gained 45 lbs since march. At last OV in February she was 298 today she is 306 informed patient that is only 8 lbs since February.  Denies any chest pain, Sob, nausea or vomiting.   PCP advised for her to take torsemide  today and if no results take 2 tablets tomorrow. Informed to follow his instructions for now. Monitor weight daily and keep log. If she gain 3 lbs overnight or 5 lbs in a week to give us  a call.

## 2023-10-03 NOTE — Telephone Encounter (Signed)
 Pt c/o swelling/edema: STAT if pt has developed SOB within 24 hours  If swelling, where is the swelling located? Eyes to her toes, legs and under her stomach  is swelling  How much weight have you gained and in what time span?   Have you gained 2 pounds in a day or 5 pounds in a week? 5 to 7 lbs per week  Do you have a log of your daily weights (if so, list)?   Are you currently taking a fluid pill? yes  Are you currently SOB? Yes when she moves around  Have you traveled recently in a car or plane for an extended period of    time?   Patient  said she saw her primary doctor today and he told her to contact her Cardiologist about this swelling

## 2023-10-04 NOTE — Telephone Encounter (Signed)
 I spoke with patient and scheduled her to see Dr Berry Bristol on May 5 at 3:00

## 2023-10-08 ENCOUNTER — Encounter: Payer: Self-pay | Admitting: Cardiology

## 2023-10-08 ENCOUNTER — Ambulatory Visit: Attending: Cardiology | Admitting: Cardiology

## 2023-10-08 ENCOUNTER — Other Ambulatory Visit: Payer: Self-pay

## 2023-10-08 VITALS — BP 112/61 | HR 86 | Resp 16 | Ht 68.0 in | Wt 292.0 lb

## 2023-10-08 DIAGNOSIS — I5033 Acute on chronic diastolic (congestive) heart failure: Secondary | ICD-10-CM | POA: Diagnosis not present

## 2023-10-08 DIAGNOSIS — R0609 Other forms of dyspnea: Secondary | ICD-10-CM

## 2023-10-08 DIAGNOSIS — I872 Venous insufficiency (chronic) (peripheral): Secondary | ICD-10-CM

## 2023-10-08 DIAGNOSIS — I951 Orthostatic hypotension: Secondary | ICD-10-CM

## 2023-10-08 NOTE — Progress Notes (Signed)
 Cardiology Office Note:  .   Date:  10/08/2023  ID:  Joanna Alvarado, DOB 04-10-1956, MRN [id number] PCP: Gaither Juba, MD  [facility] Providers Cardiologist:  Knox Perl, MD   History of Present Illness: .   Joanna Alvarado is a 68 y.o. female with a past medical history of hypertension, severe aortic regurgitation S/P BIOPROSTHETIC AV replacement 11/24/2021, chronic thrombocytopenia, morbid obesity and OSA on CPAP, GERD, hiatal hernia, hypercholesterolemia, hypothyroidism and chronic stable angina with normal coronary arteries/minimal coronary artery disease by angiography in April 2021, and again on 11/08/2021 including right heart catheterization revealing no significant CAD and normal right heart pressure with markedly elevated cardiac output and index probably secondary to obesity and severe aortic regurgitation.    She was seen for worsening dyspnea and leg edema and weight gain and underwent stress testing and echocardiogram in January 2025 which revealed no evidence of ischemia with ejection fraction 55% and echocardiogram revealing grade 1 diastolic dysfunction without evidence of RV strain or RV failure.  She had called our office stating that she has been gaining weight about 45 pounds and wanted to be seen again.  Discussed the use of AI scribe software for clinical note transcription with the patient, who gave verbal consent to proceed.  History of Present Illness Joanna Alvarado is a 68 year old female with chronic diastolic heart failure who presents with worsening edema and fluid retention. She has significant swelling in both legs, her right arm, and face, with severe leg swelling preventing her from lifting one leg. Compression socks are painful. The swelling began in April, with a recent weight loss of 15 pounds over four days following an increase in fluid retention.  She was on spironolactone  with torasemide but discontinued spironolactone  due to ineffectiveness. She  now takes torasemide 40 mg twice daily, which has helped reduce weight and swelling. She also takes fludrocortisone  for orthostatic hypotension. She has orthostatic hypotension and experiences occasional dizziness when walking. She uses a walker for mobility and has not been regularly monitoring her blood pressure at home. She is also on metoprolol  tartrate 25 mg twice daily as needed for heart racing.  She maintains a diet log and uses protein powder as advised by her dietitian. She experiences frequent urinary tract infections and has stage three kidney failure.   Labs   Lab Results  Component Value Date   CHOL 119 09/30/2019   HDL 37 (L) 09/30/2019   LDLCALC 63 09/30/2019   TRIG 103 09/30/2019   CHOLHDL 3.2 09/30/2019   Lab Results  Component Value Date   NA 146 (H) 07/17/2023   K 3.2 (L) 07/17/2023   CO2 24 07/17/2023   GLUCOSE 81 07/17/2023   BUN 15 07/17/2023   CREATININE 1.02 (H) 07/17/2023   CALCIUM  8.8 07/17/2023   GFR 78.02 04/17/2023   EGFR 60 07/17/2023   GFRNONAA >60 11/29/2022      Latest Ref Rng & Units 07/17/2023    1:12 PM 07/05/2023    1:06 PM 04/17/2023   10:16 AM  BMP  Glucose 70 - 99 mg/dL 81  89  83   BUN 8 - 27 mg/dL 15  13  12    Creatinine 0.57 - 1.00 mg/dL 4.09  8.11  9.14   BUN/Creat Ratio 12 - 28 15  14     Sodium 134 - 144 mmol/L 146  146  140   Potassium 3.5 - 5.2 mmol/L 3.2  3.7  3.3   Chloride  96 - 106 mmol/L 109  111  110   CO2 20 - 29 mmol/L 24  24  24    Calcium  8.7 - 10.3 mg/dL 8.8  9.4  9.3       Latest Ref Rng & Units 04/17/2023   10:16 AM 11/29/2022   10:54 AM 10/16/2022   12:00 AM  CBC  WBC 4.0 - 10.5 K/uL 4.6  3.8  5.0      Hemoglobin 12.0 - 15.0 g/dL 16.1  09.6  04.5      Hematocrit 36.0 - 46.0 % 36.8  39.3  42      Platelets 150.0 - 400.0 K/uL 68.0  61  86         This result is from an external source.   Lab Results  Component Value Date   HGBA1C 4.9 11/22/2021    Lab Results  Component Value Date   TSH 0.850  10/13/2021    BNP (last 3 results) Recent Labs    07/05/23 1306  BNP 39.9   ROS  Review of Systems  Cardiovascular:  Positive for dyspnea on exertion (chronic) and leg swelling (R>L). Negative for chest pain, orthopnea and syncope.    Physical Exam:   VS:  BP 112/61 (BP Location: Left Arm, Patient Position: Sitting, Cuff Size: Large)   Pulse 86   Resp 16   Ht 5\' 8"  (1.727 m)   Wt 292 lb (132.5 kg)   SpO2 96%   BMI 44.40 kg/m    Wt Readings from Last 3 Encounters:  10/08/23 292 lb (132.5 kg)  07/09/23 298 lb 6.4 oz (135.4 kg)  07/02/23 284 lb (128.8 kg)    Physical Exam Constitutional:      Comments: Morbidly obese in no acute distress.  Neck:     Vascular: No carotid bruit or JVD.  Cardiovascular:     Rate and Rhythm: Normal rate and regular rhythm.     Pulses: Intact distal pulses.          Carotid pulses are 2+ on the right side and 2+ on the left side.    Heart sounds: Murmur heard.     Midsystolic murmur is present with a grade of 2/6 at the upper right sternal border.     No gallop.  Pulmonary:     Effort: Pulmonary effort is normal.     Breath sounds: Normal breath sounds.  Abdominal:     General: Bowel sounds are normal.     Palpations: Abdomen is soft.     Comments: Obese. Pannus present  Musculoskeletal:     Right lower leg: Edema (trace) present.     Left lower leg: Edema (trace) present.    Studies Reviewed: Aaron Aas    EKG:         Medications and allergies    Allergies  Allergen Reactions   Adhesive [Tape]     Latex, band aids  causes whelts and blisters   Dermabond causes, welts   Psyllium Other (See Comments)    Severe constipation   Trulicity [Dulaglutide]     KIDNEY INFECTION,INABILITY TO HAVE BOWEL MOVEMENTS,BLOOD IN URINE   Hydrocodone -Acetaminophen  Itching    Tolerates small/ infrequent doses   Latex Other (See Comments)    Band-aids causes whelts and blisters   Oxycontin [Oxycodone Hcl] Other (See Comments)    "FELT LIKE HEAD  WAS GOING TO EXPLODE" in head   Ciprofloxacin Rash    Yeast infection and a severe rash    Codeine  Itching and Rash   Other Palpitations    Steroids      "makes me feel like I'm having a heart attack. -     Current Outpatient Medications:    acetaminophen  (TYLENOL ) 500 MG tablet, Take 1,000 mg by mouth every 6 (six) hours as needed for moderate pain., Disp: , Rfl:    albuterol  (VENTOLIN  HFA) 108 (90 Base) MCG/ACT inhaler, Inhale 2 puffs into the lungs every 4 (four) hours as needed for wheezing or shortness of breath., Disp: , Rfl:    ergocalciferol (VITAMIN D2) 1.25 MG (50000 UT) capsule, Take 50,000 Units by mouth every Tuesday., Disp: , Rfl:    lactulose  (CHRONULAC ) 10 GM/15ML solution, Take 15-30 ml up to twice daily for constipation, Disp: 1800 mL, Rfl: 1   levothyroxine  (SYNTHROID ) 150 MCG tablet, Take 150 mcg by mouth daily before breakfast., Disp: , Rfl:    lidocaine  (LIDODERM ) 5 %, Place 1 patch onto the skin daily as needed (Knee pain). Remove & Discard patch within 12 hours or as directed by MD, Disp: , Rfl:    linaclotide  (LINZESS ) 290 MCG CAPS capsule, Take 1 capsule (290 mcg total) by mouth daily before breakfast., Disp: , Rfl:    metoprolol  tartrate (LOPRESSOR ) 25 MG tablet, Take 1 tablet (25 mg total) by mouth 3 (three) times daily as needed (Palpitations)., Disp: 30 tablet, Rfl: 2   nitroGLYCERIN  (NITROSTAT ) 0.4 MG SL tablet, Place 1 tablet (0.4 mg total) under the tongue every 5 (five) minutes as needed for chest pain., Disp: 25 tablet, Rfl: 2   omeprazole (PRILOSEC) 40 MG capsule, Take 40 mg by mouth daily., Disp: , Rfl:    polyethylene glycol (MIRALAX  / GLYCOLAX ) 17 g packet, Take 17 g by mouth daily as needed for mild constipation or moderate constipation., Disp: , Rfl:    potassium chloride  SA (KLOR-CON  M) 20 MEQ tablet, Take one tablet by mouth daily for one week, Disp: 7 tablet, Rfl: 0   rosuvastatin  (CRESTOR ) 5 MG tablet, Take 1 tablet (5 mg total) by mouth daily.,  Disp: 90 tablet, Rfl: 3   torsemide  (DEMADEX ) 20 MG tablet, Take 20 mg by mouth daily as needed (Fluid and weight gain)., Disp: , Rfl:    traMADol  (ULTRAM ) 50 MG tablet, Take 1 tablet (50 mg total) by mouth every 6 (six) hours as needed for moderate pain. (Patient taking differently: Take 25-50 mg by mouth every 6 (six) hours as needed for moderate pain (pain score 4-6).), Disp: 20 tablet, Rfl: 0   traZODone (DESYREL) 50 MG tablet, Take 50 mg by mouth at bedtime., Disp: , Rfl:    No orders of the defined types were placed in this encounter.    Medications Discontinued During This Encounter  Medication Reason   ciprofloxacin (CIPRO) 250 MG tablet Completed Course   OVER THE COUNTER MEDICATION Patient Preference   spironolactone  (ALDACTONE ) 25 MG tablet Patient Preference   fludrocortisone  (FLORINEF ) 0.1 MG tablet Side effect (s)     ASSESSMENT AND PLAN: .      ICD-10-CM   1. Acute on chronic diastolic heart failure (HCC)  Z61.09     2. Dyspnea on exertion  R06.09     3. Orthostatic hypotension  I95.1       Assessment and Plan Assessment & Plan Acute on Chronic diastolic heart failure   She has chronic diastolic heart failure with significant fluid retention and a high risk for acute exacerbation. The current torsemide  regimen effectively reduces fluid overload. Fludrocortisone  contributes  to fluid retention and will be tapered and discontinued every other day for one week. Emphasized dietary management and gradual weight reduction to prevent exacerbation. Discussed potential future use of Bumex once weight stabilizes. Monitoring fluid intake and output is crucial to protect renal function. Continue torsemide  40 mg twice daily. Monitor weight and fluid status closely. Educate on dietary management to prevent fluid retention.  Once her weight is back to baseline, transition to torsemide  40 mg once a day and if she remains stable to 20 mg once a day as she was on previously prior to the  onset of acute decompensated heart failure.  When she has been stable on torsemide  for at least a month, inform PCP about potential future use of Bumex 1 mg daily, to revert back to torsemide  she started accumulate fluid again.  Stage 3 chronic kidney disease   She has stage 3 chronic kidney disease with concern for renal function due to diuretic use. Torsemide  is effective but requires monitoring to prevent renal strain. Discussed balancing fluid intake and output to protect renal function. Monitor renal function with regular blood work. Educate on balancing fluid intake and output.  Unable to use Farxiga or Jardiance in view of recurrent UTI.  Orthostatic hypotension   She experiences orthostatic hypotension with previous episodes of blood pressure drop upon standing. Current blood pressure is 112/61 mmHg while sitting. No medication for orthostasis due to dizziness risk. Advised home blood pressure monitoring for orthostatic changes. Monitor blood pressure while sitting and standing at home. Submit blood pressure readings via MyChart.  I have discontinued fludrocortisone  as this could have contributed to her fluid gain.  Will consider addition of midodrine  if she becomes symptomatic, patient advised to send me home orthostatic blood pressure recordings.   Office visit in 6 months.     Signed,  Knox Perl, MD, [facility] 10/08/2023, 7:24 PM [facility] [address] [place], [place] [address] Phone: [phone]. Fax:  [phone]

## 2023-10-08 NOTE — Patient Instructions (Addendum)
 Medication Instructions:  Your physician has recommended you make the following change in your medication:   1) STOP fludrocortisone   *If you need a refill on your cardiac medications before your next appointment, please call your pharmacy*  Follow-Up: At [facility], you and your health needs are our priority.  As part of our continuing mission to provide you with exceptional heart care, our providers are all part of one team.  This team includes your primary Cardiologist (physician) and Advanced Practice Providers or APPs (Physician Assistants and Nurse Practitioners) who all work together to provide you with the care you need, when you need it.  Your next appointment:   6 month(s)  The format for your next appointment:   In Person  Provider:   Knox Perl, MD{  We recommend signing up for the patient portal called "MyChart".  Sign up information is provided on this After Visit Summary.  MyChart is used to connect with patients for Virtual Visits (Telemedicine).  Patients are able to view lab/test results, encounter notes, upcoming appointments, etc.  Non-urgent messages can be sent to your provider as well.   To learn more about what you can do with MyChart, go to [email or web address].

## 2023-10-10 ENCOUNTER — Other Ambulatory Visit: Payer: Self-pay

## 2023-10-10 MED ORDER — MIDODRINE HCL 5 MG PO TABS: 5.0000 mg | ORAL_TABLET | Freq: Three times a day (TID) | ORAL | 3 refills | Status: DC

## 2023-10-10 NOTE — Progress Notes (Signed)
 Per Dr. Berry Bristol: Send for midodrine  5 mg TID while awake for orthostatic hypotension 90 tab with 3 refills

## 2023-10-12 ENCOUNTER — Ambulatory Visit (HOSPITAL_COMMUNITY)
Admission: RE | Admit: 2023-10-12 | Discharge: 2023-10-12 | Disposition: A | Discharge status: Home / Self Care | Source: Ambulatory Visit | Attending: Vascular Surgery

## 2023-10-12 DIAGNOSIS — I872 Venous insufficiency (chronic) (peripheral): Secondary | ICD-10-CM | POA: Diagnosis not present

## 2023-10-17 ENCOUNTER — Inpatient Hospital Stay: Payer: PPO | Attending: Oncology | Admitting: Hematology and Oncology

## 2023-10-17 ENCOUNTER — Inpatient Hospital Stay: Payer: PPO

## 2023-10-17 ENCOUNTER — Other Ambulatory Visit: Payer: Self-pay | Admitting: Hematology and Oncology

## 2023-10-17 ENCOUNTER — Telehealth: Payer: Self-pay | Admitting: Oncology

## 2023-10-17 ENCOUNTER — Other Ambulatory Visit: Payer: Self-pay

## 2023-10-17 VITALS — BP 121/74 | HR 76 | Temp 98.1°F | Resp 20 | Ht 68.0 in | Wt 284.0 lb

## 2023-10-17 DIAGNOSIS — D509 Iron deficiency anemia, unspecified: Secondary | ICD-10-CM

## 2023-10-17 DIAGNOSIS — Z6841 Body Mass Index (BMI) 40.0 and over, adult: Secondary | ICD-10-CM | POA: Diagnosis not present

## 2023-10-17 DIAGNOSIS — Z79899 Other long term (current) drug therapy: Secondary | ICD-10-CM | POA: Insufficient documentation

## 2023-10-17 DIAGNOSIS — D696 Thrombocytopenia, unspecified: Secondary | ICD-10-CM | POA: Diagnosis not present

## 2023-10-17 DIAGNOSIS — E876 Hypokalemia: Secondary | ICD-10-CM | POA: Diagnosis not present

## 2023-10-17 LAB — CBC WITH DIFFERENTIAL (CANCER CENTER ONLY)
Abs Immature Granulocytes: 0 10*3/uL (ref 0.00–0.07)
Basophils Absolute: 0.1 10*3/uL (ref 0.0–0.1)
Basophils Relative: 2 %
Eosinophils Absolute: 0.2 10*3/uL (ref 0.0–0.5)
Eosinophils Relative: 6 %
HCT: 34.4 % — ABNORMAL LOW (ref 36.0–46.0)
Hemoglobin: 11.9 g/dL — ABNORMAL LOW (ref 12.0–15.0)
Immature Granulocytes: 0 %
Lymphocytes Relative: 44 %
Lymphs Abs: 1.8 10*3/uL (ref 0.7–4.0)
MCH: 31.5 pg (ref 26.0–34.0)
MCHC: 34.6 g/dL (ref 30.0–36.0)
MCV: 91 fL (ref 80.0–100.0)
Monocytes Absolute: 0.4 10*3/uL (ref 0.1–1.0)
Monocytes Relative: 9 %
Neutro Abs: 1.6 10*3/uL — ABNORMAL LOW (ref 1.7–7.7)
Neutrophils Relative %: 39 %
Platelet Count: 56 10*3/uL — ABNORMAL LOW (ref 150–400)
RBC: 3.78 MIL/uL — ABNORMAL LOW (ref 3.87–5.11)
RDW: 14.4 % (ref 11.5–15.5)
WBC Count: 4.1 10*3/uL (ref 4.0–10.5)
nRBC: 0 % (ref 0.0–0.2)

## 2023-10-17 LAB — FOLATE: Folate: 18.8 ng/mL (ref >5.9)

## 2023-10-17 LAB — CMP (CANCER CENTER ONLY)
ALT: 17 U/L (ref 0–44)
AST: 51 U/L — ABNORMAL HIGH (ref 15–41)
Albumin: 3 g/dL — ABNORMAL LOW (ref 3.5–5.0)
Alkaline Phosphatase: 134 U/L — ABNORMAL HIGH (ref 38–126)
Anion gap: 12 (ref 5–15)
BUN: 15 mg/dL (ref 8–23)
CO2: 26 mmol/L (ref 22–32)
Calcium: 9.3 mg/dL (ref 8.9–10.3)
Chloride: 107 mmol/L (ref 98–111)
Creatinine: 1.2 mg/dL — ABNORMAL HIGH (ref 0.44–1.00)
GFR, Estimated: 49 mL/min — ABNORMAL LOW (ref >60)
Glucose, Bld: 86 mg/dL (ref 70–99)
Potassium: 2.7 mmol/L — CL (ref 3.5–5.1)
Sodium: 144 mmol/L (ref 135–145)
Total Bilirubin: 1.6 mg/dL — ABNORMAL HIGH (ref 0.0–1.2)
Total Protein: 5.1 g/dL — ABNORMAL LOW (ref 6.5–8.1)

## 2023-10-17 LAB — IRON AND TIBC
Iron: 112 ug/dL (ref 28–170)
Saturation Ratios: 36 % — ABNORMAL HIGH (ref 10.4–31.8)
TIBC: 311 ug/dL (ref 250–450)
UIBC: 199 ug/dL

## 2023-10-17 LAB — VITAMIN B12: Vitamin B-12: 804 pg/mL (ref 180–914)

## 2023-10-17 LAB — FERRITIN: Ferritin: 76 ng/mL (ref 11–307)

## 2023-10-17 NOTE — Telephone Encounter (Signed)
 Patient has been scheduled for follow-up visit per 10/15/23 LOS.  Pt aware of scheduled appt details.

## 2023-10-17 NOTE — Progress Notes (Signed)
 [facility]  [address] [place],  [place]  [address] [phone]  Clinic Day:  10/17/2023  Referring physician: Gaither Juba, MD   HISTORY OF PRESENT ILLNESS:  The patient is a 68 y.o. female with thrombocytopenia.  She was also found to be iron deficient recently for which she last received IV iron in November 2023.   She comes in today to reassess her hemoglobin and low platelets.  Since her last visit, the patient has been doing okay.  From an anemia perspective, she denies having increased fatigue or any overt forms of blood loss.  She continues to deny having any underlying bleeding/bruising issues which concern her for worsening thrombocytopenia being present.    PHYSICAL EXAM:  Blood pressure 121/74, pulse 76, temperature 98.1 F (36.7 C), temperature source Oral, resp. rate 20, height 5\' 8"  (1.727 m), weight 284 lb (128.8 kg), SpO2 100%. Wt Readings from Last 3 Encounters:  10/17/23 284 lb (128.8 kg)  10/08/23 292 lb (132.5 kg)  07/09/23 298 lb 6.4 oz (135.4 kg)   Body mass index is 43.18 kg/m. Performance status (ECOG): 2 - Symptomatic, <50% confined to bed Physical Exam Constitutional:      Appearance: Normal appearance. She is obese. She is not ill-appearing.     Comments: She is in a wheelchair  HENT:     Mouth/Throat:     Mouth: Mucous membranes are moist.     Pharynx: Oropharynx is clear. No oropharyngeal exudate or posterior oropharyngeal erythema.  Cardiovascular:     Rate and Rhythm: Normal rate and regular rhythm.     Heart sounds: No murmur heard.    No friction rub. No gallop.  Pulmonary:     Effort: Pulmonary effort is normal. No respiratory distress.     Breath sounds: Normal breath sounds. No wheezing, rhonchi or rales.  Abdominal:     General: Bowel sounds are normal. There is no distension.     Palpations: Abdomen is soft. There is no mass.     Tenderness: There is no abdominal tenderness.  Musculoskeletal:         General: No swelling.     Right lower leg: No edema.     Left lower leg: No edema.  Lymphadenopathy:     Cervical: No cervical adenopathy.     Upper Body:     Right upper body: No supraclavicular or axillary adenopathy.     Left upper body: No supraclavicular or axillary adenopathy.     Lower Body: No right inguinal adenopathy. No left inguinal adenopathy.  Skin:    General: Skin is warm.     Coloration: Skin is not jaundiced.     Findings: No lesion or rash.  Neurological:     General: No focal deficit present.     Mental Status: She is alert and oriented to person, place, and time. Mental status is at baseline.  Psychiatric:        Mood and Affect: Mood normal.        Behavior: Behavior normal.        Thought Content: Thought content normal.    LABS:      Latest Ref Rng & Units 10/17/2023   10:09 AM 04/17/2023   10:16 AM 11/29/2022   10:54 AM  CBC  WBC 4.0 - 10.5 K/uL 4.1  4.6  3.8   Hemoglobin 12.0 - 15.0 g/dL 91.4  78.2  95.6   Hematocrit 36.0 - 46.0 % 34.4  36.8  39.3   Platelets 150 - 400 K/uL 56  68.0  61       Latest Ref Rng & Units 10/17/2023   10:09 AM 07/17/2023    1:12 PM 07/05/2023    1:06 PM  CMP  Glucose 70 - 99 mg/dL 86  81  89   BUN 8 - 23 mg/dL 15  15  13    Creatinine 0.44 - 1.00 mg/dL 6.96  2.95  2.84   Sodium 135 - 145 mmol/L 144  146  146   Potassium 3.5 - 5.1 mmol/L 2.7  3.2  3.7   Chloride 98 - 111 mmol/L 107  109  111   CO2 22 - 32 mmol/L 26  24  24    Calcium  8.9 - 10.3 mg/dL 9.3  8.8  9.4   Total Protein 6.5 - 8.1 g/dL 5.1     Total Bilirubin 0.0 - 1.2 mg/dL 1.6     Alkaline Phos 38 - 126 U/L 134     AST 15 - 41 U/L 51     ALT 0 - 44 U/L 17       Latest Reference Range & Units 04/17/23 10:16  Iron 42 - 145 ug/dL 132  TIBC 440.1 - 027.2 mcg/dL 536.6  Saturation Ratios 20.0 - 50.0 % 51.2 (H)  Ferritin 10.0 - 291.0 ng/mL 110.3  Transferrin 212.0 - 360.0 mg/dL 440.3 (L)  (H): Data is abnormally high (L): Data is abnormally  low   STUDIES:  VAS US  LOWER EXTREMITY VENOUS REFLUX Result Date: 10/12/2023  Lower Venous Reflux Study Patient Name:  Joanna Alvarado  Date of Exam:   10/12/2023 Medical Rec #: [id number]        Accession #:    [id number] Date of Birth: 1956-01-08        Patient Gender: F Patient Age:   22 years Exam Location:  [address] Procedure:      VAS US  LOWER EXTREMITY VENOUS REFLUX Referring Phys: JOSHUA ROBINS --------------------------------------------------------------------------------  Indications: Pain, and Swelling.  Limitations: Body habitus and poor ultrasound/tissue interface. Performing Technologist: Aloma Arrant RVS  Examination Guidelines: A complete evaluation includes B-mode imaging, spectral Doppler, color Doppler, and power Doppler as needed of all accessible portions of each vessel. Bilateral testing is considered an integral part of a complete examination. Limited examinations for reoccurring indications may be performed as noted. The reflux portion of the exam is performed with the patient in reverse Trendelenburg. Significant venous reflux is defined as >500 ms in the superficial venous system, and >1 second in the deep venous system.  Venous Reflux Times +--------------+---------+------+-----------+------------+--------+ RIGHT         Reflux NoRefluxReflux TimeDiameter cmsComments                         Yes                                  +--------------+---------+------+-----------+------------+--------+ CFV                     yes   >1 second                      +--------------+---------+------+-----------+------------+--------+ FV mid        no                                             +--------------+---------+------+-----------+------------+--------+  Popliteal     no                            .67              +--------------+---------+------+-----------+------------+--------+ GSV at SFJ    no                            .45               +--------------+---------+------+-----------+------------+--------+ GSV prox thighno                            .44              +--------------+---------+------+-----------+------------+--------+ GSV mid thigh                                       NWV      +--------------+---------+------+-----------+------------+--------+ GSV dist thighno                            .49              +--------------+---------+------+-----------+------------+--------+ GSV at knee   no                            .30              +--------------+---------+------+-----------+------------+--------+ SSV Pop Fossa no                            .14              +--------------+---------+------+-----------+------------+--------+ SSV prox calf no                            .23              +--------------+---------+------+-----------+------------+--------+   Summary: Right: - No evidence of deep vein thrombosis seen in the right lower extremity, from the common femoral through the popliteal veins. - No evidence of superficial venous thrombosis in the right lower extremity. - Venous reflux is noted in the right common femoral vein.  *See table(s) above for measurements and observations. Electronically signed by Delaney Fearing on 10/12/2023 at 5:22:21 PM.    Final      ASSESSMENT & PLAN:  Assessment/Plan:  A 68 y.o. female with thrombocytopenia and a history of iron deficiency anemia.  Her platelets are mildly lower than what they have been previously.  However, she has more than enough platelets to do the clotting her body needs.  If she has cirrhosis, this could be the ultimate etiology behind her thrombocytopenia.   With respect to her iron deficiency anemia, although lower, her hemoglobin remains normal. She does complain of dizziness today and notes an episode within the last couple of weeks of increased swelling to her face and left side. She was assessed by Dr. Thana Filter office and placed on  torsemide . She was increased to 80 mg daily. She is leaving this appointment and going for a follow up with Dr. Geraldo Klippel. We did obtain a CMP today. Results were not released until after patient left and potassium  was found to be 2.7. As this is most likely related to her torsemide , I did call and speak with Dr. Geraldo Klippel directly who will address this issue. Furthermore, her iron parameters remain normal.  For now, all of her hematologic parameters will be followed conservatively.  I will see her back in 6 months for repeat clinical assessment.  The patient understands all the plans discussed today and is in agreement with them.    Adelaide Adjutant, NP

## 2023-10-18 ENCOUNTER — Encounter: Payer: Self-pay | Admitting: Cardiology

## 2023-10-18 DIAGNOSIS — E876 Hypokalemia: Secondary | ICD-10-CM

## 2023-10-26 ENCOUNTER — Encounter: Payer: Self-pay | Admitting: Cardiology

## 2023-10-26 ENCOUNTER — Other Ambulatory Visit: Payer: Self-pay

## 2023-10-26 ENCOUNTER — Other Ambulatory Visit: Payer: Self-pay | Admitting: *Deleted

## 2023-10-26 DIAGNOSIS — I5032 Chronic diastolic (congestive) heart failure: Secondary | ICD-10-CM

## 2023-10-26 DIAGNOSIS — E876 Hypokalemia: Secondary | ICD-10-CM

## 2023-10-26 NOTE — Telephone Encounter (Signed)
 Orders faxed

## 2023-10-26 NOTE — Telephone Encounter (Signed)
Just BMP and BNP.

## 2023-10-26 NOTE — Telephone Encounter (Signed)
 Please review and advise lab orders.

## 2023-10-31 DIAGNOSIS — I5032 Chronic diastolic (congestive) heart failure: Secondary | ICD-10-CM | POA: Diagnosis not present

## 2023-10-31 DIAGNOSIS — E876 Hypokalemia: Secondary | ICD-10-CM | POA: Diagnosis not present

## 2023-10-31 LAB — LAB REPORT - SCANNED: EGFR: 60

## 2023-11-01 ENCOUNTER — Ambulatory Visit: Attending: Vascular Surgery | Admitting: Vascular Surgery

## 2023-11-01 ENCOUNTER — Other Ambulatory Visit (HOSPITAL_COMMUNITY): Payer: Self-pay

## 2023-11-01 ENCOUNTER — Encounter: Payer: Self-pay | Admitting: Vascular Surgery

## 2023-11-01 VITALS — BP 129/73 | HR 74 | Temp 98.3°F | Resp 20 | Ht 68.0 in | Wt 284.0 lb

## 2023-11-01 DIAGNOSIS — R609 Edema, unspecified: Secondary | ICD-10-CM

## 2023-11-01 DIAGNOSIS — I890 Lymphedema, not elsewhere classified: Secondary | ICD-10-CM

## 2023-11-01 NOTE — Progress Notes (Signed)
 Office Note     CC: Bilateral lower extremity Dema Requesting Provider:  Orvan Blanch, MD  HPI: Joanna Alvarado is a 68 y.o. (1955/06/19) female who presents at the request of Gaither Juba, MD for evaluation of bilateral lower extremity edema.  She was initially seen by Dr. Mildred All, and noted to have right lower extremity edema which extended onto the foot.  There was some concern that this could be chronic venous insufficiency, therefore she was sent to our office for further workup.  In the interim, she was seen by Dr. Stevie Elder, her cardiologist.  At that time, she had bilateral lower extremity edema and echo demonstrated acute diastolic heart failure with systolic blood pressure of 75.  She was started on midodrine  and the improvement in blood pressure also helped to improve perfusion to the kidneys and she was able to diurese the significant edema from bilateral lower extremities.  Over the last several weeks, she has been doing well.  Feels like she is back to her baseline.  On exam today, Joanna Alvarado was doing well, accompanied by her husband.  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  Both her and her husband have 2 biologic children apiece, and were married roughly 22 years ago.  She spends majority of her time taking care of her 69 year old mother whose health is failing.  Joanna Alvarado has poor ambulation at baseline, and needs to use a wheelchair when ambulating outside of the home.  Inside the home, she uses a walker to get to and from the restroom.  She has tried to wear compression stockings in the past, but stated they hurt too much. She sleeps with her legs elevated with good effect. No history of DVT, no history of venous procedures.   Past Medical History:  Diagnosis Date   Abnormal myocardial perfusion study 09/26/2019   Anemia    Angina pectoris (HCC) - Class II-III 09/26/2019   Aortic valve insufficiency    Arthritis    KNEES   Benign  neoplasm of ascending colon    Benign neoplasm of cecum    Benign neoplasm of descending colon    Benign neoplasm of sigmoid colon    Benign neoplasm of transverse colon    Bowel habit changes    Cataract    BILATERAL-REMOVED   CHF (congestive heart failure) (HCC)    Chronic postoperative pain 12/02/2012   Complication of anesthesia    Coronary artery disease    Diabetes mellitus without complication (HCC)    Difficult intubation    Disturbance of skin sensation 12/02/2012   Fibromyalgia    GERD (gastroesophageal reflux disease)    Head injury    scalp injury x2 required sutures   Headache    prior to hyster   Heart murmur    History of blood transfusion    History of colonic polyps    History of hiatal hernia    History of kidney stones    History of transfusion of platelets 11/2021   Hypertension    Hypothyroidism    Idiopathic thrombocytopenic purpura (ITP) (HCC)    Myalgia and myositis, unspecified 12/02/2012   Personal history of colonic polyps    Pneumonia    Primary hypothyroidism 11/30/2015   Senile nuclear sclerosis 04/24/2014   Sleep apnea    Thyroid  disease    Thyroid  nodule 11/30/2015   Type 2 diabetes mellitus without complications (HCC) 05/27/2018   11/17/22- A1C 4.9   Vitamin  D deficiency 11/30/2015    Past Surgical History:  Procedure Laterality Date   ABDOMINAL HYSTERECTOMY     AORTIC VALVE REPLACEMENT N/A 11/24/2021   Procedure: AORTIC VALVE REPLACEMENT (AVR) USING INSPIRIS RESILIA  AORTIC VALVE;  Surgeon: Shon Downing, MD;  Location: [facility] OR;  Service: Open Heart Surgery;  Laterality: N/A;   APPENDECTOMY     CHOLECYSTECTOMY     COLONOSCOPY N/A 07/27/2015   Procedure: COLONOSCOPY;  Surgeon: Tobin Forts, MD;  Location: [facility] ENDOSCOPY;  Service: Endoscopy;  Laterality: N/A;   COLONOSCOPY WITH PROPOFOL  N/A 04/28/2019   Procedure: COLONOSCOPY WITH PROPOFOL ;  Surgeon: Tobin Forts, MD;  Location: [facility] ENDOSCOPY;  Service: Endoscopy;  Laterality:  N/A;   COLONOSCOPY WITH PROPOFOL  N/A 01/01/2023   Procedure: COLONOSCOPY WITH PROPOFOL ;  Surgeon: Tobin Forts, MD;  Location: [facility]OSCOPY;  Service: Gastroenterology;  Laterality: N/A;   ESOPHAGOGASTRODUODENOSCOPY (EGD) WITH PROPOFOL  N/A 05/16/2023   Procedure: ESOPHAGOGASTRODUODENOSCOPY (EGD) WITH PROPOFOL ;  Surgeon: Tobin Forts, MD;  Location: [facility] ENDOSCOPY;  Service: Gastroenterology;  Laterality: N/A;  difficult intubation   EYE SURGERY     bilateral cataracts with lens implants   floater bone surgery     bone removed from right hand   FOOT SURGERY     RIGHT   FOOT SURGERY Left    triple orthodesis   FRACTURE SURGERY     left foot surgery     removed tendon and placed pins in foot   LEFT HEART CATH AND CORONARY ANGIOGRAPHY N/A 09/26/2019   Procedure: LEFT HEART CATH AND CORONARY ANGIOGRAPHY;  Surgeon: Arleen Lacer, MD;  Location: [facility] INVASIVE CV LAB;  Service: Cardiovascular;  Laterality: N/A;   PLANTAR FASCIA SURGERY     both feet   POLYPECTOMY  04/28/2019   Procedure: POLYPECTOMY;  Surgeon: Tobin Forts, MD;  Location: [facility] ENDOS[facility];  Service: Endoscopy;;   POLYPECTOMY  01/01/2023   Procedure: POLYPECTOMY;  Surgeon: Tobin Forts, MD;  Location: [facility];  Service: Gastroenterology;;   RIGHT HEART CATH N/A 02/15/2021   Procedure: RIGHT HEART CATH;  Surgeon: Cody Das, MD;  Location: [facility] INVASIVE CV LAB;  Service: Cardiovascular;  Laterality: N/A;   RIGHT/LEFT HEART CATH AND CORONARY ANGIOGRAPHY N/A 11/08/2021   Procedure: RIGHT/LEFT HEART CATH AND CORONARY ANGIOGRAPHY;  Surgeon: Knox Perl, MD;  Location: [facility] INVASIVE CV LAB;  Service: Cardiovascular;  Laterality: N/A;   STERNAL WIRES REMOVAL N/A 11/29/2022   Procedure: STERNAL WIRES REMOVAL;  Surgeon: Zelphia Higashi, MD;  Location: [facility] OR;  Service: Thoracic;  Laterality: N/A;   TEE WITHOUT CARDIOVERSION N/A 11/24/2021   Procedure: TRANSESOPHAGEAL ECHOCARDIOGRAM (TEE);  Surgeon: Shon Downing, MD;   Location: [facility] OR;  Service: Open Heart Surgery;  Laterality: N/A;   TONSILLECTOMY      Social History   Socioeconomic History   Marital status: Married    Spouse name: Not on file   Number of children: 2   Years of education: Not on file   Highest education level: Some college, no degree  Occupational History    Comment: retired  Tobacco Use   Smoking status: Never   Smokeless tobacco: Never  Vaping Use   Vaping status: Never Used  Substance and Sexual Activity   Alcohol use: Never   Drug use: Never   Sexual activity: Not on file  Other Topics Concern   Not on file  Social History Narrative   Lives with husband   caffeine 1 c daily  Social Drivers of Corporate investment banker Strain: Not on file  Food Insecurity: Low Risk  (02/06/2023)   Received from [facility]   Hunger Vital Sign    Worried About Running Out of Food in the Last Year: Never true    Ran Out of Food in the Last Year: Never true  Transportation Needs: No Transportation Needs (02/06/2023)   Received from [facility]    In the past 12 months, has lack of reliable transportation kept you from medical appointments, meetings, work or from getting things needed for daily living? : No  Physical Activity: Not on file  Stress: Not on file  Social Connections: Not on file  Intimate Partner Violence: Not on file    Family History  Problem Relation Age of Onset   Heart disease Mother 55   Diabetes Mother    Hypertension Mother    Hypertension Father    Colon polyps Brother    Stroke Maternal Grandmother    Heart disease Paternal Grandmother    Throat cancer Paternal Grandfather    Colon cancer Neg Hx    Esophageal cancer Neg Hx    Rectal cancer Neg Hx    Stomach cancer Neg Hx     Current Outpatient Medications  Medication Sig Dispense Refill   acetaminophen  (TYLENOL ) 500 MG tablet Take 1,000 mg by mouth every 6 (six) hours as needed for moderate pain.     albuterol  (VENTOLIN   HFA) 108 (90 Base) MCG/ACT inhaler Inhale 2 puffs into the lungs every 4 (four) hours as needed for wheezing or shortness of breath.     ergocalciferol (VITAMIN D2) 1.25 MG (50000 UT) capsule Take 50,000 Units by mouth every Tuesday.     lactulose  (CHRONULAC ) 10 GM/15ML solution Take 15-30 ml up to twice daily for constipation 1800 mL 1   levothyroxine  (SYNTHROID ) 150 MCG tablet Take 150 mcg by mouth daily before breakfast.     lidocaine  (LIDODERM ) 5 % Place 1 patch onto the skin daily as needed (Knee pain). Remove & Discard patch within 12 hours or as directed by MD     linaclotide  (LINZESS ) 290 MCG CAPS capsule Take 1 capsule (290 mcg total) by mouth daily before breakfast.     midodrine  (PROAMATINE ) 5 MG tablet Take 1 tablet (5 mg total) by mouth 3 (three) times daily with meals. 90 tablet 3   nitroGLYCERIN  (NITROSTAT ) 0.4 MG SL tablet Place 1 tablet (0.4 mg total) under the tongue every 5 (five) minutes as needed for chest pain. 25 tablet 2   omeprazole (PRILOSEC) 40 MG capsule Take 40 mg by mouth daily.     polyethylene glycol (MIRALAX  / GLYCOLAX ) 17 g packet Take 17 g by mouth daily as needed for mild constipation or moderate constipation.     potassium chloride  SA (KLOR-CON  M) 20 MEQ tablet Take 20 mEq by mouth 3 (three) times daily.     rosuvastatin  (CRESTOR ) 5 MG tablet Take 1 tablet (5 mg total) by mouth daily. 90 tablet 3   torsemide  (DEMADEX ) 20 MG tablet Take 80 mg by mouth daily.     traMADol  (ULTRAM ) 50 MG tablet Take 1 tablet (50 mg total) by mouth every 6 (six) hours as needed for moderate pain. (Patient taking differently: Take 25-50 mg by mouth every 6 (six) hours as needed for moderate pain (pain score 4-6).) 20 tablet 0   traZODone (DESYREL) 50 MG tablet Take 50 mg by mouth at bedtime.  metoprolol  tartrate (LOPRESSOR ) 25 MG tablet Take 1 tablet (25 mg total) by mouth 3 (three) times daily as needed (Palpitations). 30 tablet 2   No current facility-administered medications for  this visit.    Allergies  Allergen Reactions   Adhesive [Tape]     Latex, band aids  causes whelts and blisters   Dermabond causes, welts   Psyllium Other (See Comments)    Severe constipation   Trulicity [Dulaglutide]     KIDNEY INFECTION,INABILITY TO HAVE BOWEL MOVEMENTS,BLOOD IN URINE   Hydrocodone -Acetaminophen  Itching    Tolerates small/ infrequent doses   Latex Other (See Comments)    Band-aids causes whelts and blisters   Oxycontin [Oxycodone Hcl] Other (See Comments)    "FELT LIKE HEAD WAS GOING TO EXPLODE" in head   Ciprofloxacin Rash    Yeast infection and a severe rash    Codeine Itching and Rash   Other Palpitations    Steroids      "makes me feel like I'm having a heart attack. -     REVIEW OF SYSTEMS:  [X]  denotes positive finding, [ ]  denotes negative finding Cardiac  Comments:  Chest pain or chest pressure:    Shortness of breath upon exertion:    Short of breath when lying flat:    Irregular heart rhythm:        Vascular    Pain in calf, thigh, or hip brought on by ambulation:    Pain in feet at night that wakes you up from your sleep:     Blood clot in your veins:    Leg swelling:         Pulmonary    Oxygen at home:    Productive cough:     Wheezing:         Neurologic    Sudden weakness in arms or legs:     Sudden numbness in arms or legs:     Sudden onset of difficulty speaking or slurred speech:    Temporary loss of vision in one eye:     Problems with dizziness:         Gastrointestinal    Blood in stool:     Vomited blood:         Genitourinary    Burning when urinating:     Blood in urine:        Psychiatric    Major depression:         Hematologic    Bleeding problems:    Problems with blood clotting too easily:        Skin    Rashes or ulcers:        Constitutional    Fever or chills:      PHYSICAL EXAMINATION:  Vitals:   11/01/23 1543  BP: 129/73  Pulse: 74  Resp: 20  Temp: 98.3 F (36.8 C)  TempSrc:  Temporal  SpO2: 98%  Weight: 284 lb (128.8 kg)  Height: 5\' 8"  (1.727 m)    General:  WDWN in NAD; vital signs documented above Gait: Not observed HENT: WNL, normocephalic Pulmonary: normal non-labored breathing , without Rales, rhonchi,  wheezing Cardiac: regular HR Abdomen: soft, NT, no masses Skin: without rashes Vascular Exam/Pulses:  Right Left  Radial 2+ (normal) 2+ (normal)  Ulnar    Femoral    Popliteal    DP 2+ (normal) 2+ (normal)  PT     Extremities: without ischemic changes, without Gangrene , without cellulitis; without open wounds;  Musculoskeletal:  no muscle wasting or atrophy  Neurologic: A&O X 3;  No focal weakness or paresthesias are detected Psychiatric:  The pt has Normal affect.   Non-Invasive Vascular Imaging:     Venous Reflux Times  +--------------+---------+------+-----------+------------+--------+  RIGHT        Reflux NoRefluxReflux TimeDiameter cmsComments                          Yes                                   +--------------+---------+------+-----------+------------+--------+  CFV                    yes   >1 second                       +--------------+---------+------+-----------+------------+--------+  FV mid        no                                              +--------------+---------+------+-----------+------------+--------+  Popliteal    no                            .67               +--------------+---------+------+-----------+------------+--------+  GSV at SFJ    no                            .45               +--------------+---------+------+-----------+------------+--------+  GSV prox thighno                            .44               +--------------+---------+------+-----------+------------+--------+  GSV mid thigh                                       NWV       +--------------+---------+------+-----------+------------+--------+  GSV dist thighno                             .49               +--------------+---------+------+-----------+------------+--------+  GSV at knee   no                            .30               +--------------+---------+------+-----------+------------+--------+  SSV Pop Fossa no                            .14               +--------------+---------+------+-----------+------------+--------+  SSV prox calf no                            .23               +--------------+---------+------+-----------+------------+--------+  ASSESSMENT/PLAN:: 68 y.o. female presenting with history of bilateral lower extremity edema.  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  She has palpable pulses in the feet.  No significant swelling today. Venous duplex ultrasound was reviewed demonstrating no evidence of DVT, superficial venous thrombosis, no reflux except for at the femoral vein.  We had a long discussion regarding the above, most notably that her veins work appropriately for the most part, and that she does not require any surgical intervention at this time.  We discussed the importance of elevation and compression.  She is working on continuing her healthy diet and weight loss which is wonderful.  When it comes to lipedema, weight loss is the mainstay of treatment.  I do not think she is a candidate for gastric bypass due to her diastolic heart failure.  Joanna Alvarado can follow-up with me as needed at this time.  Should the edema worsen, recommend referral to the [place] [facility] for manual decompressive therapy and possible lymphedema pumps.    Kayla Part, MD [facility] [phone]

## 2023-11-02 ENCOUNTER — Telehealth: Payer: Self-pay | Admitting: Cardiology

## 2023-11-02 NOTE — Telephone Encounter (Signed)
 I reviewed her labs that were faxed by her PCP  Labs 10/23/2023:  Serum glucose 82 mg, BUN 14, creatinine 1.0, EGFR >60 mL, sodium 145, potassium 3.0.  Discussed with the patient regarding hypokalemia, patient's potassium dose has been increased by her PCP and also patient has reduced torsemide  from 80 mg a day to 40 mg a day and in fact as of today she is only taking 20 mg a day as well.  PCP will continue to monitor her potassium levels.  If the potassium level still remains low, consider adding spironolactone  25 mg daily for potassium sparing effect along with torsemide .

## 2023-11-12 ENCOUNTER — Other Ambulatory Visit: Payer: Self-pay

## 2023-11-12 ENCOUNTER — Emergency Department (HOSPITAL_COMMUNITY)

## 2023-11-12 ENCOUNTER — Ambulatory Visit: Payer: Self-pay | Admitting: Cardiology

## 2023-11-12 ENCOUNTER — Encounter (HOSPITAL_COMMUNITY): Payer: Self-pay

## 2023-11-12 ENCOUNTER — Telehealth: Payer: Self-pay

## 2023-11-12 ENCOUNTER — Emergency Department (HOSPITAL_COMMUNITY)
Admission: EM | Admit: 2023-11-12 | Discharge: 2023-11-12 | Disposition: A | Discharge status: Home / Self Care | Attending: Emergency Medicine | Admitting: Emergency Medicine

## 2023-11-12 DIAGNOSIS — I10 Essential (primary) hypertension: Secondary | ICD-10-CM | POA: Diagnosis not present

## 2023-11-12 DIAGNOSIS — Z91040 Latex allergy status: Secondary | ICD-10-CM | POA: Insufficient documentation

## 2023-11-12 DIAGNOSIS — E039 Hypothyroidism, unspecified: Secondary | ICD-10-CM | POA: Insufficient documentation

## 2023-11-12 DIAGNOSIS — R531 Weakness: Secondary | ICD-10-CM | POA: Diagnosis not present

## 2023-11-12 DIAGNOSIS — R0602 Shortness of breath: Secondary | ICD-10-CM | POA: Insufficient documentation

## 2023-11-12 DIAGNOSIS — R5381 Other malaise: Secondary | ICD-10-CM | POA: Insufficient documentation

## 2023-11-12 DIAGNOSIS — K921 Melena: Secondary | ICD-10-CM | POA: Diagnosis not present

## 2023-11-12 DIAGNOSIS — R002 Palpitations: Secondary | ICD-10-CM

## 2023-11-12 DIAGNOSIS — I2510 Atherosclerotic heart disease of native coronary artery without angina pectoris: Secondary | ICD-10-CM | POA: Diagnosis not present

## 2023-11-12 DIAGNOSIS — R5383 Other fatigue: Secondary | ICD-10-CM | POA: Diagnosis not present

## 2023-11-12 DIAGNOSIS — I700 Atherosclerosis of aorta: Secondary | ICD-10-CM | POA: Diagnosis not present

## 2023-11-12 LAB — BASIC METABOLIC PANEL WITH GFR
Anion gap: 9 (ref 5–15)
BUN: 19 mg/dL (ref 8–23)
CO2: 23 mmol/L (ref 22–32)
Calcium: 9.9 mg/dL (ref 8.9–10.3)
Chloride: 108 mmol/L (ref 98–111)
Creatinine, Ser: 1.1 mg/dL — ABNORMAL HIGH (ref 0.44–1.00)
GFR, Estimated: 54 mL/min — ABNORMAL LOW (ref >60)
Glucose, Bld: 84 mg/dL (ref 70–99)
Potassium: 4.4 mmol/L (ref 3.5–5.1)
Sodium: 140 mmol/L (ref 135–145)

## 2023-11-12 LAB — CBC
HCT: 36.4 % (ref 36.0–46.0)
Hemoglobin: 12.1 g/dL (ref 12.0–15.0)
MCH: 31.8 pg (ref 26.0–34.0)
MCHC: 33.2 g/dL (ref 30.0–36.0)
MCV: 95.8 fL (ref 80.0–100.0)
Platelets: 55 10*3/uL — ABNORMAL LOW (ref 150–400)
RBC: 3.8 MIL/uL — ABNORMAL LOW (ref 3.87–5.11)
RDW: 14.1 % (ref 11.5–15.5)
WBC: 4.3 10*3/uL (ref 4.0–10.5)
nRBC: 0 % (ref 0.0–0.2)

## 2023-11-12 LAB — URINALYSIS, ROUTINE W REFLEX MICROSCOPIC
Bilirubin Urine: NEGATIVE
Glucose, UA: NEGATIVE mg/dL
Ketones, ur: NEGATIVE mg/dL
Leukocytes,Ua: NEGATIVE
Nitrite: NEGATIVE
Protein, ur: NEGATIVE mg/dL
Specific Gravity, Urine: 1.025 (ref 1.005–1.030)
pH: 5 (ref 5.0–8.0)

## 2023-11-12 LAB — HEPATIC FUNCTION PANEL
ALT: 18 U/L (ref 0–44)
AST: 43 U/L — ABNORMAL HIGH (ref 15–41)
Albumin: 2.8 g/dL — ABNORMAL LOW (ref 3.5–5.0)
Alkaline Phosphatase: 95 U/L (ref 38–126)
Bilirubin, Direct: 0.4 mg/dL — ABNORMAL HIGH (ref 0.0–0.2)
Indirect Bilirubin: 1.4 mg/dL — ABNORMAL HIGH (ref 0.3–0.9)
Total Bilirubin: 1.8 mg/dL — ABNORMAL HIGH (ref 0.0–1.2)
Total Protein: 5.1 g/dL — ABNORMAL LOW (ref 6.5–8.1)

## 2023-11-12 LAB — BRAIN NATRIURETIC PEPTIDE: B Natriuretic Peptide: 52.4 pg/mL (ref 0.0–100.0)

## 2023-11-12 MED ORDER — ONDANSETRON 4 MG PO TBDP: 4.0000 mg | ORAL_TABLET | ORAL | 0 refills | Status: DC | PRN

## 2023-11-12 NOTE — Progress Notes (Signed)
 Labs 10/31/2023:  Serum glucose 82 mg, BUN 14, creatinine 1.0, EGFR >60 mL, potassium 3.0, sodium 145.  Low potassium probably related to combination of lactulose  use, Lasix  use.  She will need to be on spironolactone  25 to 50 mg daily and try to reduce using lactulose  or stop using lactulose  and see if her numbers would improve.

## 2023-11-12 NOTE — ED Provider Triage Note (Signed)
 Emergency Medicine Provider Triage Evaluation Note  Joanna Alvarado , a 68 y.o. female  was evaluated in triage.  Pt complains of generalized fatigue, achiness in her thighs/hips, bloody bowel movement on Saturday.  She feels generally weak, at times short of breath.  States she sometimes feels like this when her heart failure "acts up".  Not on anticoagulation.  Review of Systems  Positive: Bloody bowel movement, aching in legs and hips, generalized weakness, intermittent shortness of breath and fatigue, memory fog Negative: Fever, chest pain, shortness of breath, genitourinary symptoms, leg swelling  Physical Exam  BP (!) 145/69   Pulse 74   Temp 98.1 F (36.7 C) (Oral)   Resp 16   Wt 128 kg   BMI 42.91 kg/m  Gen:   Awake, no distress   Resp:  Normal effort  MSK:   Moves extremities without difficulty  Other:  Abdomen soft  Medical Decision Making  Medically screening exam initiated at 4:34 PM.  Appropriate orders placed.  XCARET MORAD was informed that the remainder of the evaluation will be completed by another provider, this initial triage assessment does not replace that evaluation, and the importance of remaining in the ED until their evaluation is complete.  68 year old female presents emergency department with generalized fatigue, brain fog, weakness, bloody bowel movement on Saturday.  Patient evaluated sitting up in chair, fully clothed, limited PE. Orders placed. Patient was counseled that they need to remain in the ED until the completion of their work-up including a full H&P, additional testing and results of any tests.  The patient appears stable and the remainder of the encounter may be completed by another provider.   Flonnie Humphrey, DO 11/12/23 1636

## 2023-11-12 NOTE — ED Notes (Signed)
 Urine collected and sent to lab.

## 2023-11-12 NOTE — ED Triage Notes (Signed)
 Pt bib EMS, from home, bilateral leg pain, weakness x 5 days,  bright red blood in stool 2 days ago. Denies hemorrhoids.   Ems vs 136/68 76 98%ra 90 cbg

## 2023-11-12 NOTE — Discharge Instructions (Signed)
 1.  Continue to monitor your temperature at home.  You report you have chills.  At this time there is not an obvious source of infection.  Return to the emergency department if you get new or concerning symptoms. 2.  Take extra strength Tylenol  if needed for body aches or other aches and pains.  Take Zofran  as prescribed as needed for nausea. 3.  Follow-up with your doctor for recheck within the next 3 to 5 days. 4.  Your COVID influenza test is in process.  Check your MyChart for your results. 5.  At this time you do have some redness on your right foot although this is not strongly suggestive of infection at this time.  It is not warm to the touch and there is not any significant swelling associated with that on the foot.  Continue to monitor this.  If it becomes warm and bright pink and continues to expand, come back for recheck.

## 2023-11-12 NOTE — ED Provider Notes (Signed)
 [place] EMERGENCY DEPARTMENT AT [facility] Provider Note   CSN: [id number] Arrival date & time: 11/12/23  1556     History  Chief Complaint  Patient presents with   Weakness   Shortness of Breath    NYAZIA CANEVARI is a 68 y.o. female.  HPI MARIADEJESUS CADE is a 67 y.o. female with a past medical history of hypertension, severe aortic regurgitation S/P BIOPROSTHETIC AV replacement 11/24/2021, chronic thrombocytopenia, morbid obesity and OSA on CPAP, GERD, hiatal hernia, hypercholesterolemia, hypothyroidism and chronic stable angina with normal coronary arteries/minimal coronary artery disease by angiography in April 2021, and again on 11/08/2021 including right heart catheterization revealing no significant CAD and normal right heart pressure with markedly elevated cardiac output and index probably secondary to obesity and severe aortic regurgitation.   Patient has had several symptoms over the past few days.  She reports that she has felt achy in her hips and thighs, she denies she has had pain or burning with urination.  Patient reports that she has had some chills and felt generally weak.  No documented fever.  Patient reports 3 days ago, she had a bowel movement and it was bloody.  Patient denies that she went on to have any persistent diarrheal bowel movements.  No vomiting.  No abdominal pain. Patient did have extended wait time before coming back to the room in the emergency department.  She reports sitting out in the lobby in the chair all that time made her right ankle swell.  She reports the top of her foot is somewhat red now.    Home Medications Prior to Admission medications   Medication Sig Start Date End Date Taking? Authorizing Provider  acetaminophen  (TYLENOL ) 500 MG tablet Take 1,000 mg by mouth every 6 (six) hours as needed for moderate pain.   Yes [provider]  ergocalciferol (VITAMIN D2) 1.25 MG (50000 UT) capsule Take 50,000 Units by mouth  every Tuesday.   Yes [provider]  lactulose  (CHRONULAC ) 10 GM/15ML solution Take 15-30 ml up to twice daily for constipation Patient taking differently: Take 15-30 ml by mouth daily for constipation 04/17/23  Yes Collier, Amanda R, PA-C  levothyroxine  (SYNTHROID ) 150 MCG tablet Take 150 mcg by mouth daily before breakfast.   Yes [provider]  lidocaine  (LIDODERM ) 5 % Place 1 patch onto the skin daily as needed (Knee pain). Remove & Discard patch within 12 hours or as directed by MD   Yes [provider]  linaclotide  (LINZESS ) 290 MCG CAPS capsule Take 1 capsule (290 mcg total) by mouth daily before breakfast. 01/03/23  Yes Tobin Forts, MD  midodrine  (PROAMATINE ) 5 MG tablet Take 1 tablet (5 mg total) by mouth 3 (three) times daily with meals. 10/10/23  Yes Knox Perl, MD  nitroGLYCERIN  (NITROSTAT ) 0.4 MG SL tablet Place 1 tablet (0.4 mg total) under the tongue every 5 (five) minutes as needed for chest pain. 02/06/23  Yes Knox Perl, MD  omeprazole (PRILOSEC) 40 MG capsule Take 40 mg by mouth daily.   Yes [provider]  ondansetron  (ZOFRAN -ODT) 4 MG disintegrating tablet Take 1 tablet (4 mg total) by mouth every 4 (four) hours as needed for nausea or vomiting. 11/12/23  Yes Wynetta Heckle, MD  polyethylene glycol (MIRALAX  / GLYCOLAX ) 17 g packet Take 17 g by mouth daily.   Yes [provider]  potassium chloride  SA (KLOR-CON  M) 20 MEQ tablet Take 20 mEq by mouth 3 (three) times daily. 10/17/23  Yes [provider]  rosuvastatin  (CRESTOR ) 5 MG tablet Take 1 tablet (5 mg total) by mouth daily. 09/28/23  Yes Knox Perl, MD  torsemide  (DEMADEX ) 20 MG tablet Take 80 mg by mouth daily.   Yes [provider]  traMADol  (ULTRAM ) 50 MG tablet Take 1 tablet (50 mg total) by mouth every 6 (six) hours as needed for moderate pain. Patient taking differently: Take 25-50 mg by mouth every 6 (six) hours as needed for moderate pain (pain score 4-6).  11/29/22  Yes Zelphia Higashi, MD  traZODone (DESYREL) 50 MG tablet Take 50 mg by mouth at bedtime. 07/06/23  Yes [provider]  albuterol  (VENTOLIN  HFA) 108 (90 Base) MCG/ACT inhaler Inhale 2 puffs into the lungs every 4 (four) hours as needed for wheezing or shortness of breath. Patient not taking: Reported on 11/12/2023    [provider]  metoprolol  tartrate (LOPRESSOR ) 25 MG tablet Take 1 tablet (25 mg total) by mouth 3 (three) times daily as needed (Palpitations). Patient not taking: Reported on 11/12/2023 02/06/23 11/11/24  Knox Perl, MD      Allergies    Adhesive [tape], Psyllium, Trulicity [dulaglutide], Hydrocodone -acetaminophen , Latex, Oxycontin [oxycodone hcl], Ciprofloxacin, Codeine, and Other    Review of Systems   Review of Systems  Physical Exam Updated Vital Signs BP (!) 100/46   Pulse 80   Temp 98.1 F (36.7 C) (Oral)   Resp 15   Ht 5\' 8"  (1.727 m)   Wt 128 kg   SpO2 100%   BMI 42.91 kg/m  Physical Exam Constitutional:      Comments: Patient is alert nontoxic.  No respiratory distress at rest.  HENT:     Mouth/Throat:     Mouth: Mucous membranes are moist.     Pharynx: Oropharynx is clear.  Eyes:     Extraocular Movements: Extraocular movements intact.  Cardiovascular:     Rate and Rhythm: Normal rate and regular rhythm.  Pulmonary:     Effort: Pulmonary effort is normal.     Breath sounds: Normal breath sounds.  Abdominal:     General: There is no distension.     Palpations: Abdomen is soft.     Tenderness: There is no abdominal tenderness. There is no guarding.  Musculoskeletal:     Cervical back: Neck supple.     Comments: Her lower legs are symmetric.  Calves are soft and pliable.  The right foot has a very subtle erythema to the dorsum of the foot.  No wound.  This is not warm to the touch.  Dorsalis pedis pulses are symmetric 2+ both feet.  Skin:    General: Skin is warm and dry.  Neurological:     General: No focal deficit  present.     Mental Status: She is oriented to person, place, and time.     Motor: No weakness.     Coordination: Coordination normal.  Psychiatric:        Mood and Affect: Mood normal.     ED Results / Procedures / Treatments   Labs (all labs ordered are listed, but only abnormal results are displayed) Labs Reviewed  BASIC METABOLIC PANEL WITH GFR - Abnormal; Notable for the following components:      Result Value   Creatinine, Ser 1.10 (*)    GFR, Estimated 54 (*)    All other components within normal limits  CBC - Abnormal; Notable for the following components:   RBC 3.80 (*)    Platelets  55 (*)    All other components within normal limits  HEPATIC FUNCTION PANEL - Abnormal; Notable for the following components:   Total Protein 5.1 (*)    Albumin  2.8 (*)    AST 43 (*)    Total Bilirubin 1.8 (*)    Bilirubin, Direct 0.4 (*)    Indirect Bilirubin 1.4 (*)    All other components within normal limits  URINALYSIS, ROUTINE W REFLEX MICROSCOPIC - Abnormal; Notable for the following components:   Color, Urine AMBER (*)    APPearance TURBID (*)    Hgb urine dipstick MODERATE (*)    Bacteria, UA MANY (*)    All other components within normal limits  RESP PANEL BY RT-PCR (RSV, FLU A&B, COVID)  RVPGX2  BRAIN NATRIURETIC PEPTIDE  LIPASE, BLOOD  PROTIME-INR  MAGNESIUM     EKG EKG Interpretation Date/Time:  Monday November 12 2023 16:15:57 EDT Ventricular Rate:  82 PR Interval:  140 QRS Duration:  114 QT Interval:  428 QTC Calculation: 500 R Axis:   -33  Text Interpretation: Sinus rhythm with Premature supraventricular complexes and with frequent Premature ventricular complexes Left axis deviation Incomplete left bundle branch block Minimal voltage criteria for LVH, may be normal variant ( Cornell product ) Prolonged QT Abnormal ECG When compared with ECG of 21-Jun-2023 16:23, PREVIOUS ECG IS PRESENT Confirmed by Florentino Hurdle [phone]) on 11/12/2023 4:33:48 PM  Radiology DG Chest  2 View Result Date: 11/12/2023 CLINICAL DATA:  sob weakness EXAM: CHEST - 2 VIEW COMPARISON:  11/21/2022. FINDINGS: Cardiac silhouette is unremarkable. No pneumothorax or pleural effusion. Left base consolidation or volume loss is unchanged. Aorta is calcified. The visualized skeletal structures are unremarkable. There are median sternotomy wires. Prosthetic aortic valve. IMPRESSION: Left base consolidation or volume loss. Electronically Signed   By: Sydell Eva M.D.   On: 11/12/2023 18:02    Procedures Procedures    Medications Ordered in ED Medications - No data to display  ED Course/ Medical Decision Making/ A&P                                 Medical Decision Making Amount and/or Complexity of Data Reviewed Labs: ordered. Radiology: ordered.  Risk Prescription drug management.   Patient present as outlined.  She has had general weakness and achiness with chills but no specific source.  Patient has not had any documented fever.  She is afebrile here.   Patient reported bloody looking bowel movement 3 days ago.  This has not persisted.  White count 4.3 hemoglobin 12.1 hematocrit 36 platelets 55 basic metabolic panel within normal limits with GFR of 54 total protein 5.1 albumin  2.8 AST 43 total bili 1.8 urinalysis 0-5 white cells BNP 52  Chest x-ray interpreted radiology shows a chronic appearing atelectasis in the left base.  I have reviewed additional CT scans and the same finding is stable.  At this time I have low suspicion that this represents pneumonia for the patient.  She does not have any significant cough, no documented fever, no hypoxia.  Urinalysis does not show signs of infection.  Patient has very mild erythema to the dorsum of the right foot which she reports started from sitting out in the waiting room for so long.  This is not clearly any cellulitis at this time.  The foot is not warm or hot to the touch.  Swelling as far as I can observe is very subtle  but the  patient reports it is more than it was previously.  We discussed watching this closely for more pronounced erythema or warmth to the touch or development of documented fever.  Patient voices understanding.  Patient reported seeing blood in her stool 2 days ago.  At this time her hemoglobin is stable at baseline.  No sign of any significant blood loss.  Patient is not having any ongoing frequent or bloody looking bowel movements.  Stable to follow-up with outpatient provider and schedule colonoscopy in outpatient basis if repeat is needed.  This time, with workup stable, vital signs stable will proceed with home management and careful return precautions reviewed.        Final Clinical Impression(s) / ED Diagnoses Final diagnoses:  Generalized weakness  Malaise and fatigue  Blood in stool    Rx / DC Orders ED Discharge Orders          Ordered    ondansetron  (ZOFRAN -ODT) 4 MG disintegrating tablet  Every 4 hours PRN        11/12/23 2324              Wynetta Heckle, MD 11/12/23 2338

## 2023-11-12 NOTE — Telephone Encounter (Signed)
 Called patient to check on her after she submitted this questionnaire. Advised pt to go to the ED. Questionnaire was forward to Dr. Berry [place] per patient request.   [facility] Mychart After Visit Questionnaire  Question 11/12/2023  1:05 PM EDT - Cleavon Curls by Patient  How are you feeling after your recent visit? I hurt so bad I can hardly move my body. Every part of my body is stiff. Has been this way for 7 days  Does the recommended course of treatment seem to be helping your symptoms? No. Much worse. Stiff like paralyzed  Are you experiencing any side effects from your recommended treatment? Yes. Legs can't move, neck and shoulder stiff, can't push myself out of a chair.  is there anything else you would like to ask your physician? What is happening to me. I feel like I am wrapped in a cast all over.  Can't hold things in my hand keep dropping them. Can't stretch. Walk in tiny tiny baby steps but have to be pulled up to stand. Simething major is wrong. Eyes see double. Please help

## 2023-11-13 LAB — RESP PANEL BY RT-PCR (RSV, FLU A&B, COVID)  RVPGX2
Influenza A by PCR: NEGATIVE
Influenza B by PCR: NEGATIVE
Resp Syncytial Virus by PCR: NEGATIVE
SARS Coronavirus 2 by RT PCR: NEGATIVE

## 2023-11-14 ENCOUNTER — Telehealth: Payer: Self-pay | Admitting: Cardiology

## 2023-11-14 DIAGNOSIS — E119 Type 2 diabetes mellitus without complications: Secondary | ICD-10-CM

## 2023-11-14 DIAGNOSIS — Z6841 Body Mass Index (BMI) 40.0 and over, adult: Secondary | ICD-10-CM | POA: Diagnosis not present

## 2023-11-14 DIAGNOSIS — M25561 Pain in right knee: Secondary | ICD-10-CM | POA: Diagnosis not present

## 2023-11-14 DIAGNOSIS — K625 Hemorrhage of anus and rectum: Secondary | ICD-10-CM | POA: Diagnosis not present

## 2023-11-14 DIAGNOSIS — I959 Hypotension, unspecified: Secondary | ICD-10-CM | POA: Diagnosis not present

## 2023-11-14 DIAGNOSIS — R350 Frequency of micturition: Secondary | ICD-10-CM | POA: Diagnosis not present

## 2023-11-14 DIAGNOSIS — R635 Abnormal weight gain: Secondary | ICD-10-CM | POA: Diagnosis not present

## 2023-11-14 DIAGNOSIS — I5033 Acute on chronic diastolic (congestive) heart failure: Secondary | ICD-10-CM

## 2023-11-14 NOTE — Telephone Encounter (Signed)
 Yes please

## 2023-11-14 NOTE — Telephone Encounter (Signed)
 She can go back to 80 mg if she starts to gain weight. She should restrict her fluid intake to how much urine she makes this way she will not gain weight. If she continues to drink fluids along with lasix  then weight will not change.  How is her apatite and has she lost her baseline weight as well. She has tried Trulicity before but should consider using Ozempic or Wegovy  for weight loss

## 2023-11-14 NOTE — Telephone Encounter (Signed)
 Called patient back about message. Patient stated her appetite is fine. Informed patient that we have pharmacist who can help with medications and weight loss medications. Will see if okay to refer to Pharm D at our office.

## 2023-11-14 NOTE — Telephone Encounter (Signed)
 Pt c/o swelling/edema: STAT if pt has developed SOB within 24 hours  If swelling, where is the swelling located? From the hips down  How much weight have you gained and in what time span? 10 lbs, in one day  Have you gained 2 pounds in a day or 5 pounds in a week? yes  Do you have a log of your daily weights (if so, list)? yes  Are you currently taking a fluid pill? yes  Are you currently SOB? yes  Have you traveled recently in a car or plane for an extended period of time? No  Pt went to  [facility] ER 11/12/23

## 2023-11-14 NOTE — Telephone Encounter (Signed)
 Patient calling about gaining weight over night after being in ED. Patient also complaining of SOB and BLE edema. Patient saw PCP today BP 113/56. PCP collected urine to culture after seeing her UA from ED.  PCP thinks she might have UTI. PCP started patient on antibiotic that she will start today. Patient stated her weight was 284 lbs and now it's 291 after losing 3 lbs in urine output today. Patient also stated that she had some forgetfulness and aches while waiting in the ED on Monday. Informed patient that sometimes the first sign of UTI is confusion. Patient stated ED reduced her torsemide  to 60 mg from 80 mg daily. Patient's BNP was WNL at ED visit. Will forward to Dr. Berry [place] for advisement.

## 2023-11-15 NOTE — Telephone Encounter (Signed)
 Placed referral to pharmD.

## 2023-12-03 DIAGNOSIS — E876 Hypokalemia: Secondary | ICD-10-CM | POA: Diagnosis not present

## 2023-12-03 DIAGNOSIS — I5032 Chronic diastolic (congestive) heart failure: Secondary | ICD-10-CM | POA: Diagnosis not present

## 2023-12-03 DIAGNOSIS — E663 Overweight: Secondary | ICD-10-CM | POA: Diagnosis not present

## 2023-12-03 DIAGNOSIS — Z6841 Body Mass Index (BMI) 40.0 and over, adult: Secondary | ICD-10-CM | POA: Diagnosis not present

## 2023-12-03 DIAGNOSIS — H109 Unspecified conjunctivitis: Secondary | ICD-10-CM | POA: Diagnosis not present

## 2023-12-03 DIAGNOSIS — L89309 Pressure ulcer of unspecified buttock, unspecified stage: Secondary | ICD-10-CM | POA: Diagnosis not present

## 2023-12-03 LAB — LAB REPORT - SCANNED: EGFR: 55

## 2023-12-04 NOTE — Telephone Encounter (Signed)
 Will order Zio patch monitoring for 2 weeks for palpitations.  Patient is active on MyChart, monitor can be mailed to her.

## 2023-12-05 ENCOUNTER — Ambulatory Visit: Attending: Cardiology

## 2023-12-05 DIAGNOSIS — R002 Palpitations: Secondary | ICD-10-CM

## 2023-12-05 NOTE — Progress Notes (Unsigned)
 Enrolled patient for a 14 day Zio XT  monitor to be mailed to patients home

## 2023-12-17 ENCOUNTER — Other Ambulatory Visit: Payer: Self-pay | Admitting: Specialist

## 2023-12-17 DIAGNOSIS — Z13820 Encounter for screening for osteoporosis: Secondary | ICD-10-CM

## 2023-12-17 NOTE — Progress Notes (Unsigned)
 12/17/2023 Joanna Alvarado [id number] January 02, 1956   Chief Complaint:  History of Present Illness: Joanna Alvarado. 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 She is known by Dr. Abran. Last seen in office by Alan Coombs 04/17/2023.      Latest Ref Rng & Units 11/12/2023    4:16 PM 10/17/2023   10:09 AM 04/17/2023   10:16 AM  CBC  WBC 4.0 - 10.5 K/uL 4.3  4.1  4.6   Hemoglobin 12.0 - 15.0 g/dL 87.8  88.0  87.3   Hematocrit 36.0 - 46.0 % 36.4  34.4  36.8   Platelets 150 - 400 K/uL 55  56  68.0         Latest Ref Rng & Units 11/12/2023    4:16 PM 10/17/2023   10:09 AM 07/17/2023    1:12 PM  CMP  Glucose 70 - 99 mg/dL 84  86  81   BUN 8 - 23 mg/dL 19  15  15    Creatinine 0.44 - 1.00 mg/dL 8.89  8.79  8.97   Sodium 135 - 145 mmol/L 140  144  146   Potassium 3.5 - 5.1 mmol/L 4.4  2.7  3.2   Chloride 98 - 111 mmol/L 108  107  109   CO2 22 - 32 mmol/L 23  26  24    Calcium  8.9 - 10.3 mg/dL 9.9  9.3  8.8   Total Protein 6.5 - 8.1 g/dL 5.1  5.1    Total Bilirubin 0.0 - 1.2 mg/dL 1.8  1.6    Alkaline Phos 38 - 126 U/L 95  134    AST 15 - 41 U/L 43  51    ALT 0 - 44 U/L 18  17     04/17/2023: AFP 5.8. tTG Ig A < 1. IgA 121. IgG 718. ANA negative. SMA < 20. AMA < 20. Hep A total antibody nonreactive. Hep B surface antigen negative.  Hep B surface antibody nonreactive. Hepatitis C antibody nonreactive.   MELD 3.0: 13 at 04/17/2023 10:16 AM MELD-Na: 11 at 04/17/2023 10:16 AM Calculated from: Serum Creatinine: 0.78 mg/dL (Using min of 1 mg/dL) at 88/87/7975 89:83 AM Serum Sodium: 140 mEq/L (Using max of 137 mEq/L) at 04/17/2023 10:16 AM Total Bilirubin: 1.6 mg/dL at 88/87/7975 89:83 AM Serum Albumin : 3  g/dL at 88/87/7975 89:83 AM INR(ratio): 1.3 ratio at 04/17/2023 10:16 AM Age at listing (hypothetical): 67 years Sex: Female at 04/17/2023 10:16 AM  CTAP 04/08/2023: COMPARISON:  Abdominopelvic CT 03/30/2023, 04/13/2021 and 08/15/2018.   FINDINGS: Lower chest: Stable chronic elevation of the left hemidiaphragm with chronic left lower lobe atelectasis. Previous median sternotomy and aortic valve replacement. No significant pleural or pericardial effusion.   Hepatobiliary: The liver is normal in density without suspicious focal abnormality. Several peripherally calcified subcapsular liver lesions appear chronic and unchanged. Possible mild morphologic changes of cirrhosis. No evidence of biliary dilatation status post cholecystectomy.   Pancreas: Mild atrophy. No focal abnormality, ductal dilatation or surrounding inflammation.   Spleen: Normal in size without focal abnormality.   Adrenals/Urinary Tract: Both adrenal glands appear normal. No evidence of urinary tract calculus, suspicious renal lesion or hydronephrosis. Unchanged central left renal parapelvic cyst and cortical scarring for which no specific  follow-up imaging is recommended. The bladder appears unremarkable for its degree of distention.   Stomach/Bowel: No enteric contrast administered. The stomach appears unremarkable for its degree of distension. No evidence of bowel wall thickening, distention or surrounding inflammatory change. The appendix is not visualized and may be surgically absent.   Vascular/Lymphatic: There are no enlarged abdominal or pelvic lymph nodes. Aortic and branch vessel atherosclerosis without evidence of aneurysm or large vessel occlusion. The portal, superior mesenteric and splenic veins are patent. Multiple large left abdominal varices with a probable splenorenal shunt, enlarged from 2020.   Reproductive: Hysterectomy.  No adnexal mass.   Other: There is a small amount of free pelvic  fluid. No focal extraluminal fluid collection or pneumoperitoneum. The abdominal wall appears intact.   Musculoskeletal: No acute or significant osseous findings. Asymmetric right hip arthropathy.   Unless specific follow-up recommendations are mentioned in the findings or impression sections, no imaging follow-up of any mentioned incidental findings is recommended.   IMPRESSION: 1. No acute findings or explanation for the patient's symptoms. No evidence of urinary tract calculus or hydronephrosis. 2. Multiple large left abdominal varices with a probable splenorenal shunt, enlarged from 2020 and suggesting portal hypertension. Possible mild morphologic changes of cirrhosis. 3. Small amount of free pelvic fluid, nonspecific. 4. Chronic elevation of the left hemidiaphragm with chronic left basilar atelectasis. 5.  Aortic Atherosclerosis  ECHO 06/29/2023: IMPRESSIONS Left ventricular ejection fraction, by estimation, is 55%. The left ventricle has normal function. The left ventricle has no regional wall motion abnormalities. There is mild concentric left ventricular hypertrophy. Left ventricular diastolic parameters are consistent with Grade I diastolic dysfunction (impaired relaxation). 1. Right ventricular systolic function is normal. The right ventricular size is normal. There is normal pulmonary artery systolic pressure. The estimated right ventricular systolic pressure is 29.4 mmHg. 2. 3. Left atrial size was mildly dilated. 4. Right atrial size was mildly dilated. The mitral valve is normal in structure. Trivial mitral valve regurgitation. No evidence of mitral stenosis. 5. There is a bioprosthetic aortic valve. Mean gradient 13 mmHg with no significant regurgitation noted. 6. Aortic dilatation noted. There is borderline dilatation of the ascending aorta, measuring 37 mm. 7. The inferior vena cava is normal in size with greater than 50% respiratory variability, suggesting right atrial  pressure of 3 mmHg.   PAST GI PROCEDURES:  EGD 05/16/2023: - Normal esophagus. No varices  - Normal stomach, except for small hiatal hernia and benign fundic gland polyps. No varices.  - Normal examined duodenum.  - No specimens collected. - Recall surveillance EGD 2 to 3 years   Colonoscopy 01/01/2023: - Three 2 to 4 mm polyps in the ascending colon and in the cecum, removed with a cold snare. Resected and retrieved.  - Diverticulosis in the left colon.  - The examination was other normal on direct and retroflexion views  - Recall colonoscopy 5 years  A. CECUM AND ASCENDING COLON, POLYPECTOMY:  Sessile serrated adenoma without cytologic dysplasia  Tubular adenoma  Negative for high-grade dysplasia and carcinoma   Current Medications, Allergies, Past Medical History, Past Surgical History, Family History and Social History were reviewed in [employer] record.   Review of Systems:   Constitutional: Negative for fever, sweats, chills or weight loss.  Respiratory: Negative for shortness of breath.   Cardiovascular: Negative for chest pain, palpitations and leg swelling.  Gastrointestinal: See HPI.  Musculoskeletal: Negative for back pain or muscle aches.  Neurological: Negative for dizziness, headaches or paresthesias.  Physical Exam: There were no vitals taken for this visit. General: in no acute distress. Head: Normocephalic and atraumatic. Eyes: No scleral icterus. Conjunctiva pink . Ears: Normal auditory acuity. Mouth: Dentition intact. No ulcers or lesions.  Lungs: Clear throughout to auscultation. Heart: Regular rate and rhythm, no murmur. Abdomen: Soft, nontender and nondistended. No masses or hepatomegaly. Normal bowel sounds x 4 quadrants.  Rectal: Deferred.  Musculoskeletal: Symmetrical with no gross deformities. Extremities: No edema. Neurological: Alert oriented x 4. No focal deficits.  Psychological: Alert and cooperative. Normal mood  and affect  Assessment and Recommendations: ***

## 2023-12-19 ENCOUNTER — Ambulatory Visit: Admitting: Nurse Practitioner

## 2023-12-19 ENCOUNTER — Encounter: Payer: Self-pay | Admitting: Nurse Practitioner

## 2023-12-19 ENCOUNTER — Ambulatory Visit: Payer: Self-pay | Admitting: Nurse Practitioner

## 2023-12-19 ENCOUNTER — Other Ambulatory Visit (INDEPENDENT_AMBULATORY_CARE_PROVIDER_SITE_OTHER)

## 2023-12-19 VITALS — BP 120/68 | HR 75 | Ht 68.0 in | Wt 270.0 lb

## 2023-12-19 DIAGNOSIS — K5900 Constipation, unspecified: Secondary | ICD-10-CM

## 2023-12-19 DIAGNOSIS — Z860101 Personal history of adenomatous and serrated colon polyps: Secondary | ICD-10-CM | POA: Diagnosis not present

## 2023-12-19 DIAGNOSIS — K7469 Other cirrhosis of liver: Secondary | ICD-10-CM

## 2023-12-19 DIAGNOSIS — K7460 Unspecified cirrhosis of liver: Secondary | ICD-10-CM

## 2023-12-19 DIAGNOSIS — I5032 Chronic diastolic (congestive) heart failure: Secondary | ICD-10-CM

## 2023-12-19 DIAGNOSIS — R1319 Other dysphagia: Secondary | ICD-10-CM | POA: Diagnosis not present

## 2023-12-19 DIAGNOSIS — K219 Gastro-esophageal reflux disease without esophagitis: Secondary | ICD-10-CM

## 2023-12-19 DIAGNOSIS — E876 Hypokalemia: Secondary | ICD-10-CM

## 2023-12-19 LAB — CBC WITH DIFFERENTIAL/PLATELET
Basophils Absolute: 0.1 K/uL (ref 0.0–0.1)
Basophils Relative: 2.2 % (ref 0.0–3.0)
Eosinophils Absolute: 0.1 K/uL (ref 0.0–0.7)
Eosinophils Relative: 4.8 % (ref 0.0–5.0)
HCT: 34.8 % — ABNORMAL LOW (ref 36.0–46.0)
Hemoglobin: 11.8 g/dL — ABNORMAL LOW (ref 12.0–15.0)
Lymphocytes Relative: 34.1 % (ref 12.0–46.0)
Lymphs Abs: 1.1 K/uL (ref 0.7–4.0)
MCHC: 34 g/dL (ref 30.0–36.0)
MCV: 92.9 fl (ref 78.0–100.0)
Monocytes Absolute: 0.3 K/uL (ref 0.1–1.0)
Monocytes Relative: 10.3 % (ref 3.0–12.0)
Neutro Abs: 1.5 K/uL (ref 1.4–7.7)
Neutrophils Relative %: 48.6 % (ref 43.0–77.0)
Platelets: 52 K/uL — ABNORMAL LOW (ref 150.0–400.0)
RBC: 3.74 Mil/uL — ABNORMAL LOW (ref 3.87–5.11)
RDW: 14.6 % (ref 11.5–15.5)
WBC: 3.1 K/uL — ABNORMAL LOW (ref 4.0–10.5)

## 2023-12-19 LAB — COMPREHENSIVE METABOLIC PANEL WITH GFR
ALT: 19 U/L (ref 0–35)
AST: 42 U/L — ABNORMAL HIGH (ref 0–37)
Albumin: 3 g/dL — ABNORMAL LOW (ref 3.5–5.2)
Alkaline Phosphatase: 123 U/L — ABNORMAL HIGH (ref 39–117)
BUN: 18 mg/dL (ref 6–23)
CO2: 30 meq/L (ref 19–32)
Calcium: 9.9 mg/dL (ref 8.4–10.5)
Chloride: 105 meq/L (ref 96–112)
Creatinine, Ser: 1 mg/dL (ref 0.40–1.20)
GFR: 57.63 mL/min — ABNORMAL LOW (ref >60.00)
Glucose, Bld: 93 mg/dL (ref 70–99)
Potassium: 2.9 meq/L — ABNORMAL LOW (ref 3.5–5.1)
Sodium: 142 meq/L (ref 135–145)
Total Bilirubin: 2 mg/dL — ABNORMAL HIGH (ref 0.2–1.2)
Total Protein: 5 g/dL — ABNORMAL LOW (ref 6.0–8.3)

## 2023-12-19 LAB — PROTIME-INR
INR: 1.3 ratio — ABNORMAL HIGH (ref 0.8–1.0)
Prothrombin Time: 14.1 s — ABNORMAL HIGH (ref 9.6–13.1)

## 2023-12-19 MED ORDER — OMEPRAZOLE 40 MG PO CPDR: 40.0000 mg | DELAYED_RELEASE_CAPSULE | Freq: Every day | ORAL | 3 refills | Status: AC

## 2023-12-19 MED ORDER — SPIRONOLACTONE 50 MG PO TABS: 50.0000 mg | ORAL_TABLET | ORAL | 1 refills | Status: DC

## 2023-12-19 NOTE — Telephone Encounter (Signed)
 Inbound call from patient requesting to have her results sent over to Dr Ladona, with [facility].   Patient was unaware of fax number.

## 2023-12-19 NOTE — Progress Notes (Signed)
 Discussed with the patient today regarding the lab values, suspect her abnormality in potassium is related to diuretic use and laxative use.  She has liver failure with ascites leading to significant weight gain then through heart failure per se.  I reviewed the GI note, she has been scheduled for repeat ultrasound of the abdomen.  In view of ascites which I suspect she probably has significant amount and also continued hypokalemia, will use spironolactone  at high dose, will start at 50 mg daily in the morning and she will obtain BMP in 1 week.  Orders have been placed and clear-cut instructions were given to the patient today on 12/19/2022 at 6:15 PM.    ICD-10-CM   1. Hypokalemia  E87.6 spironolactone  (ALDACTONE ) 50 MG tablet    Basic metabolic panel with GFR    2. Other cirrhosis of liver (HCC)  K74.69 spironolactone  (ALDACTONE ) 50 MG tablet    Basic metabolic panel with GFR    3. Chronic diastolic congestive heart failure (HCC)  I50.32 spironolactone  (ALDACTONE ) 50 MG tablet     Orders Placed This Encounter  Procedures   Basic metabolic panel with GFR    Meds ordered this encounter  Medications   spironolactone  (ALDACTONE ) 50 MG tablet    Sig: Take 1 tablet (50 mg total) by mouth every morning.    Dispense:  30 tablet    Refill:  1

## 2023-12-19 NOTE — Patient Instructions (Addendum)
 You have been scheduled for an abdominal ultrasound at [facility]  on 12/25/23 at 9:30am. Please arrive 30 minutes prior to your appointment for registration. Make certain not to have anything to eat or drink 6 hours prior to your appointment. Should you need to reschedule your appointment, please contact radiology at [phone]. This test typically takes about 30 minutes to perform.   Your provider has requested that you go to the basement level for lab work before leaving today. Press B on the elevator. The lab is located at the first door on the left as you exit the elevator.  Continue Omeprazole  40 mg once daily.  Follow a low sodium diet and increase protein intake.  Follow up in 6 months.  _______________________________________________________  If your blood pressure at your visit was 140/90 or greater, please contact your primary care physician to follow up on this.  _______________________________________________________  If you are age 22 or older, your body mass index should be between 23-30. Your Body mass index is 41.05 kg/m. If this is out of the aforementioned range listed, please consider follow up with your Primary Care Provider.  If you are age 47 or younger, your body mass index should be between 19-25. Your Body mass index is 41.05 kg/m. If this is out of the aformentioned range listed, please consider follow up with your Primary Care Provider.   ________________________________________________________  The [facility] providers would like to encourage you to use MYCHART to communicate with providers for non-urgent requests or questions.  Due to long hold times on the telephone, sending your provider a message by [facility] may be a faster and more efficient way to get a response.  Please allow 48 business hours for a response.  Please remember that this is for non-urgent requests.  _______________________________________________________   It was a pleasure to  see you today!  Thank you for trusting me with your gastrointestinal care!

## 2023-12-20 DIAGNOSIS — R300 Dysuria: Secondary | ICD-10-CM | POA: Diagnosis not present

## 2023-12-20 DIAGNOSIS — M1711 Unilateral primary osteoarthritis, right knee: Secondary | ICD-10-CM | POA: Diagnosis not present

## 2023-12-20 LAB — AFP TUMOR MARKER: AFP-Tumor Marker: 3.1 ng/mL

## 2023-12-20 NOTE — Progress Notes (Signed)
 Noted

## 2023-12-25 ENCOUNTER — Ambulatory Visit (INDEPENDENT_AMBULATORY_CARE_PROVIDER_SITE_OTHER)
Admission: RE | Admit: 2023-12-25 | Discharge: 2023-12-25 | Disposition: A | Discharge status: Home / Self Care | Source: Ambulatory Visit | Attending: Nurse Practitioner | Admitting: Nurse Practitioner

## 2023-12-25 DIAGNOSIS — K7460 Unspecified cirrhosis of liver: Secondary | ICD-10-CM | POA: Diagnosis not present

## 2023-12-25 DIAGNOSIS — R161 Splenomegaly, not elsewhere classified: Secondary | ICD-10-CM | POA: Diagnosis not present

## 2023-12-25 DIAGNOSIS — K7469 Other cirrhosis of liver: Secondary | ICD-10-CM

## 2023-12-25 DIAGNOSIS — N281 Cyst of kidney, acquired: Secondary | ICD-10-CM | POA: Diagnosis not present

## 2023-12-25 DIAGNOSIS — K7689 Other specified diseases of liver: Secondary | ICD-10-CM | POA: Diagnosis not present

## 2023-12-26 NOTE — Telephone Encounter (Signed)
 Patient is scheduled to see Stephane Quest, NP with [facility] on 04/04/24.

## 2023-12-27 DIAGNOSIS — M1711 Unilateral primary osteoarthritis, right knee: Secondary | ICD-10-CM | POA: Diagnosis not present

## 2023-12-28 ENCOUNTER — Other Ambulatory Visit (HOSPITAL_BASED_OUTPATIENT_CLINIC_OR_DEPARTMENT_OTHER): Payer: Self-pay | Admitting: Orthopedic Surgery

## 2023-12-28 ENCOUNTER — Encounter (HOSPITAL_BASED_OUTPATIENT_CLINIC_OR_DEPARTMENT_OTHER): Payer: Self-pay

## 2023-12-28 DIAGNOSIS — Z13820 Encounter for screening for osteoporosis: Secondary | ICD-10-CM

## 2023-12-31 NOTE — Progress Notes (Unsigned)
 Patient ID: Joanna Alvarado                 DOB: 03/03/1956                    MRN: [id number]     HPI: Joanna Alvarado is a 68 y.o. female patient referred to pharmacy clinic by [facility] to initiate GLP1-RA therapy. PMH is significant for CHF,OSA, T2DM, CKD stage 3,aortic valve insufficiency s/p aortic valve replaced with bioprosthetic valve  and obesity. Most recent BMI 39.53 kg/m .  Baseline weight and BMI: 389 lbs  62.8 kg/m  Current weight and BMI: 260 lbs 39.53 kg/m  Current meds that affect weight: torsemide   389 lbs went down to 289 lbs when she was on Ozempic     Diet: goes to nutritionist- follows healthy diet - doesn't add salt to her food and does not eat fried food    Exercise: none due to heavy weight, willing to start doing chair bound exercise and try to walk more around the house doing chores   Family History:  Relation Problem Comments  Mother (Alive, age 24y) Diabetes   Heart disease (Age: 70)   Hypertension     Father (Deceased) Hypertension     Brother Metallurgist) Colon polyps     Maternal Grandmother (Deceased) Stroke     Maternal Grandfather (Deceased)   Paternal Grandmother (Deceased) Heart disease     Paternal Grandfather (Deceased) Throat cancer       Labs: Lab Results  Component Value Date   HGBA1C 4.9 11/22/2021    Wt Readings from Last 1 Encounters:  12/19/23 270 lb (122.5 kg)    BP Readings from Last 1 Encounters:  12/19/23 120/68   Pulse Readings from Last 1 Encounters:  12/19/23 75       Component Value Date/Time   CHOL 119 09/30/2019 1041   TRIG 103 09/30/2019 1041   HDL 37 (L) 09/30/2019 1041   CHOLHDL 3.2 09/30/2019 1041   LDLCALC 63 09/30/2019 1041    Past Medical History:  Diagnosis Date   Abnormal myocardial perfusion study 09/26/2019   Anemia    Angina pectoris (HCC) - Class II-III 09/26/2019   Aortic valve insufficiency    Arthritis    KNEES   Benign neoplasm of ascending colon    Benign neoplasm of cecum     Benign neoplasm of descending colon    Benign neoplasm of sigmoid colon    Benign neoplasm of transverse colon    Bowel habit changes    Cataract    BILATERAL-REMOVED   CHF (congestive heart failure) (HCC)    Chronic postoperative pain 12/02/2012   Complication of anesthesia    Coronary artery disease    Diabetes mellitus without complication (HCC)    Difficult intubation    Disturbance of skin sensation 12/02/2012   Fibromyalgia    GERD (gastroesophageal reflux disease)    Head injury    scalp injury x2 required sutures   Headache    prior to hyster   Heart murmur    History of blood transfusion    History of colonic polyps    History of hiatal hernia    History of kidney stones    History of transfusion of platelets 11/2021   Hypertension    Hypothyroidism    Idiopathic thrombocytopenic purpura (ITP) (HCC)    Myalgia and myositis, unspecified 12/02/2012   Personal history of colonic polyps    Pneumonia  Primary hypothyroidism 11/30/2015   Senile nuclear sclerosis 04/24/2014   Sleep apnea    Thyroid  disease    Thyroid  nodule 11/30/2015   Type 2 diabetes mellitus without complications (HCC) 05/27/2018   11/17/22- A1C 4.9   Vitamin D deficiency 11/30/2015    Current Outpatient Medications on File Prior to Visit  Medication Sig Dispense Refill   acetaminophen  (TYLENOL ) 500 MG tablet Take 1,000 mg by mouth every 6 (six) hours as needed for moderate pain.     albuterol  (VENTOLIN  HFA) 108 (90 Base) MCG/ACT inhaler Inhale 2 puffs into the lungs every 4 (four) hours as needed for wheezing or shortness of breath. (Patient not taking: Reported on 11/12/2023)     ergocalciferol (VITAMIN D2) 1.25 MG (50000 UT) capsule Take 50,000 Units by mouth every Tuesday.     lactulose  (CHRONULAC ) 10 GM/15ML solution Take 15-30 ml up to twice daily for constipation (Patient taking differently: Take 15-30 ml by mouth daily for constipation) 1800 mL 1   levothyroxine  (SYNTHROID ) 150 MCG tablet  Take 150 mcg by mouth daily before breakfast.     lidocaine  (LIDODERM ) 5 % Place 1 patch onto the skin daily as needed (Knee pain). Remove & Discard patch within 12 hours or as directed by MD     linaclotide  (LINZESS ) 290 MCG CAPS capsule Take 1 capsule (290 mcg total) by mouth daily before breakfast.     metoprolol  tartrate (LOPRESSOR ) 25 MG tablet Take 1 tablet (25 mg total) by mouth 3 (three) times daily as needed (Palpitations). (Patient not taking: Reported on 11/12/2023) 30 tablet 2   midodrine  (PROAMATINE ) 5 MG tablet Take 1 tablet (5 mg total) by mouth 3 (three) times daily with meals. 90 tablet 3   nitroGLYCERIN  (NITROSTAT ) 0.4 MG SL tablet Place 1 tablet (0.4 mg total) under the tongue every 5 (five) minutes as needed for chest pain. 25 tablet 2   omeprazole  (PRILOSEC) 40 MG capsule Take 1 capsule (40 mg total) by mouth daily. 90 capsule 3   ondansetron  (ZOFRAN -ODT) 4 MG disintegrating tablet Take 1 tablet (4 mg total) by mouth every 4 (four) hours as needed for nausea or vomiting. 20 tablet 0   polyethylene glycol (MIRALAX  / GLYCOLAX ) 17 g packet Take 17 g by mouth daily.     potassium chloride  SA (KLOR-CON  M) 20 MEQ tablet Take 20 mEq by mouth 3 (three) times daily.     rosuvastatin  (CRESTOR ) 5 MG tablet Take 1 tablet (5 mg total) by mouth daily. 90 tablet 3   spironolactone  (ALDACTONE ) 50 MG tablet Take 1 tablet (50 mg total) by mouth every morning. 30 tablet 1   torsemide  (DEMADEX ) 20 MG tablet Take 80 mg by mouth daily.     traMADol  (ULTRAM ) 50 MG tablet Take 1 tablet (50 mg total) by mouth every 6 (six) hours as needed for moderate pain. (Patient taking differently: Take 25-50 mg by mouth every 6 (six) hours as needed for moderate pain (pain score 4-6).) 20 tablet 0   traZODone (DESYREL) 50 MG tablet Take 50 mg by mouth at bedtime.     No current facility-administered medications on file prior to visit.    Allergies  Allergen Reactions   Adhesive [Tape]     Latex, band aids   causes whelts and blisters   Dermabond causes, welts   Psyllium Other (See Comments)    Severe constipation   Trulicity [Dulaglutide]     KIDNEY INFECTION,INABILITY TO HAVE BOWEL MOVEMENTS,BLOOD IN URINE   Hydrocodone -Acetaminophen  Itching  Tolerates small/ infrequent doses   Latex Other (See Comments)    Band-aids causes whelts and blisters   Oxycontin [Oxycodone Hcl] Other (See Comments)    FELT LIKE HEAD WAS GOING TO EXPLODE in head   Ciprofloxacin Rash    Yeast infection and a severe rash    Codeine Itching and Rash   Other Palpitations    Steroids      makes me feel like I'm having a heart attack. -     Assessment/Plan:  1. Weight loss - The patient has not achieved at least a 5% body weight loss over the past 3-6 months despite comprehensive lifestyle modifications, making pharmacotherapy appropriate as an adjunct. She previously used Ozempic, which successfully lowered her A1c to the non-diabetic range and resulted in nearly 120 lbs of weight loss. She is scheduled for knee surgery and needs to reduce her BMI below 40. Recently, her GI doctor referred her to a hepatologist for evaluation of severe liver disease. Although tirzepatide shows promising data for MASH, its label advises against use in patients with advanced liver disease, cirrhosis, or liver failure. The patient is scheduled to see the hepatologist at the end of October to assess the safety of tirzepatide and further evaluation for the liver disease. She is willing to pay cash for the medication if deemed safe. Follow-up will occur in 3 months via phone.  Robbi Blanch, Pharm.D [facility]. [facility] [address], [place], [place] [address] Phone: [phone]; Fax: [phone]

## 2024-01-01 ENCOUNTER — Other Ambulatory Visit (HOSPITAL_COMMUNITY): Payer: Self-pay

## 2024-01-01 ENCOUNTER — Encounter: Payer: Self-pay | Admitting: Oncology

## 2024-01-01 ENCOUNTER — Telehealth: Payer: Self-pay

## 2024-01-01 ENCOUNTER — Telehealth: Payer: Self-pay | Admitting: Pharmacist

## 2024-01-01 DIAGNOSIS — R002 Palpitations: Secondary | ICD-10-CM | POA: Diagnosis not present

## 2024-01-01 NOTE — Telephone Encounter (Signed)
 Pharmacy Patient Advocate Encounter   Received notification from Physician's Office that prior authorization for ZEPBOUND is required/requested.   Insurance verification completed.   The patient is insured through [facility] ADVANTAGE/RX ADVANCE .   Per test claim: DRUG NOT ON FORMULARY

## 2024-01-01 NOTE — Telephone Encounter (Signed)
 Patient called wanting to insure referral was sent over to [facility] for liver transplant, please advise.  Thank you.

## 2024-01-02 ENCOUNTER — Ambulatory Visit: Attending: Cardiology | Admitting: Pharmacist

## 2024-01-02 ENCOUNTER — Encounter: Payer: Self-pay | Admitting: Pharmacist

## 2024-01-02 VITALS — Ht 68.0 in | Wt 260.0 lb

## 2024-01-02 DIAGNOSIS — E66813 Obesity, class 3: Secondary | ICD-10-CM

## 2024-01-02 DIAGNOSIS — R002 Palpitations: Secondary | ICD-10-CM

## 2024-01-02 DIAGNOSIS — Z6841 Body Mass Index (BMI) 40.0 and over, adult: Secondary | ICD-10-CM

## 2024-01-02 DIAGNOSIS — G4733 Obstructive sleep apnea (adult) (pediatric): Secondary | ICD-10-CM

## 2024-01-02 MED ORDER — TORSEMIDE 20 MG PO TABS: 80.0000 mg | ORAL_TABLET | Freq: Every day | ORAL | 2 refills | Status: DC

## 2024-01-02 MED ORDER — POTASSIUM CHLORIDE CRYS ER 20 MEQ PO TBCR: 20.0000 meq | EXTENDED_RELEASE_TABLET | Freq: Three times a day (TID) | ORAL | 1 refills | Status: DC

## 2024-01-03 ENCOUNTER — Ambulatory Visit: Payer: Self-pay | Admitting: Cardiology

## 2024-01-03 DIAGNOSIS — M1711 Unilateral primary osteoarthritis, right knee: Secondary | ICD-10-CM | POA: Diagnosis not present

## 2024-01-03 NOTE — Telephone Encounter (Signed)
 Patient requesting to speak with a nurse in regards to previous referral to liver. Please advise.

## 2024-01-14 ENCOUNTER — Telehealth: Payer: Self-pay | Admitting: Nurse Practitioner

## 2024-01-14 NOTE — Telephone Encounter (Signed)
 Patient called and stated that she would like to speak to [facility] in regards to a referral. Patient stated that her insurance did approve the referral but her authorization date was from July to October 29 th and that does not help her because she has her appointment for the 31 st of October. Patient is requesting a call back. Please advise.

## 2024-01-15 NOTE — Telephone Encounter (Signed)
 Spoke to patient. She states that insurance only gave approval for [facility] eval from 7/29-10/29. Her appointment with [facility] is not until 10/31. Patient states that she has asked insurance for extension and they will not provide one. Has also called [facility] who will not see her any sooner than 04/04/24 and asked her several times what made her think she needed a sooner appointment. States she has been on the phone 9 times with different insurance agents trying to find out how to get referral for 10/31 approved but has had no luck.   I advised patient that unfortunately, I am unable to do anything additional on my part for the referral as originally, the referral was faxed with her demographic information and reason for referral. There was no date placed on the referral. [facility] comes up with appointment date for their clinic.   I have contacted [facility] to inquire about an earlier appointment so patient does not have to continue with insurance calls. I am told someone from scheduling will call back to assist.

## 2024-01-21 ENCOUNTER — Ambulatory Visit (INDEPENDENT_AMBULATORY_CARE_PROVIDER_SITE_OTHER)
Admission: RE | Admit: 2024-01-21 | Discharge: 2024-01-21 | Disposition: A | Discharge status: Home / Self Care | Source: Ambulatory Visit | Attending: Orthopedic Surgery | Admitting: Orthopedic Surgery

## 2024-01-21 DIAGNOSIS — Z13820 Encounter for screening for osteoporosis: Secondary | ICD-10-CM

## 2024-01-21 DIAGNOSIS — Z780 Asymptomatic menopausal state: Secondary | ICD-10-CM

## 2024-01-21 DIAGNOSIS — M810 Age-related osteoporosis without current pathological fracture: Secondary | ICD-10-CM | POA: Diagnosis not present

## 2024-01-24 DIAGNOSIS — Z6839 Body mass index (BMI) 39.0-39.9, adult: Secondary | ICD-10-CM | POA: Diagnosis not present

## 2024-01-24 DIAGNOSIS — R300 Dysuria: Secondary | ICD-10-CM | POA: Diagnosis not present

## 2024-01-25 ENCOUNTER — Telehealth: Payer: Self-pay | Admitting: Cardiology

## 2024-01-25 DIAGNOSIS — I5032 Chronic diastolic (congestive) heart failure: Secondary | ICD-10-CM

## 2024-01-25 DIAGNOSIS — Z79899 Other long term (current) drug therapy: Secondary | ICD-10-CM

## 2024-01-25 DIAGNOSIS — R0609 Other forms of dyspnea: Secondary | ICD-10-CM

## 2024-01-25 MED ORDER — POTASSIUM CHLORIDE CRYS ER 20 MEQ PO TBCR: EXTENDED_RELEASE_TABLET | ORAL | 3 refills | Status: DC

## 2024-01-25 MED ORDER — TORSEMIDE 20 MG PO TABS: ORAL_TABLET | ORAL | 3 refills | Status: DC

## 2024-01-25 NOTE — Telephone Encounter (Signed)
  Per Mychart scheduling message:  Pt c/o swelling/edema: STAT if pt has developed SOB within 24 hours  If swelling, where is the swelling located?   How much weight have you gained and in what time span?   Have you gained 2 pounds in a day or 5 pounds in a week?   Do you have a log of your daily weights (if so, list)?   Are you currently taking a fluid pill?   Are you currently SOB?   Have you traveled recently in a car or plane for an extended period of time?   The swelling is always in my hips and upper buttocks. Dr Ladona said it was diastolic heart failure. 10 lbs in 4 days. Yes 4 totsemide per day and swelling is coming down with 3 potassium. My potassium in my last lab was 2.3. Just if I walk with walker. I also have another kidney infection taking ciprofloxacin hcl 500 (2 ) per day. But I can wait until Dec.    Colleen M [facility] Aug 4 at 4:44 PM Dear Ms. Goodson, your labs done 12/19/2023 showed you had a low potassium level and you were previously advised to follow up with your cardiologist to manage that results and to manage your diuretics. Overall, you liver tests are stable with a slight bump in the MELD score which indicated possible slight advancement in you liver disease.

## 2024-01-25 NOTE — Telephone Encounter (Signed)
 Pt reports that she weighed 251 last week and today she weighs 269.   She has shortness of breath with activity, but not at rest. She reports that her potassium was 2.9-7/16/25- GI provider drew labs. They told her to follow up with the Cardiologist. She said she has tried to call several times and is never able to get through. She has also sent MyChart messages.   She was without potassium and torsemide  for 2 weeks. She started taking it again 2 weeks ago- She is taking 3 potassium and 4 torsemides for the last few days when she noticed that she was so swollen.  Reports that the dose that she was increased to the last office visit was not sent in; therefore she ran out; and she has not been able to consistently take the correct dosage.

## 2024-01-25 NOTE — Telephone Encounter (Signed)
 Spoke with Dr Ladona- orders received= Increase Potassium to 40 mg twice daily and Torsemide  40 mg twice daily- for one week After one week- go back to Potassium 60mg  daily and Torsemide  40 mg daily BMP= 10 days= (02/05/24) Make appt with Dr Ladona in 2 weeks  Called the patient and went over the information above. Appt made for 02/07/24 with Dr Ladona; pt will come to get lab work on 02/05/24. ER precautions given. She verbalized understanding. RX sent for potassium and torsemide .

## 2024-01-29 DIAGNOSIS — D696 Thrombocytopenia, unspecified: Secondary | ICD-10-CM | POA: Diagnosis not present

## 2024-01-29 DIAGNOSIS — K7460 Unspecified cirrhosis of liver: Secondary | ICD-10-CM | POA: Diagnosis not present

## 2024-01-29 DIAGNOSIS — Z6839 Body mass index (BMI) 39.0-39.9, adult: Secondary | ICD-10-CM | POA: Diagnosis not present

## 2024-01-29 DIAGNOSIS — I5030 Unspecified diastolic (congestive) heart failure: Secondary | ICD-10-CM | POA: Diagnosis not present

## 2024-01-29 DIAGNOSIS — G2581 Restless legs syndrome: Secondary | ICD-10-CM | POA: Diagnosis not present

## 2024-02-01 DIAGNOSIS — R0609 Other forms of dyspnea: Secondary | ICD-10-CM | POA: Diagnosis not present

## 2024-02-01 DIAGNOSIS — I5032 Chronic diastolic (congestive) heart failure: Secondary | ICD-10-CM | POA: Diagnosis not present

## 2024-02-01 DIAGNOSIS — Z79899 Other long term (current) drug therapy: Secondary | ICD-10-CM | POA: Diagnosis not present

## 2024-02-02 LAB — BASIC METABOLIC PANEL WITH GFR
BUN/Creatinine Ratio: 14 (ref 12–28)
BUN: 16 mg/dL (ref 8–27)
CO2: 24 mmol/L (ref 20–29)
Calcium: 9.2 mg/dL (ref 8.7–10.3)
Chloride: 109 mmol/L — ABNORMAL HIGH (ref 96–106)
Creatinine, Ser: 1.13 mg/dL — ABNORMAL HIGH (ref 0.57–1.00)
Glucose: 87 mg/dL (ref 70–99)
Potassium: 4 mmol/L (ref 3.5–5.2)
Sodium: 145 mmol/L — ABNORMAL HIGH (ref 134–144)
eGFR: 53 mL/min/1.73 — ABNORMAL LOW (ref >59)

## 2024-02-03 ENCOUNTER — Ambulatory Visit: Payer: Self-pay | Admitting: Cardiology

## 2024-02-03 NOTE — Progress Notes (Signed)
 Potassium levels are stable renal function is stable on aggressive diuresis.  Will discuss in office visit soon.

## 2024-02-05 ENCOUNTER — Encounter (HOSPITAL_BASED_OUTPATIENT_CLINIC_OR_DEPARTMENT_OTHER): Payer: Self-pay

## 2024-02-06 DIAGNOSIS — E559 Vitamin D deficiency, unspecified: Secondary | ICD-10-CM | POA: Diagnosis not present

## 2024-02-06 DIAGNOSIS — E119 Type 2 diabetes mellitus without complications: Secondary | ICD-10-CM | POA: Diagnosis not present

## 2024-02-06 DIAGNOSIS — E039 Hypothyroidism, unspecified: Secondary | ICD-10-CM | POA: Diagnosis not present

## 2024-02-06 DIAGNOSIS — E041 Nontoxic single thyroid nodule: Secondary | ICD-10-CM | POA: Diagnosis not present

## 2024-02-07 ENCOUNTER — Encounter: Payer: Self-pay | Admitting: Cardiology

## 2024-02-07 ENCOUNTER — Ambulatory Visit: Attending: Cardiology | Admitting: Cardiology

## 2024-02-07 VITALS — BP 118/60 | HR 76 | Resp 16 | Ht 68.0 in | Wt 273.0 lb

## 2024-02-07 DIAGNOSIS — R0609 Other forms of dyspnea: Secondary | ICD-10-CM

## 2024-02-07 DIAGNOSIS — I5032 Chronic diastolic (congestive) heart failure: Secondary | ICD-10-CM | POA: Diagnosis not present

## 2024-02-07 DIAGNOSIS — Z6841 Body Mass Index (BMI) 40.0 and over, adult: Secondary | ICD-10-CM | POA: Diagnosis not present

## 2024-02-07 DIAGNOSIS — E66813 Obesity, class 3: Secondary | ICD-10-CM | POA: Diagnosis not present

## 2024-02-07 DIAGNOSIS — I951 Orthostatic hypotension: Secondary | ICD-10-CM | POA: Diagnosis not present

## 2024-02-07 MED ORDER — TORSEMIDE 40 MG PO TABS: 40.0000 mg | ORAL_TABLET | Freq: Two times a day (BID) | ORAL | 3 refills | Status: DC

## 2024-02-07 NOTE — Progress Notes (Signed)
 Cardiology Office Note:  .   Date:  02/07/2024  ID:  Joanna Alvarado, DOB Oct 11, 1955, MRN [id number] PCP: Ina Marcellus RAMAN, MD  [facility] Providers Cardiologist:  Gordy Bergamo, MD   History of Present Illness: .   Joanna Alvarado is a 68 y.o. past medical history of hypertension with stage IIIa chronic kidney disease, severe aortic regurgitation S/P BIOPROSTHETIC AV replacement 11/24/2021, chronic thrombocytopenia, morbid obesity and OSA on CPAP, GERD, hiatal hernia, hypercholesterolemia, hypothyroidism and advanced liver disease with mild cirrhosis of liver due to fatty liver and chronic hypotension for which she is on midodrine . She is not on SGLT2 inhibitors due to recurrent UTI.  Due to diuretic use, she has had significant issues with hypokalemia as well.  Cardiac catheterization on 11/08/2021 revealed normal coronary arteries and normal right heart pressures and EDP was mild to moderately elevated at 21 mmHg.  She has also had nuclear stress test on 06/25/2023 which was nonischemic with EF 55%.  She continues to state that she acutely develops significant weight gain and marked swelling of her flanks and upper thigh area.  Several calls have been made.  States that patient was started on spironolactone , she had no urine output and gained about 6 to 10 pounds and after she stopped the spironolactone  after 4 to 5 days of use, she diuresed 2 gallons of urine.  Presently doing well and I had set this appointment in view of continued weight gain.  Cardiac Studies relevent.        Discussed the use of AI scribe software for clinical note transcription with the patient, who gave verbal consent to proceed.  History of Present Illness Joanna Alvarado is a 68 year old female with stage 3 kidney disease and liver disease who presents with swelling and a history of recurrent kidney infections. She is being referred to a liver transplant center for further evaluation of her liver disease.  She  experiences swelling under her eyes, in her feet, and the top of her legs. This occurs despite limiting water intake and not taking spironolactone . She has stopped spironolactone  due to fluid retention concerns and has run out of torsemide , previously taken at 40 mg twice daily. Her urine output increased significantly after stopping spironolactone , with dark brown-orange urine. Potassium levels remain stable with supplementation.  She takes midodrine  2.5 mg three times daily for orthostatic hypotension, having switched from losartan  due to low blood pressure. Her blood pressure is stable at 118/60.  She reports recent weight gain in her abdomen, back, and hips, despite dietary and fluid management efforts. No current shortness of breath and stable breathing. A1c levels are stable without diabetes medication.  Labs   Lab Results  Component Value Date   CHOL 119 09/30/2019   HDL 37 (L) 09/30/2019   LDLCALC 63 09/30/2019   TRIG 103 09/30/2019   CHOLHDL 3.2 09/30/2019   No results found for: LIPOA  Recent Labs    10/17/23 1009 11/12/23 1616 12/19/23 1025 02/01/24 1339  NA 144 140 142 145*  K 2.7* 4.4 2.9* 4.0  CL 107 108 105 109*  CO2 26 23 30 24   GLUCOSE 86 84 93 87  BUN 15 19 18 16   CREATININE 1.20* 1.10* 1.00 1.13*  CALCIUM  9.3 9.9 9.9 9.2  GFRNONAA 49* 54*  --   --     Lab Results  Component Value Date   ALT 19 12/19/2023   AST 42 (H) 12/19/2023   ALKPHOS 123 (H)  12/19/2023   BILITOT 2.0 (H) 12/19/2023      Latest Ref Rng & Units 12/19/2023   10:25 AM 11/12/2023    4:16 PM 10/17/2023   10:09 AM  CBC  WBC 4.0 - 10.5 K/uL 3.1  4.3  4.1   Hemoglobin 12.0 - 15.0 g/dL 88.1  87.8  88.0   Hematocrit 36.0 - 46.0 % 34.8  36.4  34.4   Platelets 150.0 - 400.0 K/uL 52.0  55  56    Lab Results  Component Value Date   HGBA1C 4.9 11/22/2021    Lab Results  Component Value Date   TSH 0.850 10/13/2021    ROS  Review of Systems  Cardiovascular:  Positive for dyspnea on  exertion (chronic). Negative for chest pain and leg swelling.  Gastrointestinal:  Positive for bloating (with water retension).   Physical Exam:   VS:  BP 118/60 (BP Location: Left Wrist, Patient Position: Sitting, Cuff Size: Normal)   Pulse 76   Resp 16   Ht 5' 8 (1.727 m)   Wt 273 lb (123.8 kg)   SpO2 96%   BMI 41.51 kg/m    Wt Readings from Last 3 Encounters:  02/07/24 273 lb (123.8 kg)  01/02/24 260 lb (117.9 kg)  12/19/23 270 lb (122.5 kg)    BP Readings from Last 3 Encounters:  02/07/24 118/60  12/19/23 120/68  11/12/23 (!) 111/50   Physical Exam Constitutional:      Comments: Morbidly obese in no acute distress.  Neck:     Vascular: No carotid bruit or JVD.  Cardiovascular:     Rate and Rhythm: Normal rate and regular rhythm.     Pulses: Intact distal pulses.          Carotid pulses are 2+ on the right side and 2+ on the left side.    Heart sounds: Murmur heard.     Midsystolic murmur is present with a grade of 2/6 at the upper right sternal border.     No gallop.  Pulmonary:     Effort: Pulmonary effort is normal.     Breath sounds: Normal breath sounds.  Abdominal:     General: Bowel sounds are normal.     Palpations: Abdomen is soft.     Comments: Obese. Pannus present  Musculoskeletal:     Right lower leg: No edema.     Left lower leg: No edema.    EKG:         ASSESSMENT AND PLAN: .      ICD-10-CM   1. Chronic diastolic heart failure (HCC)  P49.67 torsemide  40 MG TABS    2. Orthostatic hypotension  I95.1     3. Dyspnea on exertion  R06.09     4. Class 3 severe obesity due to excess calories with serious comorbidity and body mass index (BMI) of 40.0 to 44.9 in adult  E66.813    Z68.41      Assessment & Plan Chronic diastolic heart failure with volume overload and diuretic management Chronic diastolic heart failure, managed with diuretics. Recent fluid retention and swelling in the abdominal and thigh areas. Spironolactone  was discontinued  due to intolerance and in fact made her urine output to decrease. Current diuretic regimen includes torsemide , but she has run out of medication. Weight is stable post-diuretic use, but fluid retention is likely to recur without continued diuretic therapy. - Prescribe torsemide  40 mg, one tablet in the morning and one in the afternoon - Monitor weight and  adjust torsemide  dosage as needed based on fluid retention  Stage 3 chronic kidney disease with recurrent urinary tract infection -Precludes use of SGLT2 inhibitors.  Orthostatic hypotension on midodrine  therapy Orthostatic hypotension managed with midodrine  2.5 mg three times daily. Blood pressure is well-managed with current treatment. - Suspect this is due to aggressive diuresis and low volume state and do not suspect cardiac etiology.  Advanced liver disease, evaluation for transplant Advanced liver disease with ongoing evaluation for liver transplant. She is being referred to a liver transplant center for further management.  Obesity, class 3 in context of advanced liver and heart disease Class 3 obesity in the context of advanced liver and heart disease. Weight management is challenging due to contraindications for certain medications like Ozempic. Her weight has been gradually decreasing.  Follow up: 6 months or sooner if issues.  Signed,  Gordy Bergamo, MD, [facility] 02/07/2024, 4:56 PM [facility] [address] [place], [place] [address] Phone: [phone]. Fax:  [phone]

## 2024-02-07 NOTE — Patient Instructions (Signed)
 Medication Instructions:  Refilled Torsemide  40 mg   *If you need a refill on your cardiac medications before your next appointment, please call your pharmacy*  Lab Work: NONE If you have labs (blood work) drawn today and your tests are completely normal, you will receive your results only by: MyChart Message (if you have MyChart) OR A paper copy in the mail If you have any lab test that is abnormal or we need to change your treatment, we will call you to review the results.  Testing/Procedures: NONE  Follow-Up: At [facility], you and your health needs are our priority.  As part of our continuing mission to provide you with exceptional heart care, our providers are all part of one team.  This team includes your primary Cardiologist (physician) and Advanced Practice Providers or APPs (Physician Assistants and Nurse Practitioners) who all work together to provide you with the care you need, when you need it.  Your next appointment:   6 Months  Provider:   Gordy Bergamo, MD    We recommend signing up for the patient portal called MyChart.  Sign up information is provided on this After Visit Summary.  MyChart is used to connect with patients for Virtual Visits (Telemedicine).  Patients are able to view lab/test results, encounter notes, upcoming appointments, etc.  Non-urgent messages can be sent to your provider as well.   To learn more about what you can do with MyChart, go to [email or web address].

## 2024-03-03 DIAGNOSIS — R109 Unspecified abdominal pain: Secondary | ICD-10-CM | POA: Diagnosis not present

## 2024-03-03 DIAGNOSIS — Z6841 Body Mass Index (BMI) 40.0 and over, adult: Secondary | ICD-10-CM | POA: Diagnosis not present

## 2024-03-05 DIAGNOSIS — R109 Unspecified abdominal pain: Secondary | ICD-10-CM | POA: Diagnosis not present

## 2024-03-06 DIAGNOSIS — R600 Localized edema: Secondary | ICD-10-CM | POA: Diagnosis not present

## 2024-03-18 DIAGNOSIS — R1024 Suprapubic pain: Secondary | ICD-10-CM | POA: Diagnosis not present

## 2024-03-18 DIAGNOSIS — K5909 Other constipation: Secondary | ICD-10-CM | POA: Diagnosis not present

## 2024-03-18 DIAGNOSIS — N390 Urinary tract infection, site not specified: Secondary | ICD-10-CM | POA: Diagnosis not present

## 2024-03-18 DIAGNOSIS — N952 Postmenopausal atrophic vaginitis: Secondary | ICD-10-CM | POA: Diagnosis not present

## 2024-03-18 DIAGNOSIS — R351 Nocturia: Secondary | ICD-10-CM | POA: Diagnosis not present

## 2024-03-18 DIAGNOSIS — R339 Retention of urine, unspecified: Secondary | ICD-10-CM | POA: Diagnosis not present

## 2024-03-18 DIAGNOSIS — R600 Localized edema: Secondary | ICD-10-CM | POA: Diagnosis not present

## 2024-04-04 DIAGNOSIS — K7469 Other cirrhosis of liver: Secondary | ICD-10-CM | POA: Diagnosis present

## 2024-04-04 DIAGNOSIS — K766 Portal hypertension: Secondary | ICD-10-CM | POA: Diagnosis present

## 2024-04-04 DIAGNOSIS — I480 Paroxysmal atrial fibrillation: Secondary | ICD-10-CM | POA: Diagnosis present

## 2024-04-04 DIAGNOSIS — K7581 Nonalcoholic steatohepatitis (NASH): Secondary | ICD-10-CM | POA: Diagnosis not present

## 2024-04-04 DIAGNOSIS — I868 Varicose veins of other specified sites: Secondary | ICD-10-CM | POA: Diagnosis not present

## 2024-04-04 DIAGNOSIS — M797 Fibromyalgia: Secondary | ICD-10-CM | POA: Diagnosis present

## 2024-04-04 DIAGNOSIS — I509 Heart failure, unspecified: Secondary | ICD-10-CM

## 2024-04-07 ENCOUNTER — Other Ambulatory Visit (HOSPITAL_BASED_OUTPATIENT_CLINIC_OR_DEPARTMENT_OTHER): Payer: Self-pay | Admitting: Nurse Practitioner

## 2024-04-07 DIAGNOSIS — K7581 Nonalcoholic steatohepatitis (NASH): Secondary | ICD-10-CM

## 2024-04-07 DIAGNOSIS — K7469 Other cirrhosis of liver: Secondary | ICD-10-CM

## 2024-04-07 DIAGNOSIS — K766 Portal hypertension: Secondary | ICD-10-CM

## 2024-04-07 DIAGNOSIS — I868 Varicose veins of other specified sites: Secondary | ICD-10-CM

## 2024-04-14 ENCOUNTER — Ambulatory Visit (INDEPENDENT_AMBULATORY_CARE_PROVIDER_SITE_OTHER)
Admission: RE | Admit: 2024-04-14 | Discharge: 2024-04-14 | Disposition: A | Discharge status: Home / Self Care | Source: Ambulatory Visit | Attending: Nurse Practitioner | Admitting: Nurse Practitioner

## 2024-04-14 DIAGNOSIS — K766 Portal hypertension: Secondary | ICD-10-CM | POA: Diagnosis not present

## 2024-04-14 DIAGNOSIS — K7460 Unspecified cirrhosis of liver: Secondary | ICD-10-CM | POA: Diagnosis not present

## 2024-04-14 DIAGNOSIS — I868 Varicose veins of other specified sites: Secondary | ICD-10-CM | POA: Diagnosis not present

## 2024-04-14 DIAGNOSIS — K7581 Nonalcoholic steatohepatitis (NASH): Secondary | ICD-10-CM | POA: Diagnosis not present

## 2024-04-14 DIAGNOSIS — K760 Fatty (change of) liver, not elsewhere classified: Secondary | ICD-10-CM | POA: Diagnosis not present

## 2024-04-14 DIAGNOSIS — K7469 Other cirrhosis of liver: Secondary | ICD-10-CM | POA: Diagnosis not present

## 2024-04-15 ENCOUNTER — Emergency Department (HOSPITAL_COMMUNITY)

## 2024-04-15 ENCOUNTER — Inpatient Hospital Stay (HOSPITAL_COMMUNITY)
Admission: EM | Admit: 2024-04-15 | Discharge: 2024-04-19 | DRG: 442 | Disposition: A | Discharge status: Home / Self Care

## 2024-04-15 ENCOUNTER — Observation Stay (HOSPITAL_COMMUNITY)

## 2024-04-15 ENCOUNTER — Other Ambulatory Visit: Payer: Self-pay

## 2024-04-15 DIAGNOSIS — E876 Hypokalemia: Secondary | ICD-10-CM | POA: Diagnosis present

## 2024-04-15 DIAGNOSIS — M797 Fibromyalgia: Secondary | ICD-10-CM | POA: Diagnosis present

## 2024-04-15 DIAGNOSIS — I509 Heart failure, unspecified: Secondary | ICD-10-CM

## 2024-04-15 DIAGNOSIS — Z6838 Body mass index (BMI) 38.0-38.9, adult: Secondary | ICD-10-CM

## 2024-04-15 DIAGNOSIS — Z7401 Bed confinement status: Secondary | ICD-10-CM | POA: Diagnosis not present

## 2024-04-15 DIAGNOSIS — I6782 Cerebral ischemia: Secondary | ICD-10-CM | POA: Diagnosis not present

## 2024-04-15 DIAGNOSIS — E861 Hypovolemia: Secondary | ICD-10-CM | POA: Diagnosis present

## 2024-04-15 DIAGNOSIS — N1831 Chronic kidney disease, stage 3a: Secondary | ICD-10-CM | POA: Diagnosis not present

## 2024-04-15 DIAGNOSIS — Z8249 Family history of ischemic heart disease and other diseases of the circulatory system: Secondary | ICD-10-CM

## 2024-04-15 DIAGNOSIS — E872 Acidosis, unspecified: Secondary | ICD-10-CM | POA: Diagnosis present

## 2024-04-15 DIAGNOSIS — R9389 Abnormal findings on diagnostic imaging of other specified body structures: Secondary | ICD-10-CM | POA: Diagnosis not present

## 2024-04-15 DIAGNOSIS — Z808 Family history of malignant neoplasm of other organs or systems: Secondary | ICD-10-CM

## 2024-04-15 DIAGNOSIS — Z91040 Latex allergy status: Secondary | ICD-10-CM

## 2024-04-15 DIAGNOSIS — K7469 Other cirrhosis of liver: Secondary | ICD-10-CM | POA: Diagnosis present

## 2024-04-15 DIAGNOSIS — Z91048 Other nonmedicinal substance allergy status: Secondary | ICD-10-CM

## 2024-04-15 DIAGNOSIS — N281 Cyst of kidney, acquired: Secondary | ICD-10-CM | POA: Diagnosis not present

## 2024-04-15 DIAGNOSIS — K8689 Other specified diseases of pancreas: Secondary | ICD-10-CM | POA: Diagnosis not present

## 2024-04-15 DIAGNOSIS — D509 Iron deficiency anemia, unspecified: Secondary | ICD-10-CM | POA: Diagnosis present

## 2024-04-15 DIAGNOSIS — Z79890 Hormone replacement therapy (postmenopausal): Secondary | ICD-10-CM

## 2024-04-15 DIAGNOSIS — K766 Portal hypertension: Secondary | ICD-10-CM | POA: Diagnosis not present

## 2024-04-15 DIAGNOSIS — K760 Fatty (change of) liver, not elsewhere classified: Secondary | ICD-10-CM | POA: Diagnosis present

## 2024-04-15 DIAGNOSIS — K7682 Hepatic encephalopathy: Principal | ICD-10-CM | POA: Diagnosis present

## 2024-04-15 DIAGNOSIS — E7800 Pure hypercholesterolemia, unspecified: Secondary | ICD-10-CM | POA: Diagnosis present

## 2024-04-15 DIAGNOSIS — R410 Disorientation, unspecified: Principal | ICD-10-CM

## 2024-04-15 DIAGNOSIS — I480 Paroxysmal atrial fibrillation: Secondary | ICD-10-CM | POA: Diagnosis not present

## 2024-04-15 DIAGNOSIS — Z823 Family history of stroke: Secondary | ICD-10-CM

## 2024-04-15 DIAGNOSIS — Z881 Allergy status to other antibiotic agents status: Secondary | ICD-10-CM

## 2024-04-15 DIAGNOSIS — K219 Gastro-esophageal reflux disease without esophagitis: Secondary | ICD-10-CM | POA: Diagnosis not present

## 2024-04-15 DIAGNOSIS — E1122 Type 2 diabetes mellitus with diabetic chronic kidney disease: Secondary | ICD-10-CM | POA: Diagnosis present

## 2024-04-15 DIAGNOSIS — Z953 Presence of xenogenic heart valve: Secondary | ICD-10-CM

## 2024-04-15 DIAGNOSIS — R4182 Altered mental status, unspecified: Secondary | ICD-10-CM | POA: Diagnosis not present

## 2024-04-15 DIAGNOSIS — I10 Essential (primary) hypertension: Secondary | ICD-10-CM | POA: Diagnosis not present

## 2024-04-15 DIAGNOSIS — G9340 Encephalopathy, unspecified: Principal | ICD-10-CM | POA: Diagnosis present

## 2024-04-15 DIAGNOSIS — G4733 Obstructive sleep apnea (adult) (pediatric): Secondary | ICD-10-CM | POA: Diagnosis present

## 2024-04-15 DIAGNOSIS — Z7983 Long term (current) use of bisphosphonates: Secondary | ICD-10-CM

## 2024-04-15 DIAGNOSIS — R404 Transient alteration of awareness: Secondary | ICD-10-CM | POA: Diagnosis not present

## 2024-04-15 DIAGNOSIS — I951 Orthostatic hypotension: Secondary | ICD-10-CM | POA: Diagnosis present

## 2024-04-15 DIAGNOSIS — R6510 Systemic inflammatory response syndrome (SIRS) of non-infectious origin without acute organ dysfunction: Secondary | ICD-10-CM | POA: Diagnosis present

## 2024-04-15 DIAGNOSIS — Z860100 Personal history of colon polyps, unspecified: Secondary | ICD-10-CM

## 2024-04-15 DIAGNOSIS — I5032 Chronic diastolic (congestive) heart failure: Secondary | ICD-10-CM | POA: Diagnosis present

## 2024-04-15 DIAGNOSIS — K7460 Unspecified cirrhosis of liver: Secondary | ICD-10-CM | POA: Diagnosis not present

## 2024-04-15 DIAGNOSIS — K5900 Constipation, unspecified: Secondary | ICD-10-CM | POA: Diagnosis present

## 2024-04-15 DIAGNOSIS — I130 Hypertensive heart and chronic kidney disease with heart failure and stage 1 through stage 4 chronic kidney disease, or unspecified chronic kidney disease: Secondary | ICD-10-CM | POA: Diagnosis present

## 2024-04-15 DIAGNOSIS — Z885 Allergy status to narcotic agent status: Secondary | ICD-10-CM

## 2024-04-15 DIAGNOSIS — E860 Dehydration: Secondary | ICD-10-CM | POA: Diagnosis present

## 2024-04-15 DIAGNOSIS — Z888 Allergy status to other drugs, medicaments and biological substances status: Secondary | ICD-10-CM

## 2024-04-15 DIAGNOSIS — I517 Cardiomegaly: Secondary | ICD-10-CM | POA: Diagnosis not present

## 2024-04-15 DIAGNOSIS — E119 Type 2 diabetes mellitus without complications: Secondary | ICD-10-CM

## 2024-04-15 DIAGNOSIS — Z833 Family history of diabetes mellitus: Secondary | ICD-10-CM

## 2024-04-15 DIAGNOSIS — N183 Chronic kidney disease, stage 3 unspecified: Secondary | ICD-10-CM | POA: Diagnosis present

## 2024-04-15 DIAGNOSIS — R000 Tachycardia, unspecified: Secondary | ICD-10-CM | POA: Diagnosis not present

## 2024-04-15 DIAGNOSIS — E039 Hypothyroidism, unspecified: Secondary | ICD-10-CM | POA: Diagnosis present

## 2024-04-15 DIAGNOSIS — K5730 Diverticulosis of large intestine without perforation or abscess without bleeding: Secondary | ICD-10-CM | POA: Diagnosis not present

## 2024-04-15 DIAGNOSIS — D631 Anemia in chronic kidney disease: Secondary | ICD-10-CM | POA: Diagnosis present

## 2024-04-15 DIAGNOSIS — Z83719 Family history of colon polyps, unspecified: Secondary | ICD-10-CM

## 2024-04-15 DIAGNOSIS — Z79899 Other long term (current) drug therapy: Secondary | ICD-10-CM

## 2024-04-15 DIAGNOSIS — R918 Other nonspecific abnormal finding of lung field: Secondary | ICD-10-CM | POA: Diagnosis not present

## 2024-04-15 DIAGNOSIS — D696 Thrombocytopenia, unspecified: Secondary | ICD-10-CM | POA: Diagnosis present

## 2024-04-15 LAB — COMPREHENSIVE METABOLIC PANEL WITH GFR
ALT: 20 U/L (ref 0–44)
AST: 51 U/L — ABNORMAL HIGH (ref 15–41)
Albumin: 3.2 g/dL — ABNORMAL LOW (ref 3.5–5.0)
Alkaline Phosphatase: 159 U/L — ABNORMAL HIGH (ref 38–126)
Anion gap: 12 (ref 5–15)
BUN: 13 mg/dL (ref 8–23)
CO2: 22 mmol/L (ref 22–32)
Calcium: 9.7 mg/dL (ref 8.9–10.3)
Chloride: 106 mmol/L (ref 98–111)
Creatinine, Ser: 1.33 mg/dL — ABNORMAL HIGH (ref 0.44–1.00)
GFR, Estimated: 43 mL/min — ABNORMAL LOW (ref >60)
Glucose, Bld: 139 mg/dL — ABNORMAL HIGH (ref 70–99)
Potassium: 3.4 mmol/L — ABNORMAL LOW (ref 3.5–5.1)
Sodium: 140 mmol/L (ref 135–145)
Total Bilirubin: 2.5 mg/dL — ABNORMAL HIGH (ref 0.0–1.2)
Total Protein: 5.8 g/dL — ABNORMAL LOW (ref 6.5–8.1)

## 2024-04-15 LAB — PROTIME-INR
INR: 1.4 — ABNORMAL HIGH (ref 0.8–1.2)
Prothrombin Time: 18 s — ABNORMAL HIGH (ref 11.4–15.2)

## 2024-04-15 LAB — CBC WITH DIFFERENTIAL/PLATELET
Abs Immature Granulocytes: 0.02 K/uL (ref 0.00–0.07)
Basophils Absolute: 0.1 K/uL (ref 0.0–0.1)
Basophils Relative: 1 %
Eosinophils Absolute: 0 K/uL (ref 0.0–0.5)
Eosinophils Relative: 0 %
HCT: 37.9 % (ref 36.0–46.0)
Hemoglobin: 12.8 g/dL (ref 12.0–15.0)
Immature Granulocytes: 0 %
Lymphocytes Relative: 13 %
Lymphs Abs: 0.8 K/uL (ref 0.7–4.0)
MCH: 31.1 pg (ref 26.0–34.0)
MCHC: 33.8 g/dL (ref 30.0–36.0)
MCV: 92 fL (ref 80.0–100.0)
Monocytes Absolute: 0.4 K/uL (ref 0.1–1.0)
Monocytes Relative: 7 %
Neutro Abs: 4.6 K/uL (ref 1.7–7.7)
Neutrophils Relative %: 79 %
Platelets: 54 K/uL — ABNORMAL LOW (ref 150–400)
RBC: 4.12 MIL/uL (ref 3.87–5.11)
RDW: 14.6 % (ref 11.5–15.5)
WBC: 5.9 K/uL (ref 4.0–10.5)
nRBC: 0 % (ref 0.0–0.2)

## 2024-04-15 LAB — HIV ANTIBODY (ROUTINE TESTING W REFLEX): HIV Screen 4th Generation wRfx: NONREACTIVE

## 2024-04-15 LAB — CBG MONITORING, ED
Glucose-Capillary: 95 mg/dL (ref 70–99)
Glucose-Capillary: 96 mg/dL (ref 70–99)

## 2024-04-15 LAB — I-STAT CG4 LACTIC ACID, ED
Lactic Acid, Venous: 4.8 mmol/L (ref 0.5–1.9)
Lactic Acid, Venous: 2.8 mmol/L (ref 0.5–1.9)
Lactic Acid, Venous: 2.3 mmol/L (ref 0.5–1.9)

## 2024-04-15 LAB — GLUCOSE, CAPILLARY: Glucose-Capillary: 87 mg/dL (ref 70–99)

## 2024-04-15 LAB — T4, FREE: Free T4: 0.93 ng/dL (ref 0.61–1.12)

## 2024-04-15 LAB — TSH: TSH: 11.704 u[IU]/mL — ABNORMAL HIGH (ref 0.350–4.500)

## 2024-04-15 LAB — AMMONIA: Ammonia: 81 umol/L — ABNORMAL HIGH (ref 9–35)

## 2024-04-15 MED ORDER — ACETAMINOPHEN 650 MG RE SUPP: 650.0000 mg | Freq: Four times a day (QID) | RECTAL | Status: DC | PRN

## 2024-04-15 MED ORDER — SODIUM CHLORIDE 0.9% FLUSH: 3.0000 mL | Freq: Two times a day (BID) | INTRAVENOUS | Status: DC

## 2024-04-15 MED ORDER — LORAZEPAM 2 MG/ML IJ SOLN
0.5000 mg | Freq: Four times a day (QID) | INTRAMUSCULAR | Status: AC | PRN
  Administered 2024-04-15: 0.5 mg via INTRAVENOUS
  Filled 2024-04-15 (×2): qty 1

## 2024-04-15 MED ORDER — LACTULOSE 10 GM/15ML PO SOLN
20.0000 g | Freq: Three times a day (TID) | ORAL | Status: DC
  Administered 2024-04-15 – 2024-04-16 (×5): 20 g via ORAL
  Filled 2024-04-15 (×5): qty 30

## 2024-04-15 MED ORDER — LEVOTHYROXINE SODIUM 75 MCG PO TABS: 150.0000 ug | ORAL_TABLET | Freq: Every day | ORAL | Status: DC

## 2024-04-15 MED ORDER — ALBUTEROL SULFATE (2.5 MG/3ML) 0.083% IN NEBU
3.0000 mL | INHALATION_SOLUTION | RESPIRATORY_TRACT | Status: DC | PRN
Start: 2024-04-15 — End: 2024-04-19

## 2024-04-15 MED ORDER — IOHEXOL 350 MG/ML SOLN
75.0000 mL | Freq: Once | INTRAVENOUS | Status: AC | PRN
  Administered 2024-04-15: 75 mL via INTRAVENOUS

## 2024-04-15 MED ORDER — SODIUM CHLORIDE 0.9 % IV BOLUS
250.0000 mL | Freq: Once | INTRAVENOUS | Status: AC
  Administered 2024-04-15: 250 mL via INTRAVENOUS

## 2024-04-15 MED ORDER — INSULIN ASPART 100 UNIT/ML IJ SOLN: 0.0000 [IU] | Freq: Three times a day (TID) | INTRAMUSCULAR | Status: DC

## 2024-04-15 MED ORDER — PANTOPRAZOLE SODIUM 40 MG PO TBEC
40.0000 mg | DELAYED_RELEASE_TABLET | Freq: Every day | ORAL | Status: DC
  Administered 2024-04-16 – 2024-04-19 (×4): 40 mg via ORAL
  Filled 2024-04-15 (×4): qty 1

## 2024-04-15 MED ORDER — MIDODRINE HCL 5 MG PO TABS
5.0000 mg | ORAL_TABLET | Freq: Three times a day (TID) | ORAL | Status: DC
  Administered 2024-04-16 – 2024-04-19 (×10): 5 mg via ORAL
  Filled 2024-04-15 (×10): qty 1

## 2024-04-15 MED ORDER — ROSUVASTATIN CALCIUM 5 MG PO TABS
5.0000 mg | ORAL_TABLET | Freq: Every day | ORAL | Status: DC
  Administered 2024-04-16 – 2024-04-19 (×4): 5 mg via ORAL
  Filled 2024-04-15 (×4): qty 1

## 2024-04-15 MED ORDER — ACETAMINOPHEN 325 MG PO TABS
650.0000 mg | ORAL_TABLET | Freq: Four times a day (QID) | ORAL | Status: DC | PRN
  Administered 2024-04-16 – 2024-04-18 (×4): 650 mg via ORAL
  Filled 2024-04-15 (×5): qty 2

## 2024-04-15 MED ORDER — LEVOTHYROXINE SODIUM 25 MCG PO TABS
137.0000 ug | ORAL_TABLET | Freq: Every day | ORAL | Status: DC
  Administered 2024-04-16 – 2024-04-19 (×4): 137 ug via ORAL
  Filled 2024-04-15 (×4): qty 1

## 2024-04-15 MED ORDER — SODIUM CHLORIDE 0.9 % IV SOLN
2.0000 g | Freq: Once | INTRAVENOUS | Status: AC
  Administered 2024-04-15: 2 g via INTRAVENOUS
  Filled 2024-04-15: qty 20

## 2024-04-15 NOTE — ED Provider Notes (Signed)
 [place] EMERGENCY DEPARTMENT AT [facility] Provider Note   CSN: [id number] Arrival date & time: 04/15/24  1214     Patient presents with: Altered Mental Status   Joanna Alvarado is a 68 y.o. female.    Altered Mental Status Associated symptoms: no abdominal pain, no fever, no palpitations, no rash, no seizures and no vomiting      Patient presents because altered mental status.  Per EMS, they were called because of increasing altered mental status.  No reported falls.  No fever or chills that family had relayed.  Husband is on the way to the hospital.  EMS states that urine was dark and smelled at the house.  Patient states that she does not hurt anywhere   Previous medical history reviewed : Last admitted in 2023.  Moderate-severe aortic insufficiency.  Status post aortic valve replacement.  Last follow-up cardiology in September 2025. past medical history of hypertension with stage IIIa chronic kidney disease, severe aortic regurgitation S/P BIOPROSTHETIC AV replacement 11/24/2021, chronic thrombocytopenia, morbid obesity and OSA on CPAP, GERD, hiatal hernia, hypercholesterolemia, hypothyroidism and advanced liver disease with mild cirrhosis of liver due to fatty liver and chronic hypotension for which she is on midodrine         Prior to Admission medications   Medication Sig Start Date End Date Taking? Authorizing Provider  acetaminophen  (TYLENOL ) 500 MG tablet Take 1,000 mg by mouth every 6 (six) hours as needed for moderate pain.    [provider]  albuterol  (VENTOLIN  HFA) 108 (90 Base) MCG/ACT inhaler Inhale 2 puffs into the lungs every 4 (four) hours as needed for wheezing or shortness of breath.    [provider]  alendronate (FOSAMAX) 10 MG tablet Take 10 mg by mouth once a week. Take with a full glass of water on an empty stomach.    [provider]  ergocalciferol (VITAMIN D2) 1.25 MG (50000 UT) capsule Take 50,000 Units by  mouth every Tuesday.    [provider]  lactulose  (CHRONULAC ) 10 GM/15ML solution Take 15-30 ml up to twice daily for constipation 04/17/23   Collier, Amanda R, PA-C  levothyroxine  (SYNTHROID ) 150 MCG tablet Take 150 mcg by mouth daily before breakfast.    [provider]  lidocaine  (LIDODERM ) 5 % Place 1 patch onto the skin daily as needed (Knee pain). Remove & Discard patch within 12 hours or as directed by MD    [provider]  linaclotide  (LINZESS ) 290 MCG CAPS capsule Take 1 capsule (290 mcg total) by mouth daily before breakfast. 01/03/23   Abran Norleen SAILOR, MD  metoprolol  tartrate (LOPRESSOR ) 25 MG tablet Take 1 tablet (25 mg total) by mouth 3 (three) times daily as needed (Palpitations). 02/06/23 11/11/24  Ladona Heinz, MD  midodrine  (PROAMATINE ) 5 MG tablet Take 1 tablet (5 mg total) by mouth 3 (three) times daily with meals. 10/10/23   Ladona Heinz, MD  nitroGLYCERIN  (NITROSTAT ) 0.4 MG SL tablet Place 1 tablet (0.4 mg total) under the tongue every 5 (five) minutes as needed for chest pain. 02/06/23   Ladona Heinz, MD  omeprazole  (PRILOSEC) 40 MG capsule Take 1 capsule (40 mg total) by mouth daily. 12/19/23   Kennedy-Smith, Colleen M, NP  polyethylene glycol (MIRALAX  / GLYCOLAX ) 17 g packet Take 17 g by mouth daily.    [provider]  potassium chloride  SA (KLOR-CON  M) 20 MEQ tablet Take 40 mg twice daily for one week. After one week, decrease to 20 mg three  times a day. 01/25/24   Ladona Heinz, MD  rosuvastatin  (CRESTOR ) 5 MG tablet Take 1 tablet (5 mg total) by mouth daily. 09/28/23   Ladona Heinz, MD  torsemide  40 MG TABS Take 40 mg by mouth in the morning and at bedtime. 1 tab in Am and one tab in afternoon 02/07/24   Ladona Heinz, MD  traMADol  (ULTRAM ) 50 MG tablet Take 1 tablet (50 mg total) by mouth every 6 (six) hours as needed for moderate pain. 11/29/22   Kerrin Elspeth BROCKS, MD  traZODone (DESYREL) 50 MG tablet Take 50 mg by mouth at bedtime. 07/06/23   [provider]    Allergies: Adhesive [tape], Psyllium, Spironolactone , Trulicity [dulaglutide], Latex, Oxycontin [oxycodone hcl], Hydrocodone -acetaminophen , Ciprofloxacin, Codeine, and Other    Review of Systems  Constitutional:  Negative for chills and fever.  HENT:  Negative for ear pain and sore throat.   Eyes:  Negative for pain and visual disturbance.  Respiratory:  Negative for cough and shortness of breath.   Cardiovascular:  Negative for chest pain and palpitations.  Gastrointestinal:  Negative for abdominal pain and vomiting.  Genitourinary:  Negative for dysuria and hematuria.  Musculoskeletal:  Negative for arthralgias and back pain.  Skin:  Negative for color change and rash.  Neurological:  Negative for seizures and syncope.  All other systems reviewed and are negative.   Updated Vital Signs BP 125/72   Pulse (!) 105   Temp 98.4 F (36.9 C) (Oral)   Resp 20   Ht 5' 9 (1.753 m)   Wt 118.8 kg   SpO2 100%   BMI 38.69 kg/m   Physical Exam Vitals and nursing note reviewed.  Constitutional:      General: She is not in acute distress.    Appearance: She is well-developed.  HENT:     Head: Normocephalic and atraumatic.  Eyes:     Conjunctiva/sclera: Conjunctivae normal.  Cardiovascular:     Rate and Rhythm: Normal rate and regular rhythm.     Heart sounds: No murmur heard. Pulmonary:     Effort: Pulmonary effort is normal. No respiratory distress.     Breath sounds: Normal breath sounds.  Abdominal:     Palpations: Abdomen is soft.     Tenderness: There is no abdominal tenderness.  Musculoskeletal:        General: No swelling.     Cervical back: Neck supple.  Skin:    General: Skin is warm and dry.     Capillary Refill: Capillary refill takes less than 2 seconds.  Neurological:     Mental Status: She is alert.  Psychiatric:        Mood and Affect: Mood normal.     (all labs ordered are listed, but only abnormal results are displayed) Labs Reviewed   CBC WITH DIFFERENTIAL/PLATELET - Abnormal; Notable for the following components:      Result Value   Platelets 54 (*)    All other components within normal limits  COMPREHENSIVE METABOLIC PANEL WITH GFR - Abnormal; Notable for the following components:   Potassium 3.4 (*)    Glucose, Bld 139 (*)    Creatinine, Ser 1.33 (*)    Total Protein 5.8 (*)    Albumin  3.2 (*)    AST 51 (*)    Alkaline Phosphatase 159 (*)    Total Bilirubin 2.5 (*)    GFR, Estimated 43 (*)    All other components within normal limits  AMMONIA - Abnormal; Notable for  the following components:   Ammonia 81 (*)    All other components within normal limits  TSH - Abnormal; Notable for the following components:   TSH 11.704 (*)    All other components within normal limits  I-STAT CG4 LACTIC ACID, ED - Abnormal; Notable for the following components:   Lactic Acid, Venous 4.8 (*)    All other components within normal limits  CULTURE, BLOOD (ROUTINE X 2)  CULTURE, BLOOD (ROUTINE X 2)  URINALYSIS, ROUTINE W REFLEX MICROSCOPIC  T3, FREE  T4, FREE  HIV ANTIBODY (ROUTINE TESTING W REFLEX)  PROTIME-INR  CBG MONITORING, ED  CBG MONITORING, ED  I-STAT CG4 LACTIC ACID, ED    EKG: EKG Interpretation Date/Time:  Tuesday April 15 2024 12:44:35 EST Ventricular Rate:  83 PR Interval:  175 QRS Duration:  131 QT Interval:  439 QTC Calculation: 516 R Axis:   -59  Text Interpretation: Sinus rhythm Nonspecific IVCD with LAD LVH with secondary repolarization abnormality Anterior Q waves, possibly due to LVH Confirmed by Simon Rea [phone]) on 04/15/2024 12:57:44 PM  Radiology: [facility] Chest Port 1 View Result Date: 04/15/2024 EXAM: 1 VIEW(S) XRAY OF THE CHEST 04/15/2024 01:02:00 PM COMPARISON: 11/12/2023 CLINICAL HISTORY: AMS (altered mental status) FINDINGS: LUNGS AND PLEURA: Chronic elevation of the left hemidiaphragm with left basilar opacity, favor to reflect atelectasis. No overt pulmonary edema. No sizeable  pleural effusion. No pneumothorax. HEART AND MEDIASTINUM: Mild cardiomegaly. Prosthetic aortic valve. Median sternotomy. BONES AND SOFT TISSUES: No acute osseous abnormality. IMPRESSION: 1. Chronic elevation of the left hemidiaphragm with left basilar opacity, favored to reflect atelectasis. 2. Mild cardiomegaly. Electronically signed by: Shahmeer Lateef MD 04/15/2024 01:30 PM EST RP Workstation: [id number]   CT Head Wo Contrast Result Date: 04/15/2024 EXAM: CT HEAD WITHOUT CONTRAST 04/15/2024 12:43:00 PM TECHNIQUE: CT of the head was performed without the administration of intravenous contrast. Automated exposure control, iterative reconstruction, and/or weight based adjustment of the mA/kV was utilized to reduce the radiation dose to as low as reasonably achievable. COMPARISON: None available. CLINICAL HISTORY: Altered mental status. FINDINGS: BRAIN AND VENTRICLES: There is no evidence of an acute infarct, intracranial hemorrhage, mass, midline shift, hydrocephalus, or extra-axial fluid collection. Cerebral volume is normal. Cerebral white matter hypodensities are nonspecific but compatible with mild chronic small vessel ischemic disease. Calcified atherosclerosis at the skull base. ORBITS: Bilateral cataract extraction. SINUSES: No acute abnormality. SOFT TISSUES AND SKULL: No acute soft tissue abnormality. No skull fracture. IMPRESSION: 1. No acute intracranial abnormality. 2. Mild chronic small vessel ischemic disease. Electronically signed by: Dasie Hamburg MD 04/15/2024 01:11 PM EST RP Workstation: [id number]     Procedures   Medications Ordered in the ED  lactulose  (CHRONULAC ) 10 GM/15ML solution 20 g (20 g Oral Given 04/15/24 1430)  cefTRIAXone (ROCEPHIN) 2 g in sodium chloride  0.9 % 100 mL IVPB (has no administration in time range)  sodium chloride  0.9 % bolus 250 mL (has no administration in time range)  rosuvastatin  (CRESTOR ) tablet 5 mg (has no administration in time range)  levothyroxine   (SYNTHROID ) tablet 150 mcg (has no administration in time range)  pantoprazole  (PROTONIX ) EC tablet 40 mg (has no administration in time range)  albuterol  (PROVENTIL ) (2.5 MG/3ML) 0.083% nebulizer solution 3 mL (has no administration in time range)  midodrine  (PROAMATINE ) tablet 5 mg (has no administration in time range)  sodium chloride  flush (NS) 0.9 % injection 3 mL (has no administration in time range)  acetaminophen  (TYLENOL ) tablet 650 mg (has no administration in time range)  Or  acetaminophen  (TYLENOL ) suppository 650 mg (has no administration in time range)  insulin  aspart (novoLOG ) injection 0-15 Units (has no administration in time range)  iohexol  (OMNIPAQUE ) 350 MG/ML injection 75 mL (75 mLs Intravenous Contrast Given 04/15/24 1541)                                    Medical Decision Making Amount and/or Complexity of Data Reviewed Labs: ordered. Radiology: ordered.  Risk Decision regarding hospitalization.     HPI:    Patient presents because altered mental status.  Per EMS, they were called because of increasing altered mental status.  No reported falls.  No fever or chills that family had relayed.  Husband is on the way to the hospital.  EMS states that urine was dark and smelled at the house.  Patient states that she does not hurt anywhere   Previous medical history reviewed : Last admitted in 2023.  Moderate-severe aortic insufficiency.  Status post aortic valve replacement.  Last follow-up cardiology in September 2025. past medical history of hypertension with stage IIIa chronic kidney disease, severe aortic regurgitation S/P BIOPROSTHETIC AV replacement 11/24/2021, chronic thrombocytopenia, morbid obesity and OSA on CPAP, GERD, hiatal hernia, hypercholesterolemia, hypothyroidism and advanced liver disease with mild cirrhosis of liver due to fatty liver and chronic hypotension for which she is on midodrine     MDM:   Upon exam, patient alert to self but not  time or place.  No focal deficits.  Moving all extremities.  Smiling and asking questions but confused.  No concerns for CVA at this time.  Plan will be for infectious workup with UA as well as chest x-ray.  Laboratory workup to assessment, large electrolyte derangements including hyponatremia.  Will obtain CT head rule and cons of spontaneous bleed though I think this is unlikely.  Also obtain ammonia.  She does have some asterixis in bilateral hands and maybe some slight clonus in her lower extremities.   Reevaluation:   Upon reexamination, patient hemodynamically stable.  Remains altered.  CT head showed no evidence of subdural epidural.  Ammonia elevated.  I think this does fit patient's clinical picture with asterixis and elevated ammonia with known history of cirrhosis.  Therefore, will start patient on lactulose .  Slight AKI.  Creatinine 1.33.  Baseline around 1.1.  Will give small bolus of fluid.  Lactic acid elevated as well.  Could be because of the liver failure and inability to metabolize the lactic acid but given undifferentiated presentation, will obtain blood cultures and started on ceftriaxone to cover for SBP as well as UTI.  I think this is unlikely at this point time.  Soft and benign abdomen.  No rebound guarding or tenderness.  No indication to obtain CT scan abdomen is point time.  TSH elevated.  T3-T4 added.  Need to be followed.  Patient admitted for further care.     Interventions: ceftriaxone, lactulose    EKG Interpreted by Me: sinus    Cardiac Tele Interpreted by Me: sinus    I have independently interpreted the CXR  and CT  images and agree with the radiologist finding      Disposition and Follow Up: admit    CRITICAL CARE Performed by: Lavonia LOISE Pat   Total critical care time: 44 minutes  Critical care time was exclusive of separately billable procedures and treating other patients.  Critical care was necessary to treat or  prevent imminent or  life-threatening deterioration.  Critical care was time spent personally by me on the following activities: development of treatment plan with patient and/or surrogate as well as nursing, discussions with consultants, evaluation of patient's response to treatment, examination of patient, obtaining history from patient or surrogate, ordering and performing treatments and interventions, ordering and review of laboratory studies, ordering and review of radiographic studies, pulse oximetry and re-evaluation of patient's condition.       Final diagnoses:  None    ED Discharge Orders     None          Simon Lavonia SAILOR, MD 04/15/24 1544

## 2024-04-15 NOTE — ED Triage Notes (Signed)
 Pt BIB [facility] from home with c/o being altered since this morning per husband. Pt normally A/Ox4. PMHx of UTIs. Has not been eating or drinking well x days per husband. Pt had a cardiac procedure several years ago repairing her arch per EMS. Pt now not following commands and continues to repeat things.   BP 140/65 HR 87 O2 99% on RA EtCO2 30

## 2024-04-15 NOTE — H&P (Addendum)
 History and Physical   [name] FMW:[id number] DOB: 25-Sep-1955 DOA: 04/15/2024  PCP: Joanna Marcellus RAMAN, MD   Patient coming from: Home  Chief Complaint: Altered mental status  HPI: Joanna Alvarado is a 68 y.o. female with medical history significant of atrial fibrillation, diabetes, CKD 3, hypothyroidism, GERD, status post aortic valve replacement, diastolic CHF, cirrhosis, anemia, orthostatic hypotension, fibromyalgia presenting with altered mental status.  History obtained with assistance of chart review and family due to patient's altered mental status.  Patient is alert and oriented x 4 at baseline.  Does have history of previous UTI.  Has had decreased p.o. intake for the last several days.  Patient was found to be repeating things and not following directions appropriately for EMS.  Patient has known liver disease and follows with [facility] transplant.  Patient unable to fully participate in review of systems due to altered mentation.  ED Course: Vital signs in the ED notable for heart rate in the 80s-100s.  Blood pressure in the 120s-130 systolic.  Lab workup included CMP with potassium 3.4, creatinine elevated to 1.33 from baseline of 1.1, glucose 139, protein 5.8, albumin  3.2, AST 51, alk phos 159, T. bili 2.5.  CBC with platelets stable at 54.  TSH 11.7.  T4 and T3 pending.  Lactic acid initially elevated at 4.3.  Repeat pending.  Urinalysis pending.  Ammonia level 81.  Chest x-ray showed chronic left hemidiaphragm elevation and mild cardiomegaly.  CT head showed no acute abnormality.  Patient received lactulose .  Lactic acid came back elevated which is being repeated but patient has been ordered small bolus of IV fluids and ceftriaxone.  Review of Systems: Patient unable to fully participate in review of systems due to altered mentation.  Past Medical History:  Diagnosis Date   Abnormal myocardial perfusion study 09/26/2019   Anemia    Angina pectoris (HCC) - Class II-III  09/26/2019   Aortic valve insufficiency    Arthritis    KNEES   Benign neoplasm of ascending colon    Benign neoplasm of cecum    Benign neoplasm of descending colon    Benign neoplasm of sigmoid colon    Benign neoplasm of transverse colon    Bowel habit changes    Cataract    BILATERAL-REMOVED   CHF (congestive heart failure) (HCC)    Chronic postoperative pain 12/02/2012   Complication of anesthesia    Difficult intubation    Disturbance of skin sensation 12/02/2012   Fibromyalgia    GERD (gastroesophageal reflux disease)    Head injury    scalp injury x2 required sutures   Headache    prior to hyster   Heart murmur    History of blood transfusion    History of colonic polyps    History of hiatal hernia    History of kidney stones    History of transfusion of platelets 11/2021   Hypertension    Hypothyroidism    Idiopathic thrombocytopenic purpura (ITP) (HCC)    Myalgia and myositis, unspecified 12/02/2012   Personal history of colonic polyps    Pneumonia    Primary hypothyroidism 11/30/2015   Senile nuclear sclerosis 04/24/2014   Sleep apnea    Thyroid  disease    Thyroid  nodule 11/30/2015   Vitamin D deficiency 11/30/2015    Past Surgical History:  Procedure Laterality Date   ABDOMINAL HYSTERECTOMY     AORTIC VALVE REPLACEMENT N/A 11/24/2021   Procedure: AORTIC VALVE REPLACEMENT (AVR) USING INSPIRIS RESILIA  AORTIC  VALVE;  Surgeon: Obadiah Coy, MD;  Location: [facility];  Service: Open Heart Surgery;  Laterality: N/A;   APPENDECTOMY     CHOLECYSTECTOMY     COLONOSCOPY N/A 07/27/2015   Procedure: COLONOSCOPY;  Surgeon: Norleen LOISE Kiang, MD;  Location: [facility];  Service: Endoscopy;  Laterality: N/A;   COLONOSCOPY WITH PROPOFOL  N/A 04/28/2019   Procedure: COLONOSCOPY WITH PROPOFOL ;  Surgeon: Kiang Norleen LOISE, MD;  Location: [facility];  Service: Endoscopy;  Laterality: N/A;   COLONOSCOPY WITH PROPOFOL  N/A 01/01/2023   Procedure: COLONOSCOPY WITH PROPOFOL ;   Surgeon: Kiang Norleen LOISE, MD;  Location: [facility];  Service: Gastroenterology;  Laterality: N/A;   ESOPHAGOGASTRODUODENOSCOPY (EGD) WITH PROPOFOL  N/A 05/16/2023   Procedure: ESOPHAGOGASTRODUODENOSCOPY (EGD) WITH PROPOFOL ;  Surgeon: Kiang Norleen LOISE, MD;  Location: [facility];  Service: Gastroenterology;  Laterality: N/A;  difficult intubation   EYE SURGERY     bilateral cataracts with lens implants   floater bone surgery     bone removed from right hand   FOOT SURGERY     RIGHT   FOOT SURGERY Left    triple orthodesis   FRACTURE SURGERY     left foot surgery     removed tendon and placed pins in foot   LEFT HEART CATH AND CORONARY ANGIOGRAPHY N/A 09/26/2019   Procedure: LEFT HEART CATH AND CORONARY ANGIOGRAPHY;  Surgeon: Anner Alm ORN, MD;  Location: [facility] INVASIVE CV LAB;  Service: Cardiovascular;  Laterality: N/A;   PLANTAR FASCIA SURGERY     both feet   POLYPECTOMY  04/28/2019   Procedure: POLYPECTOMY;  Surgeon: Kiang Norleen LOISE, MD;  Location: [facility];  Service: Endoscopy;;   POLYPECTOMY  01/01/2023   Procedure: POLYPECTOMY;  Surgeon: Kiang Norleen LOISE, MD;  Location: [facility];  Service: Gastroenterology;;   RIGHT HEART CATH N/A 02/15/2021   Procedure: RIGHT HEART CATH;  Surgeon: Elmira Newman PARAS, MD;  Location: [facility] INVASIVE CV LAB;  Service: Cardiovascular;  Laterality: N/A;   RIGHT/LEFT HEART CATH AND CORONARY ANGIOGRAPHY N/A 11/08/2021   Procedure: RIGHT/LEFT HEART CATH AND CORONARY ANGIOGRAPHY;  Surgeon: Ladona Heinz, MD;  Location: [facility] INVASIVE CV LAB;  Service: Cardiovascular;  Laterality: N/A;   STERNAL WIRES REMOVAL N/A 11/29/2022   Procedure: STERNAL WIRES REMOVAL;  Surgeon: Kerrin Elspeth BROCKS, MD;  Location: [facility] OR;  Service: Thoracic;  Laterality: N/A;   TEE WITHOUT CARDIOVERSION N/A 11/24/2021   Procedure: TRANSESOPHAGEAL ECHOCARDIOGRAM (TEE);  Surgeon: Obadiah Coy, MD;  Location: [facility] OR;  Service: Open Heart Surgery;  Laterality: N/A;   TONSILLECTOMY       Social History  reports that she has never smoked. She has never used smokeless tobacco. She reports that she does not drink alcohol and does not use drugs.  Allergies  Allergen Reactions   Adhesive [Tape]     Latex, band aids  causes whelts and blisters   Dermabond causes, welts   Psyllium Other (See Comments)    Severe constipation   Spironolactone  Other (See Comments)    Decreased urine output   Trulicity [Dulaglutide]     KIDNEY INFECTION,INABILITY TO HAVE BOWEL MOVEMENTS,BLOOD IN URINE   Hydrocodone -Acetaminophen  Itching    Tolerates small/ infrequent doses   Latex Other (See Comments)    Band-aids causes whelts and blisters   Oxycontin [Oxycodone Hcl] Other (See Comments)    FELT LIKE HEAD WAS GOING TO EXPLODE in head   Ciprofloxacin Rash    Yeast infection and a severe rash    Codeine Itching and Rash  Other Palpitations    Steroids      makes me feel like I'm having a heart attack. -    Family History  Problem Relation Age of Onset   Heart disease Mother 63   Diabetes Mother    Hypertension Mother    Hypertension Father    Colon polyps Brother    Stroke Maternal Grandmother    Heart disease Paternal Grandmother    Throat cancer Paternal Grandfather    Colon cancer Neg Hx    Esophageal cancer Neg Hx    Rectal cancer Neg Hx    Stomach cancer Neg Hx   Reviewed on admission  Prior to Admission medications   Medication Sig Start Date End Date Taking? Authorizing Provider  acetaminophen  (TYLENOL ) 500 MG tablet Take 1,000 mg by mouth every 6 (six) hours as needed for moderate pain.    [provider]  albuterol  (VENTOLIN  HFA) 108 (90 Base) MCG/ACT inhaler Inhale 2 puffs into the lungs every 4 (four) hours as needed for wheezing or shortness of breath.    [provider]  alendronate (FOSAMAX) 10 MG tablet Take 10 mg by mouth once a week. Take with a full glass of water on an empty stomach.    [provider]  ergocalciferol  (VITAMIN D2) 1.25 MG (50000 UT) capsule Take 50,000 Units by mouth every Tuesday.    [provider]  lactulose  (CHRONULAC ) 10 GM/15ML solution Take 15-30 ml up to twice daily for constipation 04/17/23   Collier, Amanda R, PA-C  levothyroxine  (SYNTHROID ) 150 MCG tablet Take 150 mcg by mouth daily before breakfast.    [provider]  lidocaine  (LIDODERM ) 5 % Place 1 patch onto the skin daily as needed (Knee pain). Remove & Discard patch within 12 hours or as directed by MD    [provider]  linaclotide  (LINZESS ) 290 MCG CAPS capsule Take 1 capsule (290 mcg total) by mouth daily before breakfast. 01/03/23   Abran Norleen SAILOR, MD  metoprolol  tartrate (LOPRESSOR ) 25 MG tablet Take 1 tablet (25 mg total) by mouth 3 (three) times daily as needed (Palpitations). 02/06/23 11/11/24  Ladona Heinz, MD  midodrine  (PROAMATINE ) 5 MG tablet Take 1 tablet (5 mg total) by mouth 3 (three) times daily with meals. 10/10/23   Ladona Heinz, MD  nitroGLYCERIN  (NITROSTAT ) 0.4 MG SL tablet Place 1 tablet (0.4 mg total) under the tongue every 5 (five) minutes as needed for chest pain. 02/06/23   Ladona Heinz, MD  omeprazole  (PRILOSEC) 40 MG capsule Take 1 capsule (40 mg total) by mouth daily. 12/19/23   Kennedy-Smith, Colleen M, NP  polyethylene glycol (MIRALAX  / GLYCOLAX ) 17 g packet Take 17 g by mouth daily.    [provider]  potassium chloride  SA (KLOR-CON  M) 20 MEQ tablet Take 40 mg twice daily for one week. After one week, decrease to 20 mg three times a day. 01/25/24   Ladona Heinz, MD  rosuvastatin  (CRESTOR ) 5 MG tablet Take 1 tablet (5 mg total) by mouth daily. 09/28/23   Ladona Heinz, MD  torsemide  40 MG TABS Take 40 mg by mouth in the morning and at bedtime. 1 tab in Am and one tab in afternoon 02/07/24   Ladona Heinz, MD  traMADol  (ULTRAM ) 50 MG tablet Take 1 tablet (50 mg total) by mouth every 6 (six) hours as needed for moderate pain. 11/29/22   Kerrin Elspeth BROCKS, MD  traZODone (DESYREL) 50 MG tablet  Take 50 mg by mouth at bedtime. 07/06/23  [provider]    Physical Exam: Vitals:   04/15/24 1232 04/15/24 1240 04/15/24 1241 04/15/24 1415  BP: (!) 131/59   125/72  Pulse: 85   (!) 105  Resp: 16   20  Temp:  98.4 F (36.9 C)    TempSrc:  Oral    SpO2: 100%   100%  Weight:   118.8 kg   Height:   5' 9 (1.753 m)     Physical Exam Constitutional:      General: She is not in acute distress.    Appearance: Normal appearance. She is obese.  HENT:     Head: Normocephalic and atraumatic.     Mouth/Throat:     Mouth: Mucous membranes are moist.     Pharynx: Oropharynx is clear.  Eyes:     Extraocular Movements: Extraocular movements intact.     Pupils: Pupils are equal, round, and reactive to light.  Cardiovascular:     Rate and Rhythm: Normal rate and regular rhythm.     Pulses: Normal pulses.     Heart sounds: Normal heart sounds.  Pulmonary:     Effort: Pulmonary effort is normal. No respiratory distress.     Breath sounds: Normal breath sounds.  Abdominal:     General: Bowel sounds are normal. There is no distension.     Palpations: Abdomen is soft.     Tenderness: There is abdominal tenderness.  Musculoskeletal:        General: No swelling or deformity.  Skin:    General: Skin is warm and dry.  Neurological:     General: No focal deficit present.     Mental Status: Mental status is at baseline. She is disoriented.    Labs on Admission: I have personally reviewed following labs and imaging studies  CBC: Recent Labs  Lab 04/15/24 1236  WBC 5.9  NEUTROABS 4.6  HGB 12.8  HCT 37.9  MCV 92.0  PLT 54*    Basic Metabolic Panel: Recent Labs  Lab 04/15/24 1236  NA 140  K 3.4*  CL 106  CO2 22  GLUCOSE 139*  BUN 13  CREATININE 1.33*  CALCIUM  9.7    GFR: Estimated Creatinine Clearance: 55 mL/min (A) (by C-G formula based on SCr of 1.33 mg/dL (H)).  Liver Function Tests: Recent Labs  Lab 04/15/24 1236  AST 51*  ALT 20  ALKPHOS 159*   BILITOT 2.5*  PROT 5.8*  ALBUMIN  3.2*    Urine analysis:    Component Value Date/Time   COLORURINE AMBER (A) 11/12/2023 1700   APPEARANCEUR TURBID (A) 11/12/2023 1700   LABSPEC 1.025 11/12/2023 1700   PHURINE 5.0 11/12/2023 1700   GLUCOSEU NEGATIVE 11/12/2023 1700   HGBUR MODERATE (A) 11/12/2023 1700   BILIRUBINUR NEGATIVE 11/12/2023 1700   KETONESUR NEGATIVE 11/12/2023 1700   PROTEINUR NEGATIVE 11/12/2023 1700   NITRITE NEGATIVE 11/12/2023 1700   LEUKOCYTESUR NEGATIVE 11/12/2023 1700    Radiological Exams on Admission: DG Chest Port 1 View Result Date: 04/15/2024 EXAM: 1 VIEW(S) XRAY OF THE CHEST 04/15/2024 01:02:00 PM COMPARISON: 11/12/2023 CLINICAL HISTORY: AMS (altered mental status) FINDINGS: LUNGS AND PLEURA: Chronic elevation of the left hemidiaphragm with left basilar opacity, favor to reflect atelectasis. No overt pulmonary edema. No sizeable pleural effusion. No pneumothorax. HEART AND MEDIASTINUM: Mild cardiomegaly. Prosthetic aortic valve. Median sternotomy. BONES AND SOFT TISSUES: No acute osseous abnormality. IMPRESSION: 1. Chronic elevation of the left hemidiaphragm with left basilar opacity, favored to reflect atelectasis. 2. Mild cardiomegaly. Electronically signed  by: Harrietta Sherry MD 04/15/2024 01:30 PM EST RP Workstation: [id number]   CT Head Wo Contrast Result Date: 04/15/2024 EXAM: CT HEAD WITHOUT CONTRAST 04/15/2024 12:43:00 PM TECHNIQUE: CT of the head was performed without the administration of intravenous contrast. Automated exposure control, iterative reconstruction, and/or weight based adjustment of the mA/kV was utilized to reduce the radiation dose to as low as reasonably achievable. COMPARISON: None available. CLINICAL HISTORY: Altered mental status. FINDINGS: BRAIN AND VENTRICLES: There is no evidence of an acute infarct, intracranial hemorrhage, mass, midline shift, hydrocephalus, or extra-axial fluid collection. Cerebral volume is normal. Cerebral  white matter hypodensities are nonspecific but compatible with mild chronic small vessel ischemic disease. Calcified atherosclerosis at the skull base. ORBITS: Bilateral cataract extraction. SINUSES: No acute abnormality. SOFT TISSUES AND SKULL: No acute soft tissue abnormality. No skull fracture. IMPRESSION: 1. No acute intracranial abnormality. 2. Mild chronic small vessel ischemic disease. Electronically signed by: Dasie Hamburg MD 04/15/2024 01:11 PM EST RP Workstation: [id number]   EKG: Independently reviewed.  Sinus rhythm 83 beats minute.  Nonspecific intraventricular conduction delay.  Nonspecific T wave changes.  QTc prolonged at 516.  Assessment/Plan Principal Problem:   Acute encephalopathy Active Problems:   Primary hypothyroidism   Type 2 diabetes mellitus without complications (HCC)   CKD (chronic kidney disease) stage 3, GFR 30-59 ml/min (HCC)   S/P aortic valve replacement with bioprosthetic valve   Iron deficiency anemia   Gastroesophageal reflux disease without esophagitis   CHF (congestive heart failure) (HCC)   Other cirrhosis of liver (HCC)   Paroxysmal atrial fibrillation (HCC)   Portal hypertension (HCC)   Fibromyalgia   Acute cephalopathy, presumed hepatic encephalopathy Elevated lactic acid in the setting of decreased p.o. intake, liver disease, mild creatinine elevation  > Patient presented with acute encephalopathy.  Decreased p.o. intake for several days and significantly altered today was repeating things for EMS and not following commands. > CT head showed no acute abnormality.  No leukocytosis.  Electrolytes normal or near normal.  LFTs largely stable mildly elevated T. bili 2.5.  TSH 11.7 with T4 and T3 pending. > Ammonia significantly elevated to 81 in the setting of known cirrhosis raising suspicion for hepatic encephalopathy.  Is prescribed lactulose  outpatient. > Lactic acid came back elevated at 4.3.  Will repeat to confirm this is not a lab error and no  obvious etiology for this.  Has been covered for UTI as she has a history of this with ceftriaxone in the ED as well as receiving a 250 cc bolus in the setting of known CHF. - Monitoring on progressive unit overnight - Continue with lactulose  - Hold off on further ceftriaxone pending further workup - Repeat lactic acid to confirm truly elevated and trend (no recent prior to compare for elevated baseline in the setting of cirrhosis) - Now Abdominal pain on my exam, will get CT abdomen pelvis - Follow-up urinalysis, urine cultures if indicated - Trend fever curve and WBC - Trend lactic acid  Cirrhosis > Known history of liver disease/cirrhosis and is being worked up by [facility] outpatient for possible transplant. > Unclear if elevated lactic acid level above is related to this baseline liver disease as there is not been a lactic acid checked in 4 to 5 years. > Concern for hepatic encephalopathy as above, started on lactulose  as above. - Trend LFTs, PT, INR - Holding diuretic in the setting of elevated creatinine and decreased p.o. intake - Supportive care  Atrial fibrillation - Not on anticoagulation -  Will need to confirm if on metoprolol   CKD 3 Elevated creatinine Hypokalemia > Creatinine elevated to 1.33 from baseline 1.1, not quite true AKI.  This is in the setting of decreased p.o. intake. > Potassium 3.4, will hold off on supplementation as we are holding torsemide . - Gentle fluids as above - Trend renal function and electrolytes  Prolonged QTc - AM EKG - Avoid QTc prolonging medications.  DM - SSI  Hypothyroidism > TSH 1.7 in the ED.  T4 and T3 pending. - Continue home Synthroid   GERD - Continue home PPI  Status post aortic valve replacement - Noted  Diastolic CHF > Last echo in January with EF 55%, G1 DD, normal RV function. - Holding torsemide  as above - Continue to monitor  Anemia - Hemoglobin stable in the ED  Orthostatic hypotension - Continue  midodrine   DVT prophylaxis: SCDs given thrombocytopenia Code Status:   Full Family Communication:  Attempted to reach husband by phone but there was no answer.  Disposition Plan:   Patient is from:  Home  Anticipated DC to:  Home  Anticipated DC date:  1 to 4 days  Anticipated DC barriers: None  Consults called:  None Admission status:  Observation, progressive  Severity of Illness: The appropriate patient status for this patient is OBSERVATION. Observation status is judged to be reasonable and necessary in order to provide the required intensity of service to ensure the patient's safety. The patient's presenting symptoms, physical exam findings, and initial radiographic and laboratory data in the context of their medical condition is felt to place them at decreased risk for further clinical deterioration. Furthermore, it is anticipated that the patient will be medically stable for discharge from the hospital within 2 midnights of admission.    Marsa KATHEE Scurry MD [facility]  How to contact the [facility] Attending or Consulting provider 7A - 7P or covering provider during after hours 7P -7A, for this patient?   Check the care team in [facility] and look for a) attending/consulting [facility] provider listed and b) the [facility] team listed Log into [email or web address] and use [place]'s universal password to access. If you do not have the password, please contact the hospital operator. Locate the [facility] provider you are looking for under [facility] and page to a number that you can be directly reached. If you still have difficulty reaching the provider, please page the [facility] (Director on Call) for the Hospitalists listed on amion for assistance.  04/15/2024, 3:01 PM

## 2024-04-15 NOTE — Progress Notes (Signed)
 [facility]   Bed Availability Yes  Level of Care Needed:  Yes  MD Agree to transfer: Yes  Patient agree to transfer: Yes  Sharolyn Batman, RN [facility]

## 2024-04-16 DIAGNOSIS — G9340 Encephalopathy, unspecified: Secondary | ICD-10-CM | POA: Diagnosis not present

## 2024-04-16 LAB — COMPREHENSIVE METABOLIC PANEL WITH GFR
ALT: 17 U/L (ref 0–44)
AST: 47 U/L — ABNORMAL HIGH (ref 15–41)
Albumin: 3.1 g/dL — ABNORMAL LOW (ref 3.5–5.0)
Alkaline Phosphatase: 128 U/L — ABNORMAL HIGH (ref 38–126)
Anion gap: 9 (ref 5–15)
BUN: 14 mg/dL (ref 8–23)
CO2: 25 mmol/L (ref 22–32)
Calcium: 9.6 mg/dL (ref 8.9–10.3)
Chloride: 110 mmol/L (ref 98–111)
Creatinine, Ser: 1.06 mg/dL — ABNORMAL HIGH (ref 0.44–1.00)
GFR, Estimated: 57 mL/min — ABNORMAL LOW (ref >60)
Glucose, Bld: 109 mg/dL — ABNORMAL HIGH (ref 70–99)
Potassium: 3.2 mmol/L — ABNORMAL LOW (ref 3.5–5.1)
Sodium: 144 mmol/L (ref 135–145)
Total Bilirubin: 2 mg/dL — ABNORMAL HIGH (ref 0.0–1.2)
Total Protein: 4.8 g/dL — ABNORMAL LOW (ref 6.5–8.1)

## 2024-04-16 LAB — GLUCOSE, CAPILLARY
Glucose-Capillary: 92 mg/dL (ref 70–99)
Glucose-Capillary: 117 mg/dL — ABNORMAL HIGH (ref 70–99)
Glucose-Capillary: 104 mg/dL — ABNORMAL HIGH (ref 70–99)
Glucose-Capillary: 84 mg/dL (ref 70–99)

## 2024-04-16 LAB — CBC
HCT: 31.2 % — ABNORMAL LOW (ref 36.0–46.0)
Hemoglobin: 10.5 g/dL — ABNORMAL LOW (ref 12.0–15.0)
MCH: 31.1 pg (ref 26.0–34.0)
MCHC: 33.7 g/dL (ref 30.0–36.0)
MCV: 92.3 fL (ref 80.0–100.0)
Platelets: 41 K/uL — ABNORMAL LOW (ref 150–400)
RBC: 3.38 MIL/uL — ABNORMAL LOW (ref 3.87–5.11)
RDW: 14.6 % (ref 11.5–15.5)
WBC: 4.7 K/uL (ref 4.0–10.5)
nRBC: 0 % (ref 0.0–0.2)

## 2024-04-16 LAB — PROTIME-INR
INR: 1.5 — ABNORMAL HIGH (ref 0.8–1.2)
Prothrombin Time: 18.4 s — ABNORMAL HIGH (ref 11.4–15.2)

## 2024-04-16 MED ORDER — LINACLOTIDE 145 MCG PO CAPS
290.0000 ug | ORAL_CAPSULE | Freq: Every day | ORAL | Status: DC
  Administered 2024-04-17 – 2024-04-19 (×3): 290 ug via ORAL
  Filled 2024-04-16 (×3): qty 2

## 2024-04-16 MED ORDER — SPIRONOLACTONE 12.5 MG HALF TABLET
12.5000 mg | ORAL_TABLET | Freq: Once | ORAL | Status: AC
  Administered 2024-04-16: 12.5 mg via ORAL
  Filled 2024-04-16: qty 1

## 2024-04-16 MED ORDER — TORSEMIDE 20 MG PO TABS
40.0000 mg | ORAL_TABLET | Freq: Every day | ORAL | Status: DC
  Administered 2024-04-16 – 2024-04-18 (×3): 40 mg via ORAL
  Filled 2024-04-16 (×3): qty 2

## 2024-04-16 MED ORDER — ONDANSETRON HCL 4 MG/2ML IJ SOLN
4.0000 mg | Freq: Once | INTRAMUSCULAR | Status: AC
  Administered 2024-04-16: 4 mg via INTRAVENOUS
  Filled 2024-04-16: qty 2

## 2024-04-16 MED ORDER — NITROFURANTOIN MACROCRYSTAL 50 MG PO CAPS
100.0000 mg | ORAL_CAPSULE | Freq: Every day | ORAL | Status: DC
  Administered 2024-04-16 – 2024-04-19 (×4): 100 mg via ORAL
  Filled 2024-04-16 (×4): qty 2
  Filled 2024-04-16: qty 1

## 2024-04-16 MED ORDER — POTASSIUM CHLORIDE CRYS ER 20 MEQ PO TBCR
20.0000 meq | EXTENDED_RELEASE_TABLET | Freq: Every day | ORAL | Status: DC
  Administered 2024-04-16 – 2024-04-19 (×4): 20 meq via ORAL
  Filled 2024-04-16 (×4): qty 1

## 2024-04-16 MED ORDER — ALUM & MAG HYDROXIDE-SIMETH 200-200-20 MG/5ML PO SUSP
30.0000 mL | ORAL | Status: DC | PRN
  Administered 2024-04-16: 30 mL via ORAL
  Filled 2024-04-16: qty 30

## 2024-04-16 NOTE — Plan of Care (Signed)

## 2024-04-16 NOTE — Progress Notes (Signed)
 PROGRESS NOTE    Joanna Alvarado  FMW:[id number] DOB: 1956-01-06 DOA: 04/15/2024 PCP: Ina Marcellus RAMAN, MD   Brief Narrative:  Joanna Alvarado is a 68 y.o. female with medical history significant of atrial fibrillation, diabetes, CKD 3, hypothyroidism, GERD, status post aortic valve replacement, diastolic CHF, cirrhosis, anemia, orthostatic hypotension, fibromyalgia presenting with altered mental status.    Assessment & Plan:   Principal Problem:   Acute encephalopathy Active Problems:   Primary hypothyroidism   Type 2 diabetes mellitus without complications (HCC)   CKD (chronic kidney disease) stage 3, GFR 30-59 ml/min (HCC)   S/P aortic valve replacement with bioprosthetic valve   Iron deficiency anemia   Gastroesophageal reflux disease without esophagitis   CHF (congestive heart failure) (HCC)   Other cirrhosis of liver (HCC)   Paroxysmal atrial fibrillation (HCC)   Portal hypertension (HCC)   Fibromyalgia  Acute encephalopathy, presumed hepatic encephalopathy in the setting of hyperammonemia, improving but not resolved Cirrhosis of unknown etiology - Known history of liver disease/cirrhosis and is being worked up by [facility] outpatient for possible transplant- Noted MASH vs other etiology - Unclear if elevated lactic acid level above is related to this baseline liver disease as there is not been a lactic acid checked in 4 to 5 years per chart review - Mental status improving but not yet back to baseline still sluggish to answer even simple questions which is not her normal per husband at bedside - Continue to increase lactulose , titrate for 3 loose bowel movements per day. - Trend LFTs, PT, INR - Resume home diuretics including torsemide , trial spironolactone  x 1 (previously reported intolerance but not true allergy)  Infection ruled out Patient meets SIRS criteria only Lactic acidosis  - All likely multifactorial given above hepatic encephalopathy with poor p.o. intake,  volume depletion with ongoing diuretics - Lactic acidosis likely secondary to profound dehydration and hypovolemia, downtrending appropriately, no longer following - Hold off on further ceftriaxone pending further workup -no signs of infection at this time - Patient remains without leukocytosis, remains afebrile, CT abdomen pelvis with no clear acute abnormalities -imaging consistent with portal hypertension and abdominal varices  Paroxysmal atrial fibrillation - Not on anticoagulation -questionably due to low A-fib burden in the setting of liver disease and elevated risk of bleeding - Med rec notes metoprolol , may transition to propranolol prior to discharge pending patient's clinical improvement -heart rate remains well-controlled, unclear if patient will be able to tolerate beta-blocker  Diastolic CHF - Without acute exacerbation - Torsemide  resumed as above along with spironolactone  and potassium - Last echo in January with EF 55%, G1 DD, normal RV function.  CKD 3 Elevated creatinine, AKI ruled out Hypokalemia -Creatinine baseline around 1, peak 1.3 - Potassium remains borderline low, continue home potassium supplementation, spironolactone  as well as torsemide  as above, follow clinically, repeat morning labs   Prolonged QTc, chronic - QTc 516 overnight -Patient noted to wear 2-week Holter monitor in July of this year for palpitations with multiple episodes of SVT/PACs/PVCs; EKG at that time with QTc of 500; prior EKG in low 500 range   DM, diet controlled, well-controlled Previous A1c 4.9, patient states that her most recent outpatient A1c was around 6, no indication to repeat testing at this time - Continue diet  Hypothyroidism -TSH elevated at 11.7, free T4 within normal limits 0.9 -likely euthyroid - Would recommend repeat testing once acute illness has resolved, in the interim continue home dose of levothyroxine    GERD - Continue home  PPI Status post aortic valve replacement  - Noted  Chronic anemia, likely anemia of chronic disease secondary to above - Hemoglobin stable continue to follow repeat labs, no signs or symptoms of bleeding   Orthostatic hypotension - Continue midodrine  - Unclear why patient is on beta-blocker and midodrine , will likely discontinue beta-blocker as above  DVT prophylaxis: SCDs Start: 04/15/24 1445 Code Status:   Code Status: Full Code Family Communication: Husband updated  Status is: Inpt  Dispo: The patient is from: Home              Anticipated d/c is to: Home              Anticipated d/c date is: 24-48h              Patient currently is not medically stable for discharge  Consultants:  None  Procedures:  None  Antimicrobials:  None   Subjective: No acute issues/events overnight - mental status improving but not yet back to baseline per husband  Objective: Vitals:   04/15/24 1918 04/15/24 2239 04/16/24 0318 04/16/24 0648  BP: 132/74 126/71 124/67 125/70  Pulse: (!) 104 87 83 82  Resp: 19 16 16 19   Temp: 98.2 F (36.8 C) 98 F (36.7 C) 97.8 F (36.6 C) 98 F (36.7 C)  TempSrc: Oral Oral Oral Oral  SpO2: 100% 100% 99% 98%  Weight:      Height:        Intake/Output Summary (Last 24 hours) at 04/16/2024 1357 Last data filed at 04/16/2024 0400 Gross per 24 hour  Intake 250 ml  Output 600 ml  Net -350 ml   Filed Weights   04/15/24 1241  Weight: 118.8 kg    Examination:  General:  Pleasantly resting in bed, No acute distress. HEENT:  Normocephalic atraumatic.  Sclerae nonicteric, noninjected.  Extraocular movements intact bilaterally. Neck:  Without mass or deformity.  Trachea is midline. Lungs:  Clear to auscultate bilaterally without rhonchi, wheeze, or rales. Heart:  Regular rate and rhythm.  Without murmurs, rubs, or gallops. Abdomen:  Soft, minimally distended and tender diffusely without PMI rebound or guarding. Extremities: Without cyanosis, clubbing, edema, or obvious deformity. Skin:   Warm and dry, no erythema.  Data Reviewed: I have personally reviewed following labs and imaging studies  CBC: Recent Labs  Lab 04/15/24 1236 04/16/24 0421  WBC 5.9 4.7  NEUTROABS 4.6  --   HGB 12.8 10.5*  HCT 37.9 31.2*  MCV 92.0 92.3  PLT 54* 41*   Basic Metabolic Panel: Recent Labs  Lab 04/15/24 1236 04/16/24 0421  NA 140 144  K 3.4* 3.2*  CL 106 110  CO2 22 25  GLUCOSE 139* 109*  BUN 13 14  CREATININE 1.33* 1.06*  CALCIUM  9.7 9.6   GFR: Estimated Creatinine Clearance: 69 mL/min (A) (by C-G formula based on SCr of 1.06 mg/dL (H)). Liver Function Tests: Recent Labs  Lab 04/15/24 1236 04/16/24 0421  AST 51* 47*  ALT 20 17  ALKPHOS 159* 128*  BILITOT 2.5* 2.0*  PROT 5.8* 4.8*  ALBUMIN  3.2* 3.1*   No results for input(s): LIPASE, AMYLASE in the last 168 hours. Recent Labs  Lab 04/15/24 1236  AMMONIA 81*   Coagulation Profile: Recent Labs  Lab 04/15/24 1702 04/16/24 0421  INR 1.4* 1.5*   CBG: Recent Labs  Lab 04/15/24 1738 04/15/24 1748 04/15/24 2237 04/16/24 0817 04/16/24 1200  GLUCAP 96 95 87 92 117*   Thyroid  Function Tests: Recent Labs  04/15/24 1236 04/15/24 1702  TSH 11.704*  --   FREET4  --  0.93   Sepsis Labs: Recent Labs  Lab 04/15/24 1443 04/15/24 1710 04/15/24 1820  LATICACIDVEN 4.8* 2.8* 2.3*    Recent Results (from the past 240 hours)  Blood culture (routine x 2)     Status: None (Preliminary result)   Collection Time: 04/15/24  4:40 PM   Specimen: BLOOD RIGHT ARM  Result Value Ref Range Status   Specimen Description BLOOD RIGHT ARM  Final   Special Requests   Final    BOTTLES DRAWN AEROBIC AND ANAEROBIC Blood Culture results may not be optimal due to an inadequate volume of blood received in culture bottles   Culture   Final    NO GROWTH < 24 HOURS Performed at [facility], [address], [place], [place] [address]    Report Status PENDING  Incomplete  Blood culture (routine x 2)     Status:  None (Preliminary result)   Collection Time: 04/15/24  5:02 PM   Specimen: BLOOD  Result Value Ref Range Status   Specimen Description BLOOD SITE NOT SPECIFIED  Final   Special Requests   Final    BOTTLES DRAWN AEROBIC AND ANAEROBIC Blood Culture adequate volume   Culture   Final    NO GROWTH < 24 HOURS Performed at [facility], [address] [place], [place] [address]    Report Status PENDING  Incomplete         Radiology Studies: CT ABDOMEN PELVIS W CONTRAST Result Date: 04/15/2024 CLINICAL DATA:  Abdomen pain EXAM: CT ABDOMEN AND PELVIS WITH CONTRAST TECHNIQUE: Multidetector CT imaging of the abdomen and pelvis was performed using the standard protocol following bolus administration of intravenous contrast. RADIATION DOSE REDUCTION: This exam was performed according to the departmental dose-optimization program which includes automated exposure control, adjustment of the mA and/or kV according to patient size and/or use of iterative reconstruction technique. CONTRAST:  75mL OMNIPAQUE  IOHEXOL  350 MG/ML SOLN COMPARISON:  CT 04/08/2023, 04/13/2021 FINDINGS: Lower chest: Lung bases demonstrate small right and trace left pleural effusions. Partial atelectasis in the right greater than left lower lobes. Cardiomegaly with aortic valve prosthesis and mild coronary vascular calcification. Lower sternotomy Hepatobiliary: Cirrhotic morphology of the liver. Chronic calcifications at the periphery of the right hepatic lobe. Cholecystectomy. No biliary dilatation Pancreas: Atrophic.  No inflammation Spleen: Upper normal in size measuring 12.5 cm on coronal views. Adrenals/Urinary Tract: Adrenal glands are normal. Kidneys show no hydronephrosis. Scarring left kidney with a cyst, no imaging follow-up is recommended. Bladder is unremarkable Stomach/Bowel: The stomach is within normal limits. No dilated small bowel. No acute bowel wall thickening. Few colon diverticula. Vascular/Lymphatic: Moderate  aortic atherosclerosis. No aneurysm. No suspicious lymph nodes. Large left abdominal varices are again noted. Reproductive: Hysterectomy.  No adnexal mass Other: No free air. Small volume free fluid in the pelvis. Mild generalized subcutaneous edema Musculoskeletal: No acute osseous abnormality. IMPRESSION: 1. No CT evidence for acute intra-abdominal or pelvic abnormality. 2. Cirrhotic morphology of the liver findings suspicious for portal hypertension including large left abdominal varices and small volume free fluid in the pelvis. 3. Small right and trace left pleural effusions with partial atelectasis in the right greater than left lower lobes. 4. Aortic atherosclerosis. Aortic Atherosclerosis (ICD10-I70.0). Electronically Signed   By: Luke Bun M.D.   On: 04/15/2024 15:53   DG Chest Port 1 View Result Date: 04/15/2024 EXAM: 1 VIEW(S) XRAY OF THE CHEST  04/15/2024 01:02:00 PM COMPARISON: 11/12/2023 CLINICAL HISTORY: AMS (altered mental status) FINDINGS: LUNGS AND PLEURA: Chronic elevation of the left hemidiaphragm with left basilar opacity, favor to reflect atelectasis. No overt pulmonary edema. No sizeable pleural effusion. No pneumothorax. HEART AND MEDIASTINUM: Mild cardiomegaly. Prosthetic aortic valve. Median sternotomy. BONES AND SOFT TISSUES: No acute osseous abnormality. IMPRESSION: 1. Chronic elevation of the left hemidiaphragm with left basilar opacity, favored to reflect atelectasis. 2. Mild cardiomegaly. Electronically signed by: Shahmeer Lateef MD 04/15/2024 01:30 PM EST RP Workstation: [id number]   CT Head Wo Contrast Result Date: 04/15/2024 EXAM: CT HEAD WITHOUT CONTRAST 04/15/2024 12:43:00 PM TECHNIQUE: CT of the head was performed without the administration of intravenous contrast. Automated exposure control, iterative reconstruction, and/or weight based adjustment of the mA/kV was utilized to reduce the radiation dose to as low as reasonably achievable. COMPARISON: None available.  CLINICAL HISTORY: Altered mental status. FINDINGS: BRAIN AND VENTRICLES: There is no evidence of an acute infarct, intracranial hemorrhage, mass, midline shift, hydrocephalus, or extra-axial fluid collection. Cerebral volume is normal. Cerebral white matter hypodensities are nonspecific but compatible with mild chronic small vessel ischemic disease. Calcified atherosclerosis at the skull base. ORBITS: Bilateral cataract extraction. SINUSES: No acute abnormality. SOFT TISSUES AND SKULL: No acute soft tissue abnormality. No skull fracture. IMPRESSION: 1. No acute intracranial abnormality. 2. Mild chronic small vessel ischemic disease. Electronically signed by: Dasie Hamburg MD 04/15/2024 01:11 PM EST RP Workstation: [id number]        Scheduled Meds:  insulin  aspart  0-15 Units Subcutaneous TID WC   lactulose   20 g Oral TID   levothyroxine   137 mcg Oral Q0600   [START ON 04/17/2024] linaclotide   290 mcg Oral QAC breakfast   midodrine   5 mg Oral TID WC   nitrofurantoin  100 mg Oral Daily   pantoprazole   40 mg Oral Daily   potassium chloride  SA  20 mEq Oral Daily   rosuvastatin   5 mg Oral Daily   torsemide   40 mg Oral Q2000   Continuous Infusions:   LOS: 0 days   Time spent:  Elsie JAYSON Montclair, DO [facility]  If 7PM-7AM, please contact night-coverage [email or web address]  04/16/2024, 1:57 PM

## 2024-04-16 NOTE — Care Management Obs Status (Signed)
 MEDICARE OBSERVATION STATUS NOTIFICATION   Patient Details  Name: Joanna Alvarado MRN: [id number] Date of Birth: 10/25/1955   Medicare Observation Status Notification Given:  Yes    Joanna CHRISTELLA Eva, LCSW 04/16/2024, 4:24 PM

## 2024-04-17 ENCOUNTER — Encounter (HOSPITAL_COMMUNITY): Payer: Self-pay | Admitting: Internal Medicine

## 2024-04-17 DIAGNOSIS — Z8249 Family history of ischemic heart disease and other diseases of the circulatory system: Secondary | ICD-10-CM | POA: Diagnosis not present

## 2024-04-17 DIAGNOSIS — E039 Hypothyroidism, unspecified: Secondary | ICD-10-CM | POA: Diagnosis not present

## 2024-04-17 DIAGNOSIS — D696 Thrombocytopenia, unspecified: Secondary | ICD-10-CM | POA: Diagnosis not present

## 2024-04-17 DIAGNOSIS — N1831 Chronic kidney disease, stage 3a: Secondary | ICD-10-CM | POA: Diagnosis not present

## 2024-04-17 DIAGNOSIS — E872 Acidosis, unspecified: Secondary | ICD-10-CM | POA: Diagnosis not present

## 2024-04-17 DIAGNOSIS — E1122 Type 2 diabetes mellitus with diabetic chronic kidney disease: Secondary | ICD-10-CM | POA: Diagnosis not present

## 2024-04-17 DIAGNOSIS — D631 Anemia in chronic kidney disease: Secondary | ICD-10-CM | POA: Diagnosis not present

## 2024-04-17 DIAGNOSIS — Z7983 Long term (current) use of bisphosphonates: Secondary | ICD-10-CM | POA: Diagnosis not present

## 2024-04-17 DIAGNOSIS — M797 Fibromyalgia: Secondary | ICD-10-CM | POA: Diagnosis not present

## 2024-04-17 DIAGNOSIS — E876 Hypokalemia: Secondary | ICD-10-CM | POA: Diagnosis not present

## 2024-04-17 DIAGNOSIS — K7469 Other cirrhosis of liver: Secondary | ICD-10-CM | POA: Diagnosis not present

## 2024-04-17 DIAGNOSIS — G9340 Encephalopathy, unspecified: Secondary | ICD-10-CM | POA: Diagnosis not present

## 2024-04-17 DIAGNOSIS — I130 Hypertensive heart and chronic kidney disease with heart failure and stage 1 through stage 4 chronic kidney disease, or unspecified chronic kidney disease: Secondary | ICD-10-CM | POA: Diagnosis not present

## 2024-04-17 DIAGNOSIS — E7800 Pure hypercholesterolemia, unspecified: Secondary | ICD-10-CM | POA: Diagnosis not present

## 2024-04-17 DIAGNOSIS — R6510 Systemic inflammatory response syndrome (SIRS) of non-infectious origin without acute organ dysfunction: Secondary | ICD-10-CM | POA: Diagnosis not present

## 2024-04-17 DIAGNOSIS — Z953 Presence of xenogenic heart valve: Secondary | ICD-10-CM | POA: Diagnosis not present

## 2024-04-17 DIAGNOSIS — E860 Dehydration: Secondary | ICD-10-CM | POA: Diagnosis not present

## 2024-04-17 DIAGNOSIS — K7682 Hepatic encephalopathy: Secondary | ICD-10-CM | POA: Diagnosis not present

## 2024-04-17 DIAGNOSIS — I951 Orthostatic hypotension: Secondary | ICD-10-CM | POA: Diagnosis not present

## 2024-04-17 DIAGNOSIS — Z79890 Hormone replacement therapy (postmenopausal): Secondary | ICD-10-CM | POA: Diagnosis not present

## 2024-04-17 DIAGNOSIS — K766 Portal hypertension: Secondary | ICD-10-CM | POA: Diagnosis not present

## 2024-04-17 DIAGNOSIS — I480 Paroxysmal atrial fibrillation: Secondary | ICD-10-CM | POA: Diagnosis not present

## 2024-04-17 DIAGNOSIS — K219 Gastro-esophageal reflux disease without esophagitis: Secondary | ICD-10-CM | POA: Diagnosis not present

## 2024-04-17 DIAGNOSIS — I5032 Chronic diastolic (congestive) heart failure: Secondary | ICD-10-CM | POA: Diagnosis not present

## 2024-04-17 LAB — COMPREHENSIVE METABOLIC PANEL WITH GFR
ALT: 15 U/L (ref 0–44)
AST: 46 U/L — ABNORMAL HIGH (ref 15–41)
Albumin: 2.6 g/dL — ABNORMAL LOW (ref 3.5–5.0)
Alkaline Phosphatase: 106 U/L (ref 38–126)
Anion gap: 7 (ref 5–15)
BUN: 12 mg/dL (ref 8–23)
CO2: 26 mmol/L (ref 22–32)
Calcium: 9.1 mg/dL (ref 8.9–10.3)
Chloride: 110 mmol/L (ref 98–111)
Creatinine, Ser: 1.01 mg/dL — ABNORMAL HIGH (ref 0.44–1.00)
GFR, Estimated: 60 mL/min — ABNORMAL LOW (ref >60)
Glucose, Bld: 75 mg/dL (ref 70–99)
Potassium: 3.8 mmol/L (ref 3.5–5.1)
Sodium: 143 mmol/L (ref 135–145)
Total Bilirubin: 1.4 mg/dL — ABNORMAL HIGH (ref 0.0–1.2)
Total Protein: 4.2 g/dL — ABNORMAL LOW (ref 6.5–8.1)

## 2024-04-17 LAB — CBC
HCT: 28.8 % — ABNORMAL LOW (ref 36.0–46.0)
Hemoglobin: 9.5 g/dL — ABNORMAL LOW (ref 12.0–15.0)
MCH: 31.4 pg (ref 26.0–34.0)
MCHC: 33 g/dL (ref 30.0–36.0)
MCV: 95 fL (ref 80.0–100.0)
Platelets: 43 K/uL — ABNORMAL LOW (ref 150–400)
RBC: 3.03 MIL/uL — ABNORMAL LOW (ref 3.87–5.11)
RDW: 14.8 % (ref 11.5–15.5)
WBC: 3.7 K/uL — ABNORMAL LOW (ref 4.0–10.5)
nRBC: 0 % (ref 0.0–0.2)

## 2024-04-17 LAB — GLUCOSE, CAPILLARY
Glucose-Capillary: 84 mg/dL (ref 70–99)
Glucose-Capillary: 80 mg/dL (ref 70–99)
Glucose-Capillary: 94 mg/dL (ref 70–99)
Glucose-Capillary: 98 mg/dL (ref 70–99)

## 2024-04-17 LAB — T3, FREE: T3, Free: 2 pg/mL (ref 2.0–4.4)

## 2024-04-17 LAB — AMMONIA: Ammonia: 99 umol/L — ABNORMAL HIGH (ref 9–35)

## 2024-04-17 MED ORDER — LACTULOSE 10 GM/15ML PO SOLN
30.0000 g | Freq: Four times a day (QID) | ORAL | Status: DC
  Administered 2024-04-17 – 2024-04-19 (×7): 30 g via ORAL
  Filled 2024-04-17 (×8): qty 60

## 2024-04-17 MED ORDER — SPIRONOLACTONE 25 MG PO TABS
25.0000 mg | ORAL_TABLET | Freq: Every day | ORAL | Status: DC
  Administered 2024-04-17 – 2024-04-19 (×3): 25 mg via ORAL
  Filled 2024-04-17 (×3): qty 1

## 2024-04-17 MED ORDER — LACTULOSE 10 GM/15ML PO SOLN
30.0000 g | Freq: Three times a day (TID) | ORAL | Status: DC
  Administered 2024-04-17: 30 g via ORAL
  Filled 2024-04-17: qty 60

## 2024-04-17 NOTE — Progress Notes (Signed)
 Mobility Specialist - Progress Note   04/17/24 1207  Mobility  Activity Ambulated with assistance;Pivoted/transferred to/from [facility]  Level of Assistance Contact guard assist, steadying assist  Assistive Device BSC  Distance Ambulated (ft) 4 ft  Range of Motion/Exercises Active  Activity Response Tolerated well  Mobility visit 1 Mobility  Mobility Specialist Start Time (ACUTE ONLY) 1150  Mobility Specialist Stop Time (ACUTE ONLY) 1205  Mobility Specialist Time Calculation (min) (ACUTE ONLY) 15 min   Pt was found in bed and agreeable to mobilize. Assisted to [facility] and was left on recliner chair afterwards. No complaints. At EOS was left with call bell in reach and all needs met. Chair alarm on. RN notified.   Erminio Leos,  Mobility Specialist Can be reached via Secure Chat

## 2024-04-17 NOTE — Progress Notes (Signed)
 PROGRESS NOTE    Joanna Alvarado  FMW:[id number] DOB: 02/13/1956 DOA: 04/15/2024 PCP: Ina Marcellus RAMAN, MD   Brief Narrative:  Joanna Alvarado is a 68 y.o. female with medical history significant of atrial fibrillation, diabetes, CKD 3, hypothyroidism, GERD, status post aortic valve replacement, diastolic CHF, cirrhosis, anemia, orthostatic hypotension, fibromyalgia presenting with altered mental status.    Assessment & Plan:   Principal Problem:   Acute encephalopathy Active Problems:   Primary hypothyroidism   Type 2 diabetes mellitus without complications (HCC)   CKD (chronic kidney disease) stage 3, GFR 30-59 ml/min (HCC)   S/P aortic valve replacement with bioprosthetic valve   Iron deficiency anemia   Gastroesophageal reflux disease without esophagitis   CHF (congestive heart failure) (HCC)   Other cirrhosis of liver (HCC)   Paroxysmal atrial fibrillation (HCC)   Portal hypertension (HCC)   Fibromyalgia  Acute encephalopathy, presumed hepatic encephalopathy in the setting of hyperammonemia, improving but not resolved Cirrhosis of unknown etiology - Known history of liver disease/cirrhosis and is being worked up by [facility] outpatient for possible transplant- Noted MASH vs other etiology - Unclear if elevated lactic acid level above is related to this baseline liver disease as there is not been a lactic acid checked in 4 to 5 years per chart review - Mental status improving but not yet back to baseline - Ammonia continues to remain elevated (99) - increase lactulose  30mg  QID - consider rifaximin if no improvement - Trend LFTs, PT, INR - Resume home diuretics including torsemide , continue spironolactone  (Not true allergy - tolerated 04/16/2024)  Infection ruled out Patient meets SIRS criteria only Lactic acidosis  - All likely multifactorial given above hepatic encephalopathy with poor p.o. intake, volume depletion with ongoing diuretics - Lactic acidosis likely secondary to  profound dehydration and hypovolemia, downtrending appropriately, no longer following - Hold off on further ceftriaxone pending further workup -no signs of infection at this time - Patient remains without leukocytosis, remains afebrile, CT abdomen pelvis with no clear acute abnormalities -imaging consistent with portal hypertension and abdominal varices  Paroxysmal atrial fibrillation - Not on anticoagulation -questionably due to low A-fib burden in the setting of liver disease and elevated risk of bleeding - Med rec notes metoprolol , may transition to propranolol prior to discharge pending patient's clinical improvement -heart rate remains well-controlled, unclear if patient will be able to tolerate beta-blocker  Diastolic CHF - Without acute exacerbation - Torsemide  resumed as above along with spironolactone  and potassium - Last echo in January with EF 55%, G1 DD, normal RV function.  CKD 3 Elevated creatinine, AKI ruled out Hypokalemia -Creatinine baseline around 1, peak 1.3 - Potassium remains borderline low, continue home potassium supplementation, spironolactone  as well as torsemide  as above, follow clinically, repeat morning labs   Prolonged QTc, chronic - QTc 516 overnight -Patient noted to wear 2-week Holter monitor in July of this year for palpitations with multiple episodes of SVT/PACs/PVCs; EKG at that time with QTc of 500; prior EKG in low 500 range   DM, diet controlled, well-controlled Previous A1c 4.9, patient states that her most recent outpatient A1c was around 6, no indication to repeat testing at this time - Continue diet  Hypothyroidism -TSH elevated at 11.7, free T4 within normal limits 0.9 -likely euthyroid - Would recommend repeat testing once acute illness has resolved, in the interim continue home dose of levothyroxine    GERD - Continue home PPI Status post aortic valve replacement - Noted  Chronic anemia, likely anemia of  chronic disease secondary to  above - Hemoglobin stable continue to follow repeat labs, no signs or symptoms of bleeding   Orthostatic hypotension - Continue midodrine  - Unclear why patient is on beta-blocker and midodrine , will likely discontinue beta-blocker as above  DVT prophylaxis: SCDs Start: 04/15/24 1445 Code Status:   Code Status: Full Code Family Communication: Husband updated  Status is: Inpt  Dispo: The patient is from: Home              Anticipated d/c is to: Home              Anticipated d/c date is: 24-48h              Patient currently is not medically stable for discharge  Consultants:  None  Procedures:  None  Antimicrobials:  None   Subjective: No acute issues/events overnight - mental status improving but not yet back to baseline per husband  Objective: Vitals:   04/16/24 1433 04/16/24 2035 04/17/24 0518 04/17/24 0748  BP: 129/65 (!) 115/56 (!) 112/59 127/61  Pulse: 63 60 71 71  Resp: 18 15 18 20   Temp: 98.2 F (36.8 C) 98.4 F (36.9 C) 98.1 F (36.7 C) 97.9 F (36.6 C)  TempSrc: Oral Oral Oral Oral  SpO2: 95% 98% 99% 95%  Weight:      Height:        Intake/Output Summary (Last 24 hours) at 04/17/2024 0825 Last data filed at 04/17/2024 0544 Gross per 24 hour  Intake 960 ml  Output --  Net 960 ml   Filed Weights   04/15/24 1241  Weight: 118.8 kg    Examination:  General:  Pleasantly resting in bed, No acute distress. HEENT:  Normocephalic atraumatic.  Sclerae nonicteric, noninjected.  Extraocular movements intact bilaterally. Neck:  Without mass or deformity.  Trachea is midline. Lungs:  Clear to auscultate bilaterally without rhonchi, wheeze, or rales. Heart:  Regular rate and rhythm.  Without murmurs, rubs, or gallops. Abdomen:  Soft, minimally distended and tender diffusely without PMI rebound or guarding. Extremities: Without cyanosis, clubbing, edema, or obvious deformity. Skin:  Warm and dry, no erythema.  Data Reviewed: I have personally reviewed  following labs and imaging studies  CBC: Recent Labs  Lab 04/15/24 1236 04/16/24 0421 04/17/24 0425  WBC 5.9 4.7 3.7*  NEUTROABS 4.6  --   --   HGB 12.8 10.5* 9.5*  HCT 37.9 31.2* 28.8*  MCV 92.0 92.3 95.0  PLT 54* 41* 43*   Basic Metabolic Panel: Recent Labs  Lab 04/15/24 1236 04/16/24 0421 04/17/24 0425  NA 140 144 143  K 3.4* 3.2* 3.8  CL 106 110 110  CO2 22 25 26   GLUCOSE 139* 109* 75  BUN 13 14 12   CREATININE 1.33* 1.06* 1.01*  CALCIUM  9.7 9.6 9.1   GFR: Estimated Creatinine Clearance: 72.4 mL/min (A) (by C-G formula based on SCr of 1.01 mg/dL (H)). Liver Function Tests: Recent Labs  Lab 04/15/24 1236 04/16/24 0421 04/17/24 0425  AST 51* 47* 46*  ALT 20 17 15   ALKPHOS 159* 128* 106  BILITOT 2.5* 2.0* 1.4*  PROT 5.8* 4.8* 4.2*  ALBUMIN  3.2* 3.1* 2.6*   No results for input(s): LIPASE, AMYLASE in the last 168 hours. Recent Labs  Lab 04/15/24 1236  AMMONIA 81*   Coagulation Profile: Recent Labs  Lab 04/15/24 1702 04/16/24 0421  INR 1.4* 1.5*   CBG: Recent Labs  Lab 04/16/24 0817 04/16/24 1200 04/16/24 1648 04/16/24 2037 04/17/24 0748  GLUCAP  92 117* 104* 84 84   Thyroid  Function Tests: Recent Labs    04/15/24 1236 04/15/24 1702  TSH 11.704*  --   FREET4  --  0.93  T3FREE  --  2.0   Sepsis Labs: Recent Labs  Lab 04/15/24 1443 04/15/24 1710 04/15/24 1820  LATICACIDVEN 4.8* 2.8* 2.3*    Recent Results (from the past 240 hours)  Blood culture (routine x 2)     Status: None (Preliminary result)   Collection Time: 04/15/24  4:40 PM   Specimen: BLOOD RIGHT ARM  Result Value Ref Range Status   Specimen Description BLOOD RIGHT ARM  Final   Special Requests   Final    BOTTLES DRAWN AEROBIC AND ANAEROBIC Blood Culture results may not be optimal due to an inadequate volume of blood received in culture bottles   Culture   Final    NO GROWTH < 24 HOURS Performed at [facility], [address] [place], [place] [address]     Report Status PENDING  Incomplete  Blood culture (routine x 2)     Status: None (Preliminary result)   Collection Time: 04/15/24  5:02 PM   Specimen: BLOOD  Result Value Ref Range Status   Specimen Description BLOOD SITE NOT SPECIFIED  Final   Special Requests   Final    BOTTLES DRAWN AEROBIC AND ANAEROBIC Blood Culture adequate volume   Culture   Final    NO GROWTH < 24 HOURS Performed at [facility], [address], [place], [place] [address]    Report Status PENDING  Incomplete         Radiology Studies: CT ABDOMEN PELVIS W CONTRAST Result Date: 04/15/2024 CLINICAL DATA:  Abdomen pain EXAM: CT ABDOMEN AND PELVIS WITH CONTRAST TECHNIQUE: Multidetector CT imaging of the abdomen and pelvis was performed using the standard protocol following bolus administration of intravenous contrast. RADIATION DOSE REDUCTION: This exam was performed according to the departmental dose-optimization program which includes automated exposure control, adjustment of the mA and/or kV according to patient size and/or use of iterative reconstruction technique. CONTRAST:  75mL OMNIPAQUE  IOHEXOL  350 MG/ML SOLN COMPARISON:  CT 04/08/2023, 04/13/2021 FINDINGS: Lower chest: Lung bases demonstrate small right and trace left pleural effusions. Partial atelectasis in the right greater than left lower lobes. Cardiomegaly with aortic valve prosthesis and mild coronary vascular calcification. Lower sternotomy Hepatobiliary: Cirrhotic morphology of the liver. Chronic calcifications at the periphery of the right hepatic lobe. Cholecystectomy. No biliary dilatation Pancreas: Atrophic.  No inflammation Spleen: Upper normal in size measuring 12.5 cm on coronal views. Adrenals/Urinary Tract: Adrenal glands are normal. Kidneys show no hydronephrosis. Scarring left kidney with a cyst, no imaging follow-up is recommended. Bladder is unremarkable Stomach/Bowel: The stomach is within normal limits. No dilated small bowel. No  acute bowel wall thickening. Few colon diverticula. Vascular/Lymphatic: Moderate aortic atherosclerosis. No aneurysm. No suspicious lymph nodes. Large left abdominal varices are again noted. Reproductive: Hysterectomy.  No adnexal mass Other: No free air. Small volume free fluid in the pelvis. Mild generalized subcutaneous edema Musculoskeletal: No acute osseous abnormality. IMPRESSION: 1. No CT evidence for acute intra-abdominal or pelvic abnormality. 2. Cirrhotic morphology of the liver findings suspicious for portal hypertension including large left abdominal varices and small volume free fluid in the pelvis. 3. Small right and trace left pleural effusions with partial atelectasis in the right greater than left lower lobes. 4. Aortic atherosclerosis. Aortic Atherosclerosis (ICD10-I70.0). Electronically Signed   By: Luke Scott HERO.D.  On: 04/15/2024 15:53   DG Chest Port 1 View Result Date: 04/15/2024 EXAM: 1 VIEW(S) XRAY OF THE CHEST 04/15/2024 01:02:00 PM COMPARISON: 11/12/2023 CLINICAL HISTORY: AMS (altered mental status) FINDINGS: LUNGS AND PLEURA: Chronic elevation of the left hemidiaphragm with left basilar opacity, favor to reflect atelectasis. No overt pulmonary edema. No sizeable pleural effusion. No pneumothorax. HEART AND MEDIASTINUM: Mild cardiomegaly. Prosthetic aortic valve. Median sternotomy. BONES AND SOFT TISSUES: No acute osseous abnormality. IMPRESSION: 1. Chronic elevation of the left hemidiaphragm with left basilar opacity, favored to reflect atelectasis. 2. Mild cardiomegaly. Electronically signed by: Shahmeer Lateef MD 04/15/2024 01:30 PM EST RP Workstation: [id number]   CT Head Wo Contrast Result Date: 04/15/2024 EXAM: CT HEAD WITHOUT CONTRAST 04/15/2024 12:43:00 PM TECHNIQUE: CT of the head was performed without the administration of intravenous contrast. Automated exposure control, iterative reconstruction, and/or weight based adjustment of the mA/kV was utilized to reduce the  radiation dose to as low as reasonably achievable. COMPARISON: None available. CLINICAL HISTORY: Altered mental status. FINDINGS: BRAIN AND VENTRICLES: There is no evidence of an acute infarct, intracranial hemorrhage, mass, midline shift, hydrocephalus, or extra-axial fluid collection. Cerebral volume is normal. Cerebral white matter hypodensities are nonspecific but compatible with mild chronic small vessel ischemic disease. Calcified atherosclerosis at the skull base. ORBITS: Bilateral cataract extraction. SINUSES: No acute abnormality. SOFT TISSUES AND SKULL: No acute soft tissue abnormality. No skull fracture. IMPRESSION: 1. No acute intracranial abnormality. 2. Mild chronic small vessel ischemic disease. Electronically signed by: Dasie Hamburg MD 04/15/2024 01:11 PM EST RP Workstation: [id number]        Scheduled Meds:  insulin  aspart  0-15 Units Subcutaneous TID WC   lactulose   30 g Oral TID   levothyroxine   137 mcg Oral Q0600   linaclotide   290 mcg Oral QAC breakfast   midodrine   5 mg Oral TID WC   nitrofurantoin  100 mg Oral Daily   pantoprazole   40 mg Oral Daily   potassium chloride  SA  20 mEq Oral Daily   rosuvastatin   5 mg Oral Daily   torsemide   40 mg Oral Q2000   Continuous Infusions:   LOS: 0 days   Time spent:  Elsie JAYSON Montclair, DO [facility]  If 7PM-7AM, please contact night-coverage [email or web address]  04/17/2024, 8:25 AM

## 2024-04-18 ENCOUNTER — Inpatient Hospital Stay

## 2024-04-18 ENCOUNTER — Inpatient Hospital Stay: Admitting: Oncology

## 2024-04-18 DIAGNOSIS — G9340 Encephalopathy, unspecified: Secondary | ICD-10-CM | POA: Diagnosis not present

## 2024-04-18 LAB — GLUCOSE, CAPILLARY
Glucose-Capillary: 78 mg/dL (ref 70–99)
Glucose-Capillary: 81 mg/dL (ref 70–99)
Glucose-Capillary: 89 mg/dL (ref 70–99)
Glucose-Capillary: 101 mg/dL — ABNORMAL HIGH (ref 70–99)

## 2024-04-18 LAB — COMPREHENSIVE METABOLIC PANEL WITH GFR
ALT: 18 U/L (ref 0–44)
AST: 52 U/L — ABNORMAL HIGH (ref 15–41)
Albumin: 2.8 g/dL — ABNORMAL LOW (ref 3.5–5.0)
Alkaline Phosphatase: 117 U/L (ref 38–126)
Anion gap: 7 (ref 5–15)
BUN: 15 mg/dL (ref 8–23)
CO2: 26 mmol/L (ref 22–32)
Calcium: 9.3 mg/dL (ref 8.9–10.3)
Chloride: 108 mmol/L (ref 98–111)
Creatinine, Ser: 1.29 mg/dL — ABNORMAL HIGH (ref 0.44–1.00)
GFR, Estimated: 45 mL/min — ABNORMAL LOW (ref >60)
Glucose, Bld: 76 mg/dL (ref 70–99)
Potassium: 3.7 mmol/L (ref 3.5–5.1)
Sodium: 141 mmol/L (ref 135–145)
Total Bilirubin: 1.4 mg/dL — ABNORMAL HIGH (ref 0.0–1.2)
Total Protein: 4.4 g/dL — ABNORMAL LOW (ref 6.5–8.1)

## 2024-04-18 LAB — AMMONIA: Ammonia: 31 umol/L (ref 9–35)

## 2024-04-18 MED ORDER — RIFAXIMIN 550 MG PO TABS
550.0000 mg | ORAL_TABLET | Freq: Two times a day (BID) | ORAL | Status: DC
  Administered 2024-04-18 – 2024-04-19 (×3): 550 mg via ORAL
  Filled 2024-04-18 (×3): qty 1

## 2024-04-18 NOTE — Evaluation (Signed)
 Occupational Therapy Evaluation Patient Details Name: Joanna Alvarado MRN: [id number] DOB: [birth year] Today's Date: 04/18/2024   History of Present Illness   Pt is a 68 y.o. female admitted with acute encephalopathy in the setting of hyperammonemia. PMH significant for atrial fibrillation, diabetes, CKD 3, hypothyroidism, GERD, status post aortic valve replacement, diastolic CHF, cirrhosis, anemia, orthostatic hypotension, fibromyalgia.     Clinical Impressions Pt admitted with above. Pt pleasantly confused and spouse assists with PLOF/home setup information. Prior to admission, pt lives with spouse in one level home with 3 STE, uses RW for mobility, spouse assists with mobility & ADLs intermittently. Pt typically able to get out of bed on her own, spouse assists for tub transfers and stairs. Pt A&Ox3, follows commands but demonstrates difficulties answering questions directly. Pt performs bed mobility with supervision and increased time, CGA + RW for multiple STS transfers from various surface levels (recliner, BSC, bed), and is able to perform 2 bouts of short distance mobility 3-5 ft with RW from recliner to [facility]. MAX A for clothing mgmt + pericare from a standing position. Pt reporting dizziness after sitting upright that resolves. Pt would benefit from skilled OT services to address noted impairments and functional limitations (see below for any additional details) in order to maximize safety and independence while minimizing falls risk and caregiver burden. Anticipate pt will progress well and benefit from [facility] OT at hospital discharge.       If plan is discharge home, recommend the following:   A little help with walking and/or transfers;A little help with bathing/dressing/bathroom;Assist for transportation;Supervision due to cognitive status;Help with stairs or ramp for entrance     Functional Status Assessment   Patient has had a recent decline in their functional status and  demonstrates the ability to make significant improvements in function in a reasonable and predictable amount of time.     Equipment Recommendations   None recommended by OT      Precautions/Restrictions   Precautions Precautions: Fall Restrictions Weight Bearing Restrictions Per Provider Order: No     Mobility Bed Mobility Overal bed mobility: Needs Assistance Bed Mobility: Supine to Sit     Supine to sit: Supervision          Transfers Overall transfer level: Needs assistance Equipment used: Rolling walker (2 wheels) Transfers: Sit to/from Stand, Bed to chair/wheelchair/BSC Sit to Stand: Contact guard assist, From elevated surface     Step pivot transfers: Contact guard assist     General transfer comment: multiple STS transfers completed from various surface levels - elevated bed, recliner and BSC. Pt with good hand placement, OT stabilizes RW and pt is able to rise from elevated bed height.      Balance Overall balance assessment: Needs assistance Sitting-balance support: Feet supported, No upper extremity supported Sitting balance-Leahy Scale: Good     Standing balance support: During functional activity, Reliant on assistive device for balance Standing balance-Leahy Scale: Fair Standing balance comment: forward lean in standing, pt able to self correct                           ADL either performed or assessed with clinical judgement   ADL Overall ADL's : Needs assistance/impaired Eating/Feeding: Independent;Sitting   Grooming: Wash/dry hands;Sitting;Set up Grooming Details (indicate cue type and reason): uses hand santitizer seated in reclienr Upper Body Bathing: Sitting;Set up   Lower Body Bathing: Sit to/from stand;Minimal assistance   Upper Body Dressing : Sitting;Set  up   Lower Body Dressing: Sit to/from stand;Minimal assistance   Toilet Transfer: Contact guard assist;BSC/3in1;Rolling walker (2 wheels);Ambulation Toilet  Transfer Details (indicate cue type and reason): pt completes 2x transfers to / from [facility]. Pt able to take 3-4 steps from recliner to step pivot to [facility], uses good technique and control to sit on bari BSC, STS with CGA to walk back to recliner. Pt uses BSC at home if necessary. Toileting- Clothing Manipulation and Hygiene: Maximal assistance;Sit to/from stand Toileting - Clothing Manipulation Details (indicate cue type and reason): MAX A x2 for standing pericare     Functional mobility during ADLs: Contact guard assist;Rolling walker (2 wheels) General ADL Comments: Pt transfers to recliner, and verbalizes need for BM. Walks 3-4 ft to [facility] using RW + CGA, good technique to control to seated position. MAX A for pericare and back to recliner. Immediately after pt is seated and settled, she verbalizes need to use BSC again.     Vision Baseline Vision/History: 1 Wears glasses Ability to See in Adequate Light: 0 Adequate Patient Visual Report: No change from baseline              Pertinent Vitals/Pain Pain Assessment Pain Assessment: Faces Faces Pain Scale: Hurts little more Pain Location: BLE Pain Descriptors / Indicators: Aching, Discomfort, Dull, Grimacing Pain Intervention(s): Limited activity within patient's tolerance, Monitored during session     Extremity/Trunk Assessment Upper Extremity Assessment Upper Extremity Assessment: Right hand dominant;Generalized weakness (grossly 3+/5)   Lower Extremity Assessment Lower Extremity Assessment: Defer to PT evaluation;RLE deficits/detail RLE Deficits / Details: pt reports she is scheduled to have R knee replacement   Cervical / Trunk Assessment Cervical / Trunk Assessment: Normal   Communication Communication Communication: Impaired Factors Affecting Communication: Difficulty expressing self (often trailing off mid-sentence to a different topic, difficulties answering questions appropriately in a given timeframe)   Cognition Arousal:  Alert Behavior During Therapy: WFL for tasks assessed/performed Cognition: Cognition impaired             OT - Cognition Comments: Spouse in room states pt not at cognitive baseline, pt confused, often tangetial, follows commands well and is participatory                 Following commands: Intact       Cueing  General Comments   Cueing Techniques: Verbal cues  VSS. Pt reporting dizziness that resolves once seated at EOB.           Home Living Family/patient expects to be discharged to:: Private residence Living Arrangements: Spouse/significant other Available Help at Discharge: Available 24 hours/day Type of Home: House Home Access: Stairs to enter [employer] of Steps: 3 Entrance Stairs-Rails: Right Home Layout: One level     Bathroom Shower/Tub: Engineer, Production Accessibility: No   Home Equipment: Pharmacist, Hospital (2 wheels);Rollator (4 wheels);Hand held shower head;Lift chair;BSC/3in1   Additional Comments: cannot access bathroom using RW, sleeps in lift chair or bed      Prior Functioning/Environment Prior Level of Function : Needs assist       Physical Assist : Mobility (physical);ADLs (physical) Mobility (physical): Transfers;Gait;Stairs ADLs (physical): Bathing;IADLs Mobility Comments: uses RW, occassionally falls backwards on bed ADLs Comments: spouse assists with shower transfers, LB dressing,    OT Problem List: Decreased strength;Decreased activity tolerance;Impaired balance (sitting and/or standing);Decreased cognition;Decreased knowledge of use of DME or AE   OT Treatment/Interventions: Self-care/ADL training;Therapeutic exercise;DME and/or AE instruction;Energy conservation;Therapeutic activities;Patient/family education;Balance training  OT Goals(Current goals can be found in the care plan section)   Acute Rehab OT Goals OT Goal Formulation: With patient/family Time For Goal Achievement:  05/02/24 Potential to Achieve Goals: Good   OT Frequency:  Min 2X/week       AM-PAC OT 6 Clicks Daily Activity     Outcome Measure Help from another person eating meals?: None Help from another person taking care of personal grooming?: None Help from another person toileting, which includes using toliet, bedpan, or urinal?: A Lot Help from another person bathing (including washing, rinsing, drying)?: A Lot Help from another person to put on and taking off regular upper body clothing?: A Little Help from another person to put on and taking off regular lower body clothing?: A Lot 6 Click Score: 17   End of Session Equipment Utilized During Treatment: Gait belt;Rolling walker (2 wheels) Nurse Communication: Mobility status  Activity Tolerance: Patient tolerated treatment well Patient left: in chair;with call bell/phone within reach;with chair alarm set  OT Visit Diagnosis: Muscle weakness (generalized) (M62.81);Other abnormalities of gait and mobility (R26.89);Unsteadiness on feet (R26.81)                Time: 8787-8750 OT Time Calculation (min): 37 min Charges:  OT General Charges $OT Visit: 1 Visit OT Evaluation $OT Eval Low Complexity: 1 Low OT Treatments $Self Care/Home Management : 23-37 mins  Ekam Bonebrake L. Lakelyn Straus, OTR/L  04/18/24, 2:11 PM

## 2024-04-18 NOTE — Progress Notes (Signed)
 PROGRESS NOTE    Joanna Alvarado  FMW:[id number] DOB: 1955-12-03 DOA: 04/15/2024 PCP: Ina Marcellus RAMAN, MD   Brief Narrative:  Joanna Alvarado is a 68 y.o. female with medical history significant of atrial fibrillation, diabetes, CKD 3, hypothyroidism, GERD, status post aortic valve replacement, diastolic CHF, cirrhosis, anemia, orthostatic hypotension, fibromyalgia presenting with altered mental status.    Assessment & Plan:   Principal Problem:   Acute encephalopathy Active Problems:   Primary hypothyroidism   Type 2 diabetes mellitus without complications (HCC)   CKD (chronic kidney disease) stage 3, GFR 30-59 ml/min (HCC)   S/P aortic valve replacement with bioprosthetic valve   Iron deficiency anemia   Gastroesophageal reflux disease without esophagitis   CHF (congestive heart failure) (HCC)   Other cirrhosis of liver (HCC)   Paroxysmal atrial fibrillation (HCC)   Portal hypertension (HCC)   Fibromyalgia  Acute encephalopathy, presumed hepatic encephalopathy in the setting of hyperammonemia, resolving Cirrhosis of unknown etiology - Known history of liver disease/cirrhosis and is being worked up by [facility] outpatient for possible transplant- Noted MASH vs other etiology - Unclear if elevated lactic acid level above is related to this baseline liver disease as there is not been a lactic acid checked in 4 to 5 years per chart review - Mental status improving but not yet back to baseline - Ammonia now downtrending with increased lactulose  30mg  QID -add rifaximin given poor bowel movement/ongoing constipation - Trend LFTs, PT, INR - Continue home torsemide , initiate spironolactone  (Not true allergy - tolerated multiple doses at this point) - Continue to monitor potassium given changes in spironolactone  torsemide  lactulose  and rifaximin, high risk for hypokalemia  Infection ruled out Patient meets SIRS criteria only Lactic acidosis  - All likely multifactorial given above  hepatic encephalopathy with poor p.o. intake, volume depletion with ongoing diuretics - Lactic acidosis likely secondary to profound dehydration and hypovolemia, downtrending appropriately, no longer following - Hold off on further ceftriaxone pending further workup -no signs of infection at this time - Patient remains without leukocytosis, remains afebrile, CT abdomen pelvis with no clear acute abnormalities -imaging consistent with portal hypertension and abdominal varices  Paroxysmal atrial fibrillation - Not on anticoagulation -questionably due to low A-fib burden in the setting of liver disease and elevated risk of bleeding - Med rec notes metoprolol , may transition to propranolol prior to discharge pending patient's clinical improvement -heart rate remains well-controlled, unclear if patient will be able to tolerate beta-blocker  Diastolic CHF - Without acute exacerbation - Torsemide  resumed as above along with spironolactone  and potassium - Last echo in January with EF 55%, G1 DD, normal RV function.  CKD 3 Elevated creatinine, AKI ruled out Hypokalemia -Creatinine baseline around 1, peak 1.3 - Potassium remains borderline low, continue home potassium supplementation, spironolactone  as well as torsemide  as above, follow clinically, repeat morning labs   Prolonged QTc, chronic - QTc routinely 500-520 but stable - Patient noted to wear 2-week Holter monitor in July of this year for palpitations with multiple episodes of SVT/PACs/PVCs; EKG at that time with QTc of 500; prior EKG in low 500 range   DM, diet controlled, well-controlled Previous A1c 4.9, patient states that her most recent outpatient A1c was around 6, no indication to repeat testing at this time - Continue diet  Hypothyroidism -TSH elevated at 11.7, free T4 within normal limits 0.9 -likely euthyroid - Would recommend repeat testing once acute illness has resolved, in the interim continue home dose of levothyroxine   GERD - Continue home PPI Status post aortic valve replacement - Noted  Chronic anemia, likely anemia of chronic disease secondary to above - Hemoglobin stable continue to follow repeat labs, no signs or symptoms of bleeding   Orthostatic hypotension - Continue midodrine  - Unclear why patient is on beta-blocker and midodrine , will likely discontinue beta-blocker as above  DVT prophylaxis: SCDs Start: 04/15/24 1445 Code Status:   Code Status: Full Code Family Communication: Husband updated  Status is: Inpt  Dispo: The patient is from: Home              Anticipated d/c is to: Home              Anticipated d/c date is: 24-48h              Patient currently is not medically stable for discharge  Consultants:  None  Procedures:  None  Antimicrobials:  None   Subjective: No acute issues/events overnight - mental status improving -patient denies nausea vomiting diarrhea headache fevers chills or chest pain.  Ongoing constipation despite multiple medications as above, reports 1 small bowel movement   Objective: Vitals:   04/17/24 0748 04/17/24 1342 04/17/24 2128 04/18/24 0439  BP: 127/61 (!) 108/54 (!) 109/53 (!) 110/56  Pulse: 71 68 74 73  Resp: 20 18 18 18   Temp: 97.9 F (36.6 C) 97.7 F (36.5 C) 99.2 F (37.3 C) 98.3 F (36.8 C)  TempSrc: Oral Oral Oral Oral  SpO2: 95% 97% 95% 97%  Weight:      Height:        Intake/Output Summary (Last 24 hours) at 04/18/2024 0739 Last data filed at 04/18/2024 0604 Gross per 24 hour  Intake 840 ml  Output --  Net 840 ml   Filed Weights   04/15/24 1241  Weight: 118.8 kg    Examination:  General:  Pleasantly resting in bed, No acute distress. HEENT:  Normocephalic atraumatic.  Sclerae nonicteric, noninjected.  Extraocular movements intact bilaterally. Neck:  Without mass or deformity.  Trachea is midline. Lungs:  Clear to auscultate bilaterally without rhonchi, wheeze, or rales. Heart:  Regular rate and rhythm.   Without murmurs, rubs, or gallops. Abdomen:  Soft, minimally distended and tender diffusely without PMI rebound or guarding. Extremities: Without cyanosis, clubbing, edema, or obvious deformity. Skin:  Warm and dry, no erythema.  Data Reviewed: I have personally reviewed following labs and imaging studies  CBC: Recent Labs  Lab 04/15/24 1236 04/16/24 0421 04/17/24 0425  WBC 5.9 4.7 3.7*  NEUTROABS 4.6  --   --   HGB 12.8 10.5* 9.5*  HCT 37.9 31.2* 28.8*  MCV 92.0 92.3 95.0  PLT 54* 41* 43*   Basic Metabolic Panel: Recent Labs  Lab 04/15/24 1236 04/16/24 0421 04/17/24 0425 04/18/24 0423  NA 140 144 143 141  K 3.4* 3.2* 3.8 3.7  CL 106 110 110 108  CO2 22 25 26 26   GLUCOSE 139* 109* 75 76  BUN 13 14 12 15   CREATININE 1.33* 1.06* 1.01* 1.29*  CALCIUM  9.7 9.6 9.1 9.3   GFR: Estimated Creatinine Clearance: 56.7 mL/min (A) (by C-G formula based on SCr of 1.29 mg/dL (H)). Liver Function Tests: Recent Labs  Lab 04/15/24 1236 04/16/24 0421 04/17/24 0425 04/18/24 0423  AST 51* 47* 46* 52*  ALT 20 17 15 18   ALKPHOS 159* 128* 106 117  BILITOT 2.5* 2.0* 1.4* 1.4*  PROT 5.8* 4.8* 4.2* 4.4*  ALBUMIN  3.2* 3.1* 2.6* 2.8*   No results  for input(s): LIPASE, AMYLASE in the last 168 hours. Recent Labs  Lab 04/15/24 1236 04/17/24 1033 04/18/24 0423  AMMONIA 81* 99* 31   Coagulation Profile: Recent Labs  Lab 04/15/24 1702 04/16/24 0421  INR 1.4* 1.5*   CBG: Recent Labs  Lab 04/17/24 0748 04/17/24 1143 04/17/24 1653 04/17/24 2125 04/18/24 0736  GLUCAP 84 80 94 98 78   Thyroid  Function Tests: Recent Labs    04/15/24 1236 04/15/24 1702  TSH 11.704*  --   FREET4  --  0.93  T3FREE  --  2.0   Sepsis Labs: Recent Labs  Lab 04/15/24 1443 04/15/24 1710 04/15/24 1820  LATICACIDVEN 4.8* 2.8* 2.3*    Recent Results (from the past 240 hours)  Blood culture (routine x 2)     Status: None (Preliminary result)   Collection Time: 04/15/24  4:40 PM    Specimen: BLOOD RIGHT ARM  Result Value Ref Range Status   Specimen Description BLOOD RIGHT ARM  Final   Special Requests   Final    BOTTLES DRAWN AEROBIC AND ANAEROBIC Blood Culture results may not be optimal due to an inadequate volume of blood received in culture bottles   Culture   Final    NO GROWTH 3 DAYS Performed at [facility], [address], [place], [place] [address]    Report Status PENDING  Incomplete  Blood culture (routine x 2)     Status: None (Preliminary result)   Collection Time: 04/15/24  5:02 PM   Specimen: BLOOD  Result Value Ref Range Status   Specimen Description BLOOD SITE NOT SPECIFIED  Final   Special Requests   Final    BOTTLES DRAWN AEROBIC AND ANAEROBIC Blood Culture adequate volume   Culture   Final    NO GROWTH 3 DAYS Performed at [facility], [address], [place], [place] [address]    Report Status PENDING  Incomplete         Radiology Studies: No results found.       Scheduled Meds:  insulin  aspart  0-15 Units Subcutaneous TID WC   lactulose   30 g Oral QID   levothyroxine   137 mcg Oral Q0600   linaclotide   290 mcg Oral QAC breakfast   midodrine   5 mg Oral TID WC   nitrofurantoin  100 mg Oral Daily   pantoprazole   40 mg Oral Daily   potassium chloride  SA  20 mEq Oral Daily   rosuvastatin   5 mg Oral Daily   spironolactone   25 mg Oral Daily   torsemide   40 mg Oral Q2000   Continuous Infusions:   LOS: 1 day   Time spent:  Elsie JAYSON Montclair, DO [facility]  If 7PM-7AM, please contact night-coverage [email or web address]  04/18/2024, 7:39 AM

## 2024-04-18 NOTE — Evaluation (Signed)
 Physical Therapy Evaluation Patient Details Name: Joanna Alvarado MRN: [id number] DOB: 1956-03-30 Today's Date: 04/18/2024  History of Present Illness  Pt is a 68 y.o. female admitted with acute encephalopathy in the setting of hyperammonemia. PMH significant for atrial fibrillation, diabetes, CKD 3, hypothyroidism, GERD, status post aortic valve replacement, diastolic CHF, cirrhosis, anemia, orthostatic hypotension, fibromyalgia.  Clinical Impression  Pt seated in recliner with friends and family present in room, agreeable to therapy evaluation. Vitals assessed in seated and standing secondary to reported dizziness and previous low BP, however vitals stable and WFL. Pt ambulates with RW at CGA 158ft, limited by pain to R knee and decreased functional activity tolerance. Continued to report dizziness throughout session, however sxs did not worsen with activity. Returned to room, transferred back to bed with assistance needed to bring RLE into elevated bed similar to home environment. In standing and with ambulation, R knee remains flexed and lacking ~15 degrees of extension secondary to pain. She will continue to benefit from skilled physical therapy intervention while admitted to the acute hospital. PT recommends follow up therapy with HHPT. Spouse present during session and supportive.         If plan is discharge home, recommend the following: A little help with walking and/or transfers;A little help with bathing/dressing/bathroom;Assistance with cooking/housework   Can travel by private vehicle        Equipment Recommendations None recommended by PT  Recommendations for Other Services       Functional Status Assessment Patient has had a recent decline in their functional status and demonstrates the ability to make significant improvements in function in a reasonable and predictable amount of time.     Precautions / Restrictions Precautions Precautions: Fall Restrictions Weight  Bearing Restrictions Per Provider Order: No      Mobility  Bed Mobility Overal bed mobility: Needs Assistance Bed Mobility: Sit to Supine       Sit to supine: Min assist   General bed mobility comments: completed with HOB flat, use of bedrail, and assistance to bring RLE into bed    Transfers Overall transfer level: Needs assistance Equipment used: Rolling walker (2 wheels) Transfers: Sit to/from Stand, Bed to chair/wheelchair/BSC Sit to Stand: Min assist   Step pivot transfers: Contact guard assist       General transfer comment: STS from low recliner requiring MIN A with VC for proper hand placement; demos increased LLE weightbearing due to pain in R knee; with practice, demos good carryover of VC.    Ambulation/Gait Ambulation/Gait assistance: Contact guard assist Gait Distance (Feet): 125 Feet Assistive device: Rolling walker (2 wheels) Gait Pattern/deviations: Trunk flexed, Knee flexed in stance - right, Decreased stance time - right, Antalgic Gait velocity: decr     General Gait Details: h/o R knee pain with need for TKA resulting in decreased weight acceptance onto limb, keeping R knee bent in standing, heavy UE reliance on RW and forward flexed posture  Stairs            Wheelchair Mobility     Tilt Bed    Modified Rankin (Stroke Patients Only)       Balance Overall balance assessment: Needs assistance Sitting-balance support: Feet supported, No upper extremity supported Sitting balance-Leahy Scale: Good     Standing balance support: During functional activity, Reliant on assistive device for balance Standing balance-Leahy Scale: Fair Standing balance comment: forward flexed posture in standing with heavy reliance on UE  Pertinent Vitals/Pain      Home Living Family/patient expects to be discharged to:: Private residence Living Arrangements: Spouse/significant other Available Help at Discharge:  Available 24 hours/day Type of Home: House Home Access: Stairs to enter Entrance Stairs-Rails: Right Entrance Stairs-Number of Steps: 3   Home Layout: One level Home Equipment: Pharmacist, Hospital (2 wheels);Rollator (4 wheels);Hand held shower head;Lift chair;BSC/3in1 Additional Comments: cannot access bathroom using RW, sleeps in lift chair or bed    Prior Function         Physical Assist : Mobility (physical);ADLs (physical)   ADLs (physical): Bathing;IADLs Mobility Comments: uses RW, occassionally falls backwards on bed ADLs Comments: spouse assists with shower transfers, LB dressing,     Extremity/Trunk Assessment                Communication        Cognition                                         Cueing       General Comments General comments (skin integrity, edema, etc.): dizzines reported in seated and standing, states worse when I move my eyes quick and has history of vertigo    Exercises     Assessment/Plan    PT Assessment Patient needs continued PT services  PT Problem List Decreased strength;Decreased range of motion;Decreased activity tolerance;Decreased balance;Decreased mobility;Pain       PT Treatment Interventions Gait training;Therapeutic activities;Therapeutic exercise;Balance training;Patient/family education    PT Goals (Current goals can be found in the Care Plan section)  Acute Rehab PT Goals Patient Stated Goal: get back to crafting and doing craft fairs PT Goal Formulation: With patient Time For Goal Achievement: 05/02/24 Potential to Achieve Goals: Fair    Frequency Min 3X/week     Co-evaluation               AM-PAC PT 6 Clicks Mobility  Outcome Measure Help needed turning from your back to your side while in a flat bed without using bedrails?: A Little Help needed moving from lying on your back to sitting on the side of a flat bed without using bedrails?: A Little Help needed moving  to and from a bed to a chair (including a wheelchair)?: A Little Help needed standing up from a chair using your arms (e.g., wheelchair or bedside chair)?: A Little Help needed to walk in hospital room?: A Little Help needed climbing 3-5 steps with a railing? : A Little 6 Click Score: 18    End of Session Equipment Utilized During Treatment: Gait belt Activity Tolerance: Patient limited by pain;Patient limited by fatigue Patient left: in bed;with family/visitor present;with bed alarm set;with call bell/phone within reach   PT Visit Diagnosis: Difficulty in walking, not elsewhere classified (R26.2);Dizziness and giddiness (R42)    Time: 8552-8479 PT Time Calculation (min) (ACUTE ONLY): 33 min   Charges:   PT Evaluation $PT Eval Moderate Complexity: 1 Mod PT Treatments $Gait Training: 8-22 mins           Isaiah DEL. Tesla Keeler, PT, DPT   [employer] 04/18/2024, 4:10 PM

## 2024-04-19 ENCOUNTER — Other Ambulatory Visit (HOSPITAL_COMMUNITY): Payer: Self-pay

## 2024-04-19 DIAGNOSIS — G9340 Encephalopathy, unspecified: Secondary | ICD-10-CM | POA: Diagnosis not present

## 2024-04-19 LAB — CBC
HCT: 33.5 % — ABNORMAL LOW (ref 36.0–46.0)
Hemoglobin: 11.1 g/dL — ABNORMAL LOW (ref 12.0–15.0)
MCH: 31.4 pg (ref 26.0–34.0)
MCHC: 33.1 g/dL (ref 30.0–36.0)
MCV: 94.6 fL (ref 80.0–100.0)
Platelets: 50 K/uL — ABNORMAL LOW (ref 150–400)
RBC: 3.54 MIL/uL — ABNORMAL LOW (ref 3.87–5.11)
RDW: 14.6 % (ref 11.5–15.5)
WBC: 4 K/uL (ref 4.0–10.5)
nRBC: 0 % (ref 0.0–0.2)

## 2024-04-19 LAB — COMPREHENSIVE METABOLIC PANEL WITH GFR
ALT: 21 U/L (ref 0–44)
AST: 62 U/L — ABNORMAL HIGH (ref 15–41)
Albumin: 3.1 g/dL — ABNORMAL LOW (ref 3.5–5.0)
Alkaline Phosphatase: 135 U/L — ABNORMAL HIGH (ref 38–126)
Anion gap: 9 (ref 5–15)
BUN: 17 mg/dL (ref 8–23)
CO2: 27 mmol/L (ref 22–32)
Calcium: 10 mg/dL (ref 8.9–10.3)
Chloride: 106 mmol/L (ref 98–111)
Creatinine, Ser: 1.07 mg/dL — ABNORMAL HIGH (ref 0.44–1.00)
GFR, Estimated: 56 mL/min — ABNORMAL LOW (ref >60)
Glucose, Bld: 80 mg/dL (ref 70–99)
Potassium: 3.7 mmol/L (ref 3.5–5.1)
Sodium: 142 mmol/L (ref 135–145)
Total Bilirubin: 1.7 mg/dL — ABNORMAL HIGH (ref 0.0–1.2)
Total Protein: 5.1 g/dL — ABNORMAL LOW (ref 6.5–8.1)

## 2024-04-19 LAB — AMMONIA: Ammonia: 26 umol/L (ref 9–35)

## 2024-04-19 LAB — GLUCOSE, CAPILLARY
Glucose-Capillary: 77 mg/dL (ref 70–99)
Glucose-Capillary: 130 mg/dL — ABNORMAL HIGH (ref 70–99)

## 2024-04-19 MED ORDER — RIFAXIMIN 550 MG PO TABS
550.0000 mg | ORAL_TABLET | Freq: Two times a day (BID) | ORAL | 0 refills | Status: AC
  Filled 2024-04-19 – 2024-04-20 (×2): qty 60, 30d supply, fill #0

## 2024-04-19 MED ORDER — TORSEMIDE 20 MG PO TABS
40.0000 mg | ORAL_TABLET | Freq: Every day | ORAL | 0 refills | Status: AC
  Filled 2024-04-19: qty 60, 30d supply, fill #0

## 2024-04-19 MED ORDER — SPIRONOLACTONE 25 MG PO TABS
25.0000 mg | ORAL_TABLET | Freq: Every day | ORAL | 0 refills | Status: DC
  Filled 2024-04-19: qty 30, 30d supply, fill #0

## 2024-04-19 MED ORDER — POTASSIUM CHLORIDE CRYS ER 20 MEQ PO TBCR
20.0000 meq | EXTENDED_RELEASE_TABLET | Freq: Every day | ORAL | 0 refills | Status: AC
  Filled 2024-04-19 – 2024-04-20 (×2): qty 30, 30d supply, fill #0

## 2024-04-19 MED ORDER — LACTULOSE 10 GM/15ML PO SOLN
30.0000 g | Freq: Four times a day (QID) | ORAL | 0 refills | Status: DC
  Filled 2024-04-19: qty 236, 2d supply, fill #0

## 2024-04-19 NOTE — Discharge Summary (Signed)
 Physician Discharge Summary  Joanna Alvarado FMW:[id number] DOB: Dec 07, 1955 DOA: 04/15/2024  PCP: Ina Marcellus RAMAN, MD  Admit date: 04/15/2024 Discharge date: 04/19/2024  Admitted From: Home Disposition: Home  Recommendations for Outpatient Follow-up:  Follow up with PCP in 1-2 weeks Follow-up with liver specialist in 1 to 2 weeks as scheduled  Home Health: PT Equipment/Devices: None  Discharge Condition: Stable CODE STATUS: Full Diet recommendation:    Brief/Interim Summary: Joanna Alvarado is a 68 y.o. female with medical history significant of atrial fibrillation, diabetes, CKD 3a, hypothyroidism, GERD, status post aortic valve replacement, diastolic CHF, cirrhosis, anemia, orthostatic hypotension, fibromyalgia presenting with altered mental status.  Patient admitted as above with acute encephalopathy notably hepatic in origin given elevated ammonia, now downtrending with new regimen including increased lactulose , spironolactone  and rifaximin.  She is now appears to be back at baseline per family at bedside, lengthy discussion in regards to patient's diagnosis and need for medication compliance, close follow-up with PCP in 1 to 2 weeks, patient has appointment with hepatologist next week per her recollection, would recommend keeping his appointment for close outpatient follow-up.  Patient did have elevated lactic acid meeting SIRS criteria but given no overt infection she was not ultimately treated with antibiotics.  Patient's other chronic comorbid conditions appear to be quite stable, mild elevation of creatinine on CKD 3a does not meet criteria for AKI, chronic QTc prolongation ongoing.multiple medication changes as below including multiple CNS depressants, antihypertensives given encephalopathy and hypotension at baseline. Patient aware otherwise continue home medications as outlined below.  Discharge Diagnoses:  Principal Problem:   Acute encephalopathy Active Problems:   Primary  hypothyroidism   Type 2 diabetes mellitus without complications (HCC)   CKD (chronic kidney disease) stage 3, GFR 30-59 ml/min (HCC)   S/P aortic valve replacement with bioprosthetic valve   Iron deficiency anemia   Gastroesophageal reflux disease without esophagitis   CHF (congestive heart failure) (HCC)   Other cirrhosis of liver (HCC)   Paroxysmal atrial fibrillation (HCC)   Portal hypertension (HCC)   Fibromyalgia  Acute encephalopathy, presumed hepatic encephalopathy in the setting of hyperammonemia, resolving Cirrhosis of unknown etiology - Known history of liver disease/cirrhosis and is being worked up by [facility] outpatient for possible transplant- Noted MASH vs other etiology - Unclear if elevated lactic acid level above is related to this baseline liver disease as there is not been a lactic acid checked in 4 to 5 years per chart review - Mental status improving but not yet back to baseline - Ammonia now downtrending with increased lactulose  30mg  QID -addition of rifaximin with multiple bowel movements over the past 24 hours - Trend LFTs, PT, INR - Continue home torsemide , continue spironolactone  (Not true allergy - tolerated multiple doses at this point) - Potassium remains well-controlled   Infection ruled out Patient meets SIRS criteria only Lactic acidosis  - All likely multifactorial given above hepatic encephalopathy with poor p.o. intake, volume depletion with ongoing diuretics - Lactic acidosis likely secondary to profound dehydration and hypovolemia, downtrending appropriately, no longer following - Hold off on further ceftriaxone pending further workup -no signs of infection at this time - Patient remains without leukocytosis, remains afebrile, CT abdomen pelvis with no clear acute abnormalities -imaging consistent with portal hypertension and abdominal varices   Paroxysmal atrial fibrillation - Not on anticoagulation -questionably due to low A-fib burden in the  setting of liver disease and elevated risk of bleeding - Med rec notes metoprolol , may transition to propranolol prior  to discharge pending patient's clinical improvement -heart rate remains well-controlled, unclear if patient will be able to tolerate beta-blocker   Diastolic CHF - Without acute exacerbation - Torsemide  resumed as above along with spironolactone  and potassium - Last echo in January with EF 55%, G1 DD, normal RV function.   CKD 3a Elevated creatinine, AKI ruled out Hypokalemia -Creatinine baseline around 1, peak 1.3 - Potassium remains borderline low, continue home potassium supplementation, spironolactone  as well as torsemide  as above, follow clinically, repeat morning labs   Prolonged QTc, chronic - QTc routinely 500-520 but stable - Patient noted to wear 2-week Holter monitor in July of this year for palpitations with multiple episodes of SVT/PACs/PVCs; EKG at that time with QTc of 500; prior EKG in low 500 range   DM, diet controlled, well-controlled Previous A1c 4.9, patient states that her most recent outpatient A1c was around 6, no indication to repeat testing at this time - Continue diet   Hypothyroidism -TSH elevated at 11.7, free T4 within normal limits 0.9 -likely euthyroid - Would recommend repeat testing once acute illness has resolved, in the interim continue home dose of levothyroxine     GERD - Continue home PPI Status post aortic valve replacement - Noted   Chronic anemia, likely anemia of chronic disease secondary to above - Hemoglobin stable continue to follow repeat labs, no signs or symptoms of bleeding   Orthostatic hypotension - Continue midodrine  - Multiple medications discontinued as below given concern for low blood pressure  Discharge Instructions  Discharge Instructions     Call MD for:  difficulty breathing, headache or visual disturbances   Complete by: As directed    Call MD for:  extreme fatigue   Complete by: As directed     Call MD for:  hives   Complete by: As directed    Call MD for:  persistant dizziness or light-headedness   Complete by: As directed    Call MD for:  persistant nausea and vomiting   Complete by: As directed    Call MD for:  severe uncontrolled pain   Complete by: As directed    Call MD for:  temperature >100.4   Complete by: As directed    Diet - low sodium heart healthy   Complete by: As directed    Increase activity slowly   Complete by: As directed       Allergies as of 04/19/2024       Reactions   Adhesive [tape]    Latex, band aids  causes whelts and blisters Dermabond causes, welts   Psyllium Other (See Comments)   Severe constipation   Spironolactone  Other (See Comments)   Decreased urine output   Trulicity [dulaglutide]    KIDNEY INFECTION,INABILITY TO HAVE BOWEL MOVEMENTS,BLOOD IN URINE   Latex Other (See Comments)   Band-aids causes whelts and blisters   Oxycontin [oxycodone Hcl] Other (See Comments)   FELT LIKE HEAD WAS GOING TO EXPLODE in head   Hydrocodone -acetaminophen  Itching   Tolerates small/ infrequent doses   Ciprofloxacin Rash   Yeast infection and a severe rash    Codeine Itching, Rash   Other Palpitations   Steroids      makes me feel like I'm having a heart attack. -        Medication List     STOP taking these medications    metoprolol  tartrate 25 MG tablet Commonly known as: LOPRESSOR    tizanidine 2 MG capsule Commonly known as: ZANAFLEX   traMADol  50  MG tablet Commonly known as: ULTRAM    traZODone 50 MG tablet Commonly known as: DESYREL       TAKE these medications    acetaminophen  500 MG tablet Commonly known as: TYLENOL  Take 1,000 mg by mouth every 6 (six) hours as needed for moderate pain.   albuterol  108 (90 Base) MCG/ACT inhaler Commonly known as: VENTOLIN  HFA Inhale 2 puffs into the lungs every 4 (four) hours as needed for wheezing or shortness of breath.   alendronate 10 MG tablet Commonly known as:  FOSAMAX Take 10 mg by mouth once a week. Take with a full glass of water on an empty stomach.   ergocalciferol 1.25 MG (50000 UT) capsule Commonly known as: VITAMIN D2 Take 50,000 Units by mouth every Tuesday.   lactulose  10 GM/15ML solution Commonly known as: CHRONULAC  Take 45 mLs (30 g total) by mouth 4 (four) times daily. What changed:  how much to take how to take this when to take this additional instructions   levothyroxine  137 MCG tablet Commonly known as: SYNTHROID  Take 137 mcg by mouth daily before breakfast.   lidocaine  5 % Commonly known as: LIDODERM  Place 1 patch onto the skin daily as needed (Knee pain). Remove & Discard patch within 12 hours or as directed by MD   linaclotide  290 MCG Caps capsule Commonly known as: Linzess  Take 1 capsule (290 mcg total) by mouth daily before breakfast.   midodrine  5 MG tablet Commonly known as: PROAMATINE  Take 1 tablet (5 mg total) by mouth 3 (three) times daily with meals.   nitrofurantoin 100 MG capsule Commonly known as: MACRODANTIN Take 100 mg by mouth daily.   nitroGLYCERIN  0.4 MG SL tablet Commonly known as: NITROSTAT  Place 1 tablet (0.4 mg total) under the tongue every 5 (five) minutes as needed for chest pain.   omeprazole  40 MG capsule Commonly known as: PRILOSEC Take 1 capsule (40 mg total) by mouth daily.   polyethylene glycol 17 g packet Commonly known as: MIRALAX  / GLYCOLAX  Take 17 g by mouth daily.   potassium chloride  SA 20 MEQ tablet Commonly known as: KLOR-CON  M Take 1 tablet (20 mEq total) by mouth daily. Start taking on: April 20, 2024 What changed:  how much to take how to take this when to take this additional instructions   rifaximin 550 MG Tabs tablet Commonly known as: XIFAXAN Take 1 tablet (550 mg total) by mouth 2 (two) times daily.   rosuvastatin  5 MG tablet Commonly known as: CRESTOR  Take 1 tablet (5 mg total) by mouth daily.   spironolactone  25 MG tablet Commonly known  as: ALDACTONE  Take 1 tablet (25 mg total) by mouth daily. Start taking on: April 20, 2024   Torsemide  40 MG Tabs Take 40 mg by mouth daily at 8 pm. What changed:  when to take this additional instructions        Allergies  Allergen Reactions   Adhesive [Tape]     Latex, band aids  causes whelts and blisters   Dermabond causes, welts   Psyllium Other (See Comments)    Severe constipation   Spironolactone  Other (See Comments)    Decreased urine output   Trulicity [Dulaglutide]     KIDNEY INFECTION,INABILITY TO HAVE BOWEL MOVEMENTS,BLOOD IN URINE   Latex Other (See Comments)    Band-aids causes whelts and blisters   Oxycontin [Oxycodone Hcl] Other (See Comments)    FELT LIKE HEAD WAS GOING TO EXPLODE in head   Hydrocodone -Acetaminophen  Itching    Tolerates small/  infrequent doses   Ciprofloxacin Rash    Yeast infection and a severe rash    Codeine Itching and Rash   Other Palpitations    Steroids      makes me feel like I'm having a heart attack. -    Consultations: None  Procedures/Studies: US  ABDOMEN LIMITED WITH LIVER DOPPLER Result Date: 04/16/2024 CLINICAL DATA:  Fatty liver. EXAM: DUPLEX ULTRASOUND OF LIVER TECHNIQUE: Color and duplex Doppler ultrasound was performed to evaluate the hepatic in-flow and out-flow vessels. COMPARISON:  CT abdomen pelvis 04/15/2024 FINDINGS: Liver: Diffusely coarsened.  Nodular contours. No focal lesion, mass or intrahepatic biliary ductal dilatation. Main Portal Vein size: 0.8 cm Portal Vein Velocities Main Prox:  6 cm/sec Main Mid: 8 cm/sec Main Dist: Not visualized Right: 8 cm/sec Left: 5 cm/sec Hepatic Vein Velocities Right:  18 cm/sec Middle:  15 cm/sec Left:  11 cm/sec IVC: Present and patent with normal respiratory phasicity. Hepatic Artery Velocity:  59 cm/sec Splenic Vein Velocity:  17 cm/sec Spleen: 13.5 cm x 4.2 cm x 4.4 cm with a total volume of 131 cm^3 (411 cm^3 is upper limit normal) Portal Vein Occlusion/Thrombus:  No Splenic Vein Occlusion/Thrombus: No Ascites: None Varices: None IMPRESSION: 1.  Cirrhotic liver morphology without focal hepatic lesion. 2. Patent portal vein with hepatopetal flow. Velocities within the portal vein are decreased, likely due to competitive flow through the splenorenal shunt. Electronically Signed   By: Aliene Lloyd M.D.   On: 04/16/2024 08:39   CT ABDOMEN PELVIS W CONTRAST Result Date: 04/15/2024 CLINICAL DATA:  Abdomen pain EXAM: CT ABDOMEN AND PELVIS WITH CONTRAST TECHNIQUE: Multidetector CT imaging of the abdomen and pelvis was performed using the standard protocol following bolus administration of intravenous contrast. RADIATION DOSE REDUCTION: This exam was performed according to the departmental dose-optimization program which includes automated exposure control, adjustment of the mA and/or kV according to patient size and/or use of iterative reconstruction technique. CONTRAST:  75mL OMNIPAQUE  IOHEXOL  350 MG/ML SOLN COMPARISON:  CT 04/08/2023, 04/13/2021 FINDINGS: Lower chest: Lung bases demonstrate small right and trace left pleural effusions. Partial atelectasis in the right greater than left lower lobes. Cardiomegaly with aortic valve prosthesis and mild coronary vascular calcification. Lower sternotomy Hepatobiliary: Cirrhotic morphology of the liver. Chronic calcifications at the periphery of the right hepatic lobe. Cholecystectomy. No biliary dilatation Pancreas: Atrophic.  No inflammation Spleen: Upper normal in size measuring 12.5 cm on coronal views. Adrenals/Urinary Tract: Adrenal glands are normal. Kidneys show no hydronephrosis. Scarring left kidney with a cyst, no imaging follow-up is recommended. Bladder is unremarkable Stomach/Bowel: The stomach is within normal limits. No dilated small bowel. No acute bowel wall thickening. Few colon diverticula. Vascular/Lymphatic: Moderate aortic atherosclerosis. No aneurysm. No suspicious lymph nodes. Large left abdominal varices are  again noted. Reproductive: Hysterectomy.  No adnexal mass Other: No free air. Small volume free fluid in the pelvis. Mild generalized subcutaneous edema Musculoskeletal: No acute osseous abnormality. IMPRESSION: 1. No CT evidence for acute intra-abdominal or pelvic abnormality. 2. Cirrhotic morphology of the liver findings suspicious for portal hypertension including large left abdominal varices and small volume free fluid in the pelvis. 3. Small right and trace left pleural effusions with partial atelectasis in the right greater than left lower lobes. 4. Aortic atherosclerosis. Aortic Atherosclerosis (ICD10-I70.0). Electronically Signed   By: Luke Bun M.D.   On: 04/15/2024 15:53   DG Chest Port 1 View Result Date: 04/15/2024 EXAM: 1 VIEW(S) XRAY OF THE CHEST 04/15/2024 01:02:00 PM COMPARISON: 11/12/2023 CLINICAL HISTORY:  AMS (altered mental status) FINDINGS: LUNGS AND PLEURA: Chronic elevation of the left hemidiaphragm with left basilar opacity, favor to reflect atelectasis. No overt pulmonary edema. No sizeable pleural effusion. No pneumothorax. HEART AND MEDIASTINUM: Mild cardiomegaly. Prosthetic aortic valve. Median sternotomy. BONES AND SOFT TISSUES: No acute osseous abnormality. IMPRESSION: 1. Chronic elevation of the left hemidiaphragm with left basilar opacity, favored to reflect atelectasis. 2. Mild cardiomegaly. Electronically signed by: Shahmeer Lateef MD 04/15/2024 01:30 PM EST RP Workstation: [id number]   CT Head Wo Contrast Result Date: 04/15/2024 EXAM: CT HEAD WITHOUT CONTRAST 04/15/2024 12:43:00 PM TECHNIQUE: CT of the head was performed without the administration of intravenous contrast. Automated exposure control, iterative reconstruction, and/or weight based adjustment of the mA/kV was utilized to reduce the radiation dose to as low as reasonably achievable. COMPARISON: None available. CLINICAL HISTORY: Altered mental status. FINDINGS: BRAIN AND VENTRICLES: There is no evidence of an  acute infarct, intracranial hemorrhage, mass, midline shift, hydrocephalus, or extra-axial fluid collection. Cerebral volume is normal. Cerebral white matter hypodensities are nonspecific but compatible with mild chronic small vessel ischemic disease. Calcified atherosclerosis at the skull base. ORBITS: Bilateral cataract extraction. SINUSES: No acute abnormality. SOFT TISSUES AND SKULL: No acute soft tissue abnormality. No skull fracture. IMPRESSION: 1. No acute intracranial abnormality. 2. Mild chronic small vessel ischemic disease. Electronically signed by: Dasie Hamburg MD 04/15/2024 01:11 PM EST RP Workstation: [id number]     Subjective: No acute issues or events overnight denies nausea vomiting diarrhea constipation headache fever chills or chest pain   Discharge Exam: Vitals:   04/18/24 2131 04/19/24 0642  BP: 113/64 129/63  Pulse: 64 69  Resp: 17 16  Temp: 98.4 F (36.9 C) 98.5 F (36.9 C)  SpO2: 98% 98%   Vitals:   04/18/24 0439 04/18/24 1346 04/18/24 2131 04/19/24 0642  BP: (!) 110/56 (!) 109/56 113/64 129/63  Pulse: 73 70 64 69  Resp: 18 16 17 16   Temp: 98.3 F (36.8 C) 98.1 F (36.7 C) 98.4 F (36.9 C) 98.5 F (36.9 C)  TempSrc: Oral Oral Oral Oral  SpO2: 97% 99% 98% 98%  Weight:      Height:        General: Pt is alert, awake, not in acute distress Cardiovascular: RRR, S1/S2 +, no rubs, no gallops Respiratory: CTA bilaterally, no wheezing, no rhonchi Abdominal: Soft, NT, ND, bowel sounds + Extremities: no edema, no cyanosis    The results of significant diagnostics from this hospitalization (including imaging, microbiology, ancillary and laboratory) are listed below for reference.     Microbiology: Recent Results (from the past 240 hours)  Blood culture (routine x 2)     Status: None (Preliminary result)   Collection Time: 04/15/24  4:40 PM   Specimen: BLOOD RIGHT ARM  Result Value Ref Range Status   Specimen Description BLOOD RIGHT ARM  Final   Special  Requests   Final    BOTTLES DRAWN AEROBIC AND ANAEROBIC Blood Culture results may not be optimal due to an inadequate volume of blood received in culture bottles   Culture   Final    NO GROWTH 4 DAYS Performed at [facility], [address] [place], [place] [address]    Report Status PENDING  Incomplete  Blood culture (routine x 2)     Status: None (Preliminary result)   Collection Time: 04/15/24  5:02 PM   Specimen: BLOOD  Result Value Ref Range Status   Specimen Description BLOOD SITE NOT SPECIFIED  Final  Special Requests   Final    BOTTLES DRAWN AEROBIC AND ANAEROBIC Blood Culture adequate volume   Culture   Final    NO GROWTH 4 DAYS Performed at [facility], [address] [place], [place] [address]    Report Status PENDING  Incomplete     Labs: BNP (last 3 results) Recent Labs    07/05/23 1306 11/12/23 1616  BNP 39.9 52.4   Basic Metabolic Panel: Recent Labs  Lab 04/15/24 1236 04/16/24 0421 04/17/24 0425 04/18/24 0423 04/19/24 0655  NA 140 144 143 141 142  K 3.4* 3.2* 3.8 3.7 3.7  CL 106 110 110 108 106  CO2 22 25 26 26 27   GLUCOSE 139* 109* 75 76 80  BUN 13 14 12 15 17   CREATININE 1.33* 1.06* 1.01* 1.29* 1.07*  CALCIUM  9.7 9.6 9.1 9.3 10.0   Liver Function Tests: Recent Labs  Lab 04/15/24 1236 04/16/24 0421 04/17/24 0425 04/18/24 0423 04/19/24 0655  AST 51* 47* 46* 52* 62*  ALT 20 17 15 18 21   ALKPHOS 159* 128* 106 117 135*  BILITOT 2.5* 2.0* 1.4* 1.4* 1.7*  PROT 5.8* 4.8* 4.2* 4.4* 5.1*  ALBUMIN  3.2* 3.1* 2.6* 2.8* 3.1*   No results for input(s): LIPASE, AMYLASE in the last 168 hours. Recent Labs  Lab 04/15/24 1236 04/17/24 1033 04/18/24 0423 04/19/24 0655  AMMONIA 81* 99* 31 26   CBC: Recent Labs  Lab 04/15/24 1236 04/16/24 0421 04/17/24 0425 04/19/24 0655  WBC 5.9 4.7 3.7* 4.0  NEUTROABS 4.6  --   --   --   HGB 12.8 10.5* 9.5* 11.1*  HCT 37.9 31.2* 28.8* 33.5*  MCV 92.0 92.3 95.0 94.6  PLT 54* 41* 43* 50*    Cardiac Enzymes: No results for input(s): CKTOTAL, CKMB, CKMBINDEX, TROPONINI in the last 168 hours. BNP: Invalid input(s): POCBNP CBG: Recent Labs  Lab 04/18/24 1144 04/18/24 1621 04/18/24 2124 04/19/24 0738 04/19/24 1157  GLUCAP 81 89 101* 77 130*   D-Dimer No results for input(s): DDIMER in the last 72 hours. Hgb A1c No results for input(s): HGBA1C in the last 72 hours. Lipid Profile No results for input(s): CHOL, HDL, LDLCALC, TRIG, CHOLHDL, LDLDIRECT in the last 72 hours. Thyroid  function studies No results for input(s): TSH, T4TOTAL, T3FREE, THYROIDAB in the last 72 hours.  Invalid input(s): FREET3 Anemia work up No results for input(s): VITAMINB12, FOLATE, FERRITIN, TIBC, IRON, RETICCTPCT in the last 72 hours. Urinalysis    Component Value Date/Time   COLORURINE AMBER (A) 11/12/2023 1700   APPEARANCEUR TURBID (A) 11/12/2023 1700   LABSPEC 1.025 11/12/2023 1700   PHURINE 5.0 11/12/2023 1700   GLUCOSEU NEGATIVE 11/12/2023 1700   HGBUR MODERATE (A) 11/12/2023 1700   BILIRUBINUR NEGATIVE 11/12/2023 1700   KETONESUR NEGATIVE 11/12/2023 1700   PROTEINUR NEGATIVE 11/12/2023 1700   NITRITE NEGATIVE 11/12/2023 1700   LEUKOCYTESUR NEGATIVE 11/12/2023 1700   Sepsis Labs Recent Labs  Lab 04/15/24 1236 04/16/24 0421 04/17/24 0425 04/19/24 0655  WBC 5.9 4.7 3.7* 4.0   Microbiology Recent Results (from the past 240 hours)  Blood culture (routine x 2)     Status: None (Preliminary result)   Collection Time: 04/15/24  4:40 PM   Specimen: BLOOD RIGHT ARM  Result Value Ref Range Status   Specimen Description BLOOD RIGHT ARM  Final   Special Requests   Final    BOTTLES DRAWN AEROBIC AND ANAEROBIC Blood Culture results may not be optimal due to an inadequate volume of  blood received in culture bottles   Culture   Final    NO GROWTH 4 DAYS Performed at [facility], [address] [place], [place] [address]     Report Status PENDING  Incomplete  Blood culture (routine x 2)     Status: None (Preliminary result)   Collection Time: 04/15/24  5:02 PM   Specimen: BLOOD  Result Value Ref Range Status   Specimen Description BLOOD SITE NOT SPECIFIED  Final   Special Requests   Final    BOTTLES DRAWN AEROBIC AND ANAEROBIC Blood Culture adequate volume   Culture   Final    NO GROWTH 4 DAYS Performed at [facility], [address], [place], [place] [address]    Report Status PENDING  Incomplete     Time coordinating discharge: Over 30 minutes  SIGNED:   Elsie JAYSON [place], DO [facility] 04/19/2024, 2:06 PM Pager   If 7PM-7AM, please contact night-coverage [email or web address]

## 2024-04-19 NOTE — Plan of Care (Signed)
  Problem: Education: Goal: Ability to describe self-care measures that may prevent or decrease complications (Diabetes Survival Skills Education) will improve Outcome: Adequate for Discharge Goal: Individualized Educational Video(s) Outcome: Adequate for Discharge   Problem: Coping: Goal: Ability to adjust to condition or change in health will improve Outcome: Adequate for Discharge   Problem: Health Behavior/Discharge Planning: Goal: Ability to identify and utilize available resources and services will improve Outcome: Adequate for Discharge Goal: Ability to manage health-related needs will improve Outcome: Adequate for Discharge   Problem: Metabolic: Goal: Ability to maintain appropriate glucose levels will improve Outcome: Adequate for Discharge   Problem: Nutritional: Goal: Maintenance of adequate nutrition will improve Outcome: Adequate for Discharge Goal: Progress toward achieving an optimal weight will improve Outcome: Adequate for Discharge   Problem: Skin Integrity: Goal: Risk for impaired skin integrity will decrease Outcome: Adequate for Discharge   Problem: Tissue Perfusion: Goal: Adequacy of tissue perfusion will improve Outcome: Adequate for Discharge   Problem: Education: Goal: Knowledge of General Education information will improve Description: Including pain rating scale, medication(s)/side effects and non-pharmacologic comfort measures Outcome: Adequate for Discharge   Problem: Clinical Measurements: Goal: Ability to maintain clinical measurements within normal limits will improve Outcome: Adequate for Discharge Goal: Will remain free from infection Outcome: Adequate for Discharge Goal: Diagnostic test results will improve Outcome: Adequate for Discharge Goal: Respiratory complications will improve Outcome: Adequate for Discharge Goal: Cardiovascular complication will be avoided Outcome: Adequate for Discharge   Problem: Activity: Goal: Risk for  activity intolerance will decrease Outcome: Adequate for Discharge   Problem: Nutrition: Goal: Adequate nutrition will be maintained Outcome: Adequate for Discharge   Problem: Coping: Goal: Level of anxiety will decrease Outcome: Adequate for Discharge   Problem: Elimination: Goal: Will not experience complications related to bowel motility Outcome: Adequate for Discharge Goal: Will not experience complications related to urinary retention Outcome: Adequate for Discharge   Problem: Pain Managment: Goal: General experience of comfort will improve and/or be controlled Outcome: Adequate for Discharge   Problem: Safety: Goal: Ability to remain free from injury will improve Outcome: Adequate for Discharge   Problem: Skin Integrity: Goal: Risk for impaired skin integrity will decrease Outcome: Adequate for Discharge

## 2024-04-20 ENCOUNTER — Other Ambulatory Visit (HOSPITAL_COMMUNITY): Payer: Self-pay

## 2024-04-20 LAB — CULTURE, BLOOD (ROUTINE X 2)
Culture: NO GROWTH
Culture: NO GROWTH
Special Requests: ADEQUATE

## 2024-04-21 ENCOUNTER — Other Ambulatory Visit (HOSPITAL_COMMUNITY): Payer: Self-pay

## 2024-04-21 ENCOUNTER — Other Ambulatory Visit (HOSPITAL_BASED_OUTPATIENT_CLINIC_OR_DEPARTMENT_OTHER): Payer: Self-pay

## 2024-04-22 ENCOUNTER — Other Ambulatory Visit (HOSPITAL_COMMUNITY): Payer: Self-pay

## 2024-04-22 ENCOUNTER — Telehealth (HOSPITAL_COMMUNITY): Payer: Self-pay | Admitting: Pharmacy Technician

## 2024-04-22 NOTE — Telephone Encounter (Signed)
 Mychart message sent to patient. No indication for sooner appt as encephalopathy now controlled on lactulose  and rifaxamin.

## 2024-04-22 NOTE — Telephone Encounter (Signed)
 Pharmacy Patient Advocate Encounter  Received notification from [facility] [employer] that Prior Authorization for Xifaxan 550 mg tablets has been APPROVED from 04/22/2024 to 04/22/2025   PA #/Case ID/Reference #: [id number]

## 2024-04-22 NOTE — Telephone Encounter (Signed)
 Pharmacy Patient Advocate Encounter   Received notification from Fax that prior authorization for Xifaxan 550 mg tablets is required/requested.   Insurance verification completed.   The patient is insured through [facility] [employer].   Per test claim: PA required; PA submitted to above mentioned insurance via Latent Key/confirmation #/EOC [id number] Status is pending

## 2024-04-25 DIAGNOSIS — Z1331 Encounter for screening for depression: Secondary | ICD-10-CM | POA: Diagnosis not present

## 2024-04-25 DIAGNOSIS — K766 Portal hypertension: Secondary | ICD-10-CM | POA: Diagnosis not present

## 2024-04-25 DIAGNOSIS — I5032 Chronic diastolic (congestive) heart failure: Secondary | ICD-10-CM | POA: Diagnosis not present

## 2024-04-25 DIAGNOSIS — Z6841 Body Mass Index (BMI) 40.0 and over, adult: Secondary | ICD-10-CM | POA: Diagnosis not present

## 2024-04-25 DIAGNOSIS — K7460 Unspecified cirrhosis of liver: Secondary | ICD-10-CM | POA: Diagnosis not present

## 2024-04-25 DIAGNOSIS — Z1339 Encounter for screening examination for other mental health and behavioral disorders: Secondary | ICD-10-CM | POA: Diagnosis not present

## 2024-04-25 DIAGNOSIS — Z0000 Encounter for general adult medical examination without abnormal findings: Secondary | ICD-10-CM | POA: Diagnosis not present

## 2024-04-27 NOTE — Progress Notes (Unsigned)
 [facility]  [address] [place],  [place]  [address] [phone]  Clinic Day:  04/28/2024  Referring physician: Ina Marcellus RAMAN, MD   HISTORY OF PRESENT ILLNESS:  The patient is a 68 y.o. female with thrombocytopenia.  She was also found to be iron deficient recently for which she last received IV iron in November 2023.   She comes in today to reassess her peripheral counts.  Since her last visit, the patient has undergone multiple studies, which have actually shown her to have clear-cut evidence of cirrhosis.  Furthermore, she recently presented to the hospital with altered mental status.  After undergoing a thorough workup, her altered mental status was found to be due to hepatic encephalopathy, for which she has now been placed on lactulose  3 times daily.  The concern from the beginning was how a component of her low counts was related to underlying cirrhosis.  With respect to her thrombocytopenia, she denies having any bruising/bleeding issues which concern her for a severely low platelet count being present.  She denies having increased fatigue or any overt forms of blood loss which concern her for having worsening anemia.  PHYSICAL EXAM:  Blood pressure (!) 125/51, pulse 72, temperature 98.3 F (36.8 C), temperature source Oral, resp. rate 16, height 5' 8 (1.727 m), weight 263 lb 1.6 oz (119.3 kg), SpO2 100%. Wt Readings from Last 3 Encounters:  04/28/24 263 lb 1.6 oz (119.3 kg)  04/15/24 262 lb (118.8 kg)  02/07/24 273 lb (123.8 kg)   Body mass index is 40 kg/m. Performance status (ECOG): 2 - Symptomatic, <50% confined to bed Physical Exam Constitutional:      Appearance: Normal appearance. She is obese. She is not ill-appearing.     Comments: She is in a wheelchair  HENT:     Mouth/Throat:     Mouth: Mucous membranes are moist.     Pharynx: Oropharynx is clear. No oropharyngeal exudate or posterior oropharyngeal erythema.  Cardiovascular:      Rate and Rhythm: Normal rate and regular rhythm.     Heart sounds: No murmur heard.    No friction rub. No gallop.  Pulmonary:     Effort: Pulmonary effort is normal. No respiratory distress.     Breath sounds: Normal breath sounds. No wheezing, rhonchi or rales.  Abdominal:     General: Bowel sounds are normal. There is no distension.     Palpations: Abdomen is soft. There is no mass.     Tenderness: There is no abdominal tenderness.  Musculoskeletal:        General: No swelling.     Right lower leg: No edema.     Left lower leg: No edema.  Lymphadenopathy:     Cervical: No cervical adenopathy.     Upper Body:     Right upper body: No supraclavicular or axillary adenopathy.     Left upper body: No supraclavicular or axillary adenopathy.     Lower Body: No right inguinal adenopathy. No left inguinal adenopathy.  Skin:    General: Skin is warm.     Coloration: Skin is not jaundiced.     Findings: No lesion or rash.  Neurological:     General: No focal deficit present.     Mental Status: She is alert and oriented to person, place, and time. Mental status is at baseline.  Psychiatric:        Mood and Affect: Mood normal.  Behavior: Behavior normal.        Thought Content: Thought content normal.    LABS:      Latest Ref Rng & Units 04/28/2024   11:27 AM 04/19/2024    6:55 AM 04/17/2024    4:25 AM  CBC  WBC 4.0 - 10.5 K/uL 3.7  4.0  3.7   Hemoglobin 12.0 - 15.0 g/dL 88.4  88.8  9.5   Hematocrit 36.0 - 46.0 % 33.5  33.5  28.8   Platelets 150 - 400 K/uL 52  50  43       Latest Ref Rng & Units 04/28/2024   11:27 AM 04/19/2024    6:55 AM 04/18/2024    4:23 AM  CMP  Glucose 70 - 99 mg/dL 83  80  76   BUN 8 - 23 mg/dL 14  17  15    Creatinine 0.44 - 1.00 mg/dL 8.95  8.92  8.70   Sodium 135 - 145 mmol/L 140  142  141   Potassium 3.5 - 5.1 mmol/L 3.7  3.7  3.7   Chloride 98 - 111 mmol/L 108  106  108   CO2 22 - 32 mmol/L 23  27  26    Calcium  8.9 - 10.3 mg/dL  9.9  89.9  9.3   Total Protein 6.5 - 8.1 g/dL 5.1  5.1  4.4   Total Bilirubin 0.0 - 1.2 mg/dL 2.0  1.7  1.4   Alkaline Phos 38 - 126 U/L 142  135  117   AST 15 - 41 U/L 96  62  52   ALT 0 - 44 U/L 42  21  18     Latest Reference Range & Units 04/28/24 11:27  Iron 28 - 170 ug/dL 769 (H)  UIBC ug/dL 63  TIBC 749 - 549 ug/dL 706  Saturation Ratios 10.4 - 31.8 % 79 (H)  Ferritin 11 - 307 ng/mL 260  (H): Data is abnormally high  STUDIES:  US  ABDOMEN LIMITED WITH LIVER DOPPLER Result Date: 04/16/2024 CLINICAL DATA:  Fatty liver. EXAM: DUPLEX ULTRASOUND OF LIVER TECHNIQUE: Color and duplex Doppler ultrasound was performed to evaluate the hepatic in-flow and out-flow vessels. COMPARISON:  CT abdomen pelvis 04/15/2024 FINDINGS: Liver: Diffusely coarsened.  Nodular contours. No focal lesion, mass or intrahepatic biliary ductal dilatation. Main Portal Vein size: 0.8 cm Portal Vein Velocities Main Prox:  6 cm/sec Main Mid: 8 cm/sec Main Dist: Not visualized Right: 8 cm/sec Left: 5 cm/sec Hepatic Vein Velocities Right:  18 cm/sec Middle:  15 cm/sec Left:  11 cm/sec IVC: Present and patent with normal respiratory phasicity. Hepatic Artery Velocity:  59 cm/sec Splenic Vein Velocity:  17 cm/sec Spleen: 13.5 cm x 4.2 cm x 4.4 cm with a total volume of 131 cm^3 (411 cm^3 is upper limit normal) Portal Vein Occlusion/Thrombus: No Splenic Vein Occlusion/Thrombus: No Ascites: None Varices: None IMPRESSION: 1.  Cirrhotic liver morphology without focal hepatic lesion. 2. Patent portal vein with hepatopetal flow. Velocities within the portal vein are decreased, likely due to competitive flow through the splenorenal shunt. Electronically Signed   By: Aliene Lloyd M.D.   On: 04/16/2024 08:39   CT ABDOMEN PELVIS W CONTRAST Result Date: 04/15/2024 CLINICAL DATA:  Abdomen pain EXAM: CT ABDOMEN AND PELVIS WITH CONTRAST TECHNIQUE: Multidetector CT imaging of the abdomen and pelvis was performed using the standard protocol  following bolus administration of intravenous contrast. RADIATION DOSE REDUCTION: This exam was performed according to the departmental dose-optimization program which includes automated  exposure control, adjustment of the mA and/or kV according to patient size and/or use of iterative reconstruction technique. CONTRAST:  75mL OMNIPAQUE  IOHEXOL  350 MG/ML SOLN COMPARISON:  CT 04/08/2023, 04/13/2021 FINDINGS: Lower chest: Lung bases demonstrate small right and trace left pleural effusions. Partial atelectasis in the right greater than left lower lobes. Cardiomegaly with aortic valve prosthesis and mild coronary vascular calcification. Lower sternotomy Hepatobiliary: Cirrhotic morphology of the liver. Chronic calcifications at the periphery of the right hepatic lobe. Cholecystectomy. No biliary dilatation Pancreas: Atrophic.  No inflammation Spleen: Upper normal in size measuring 12.5 cm on coronal views. Adrenals/Urinary Tract: Adrenal glands are normal. Kidneys show no hydronephrosis. Scarring left kidney with a cyst, no imaging follow-up is recommended. Bladder is unremarkable Stomach/Bowel: The stomach is within normal limits. No dilated small bowel. No acute bowel wall thickening. Few colon diverticula. Vascular/Lymphatic: Moderate aortic atherosclerosis. No aneurysm. No suspicious lymph nodes. Large left abdominal varices are again noted. Reproductive: Hysterectomy.  No adnexal mass Other: No free air. Small volume free fluid in the pelvis. Mild generalized subcutaneous edema Musculoskeletal: No acute osseous abnormality. IMPRESSION: 1. No CT evidence for acute intra-abdominal or pelvic abnormality. 2. Cirrhotic morphology of the liver findings suspicious for portal hypertension including large left abdominal varices and small volume free fluid in the pelvis. 3. Small right and trace left pleural effusions with partial atelectasis in the right greater than left lower lobes. 4. Aortic atherosclerosis. Aortic  Atherosclerosis (ICD10-I70.0). Electronically Signed   By: Luke Bun M.D.   On: 04/15/2024 15:53   DG Chest Port 1 View Result Date: 04/15/2024 EXAM: 1 VIEW(S) XRAY OF THE CHEST 04/15/2024 01:02:00 PM COMPARISON: 11/12/2023 CLINICAL HISTORY: AMS (altered mental status) FINDINGS: LUNGS AND PLEURA: Chronic elevation of the left hemidiaphragm with left basilar opacity, favor to reflect atelectasis. No overt pulmonary edema. No sizeable pleural effusion. No pneumothorax. HEART AND MEDIASTINUM: Mild cardiomegaly. Prosthetic aortic valve. Median sternotomy. BONES AND SOFT TISSUES: No acute osseous abnormality. IMPRESSION: 1. Chronic elevation of the left hemidiaphragm with left basilar opacity, favored to reflect atelectasis. 2. Mild cardiomegaly. Electronically signed by: Shahmeer Lateef MD 04/15/2024 01:30 PM EST RP Workstation: [id number]   CT Head Wo Contrast Result Date: 04/15/2024 EXAM: CT HEAD WITHOUT CONTRAST 04/15/2024 12:43:00 PM TECHNIQUE: CT of the head was performed without the administration of intravenous contrast. Automated exposure control, iterative reconstruction, and/or weight based adjustment of the mA/kV was utilized to reduce the radiation dose to as low as reasonably achievable. COMPARISON: None available. CLINICAL HISTORY: Altered mental status. FINDINGS: BRAIN AND VENTRICLES: There is no evidence of an acute infarct, intracranial hemorrhage, mass, midline shift, hydrocephalus, or extra-axial fluid collection. Cerebral volume is normal. Cerebral white matter hypodensities are nonspecific but compatible with mild chronic small vessel ischemic disease. Calcified atherosclerosis at the skull base. ORBITS: Bilateral cataract extraction. SINUSES: No acute abnormality. SOFT TISSUES AND SKULL: No acute soft tissue abnormality. No skull fracture. IMPRESSION: 1. No acute intracranial abnormality. 2. Mild chronic small vessel ischemic disease. Electronically signed by: Dasie Hamburg MD 04/15/2024  01:11 PM EST RP Workstation: [id number]     ASSESSMENT & PLAN:  Assessment/Plan:  A 68 y.o. female with thrombocytopenia and a history of iron deficiency anemia.  Her labs today are consistent with her having pancytopenia, which is undoubtedly due to her baseline cirrhosis.  However, her peripheral counts today are not much different than what they have been in the past.  I am pleased as her iron parameters today do not suggest any evidence of  iron deficiency anemia being present.  The patient clearly understands that she has underlying cirrhosis, which led to her recent diagnosis of hepatic encephalopathy.  In clinic today, she requested to be seen by a hepatologist at [facility] to reassess her cirrhosis, and what can be done for management moving forward.  We will place a referral to that facility, per her request.  Otherwise, I will see this patient back in 6 months for repeat clinical assessment.  The patient understands all the plans discussed today and is in agreement with them.    Winni Ehrhard DELENA Kerns, MD

## 2024-04-28 ENCOUNTER — Telehealth: Payer: Self-pay | Admitting: Oncology

## 2024-04-28 ENCOUNTER — Inpatient Hospital Stay: Admitting: Oncology

## 2024-04-28 ENCOUNTER — Inpatient Hospital Stay: Attending: Oncology

## 2024-04-28 ENCOUNTER — Other Ambulatory Visit: Payer: Self-pay

## 2024-04-28 ENCOUNTER — Other Ambulatory Visit: Payer: Self-pay | Admitting: Oncology

## 2024-04-28 ENCOUNTER — Telehealth: Payer: Self-pay

## 2024-04-28 VITALS — BP 125/51 | HR 72 | Temp 98.3°F | Resp 16 | Ht 68.0 in | Wt 263.1 lb

## 2024-04-28 DIAGNOSIS — D696 Thrombocytopenia, unspecified: Secondary | ICD-10-CM | POA: Insufficient documentation

## 2024-04-28 DIAGNOSIS — D509 Iron deficiency anemia, unspecified: Secondary | ICD-10-CM | POA: Diagnosis not present

## 2024-04-28 DIAGNOSIS — R1024 Suprapubic pain: Secondary | ICD-10-CM | POA: Diagnosis not present

## 2024-04-28 DIAGNOSIS — R351 Nocturia: Secondary | ICD-10-CM | POA: Diagnosis not present

## 2024-04-28 DIAGNOSIS — R600 Localized edema: Secondary | ICD-10-CM | POA: Diagnosis not present

## 2024-04-28 DIAGNOSIS — R339 Retention of urine, unspecified: Secondary | ICD-10-CM | POA: Diagnosis not present

## 2024-04-28 DIAGNOSIS — N952 Postmenopausal atrophic vaginitis: Secondary | ICD-10-CM | POA: Diagnosis not present

## 2024-04-28 DIAGNOSIS — N390 Urinary tract infection, site not specified: Secondary | ICD-10-CM | POA: Diagnosis not present

## 2024-04-28 LAB — CBC WITH DIFFERENTIAL (CANCER CENTER ONLY)
Abs Immature Granulocytes: 0 K/uL (ref 0.00–0.07)
Basophils Absolute: 0.1 K/uL (ref 0.0–0.1)
Basophils Relative: 2 %
Eosinophils Absolute: 0.2 K/uL (ref 0.0–0.5)
Eosinophils Relative: 7 %
HCT: 33.5 % — ABNORMAL LOW (ref 36.0–46.0)
Hemoglobin: 11.5 g/dL — ABNORMAL LOW (ref 12.0–15.0)
Immature Granulocytes: 0 %
Lymphocytes Relative: 35 %
Lymphs Abs: 1.3 K/uL (ref 0.7–4.0)
MCH: 31.7 pg (ref 26.0–34.0)
MCHC: 34.3 g/dL (ref 30.0–36.0)
MCV: 92.3 fL (ref 80.0–100.0)
Monocytes Absolute: 0.4 K/uL (ref 0.1–1.0)
Monocytes Relative: 10 %
Neutro Abs: 1.7 K/uL (ref 1.7–7.7)
Neutrophils Relative %: 46 %
Platelet Count: 52 K/uL — ABNORMAL LOW (ref 150–400)
RBC: 3.63 MIL/uL — ABNORMAL LOW (ref 3.87–5.11)
RDW: 15.1 % (ref 11.5–15.5)
WBC Count: 3.7 K/uL — ABNORMAL LOW (ref 4.0–10.5)
nRBC: 0 % (ref 0.0–0.2)

## 2024-04-28 LAB — IRON AND TIBC
Iron: 230 ug/dL — ABNORMAL HIGH (ref 28–170)
Saturation Ratios: 79 % — ABNORMAL HIGH (ref 10.4–31.8)
TIBC: 293 ug/dL (ref 250–450)
UIBC: 63 ug/dL

## 2024-04-28 LAB — CMP (CANCER CENTER ONLY)
ALT: 42 U/L (ref 0–44)
AST: 96 U/L — ABNORMAL HIGH (ref 15–41)
Albumin: 3.1 g/dL — ABNORMAL LOW (ref 3.5–5.0)
Alkaline Phosphatase: 142 U/L — ABNORMAL HIGH (ref 38–126)
Anion gap: 10 (ref 5–15)
BUN: 14 mg/dL (ref 8–23)
CO2: 23 mmol/L (ref 22–32)
Calcium: 9.9 mg/dL (ref 8.9–10.3)
Chloride: 108 mmol/L (ref 98–111)
Creatinine: 1.04 mg/dL — ABNORMAL HIGH (ref 0.44–1.00)
GFR, Estimated: 58 mL/min — ABNORMAL LOW (ref >60)
Glucose, Bld: 83 mg/dL (ref 70–99)
Potassium: 3.7 mmol/L (ref 3.5–5.1)
Sodium: 140 mmol/L (ref 135–145)
Total Bilirubin: 2 mg/dL — ABNORMAL HIGH (ref 0.0–1.2)
Total Protein: 5.1 g/dL — ABNORMAL LOW (ref 6.5–8.1)

## 2024-04-28 LAB — AMMONIA: Ammonia: 23 umol/L (ref 9–35)

## 2024-04-28 LAB — FERRITIN: Ferritin: 260 ng/mL (ref 11–307)

## 2024-04-28 NOTE — Progress Notes (Unsigned)
 [facility]  [address] [place],  [place]  [address] [phone]  Clinic Day:  04/28/2024  Referring physician: No ref. provider found   HISTORY OF PRESENT ILLNESS:  The patient is a 68 y.o. female with thrombocytopenia.  She was also found to be iron deficient recently for which she last received IV iron in November 2023.   She comes in today to reassess her hemoglobin and low platelets.  Since her last visit, the patient has been doing okay.  From an anemia perspective, she denies having increased fatigue or any overt forms of blood loss.  She continues to deny having any underlying bleeding/bruising issues which concern her for worsening thrombocytopenia being present.  Of note, a CT scan done this month showed morphologic changes in her liver that were suspicious for cirrhosis.  Here was no evidence of splenomegaly.     PHYSICAL EXAM:  There were no vitals taken for this visit. Wt Readings from Last 3 Encounters:  04/15/24 262 lb (118.8 kg)  02/07/24 273 lb (123.8 kg)  01/02/24 260 lb (117.9 kg)   There is no height or weight on file to calculate BMI. Performance status (ECOG): 2 - Symptomatic, <50% confined to bed Physical Exam Constitutional:      Appearance: Normal appearance. She is obese. She is not ill-appearing.     Comments: She is in a wheelchair  HENT:     Mouth/Throat:     Mouth: Mucous membranes are moist.     Pharynx: Oropharynx is clear. No oropharyngeal exudate or posterior oropharyngeal erythema.  Cardiovascular:     Rate and Rhythm: Normal rate and regular rhythm.     Heart sounds: No murmur heard.    No friction rub. No gallop.  Pulmonary:     Effort: Pulmonary effort is normal. No respiratory distress.     Breath sounds: Normal breath sounds. No wheezing, rhonchi or rales.  Abdominal:     General: Bowel sounds are normal. There is no distension.     Palpations: Abdomen is soft. There is no mass.     Tenderness:  There is no abdominal tenderness.  Musculoskeletal:        General: No swelling.     Right lower leg: No edema.     Left lower leg: No edema.  Lymphadenopathy:     Cervical: No cervical adenopathy.     Upper Body:     Right upper body: No supraclavicular or axillary adenopathy.     Left upper body: No supraclavicular or axillary adenopathy.     Lower Body: No right inguinal adenopathy. No left inguinal adenopathy.  Skin:    General: Skin is warm.     Coloration: Skin is not jaundiced.     Findings: No lesion or rash.  Neurological:     General: No focal deficit present.     Mental Status: She is alert and oriented to person, place, and time. Mental status is at baseline.  Psychiatric:        Mood and Affect: Mood normal.        Behavior: Behavior normal.        Thought Content: Thought content normal.     LABS:      Latest Ref Rng & Units 04/19/2024    6:55 AM 04/17/2024    4:25 AM 04/16/2024    4:21 AM  CBC  WBC 4.0 - 10.5 K/uL 4.0  3.7  4.7   Hemoglobin 12.0 - 15.0  g/dL 88.8  9.5  89.4   Hematocrit 36.0 - 46.0 % 33.5  28.8  31.2   Platelets 150 - 400 K/uL 50  43  41       Latest Ref Rng & Units 04/19/2024    6:55 AM 04/18/2024    4:23 AM 04/17/2024    4:25 AM  CMP  Glucose 70 - 99 mg/dL 80  76  75   BUN 8 - 23 mg/dL 17  15  12    Creatinine 0.44 - 1.00 mg/dL 8.92  8.70  8.98   Sodium 135 - 145 mmol/L 142  141  143   Potassium 3.5 - 5.1 mmol/L 3.7  3.7  3.8   Chloride 98 - 111 mmol/L 106  108  110   CO2 22 - 32 mmol/L 27  26  26    Calcium  8.9 - 10.3 mg/dL 89.9  9.3  9.1   Total Protein 6.5 - 8.1 g/dL 5.1  4.4  4.2   Total Bilirubin 0.0 - 1.2 mg/dL 1.7  1.4  1.4   Alkaline Phos 38 - 126 U/L 135  117  106   AST 15 - 41 U/L 62  52  46   ALT 0 - 44 U/L 21  18  15      Latest Reference Range & Units 04/17/23 10:16  Iron 42 - 145 ug/dL 855  TIBC 749.9 - 549.9 mcg/dL 718.5  Saturation Ratios 20.0 - 50.0 % 51.2 (H)  Ferritin 10.0 - 291.0 ng/mL 110.3  Transferrin  212.0 - 360.0 mg/dL 798.9 (L)  (H): Data is abnormally high (L): Data is abnormally low   STUDIES:  US  ABDOMEN LIMITED WITH LIVER DOPPLER Result Date: 04/16/2024 CLINICAL DATA:  Fatty liver. EXAM: DUPLEX ULTRASOUND OF LIVER TECHNIQUE: Color and duplex Doppler ultrasound was performed to evaluate the hepatic in-flow and out-flow vessels. COMPARISON:  CT abdomen pelvis 04/15/2024 FINDINGS: Liver: Diffusely coarsened.  Nodular contours. No focal lesion, mass or intrahepatic biliary ductal dilatation. Main Portal Vein size: 0.8 cm Portal Vein Velocities Main Prox:  6 cm/sec Main Mid: 8 cm/sec Main Dist: Not visualized Right: 8 cm/sec Left: 5 cm/sec Hepatic Vein Velocities Right:  18 cm/sec Middle:  15 cm/sec Left:  11 cm/sec IVC: Present and patent with normal respiratory phasicity. Hepatic Artery Velocity:  59 cm/sec Splenic Vein Velocity:  17 cm/sec Spleen: 13.5 cm x 4.2 cm x 4.4 cm with a total volume of 131 cm^3 (411 cm^3 is upper limit normal) Portal Vein Occlusion/Thrombus: No Splenic Vein Occlusion/Thrombus: No Ascites: None Varices: None IMPRESSION: 1.  Cirrhotic liver morphology without focal hepatic lesion. 2. Patent portal vein with hepatopetal flow. Velocities within the portal vein are decreased, likely due to competitive flow through the splenorenal shunt. Electronically Signed   By: Aliene Lloyd M.D.   On: 04/16/2024 08:39   CT ABDOMEN PELVIS W CONTRAST Result Date: 04/15/2024 CLINICAL DATA:  Abdomen pain EXAM: CT ABDOMEN AND PELVIS WITH CONTRAST TECHNIQUE: Multidetector CT imaging of the abdomen and pelvis was performed using the standard protocol following bolus administration of intravenous contrast. RADIATION DOSE REDUCTION: This exam was performed according to the departmental dose-optimization program which includes automated exposure control, adjustment of the mA and/or kV according to patient size and/or use of iterative reconstruction technique. CONTRAST:  75mL OMNIPAQUE  IOHEXOL  350  MG/ML SOLN COMPARISON:  CT 04/08/2023, 04/13/2021 FINDINGS: Lower chest: Lung bases demonstrate small right and trace left pleural effusions. Partial atelectasis in the right greater than left lower lobes. Cardiomegaly  with aortic valve prosthesis and mild coronary vascular calcification. Lower sternotomy Hepatobiliary: Cirrhotic morphology of the liver. Chronic calcifications at the periphery of the right hepatic lobe. Cholecystectomy. No biliary dilatation Pancreas: Atrophic.  No inflammation Spleen: Upper normal in size measuring 12.5 cm on coronal views. Adrenals/Urinary Tract: Adrenal glands are normal. Kidneys show no hydronephrosis. Scarring left kidney with a cyst, no imaging follow-up is recommended. Bladder is unremarkable Stomach/Bowel: The stomach is within normal limits. No dilated small bowel. No acute bowel wall thickening. Few colon diverticula. Vascular/Lymphatic: Moderate aortic atherosclerosis. No aneurysm. No suspicious lymph nodes. Large left abdominal varices are again noted. Reproductive: Hysterectomy.  No adnexal mass Other: No free air. Small volume free fluid in the pelvis. Mild generalized subcutaneous edema Musculoskeletal: No acute osseous abnormality. IMPRESSION: 1. No CT evidence for acute intra-abdominal or pelvic abnormality. 2. Cirrhotic morphology of the liver findings suspicious for portal hypertension including large left abdominal varices and small volume free fluid in the pelvis. 3. Small right and trace left pleural effusions with partial atelectasis in the right greater than left lower lobes. 4. Aortic atherosclerosis. Aortic Atherosclerosis (ICD10-I70.0). Electronically Signed   By: Luke Bun M.D.   On: 04/15/2024 15:53   DG Chest Port 1 View Result Date: 04/15/2024 EXAM: 1 VIEW(S) XRAY OF THE CHEST 04/15/2024 01:02:00 PM COMPARISON: 11/12/2023 CLINICAL HISTORY: AMS (altered mental status) FINDINGS: LUNGS AND PLEURA: Chronic elevation of the left hemidiaphragm with  left basilar opacity, favor to reflect atelectasis. No overt pulmonary edema. No sizeable pleural effusion. No pneumothorax. HEART AND MEDIASTINUM: Mild cardiomegaly. Prosthetic aortic valve. Median sternotomy. BONES AND SOFT TISSUES: No acute osseous abnormality. IMPRESSION: 1. Chronic elevation of the left hemidiaphragm with left basilar opacity, favored to reflect atelectasis. 2. Mild cardiomegaly. Electronically signed by: Shahmeer Lateef MD 04/15/2024 01:30 PM EST RP Workstation: [id number]   CT Head Wo Contrast Result Date: 04/15/2024 EXAM: CT HEAD WITHOUT CONTRAST 04/15/2024 12:43:00 PM TECHNIQUE: CT of the head was performed without the administration of intravenous contrast. Automated exposure control, iterative reconstruction, and/or weight based adjustment of the mA/kV was utilized to reduce the radiation dose to as low as reasonably achievable. COMPARISON: None available. CLINICAL HISTORY: Altered mental status. FINDINGS: BRAIN AND VENTRICLES: There is no evidence of an acute infarct, intracranial hemorrhage, mass, midline shift, hydrocephalus, or extra-axial fluid collection. Cerebral volume is normal. Cerebral white matter hypodensities are nonspecific but compatible with mild chronic small vessel ischemic disease. Calcified atherosclerosis at the skull base. ORBITS: Bilateral cataract extraction. SINUSES: No acute abnormality. SOFT TISSUES AND SKULL: No acute soft tissue abnormality. No skull fracture. IMPRESSION: 1. No acute intracranial abnormality. 2. Mild chronic small vessel ischemic disease. Electronically signed by: Dasie Hamburg MD 04/15/2024 01:11 PM EST RP Workstation: [id number]     ASSESSMENT & PLAN:  Assessment/Plan:  A 68 y.o. female with thrombocytopenia and a history of iron deficiency anemia.  Her platelets are mildly lower than what they have been previously.  However, she has more than enough platelets to do the clotting her body needs.  If she has cirrhosis, this could be the  ultimate etiology behind her thrombocytopenia.  She apparently is going to undergo a GI workup in the near future.  With respect to her iron deficiency anemia, although lower, her hemoglobin remains normal.  Furthermore, her iron parameters remain normal.  For now, all of her hematologic parameters will be followed conservatively.  I will see her back in 6 months for repeat clinical assessment.  The patient understands all  the plans discussed today and is in agreement with them.    Samuel Mcpeek DELENA Kerns, MD

## 2024-04-28 NOTE — Telephone Encounter (Signed)
 Labs still in process @ 1523.

## 2024-04-28 NOTE — Telephone Encounter (Signed)
 Patient has been scheduled for follow-up visit per 04/28/2024 LOS.  Pt given an appt calendar with date and time.

## 2024-04-29 ENCOUNTER — Other Ambulatory Visit: Payer: Self-pay | Admitting: Physician Assistant

## 2024-04-29 ENCOUNTER — Encounter: Payer: Self-pay | Admitting: Oncology

## 2024-05-05 ENCOUNTER — Ambulatory Visit: Attending: Cardiology | Admitting: Cardiology

## 2024-05-05 ENCOUNTER — Encounter: Payer: Self-pay | Admitting: Cardiology

## 2024-05-05 VITALS — BP 112/68 | HR 68 | Resp 14 | Ht 68.0 in | Wt 266.8 lb

## 2024-05-05 DIAGNOSIS — E66813 Obesity, class 3: Secondary | ICD-10-CM

## 2024-05-05 DIAGNOSIS — N1831 Chronic kidney disease, stage 3a: Secondary | ICD-10-CM | POA: Diagnosis not present

## 2024-05-05 DIAGNOSIS — I951 Orthostatic hypotension: Secondary | ICD-10-CM | POA: Diagnosis not present

## 2024-05-05 DIAGNOSIS — I5032 Chronic diastolic (congestive) heart failure: Secondary | ICD-10-CM

## 2024-05-05 DIAGNOSIS — Z6841 Body Mass Index (BMI) 40.0 and over, adult: Secondary | ICD-10-CM

## 2024-05-05 NOTE — Patient Instructions (Signed)
 Medication Instructions:  Your physician recommends that you continue on your current medications as directed. Please refer to the Current Medication list given to you today.  *If you need a refill on your cardiac medications before your next appointment, please call your pharmacy*  Lab Work: NONE If you have labs (blood work) drawn today and your tests are completely normal, you will receive your results only by: MyChart Message (if you have MyChart) OR A paper copy in the mail If you have any lab test that is abnormal or we need to change your treatment, we will call you to review the results.  Testing/Procedures: NONE  Follow-Up: At [facility], you and your health needs are our priority.  As part of our continuing mission to provide you with exceptional heart care, our providers are all part of one team.  This team includes your primary Cardiologist (physician) and Advanced Practice Providers or APPs (Physician Assistants and Nurse Practitioners) who all work together to provide you with the care you need, when you need it.  Your next appointment:   6 months  Provider:   [place], MD  We recommend signing up for the patient portal called MyChart.  Sign up information is provided on this After Visit Summary.  MyChart is used to connect with patients for Virtual Visits (Telemedicine).  Patients are able to view lab/test results, encounter notes, upcoming appointments, etc.  Non-urgent messages can be sent to your provider as well.   To learn more about what you can do with MyChart, go to [email or web address].

## 2024-05-05 NOTE — Progress Notes (Signed)
 Cardiology Office Note:  .   Date:  05/05/2024  ID:  Joanna Alvarado, DOB November 19, 1955, MRN [id number] PCP: Ina Marcellus RAMAN, MD  [facility] Providers Cardiologist:  Gordy Bergamo, MD   History of Present Illness: .   Joanna Alvarado is a 68 y.o. past medical history of hypertension with stage IIIa chronic kidney disease, severe aortic regurgitation S/P BIOPROSTHETIC AV replacement 11/24/2021, chronic thrombocytopenia, morbid obesity and OSA on CPAP, GERD, hiatal hernia, hypercholesterolemia, hypothyroidism and advanced liver disease with mild cirrhosis of liver due to fatty liver and chronic hypotension for which she is on midodrine . She is not on SGLT2 inhibitors due to recurrent UTI.   Due to diuretic use, she has had significant issues with hypokalemia as well.  Cardiac catheterization on 11/08/2021 revealed normal coronary arteries and normal right heart pressures and EDP was mild to moderately elevated at 21 mmHg.  She has also had nuclear stress test on 06/25/2023 which was nonischemic with EF 55%.  Appointment made by the hospital and she was admitted with altered mental status due to hepatic encephalopathy on 04/15/2024.  She was started back on spironolactone .  She is presently doing well from cardiac standpoint without leg edema, change in weight and remains stable.    Discussed the use of AI scribe software for clinical note transcription with the patient, who gave verbal consent to proceed.  History of Present Illness Joanna Alvarado is a 68 year old female with cirrhosis of the liver and heart failure who presents with dizziness and medication management. The appointment was made when she was in the hospital.  She has chronic dizziness that occurs in all positions. She takes midodrine  three times a day for this.  Her current cardiac-related regimen includes torsemide , two tablets at night, spironolactone  once daily, and a daily potassium supplement. Her potassium has been low but  was recently 3.7 and considered stable.  She was recently hospitalized for four days with altered mental status from hepatic encephalopathy. She is on lactulose  with dose adjustments based on bowel movements.  She reports several days of significant leg pain, worse in one leg, with associated dizziness limiting her ability to stand while cooking. Tylenol  gives minimal relief.  She has mild stage 3A chronic kidney disease with creatinine now back to her baseline of 1.04 after a recent increase during hospitalization.  She is taking an antibiotic from her urologist until the 24th of this month.   Cardiac Studies relevent.    CARDIAC CATHETERIZATION 11/08/2021   MYOCARDIAL PERFUSION IMAGING 07/03/2023    Findings are consistent with no ischemia and no infarction. The study is low risk.   No ST deviation was noted.   LV perfusion is normal. There is no evidence of ischemia. There is no evidence of infarction.  LVEF 55%.  ECHOCARDIOGRAM COMPLETE 06/29/2023  1. Left ventricular ejection fraction, by estimation, is 55%. The left ventricle has normal function. The left ventricle has no regional wall motion abnormalities. There is mild concentric left ventricular hypertrophy. Left ventricular diastolic parameters are consistent with Grade I diastolic dysfunction (impaired relaxation). 2. Right ventricular systolic function is normal. The right ventricular size is normal. There is normal pulmonary artery systolic pressure. The estimated right ventricular systolic pressure is 29.4 mmHg. 3. Left atrial size was mildly dilated. 4. Right atrial size was mildly dilated. 5. The mitral valve is normal in structure. Trivial mitral valve regurgitation. No evidence of mitral stenosis. 6. There is a bioprosthetic aortic valve. Mean gradient  13 mmHg with no significant regurgitation noted. 7. Aortic dilatation noted. There is borderline dilatation of the ascending aorta, measuring 37 mm.   Labs   Lab Results   Component Value Date   CHOL 119 09/30/2019   HDL 37 (L) 09/30/2019   LDLCALC 63 09/30/2019   TRIG 103 09/30/2019   CHOLHDL 3.2 09/30/2019   No results found for: LIPOA  Recent Labs    04/18/24 0423 04/19/24 0655 04/28/24 1127  NA 141 142 140  K 3.7 3.7 3.7  CL 108 106 108  CO2 26 27 23   GLUCOSE 76 80 83  BUN 15 17 14   CREATININE 1.29* 1.07* 1.04*  CALCIUM  9.3 10.0 9.9  GFRNONAA 45* 56* 58*    Lab Results  Component Value Date   ALT 42 04/28/2024   AST 96 (H) 04/28/2024   ALKPHOS 142 (H) 04/28/2024   BILITOT 2.0 (H) 04/28/2024      Latest Ref Rng & Units 04/28/2024   11:27 AM 04/19/2024    6:55 AM 04/17/2024    4:25 AM  CBC  WBC 4.0 - 10.5 K/uL 3.7  4.0  3.7   Hemoglobin 12.0 - 15.0 g/dL 88.4  88.8  9.5   Hematocrit 36.0 - 46.0 % 33.5  33.5  28.8   Platelets 150 - 400 K/uL 52  50  43    Lab Results  Component Value Date   HGBA1C 4.9 11/22/2021    Lab Results  Component Value Date   TSH 11.704 (H) 04/15/2024    ROS  Review of Systems  Cardiovascular:  Negative for chest pain, dyspnea on exertion and leg swelling.  Neurological:  Positive for dizziness.   Physical Exam:   VS:  BP 112/68 (BP Location: Left Arm, Patient Position: Sitting, Cuff Size: Large)   Pulse 68   Resp 14   Ht 5' 8 (1.727 m)   Wt 266 lb 12.8 oz (121 kg)   SpO2 98%   BMI 40.57 kg/m    Wt Readings from Last 3 Encounters:  05/05/24 266 lb 12.8 oz (121 kg)  04/28/24 263 lb 1.6 oz (119.3 kg)  04/15/24 262 lb (118.8 kg)    BP Readings from Last 3 Encounters:  05/05/24 112/68  04/28/24 (!) 125/51  04/19/24 129/63   Physical Exam Constitutional:      Appearance: She is obese.  Neck:     Vascular: No JVD.  Cardiovascular:     Rate and Rhythm: Normal rate and regular rhythm.     Pulses: Intact distal pulses.     Heart sounds: S1 normal and S2 normal. Murmur heard.     Early systolic murmur is present with a grade of 2/6 at the upper right sternal border.     No gallop.   Pulmonary:     Effort: Pulmonary effort is normal.     Breath sounds: Normal breath sounds.  Abdominal:     General: Bowel sounds are normal.     Palpations: Abdomen is soft.  Musculoskeletal:     Right lower leg: No edema.     Left lower leg: No edema.    EKG:         ASSESSMENT AND PLAN: .      ICD-10-CM   1. Chronic diastolic heart failure (HCC)  P49.67     2. Orthostatic hypotension  I95.1     3. Class 3 severe obesity due to excess calories with serious comorbidity and body mass index (BMI) of 40.0 to  44.9 in adult [facility])  Z33.186    Z68.41      Assessment & Plan Chronic diastolic heart failure Well-managed with current medications. Blood pressure is low, limiting the use of certain heart failure medications. Torsemide  and spironolactone  are effective in managing symptoms and preventing fluid accumulation in the liver. - Continue torsemide  and spironolactone . - Potassium levels have normalized, she had chronic hypokalemia prior to starting spironolactone .  Bioprosthetic aortic valve Echocardiogram from January 2024 revealing normally functioning prosthetic aortic valve with normal LVEF and grade 1 diastolic dysfunction.  Orthostatic hypotension Persists with dizziness ron standing.  - Continue midodrine  three times a day.  Class 3 severe obesity Weight loss is ongoing, which is beneficial for overall health and management of comorbid conditions. - Encouraged continued weight loss efforts.  Stage 3a chronic kidney disease Well-managed with creatinine levels at baseline. No change in therapy is needed. - Continue current management.   Follow up: 6 month for orthostatic hypotension, prosthetic AV, chronic diastolic heart failure Signed,  Gordy Bergamo, MD, [facility] 05/05/2024, 11:01 AM [facility] [address] [place], [place] [address] Phone: [phone]. Fax:  [phone]

## 2024-05-06 ENCOUNTER — Telehealth: Payer: Self-pay | Admitting: Internal Medicine

## 2024-05-06 MED ORDER — LACTULOSE ENCEPHALOPATHY 10 GM/15ML PO SOLN: 30.0000 g | Freq: Three times a day (TID) | ORAL | 1 refills | Status: AC

## 2024-05-06 NOTE — Telephone Encounter (Signed)
 Refill sent to pharmacy.

## 2024-05-06 NOTE — Telephone Encounter (Signed)
 Inbound call from patient requesting refill for Lactulose . Requesting it be sent to [facility] on [address] Please advise.

## 2024-05-26 ENCOUNTER — Telehealth: Payer: Self-pay | Admitting: Cardiology

## 2024-05-26 MED ORDER — SPIRONOLACTONE 25 MG PO TABS: 25.0000 mg | ORAL_TABLET | Freq: Every day | ORAL | 3 refills | Status: AC

## 2024-05-26 NOTE — Telephone Encounter (Signed)
 Refill sent

## 2024-05-26 NOTE — Telephone Encounter (Signed)
" °*  STAT* If patient is at the pharmacy, call can be transferred to refill team.   1. Which medications need to be refilled? (please list name of each medication and dose if known) spironolactone  (ALDACTONE ) 25 MG tablet   2. Which pharmacy/location (including street and city if local pharmacy) is medication to be sent to? [facility] [id number] - [place], [place] - [address]   3. Do they need a 30 day or 90 day supply? 90  "

## 2024-06-17 ENCOUNTER — Other Ambulatory Visit: Payer: Self-pay | Admitting: Cardiology

## 2024-06-18 MED ORDER — MIDODRINE HCL 5 MG PO TABS: 5.0000 mg | ORAL_TABLET | Freq: Three times a day (TID) | ORAL | 3 refills | Status: DC

## 2024-06-18 NOTE — Telephone Encounter (Signed)
 In accordance with refill protocols, please review and address the following requirements before this medication refill can be authorized:  Labs

## 2024-06-26 ENCOUNTER — Other Ambulatory Visit: Payer: Self-pay

## 2024-06-26 ENCOUNTER — Telehealth: Payer: Self-pay

## 2024-06-26 ENCOUNTER — Inpatient Hospital Stay: Attending: Oncology

## 2024-06-26 DIAGNOSIS — R1030 Lower abdominal pain, unspecified: Secondary | ICD-10-CM

## 2024-06-26 DIAGNOSIS — K7469 Other cirrhosis of liver: Secondary | ICD-10-CM

## 2024-06-26 LAB — CMP (CANCER CENTER ONLY)
ALT: 17 U/L (ref 0–44)
AST: 41 U/L (ref 15–41)
Albumin: 3.2 g/dL — ABNORMAL LOW (ref 3.5–5.0)
Alkaline Phosphatase: 126 U/L (ref 38–126)
Anion gap: 13 (ref 5–15)
BUN: 14 mg/dL (ref 8–23)
CO2: 21 mmol/L — ABNORMAL LOW (ref 22–32)
Calcium: 9.9 mg/dL (ref 8.9–10.3)
Chloride: 108 mmol/L (ref 98–111)
Creatinine: 1.42 mg/dL — ABNORMAL HIGH (ref 0.44–1.00)
GFR, Estimated: 40 mL/min — ABNORMAL LOW
Glucose, Bld: 114 mg/dL — ABNORMAL HIGH (ref 70–99)
Potassium: 3.5 mmol/L (ref 3.5–5.1)
Sodium: 143 mmol/L (ref 135–145)
Total Bilirubin: 2.5 mg/dL — ABNORMAL HIGH (ref 0.0–1.2)
Total Protein: 5.1 g/dL — ABNORMAL LOW (ref 6.5–8.1)

## 2024-06-26 LAB — CBC WITH DIFFERENTIAL (CANCER CENTER ONLY)
Abs Immature Granulocytes: 0.1 K/uL — ABNORMAL HIGH (ref 0.00–0.07)
Basophils Absolute: 0.1 K/uL (ref 0.0–0.1)
Basophils Relative: 1 %
Eosinophils Absolute: 0.1 K/uL (ref 0.0–0.5)
Eosinophils Relative: 0 %
HCT: 32.2 % — ABNORMAL LOW (ref 36.0–46.0)
Hemoglobin: 10.7 g/dL — ABNORMAL LOW (ref 12.0–15.0)
Immature Granulocytes: 1 %
Lymphocytes Relative: 14 %
Lymphs Abs: 1.8 K/uL (ref 0.7–4.0)
MCH: 30.9 pg (ref 26.0–34.0)
MCHC: 33.2 g/dL (ref 30.0–36.0)
MCV: 93.1 fL (ref 80.0–100.0)
Monocytes Absolute: 1.1 K/uL — ABNORMAL HIGH (ref 0.1–1.0)
Monocytes Relative: 9 %
Neutro Abs: 9.9 K/uL — ABNORMAL HIGH (ref 1.7–7.7)
Neutrophils Relative %: 75 %
Platelet Count: 64 K/uL — ABNORMAL LOW (ref 150–400)
RBC: 3.46 MIL/uL — ABNORMAL LOW (ref 3.87–5.11)
RDW: 14.9 % (ref 11.5–15.5)
WBC Count: 13 K/uL — ABNORMAL HIGH (ref 4.0–10.5)
nRBC: 0 % (ref 0.0–0.2)

## 2024-06-26 LAB — AMMONIA: Ammonia: 32 umol/L (ref 9–35)

## 2024-06-26 LAB — BILIRUBIN, DIRECT: Bilirubin, Direct: 1.1 mg/dL — ABNORMAL HIGH (ref 0.0–0.2)

## 2024-06-26 NOTE — Telephone Encounter (Signed)
 Pt called to report that she hasn't slept in days, so she took a Unisom. She states she looked it up on the internet to make sure it wouldn't bother the liver. There were no warnings on the box about liver impairment. Anyway, she awakened this morning in the worse pain she has had since she was in hospital in November. She had a lot of gagging @ this time as well, no emesis. The pain has resolved on its own. She can drink fluids and is urinating normal. She is taking her lactulose  TID, Linzess  q am, and Miralax  q am. Her next appt with you isn't until May. She wonders if she needs labs? She has not been seen @ [facility] yet. Tammy sent referral on 06/20/2024. Message sent to Dr Ezzard and Eleanor P,NP.

## 2024-06-27 NOTE — Telephone Encounter (Signed)
"   Latest Reference Range & Units 06/26/24 15:07  Sodium 135 - 145 mmol/L 143  Potassium 3.5 - 5.1 mmol/L 3.5  Chloride 98 - 111 mmol/L 108  CO2 22 - 32 mmol/L 21 (L)  Glucose 70 - 99 mg/dL 885 (H)  BUN 8 - 23 mg/dL 14  Creatinine 9.55 - 8.99 mg/dL 8.57 (H)  Calcium  8.9 - 10.3 mg/dL 9.9  Anion gap 5 - 15  13  Alkaline Phosphatase 38 - 126 U/L 126  Albumin  3.5 - 5.0 g/dL 3.2 (L)  AST 15 - 41 U/L 41  ALT 0 - 44 U/L 17  Total Protein 6.5 - 8.1 g/dL 5.1 (L)  Ammonia 9 - 35 umol/L 32  Bilirubin, Direct 0.0 - 0.2 mg/dL 1.1 (H)  Total Bilirubin 0.0 - 1.2 mg/dL 2.5 (H)  GFR, Est Non African American >60 mL/min 40 (L)  WBC 4.0 - 10.5 K/uL 13.0 (H)  RBC 3.87 - 5.11 MIL/uL 3.46 (L)  Hemoglobin 12.0 - 15.0 g/dL 89.2 (L)  HCT 63.9 - 53.9 % 32.2 (L)  MCV 80.0 - 100.0 fL 93.1  MCH 26.0 - 34.0 pg 30.9  MCHC 30.0 - 36.0 g/dL 66.7  RDW 88.4 - 84.4 % 14.9  Platelets 150 - 400 K/uL 64 (L)  nRBC 0.0 - 0.2 % 0.0  Neutrophils % 75  Lymphocytes % 14  Monocytes Relative % 9  Eosinophil % 0  Basophil % 1  Immature Granulocytes % 1  NEUT# 1.7 - 7.7 K/uL 9.9 (H)  Lymphs Abs 0.7 - 4.0 K/uL 1.8  Monocyte # 0.1 - 1.0 K/uL 1.1 (H)  Eosinophils Absolute 0.0 - 0.5 K/uL 0.1  Basophils Absolute 0.0 - 0.1 K/uL 0.1  Abs Immature Granulocytes 0.00 - 0.07 K/uL 0.10 (H)  (L): Data is abnormally low (H): Data is abnormally high "

## 2024-06-27 NOTE — Telephone Encounter (Signed)
 Pt is not on any type of steroid. Denies any s/s infection. States she hasn't been out of her home since Dec 2026.

## 2024-07-01 ENCOUNTER — Other Ambulatory Visit: Payer: Self-pay

## 2024-07-01 MED ORDER — MIDODRINE HCL 5 MG PO TABS: 5.0000 mg | ORAL_TABLET | Freq: Three times a day (TID) | ORAL | 3 refills | Status: AC

## 2024-10-28 ENCOUNTER — Inpatient Hospital Stay: Admitting: Oncology

## 2024-10-28 ENCOUNTER — Inpatient Hospital Stay

## 2024-11-06 ENCOUNTER — Ambulatory Visit: Admitting: Cardiology
